# Patient Record
Sex: Female | Born: 1935 | Race: White | Hispanic: No | Smoking: Never smoker
Health system: Southern US, Community
[De-identification: ages and names within clinical notes are randomized; demographics above are authoritative.]

## PROBLEM LIST (undated history)

## (undated) DIAGNOSIS — K5792 Diverticulitis of intestine, part unspecified, without perforation or abscess without bleeding: Secondary | ICD-10-CM

## (undated) DIAGNOSIS — I4891 Unspecified atrial fibrillation: Secondary | ICD-10-CM

## (undated) DIAGNOSIS — F419 Anxiety disorder, unspecified: Secondary | ICD-10-CM

## (undated) DIAGNOSIS — I10 Essential (primary) hypertension: Secondary | ICD-10-CM

## (undated) DIAGNOSIS — Z124 Encounter for screening for malignant neoplasm of cervix: Secondary | ICD-10-CM

## (undated) DIAGNOSIS — Z0000 Encounter for general adult medical examination without abnormal findings: Secondary | ICD-10-CM

## (undated) DIAGNOSIS — F418 Other specified anxiety disorders: Secondary | ICD-10-CM

## (undated) DIAGNOSIS — Z9889 Other specified postprocedural states: Secondary | ICD-10-CM

## (undated) DIAGNOSIS — M1990 Unspecified osteoarthritis, unspecified site: Secondary | ICD-10-CM

## (undated) DIAGNOSIS — K5900 Constipation, unspecified: Secondary | ICD-10-CM

## (undated) DIAGNOSIS — M1712 Unilateral primary osteoarthritis, left knee: Secondary | ICD-10-CM

## (undated) DIAGNOSIS — D649 Anemia, unspecified: Secondary | ICD-10-CM

## (undated) DIAGNOSIS — E785 Hyperlipidemia, unspecified: Secondary | ICD-10-CM

## (undated) DIAGNOSIS — R112 Nausea with vomiting, unspecified: Secondary | ICD-10-CM

## (undated) DIAGNOSIS — R011 Cardiac murmur, unspecified: Secondary | ICD-10-CM

## (undated) DIAGNOSIS — E039 Hypothyroidism, unspecified: Secondary | ICD-10-CM

## (undated) HISTORY — DX: Encounter for general adult medical examination without abnormal findings: Z00.00

## (undated) HISTORY — PX: JOINT REPLACEMENT: SHX530

## (undated) HISTORY — DX: Cardiac murmur, unspecified: R01.1

## (undated) HISTORY — DX: Unspecified atrial fibrillation: I48.91

## (undated) HISTORY — PX: TONSILLECTOMY: SUR1361

## (undated) HISTORY — DX: Unilateral primary osteoarthritis, left knee: M17.12

## (undated) HISTORY — PX: GANGLION CYST EXCISION: SHX1691

## (undated) HISTORY — DX: Other specified anxiety disorders: F41.8

## (undated) HISTORY — PX: ABDOMINAL HYSTERECTOMY: SHX81

## (undated) HISTORY — DX: Constipation, unspecified: K59.00

## (undated) HISTORY — DX: Encounter for screening for malignant neoplasm of cervix: Z12.4

## (undated) HISTORY — DX: Hyperlipidemia, unspecified: E78.5

## (undated) HISTORY — DX: Essential (primary) hypertension: I10

---

## 1999-10-22 ENCOUNTER — Encounter (INDEPENDENT_AMBULATORY_CARE_PROVIDER_SITE_OTHER): Payer: Self-pay

## 1999-10-22 ENCOUNTER — Ambulatory Visit (HOSPITAL_COMMUNITY): Admission: RE | Admit: 1999-10-22 | Discharge: 1999-10-22 | Payer: Self-pay | Admitting: Gastroenterology

## 2002-10-18 ENCOUNTER — Encounter: Payer: Self-pay | Admitting: Internal Medicine

## 2002-10-18 ENCOUNTER — Encounter: Admission: RE | Admit: 2002-10-18 | Discharge: 2002-10-18 | Payer: Self-pay | Admitting: Internal Medicine

## 2003-03-18 ENCOUNTER — Encounter: Admission: RE | Admit: 2003-03-18 | Discharge: 2003-03-18 | Payer: Self-pay | Admitting: Internal Medicine

## 2003-08-22 ENCOUNTER — Encounter: Admission: RE | Admit: 2003-08-22 | Discharge: 2003-08-22 | Payer: Self-pay | Admitting: Internal Medicine

## 2004-01-22 ENCOUNTER — Encounter: Admission: RE | Admit: 2004-01-22 | Discharge: 2004-04-21 | Payer: Self-pay | Admitting: Internal Medicine

## 2004-01-22 ENCOUNTER — Ambulatory Visit: Payer: Self-pay | Admitting: Psychology

## 2004-03-12 ENCOUNTER — Encounter: Admission: RE | Admit: 2004-03-12 | Discharge: 2004-03-12 | Payer: Self-pay | Admitting: Internal Medicine

## 2004-12-16 ENCOUNTER — Ambulatory Visit (HOSPITAL_COMMUNITY): Admission: RE | Admit: 2004-12-16 | Discharge: 2004-12-16 | Payer: Self-pay | Admitting: Gastroenterology

## 2006-01-01 ENCOUNTER — Encounter: Admission: RE | Admit: 2006-01-01 | Discharge: 2006-01-01 | Payer: Self-pay | Admitting: Otolaryngology

## 2006-06-22 ENCOUNTER — Ambulatory Visit: Payer: Self-pay | Admitting: Vascular Surgery

## 2009-06-10 ENCOUNTER — Ambulatory Visit: Payer: Self-pay | Admitting: Diagnostic Radiology

## 2009-06-10 ENCOUNTER — Emergency Department (HOSPITAL_BASED_OUTPATIENT_CLINIC_OR_DEPARTMENT_OTHER): Admission: EM | Admit: 2009-06-10 | Discharge: 2009-06-10 | Payer: Self-pay | Admitting: Emergency Medicine

## 2010-03-15 ENCOUNTER — Encounter: Payer: Self-pay | Admitting: Sports Medicine

## 2010-04-23 HISTORY — PX: OTHER SURGICAL HISTORY: SHX169

## 2010-06-16 ENCOUNTER — Other Ambulatory Visit (HOSPITAL_COMMUNITY): Payer: Self-pay

## 2010-06-19 ENCOUNTER — Other Ambulatory Visit (HOSPITAL_COMMUNITY): Payer: Self-pay

## 2010-07-10 NOTE — Procedures (Signed)
[facility]  Patient:    GRACELYNN, BIRCHER                          MRN: [id number] Proc. Date: 10/22/99 Attending:  Verlin Grills, M.D. CC:         Barbette Hair. Vaughan Basta., M.D.   Procedure Report  PROCEDURE:  Colonoscopy and colon polyp biopsy.  REFERRING PHYSICIAN:  Dr. Ike Bene.  INDICATION FOR PROCEDURE:  Ms. Shariah Assad is a 76 year old female who is referred for screening colonoscopy to rule out colorectal neoplasia.  I discussed with the patient the complications associated with colonoscopy and polypectomy including a 15/1000 risk of bleeding and 05/998 risk of intestinal perforation requiring surgical repair. Ms. Bejarano has signed the operative permit.  ENDOSCOPIST:  Verlin Grills, M.D.  PREMEDICATION:  Versed 10 mg, Demerol 50 mg.  ENDOSCOPE:  Olympus pediatric colonoscope.  DESCRIPTION OF PROCEDURE:  After obtaining informed consent, the patient was placed in the left lateral decubitus position. I administered intravenous Demerol and intravenous Versed to achieve conscious sedation for the procedure. The patients cardiac rhythm, oxygen saturation and blood pressure were monitored throughout the procedure and documented in the medical record.  Anal inspection was normal. Digital rectal exam was normal. The Olympus pediatric video colonoscope was introduced into the rectum and under direct vision advanced to the cecum as identified by a normal appearing ileocecal valve. Colonic preparation for the exam today was excellent.  RECTUM:  Normal.  SIGMOID COLON/DESCENDING COLON:  Normal.  SPLENIC FLEXURE:  A 1 mm sessile polyp was removed with the cold biopsy forceps from the splenic flexure and submitted for pathological interpretation.  TRANSVERSE COLON:  Normal.  HEPATIC FLEXURE:  Normal.  ASCENDING COLON:  Normal.  CECUM/ILEOCECAL VALVE:  Normal.  ASSESSMENT:  A 1 mm sessile polyp was removed with the cold  biopsy forceps from the splenic flexure and submitted for pathological interpretation. Otherwise normal proctocolonoscopy to the cecum.  RECOMMENDATIONS:  If splenic flexure polyp returns neoplastic, Ms. Kuba should undergo a repeat colonoscopy in 5 years. If the splenic flexure polyp returns non-neoplastic, Ms. Sittner should undergo a repeat colonoscopy in 10 years. DD:  10/22/99 TD:  10/22/99 Job: [id number]

## 2010-07-10 NOTE — Op Note (Signed)
[name], Kalisz [name]                 ACCOUNT NO.:  [id number]   MEDICAL RECORD NO.:  [id number]          PATIENT TYPE:  AMB   LOCATION:  ENDO                         FACILITY:  [facility]   PHYSICIAN:  Danise Edge, M.D.   DATE OF BIRTH:  May 14, 1931   DATE OF PROCEDURE:  12/16/2004  DATE OF DISCHARGE:                                 OPERATIVE REPORT   PROCEDURE:  Surveillance colonoscopy.   PROCEDURE INDICATIONS:  [name] is a 77 year old female born [birth year]. Five years ago [name] underwent a screening colonoscopy and a 1  mm adenomatous polyp was removed.   ENDOSCOPIST:  Danise Edge, M.D.   PREMEDICATION:  Versed 7 mg, Demerol 50 mg.   PROCEDURE:  After obtaining informed consent, [name] was placed in the  left lateral decubitus position. I administered intravenous Demerol and  intravenous Versed to achieve conscious sedation for the procedure. The  patient's blood pressure, oxygen saturation and cardiac rhythm were  monitored throughout the procedure and documented in the medical record.   Anal inspection and digital rectal exam were normal. The Olympus adjustable  pediatric colonoscope was introduced into the rectum and advanced to the  cecum. A normal-appearing ileocecal valve and appendiceal orifice were  identified. Colonic preparation for the exam today was excellent.   RECTUM:  Normal. Retroflex view of the distal rectum normal.  SIGMOID COLON AND DESCENDING COLON:  Normal.  SPLENIC FLEXURE:  Normal.  TRANSVERSE COLON:  Normal.  HEPATIC FLEXURE:  Normal.  ASCENDING COLON:  Normal.  CECUM AND ILEOCECAL VALVE:  Normal.   ASSESSMENT:  Normal proctocolonoscopy to the cecum. No endoscopic evidence  for the presence of recurrent colorectal neoplasia.   RECOMMENDATIONS:  Repeat colonoscopy in 5 years.           ______________________________  Danise Edge, M.D.     MJ/MEDQ  D:  12/16/2004  T:  12/16/2004  Job:  [id number]

## 2010-08-04 ENCOUNTER — Other Ambulatory Visit (HOSPITAL_COMMUNITY): Payer: Self-pay

## 2010-08-12 ENCOUNTER — Inpatient Hospital Stay (HOSPITAL_COMMUNITY): Admission: RE | Admit: 2010-08-12 | Payer: Self-pay | Source: Ambulatory Visit | Admitting: Orthopedic Surgery

## 2010-10-23 ENCOUNTER — Other Ambulatory Visit (HOSPITAL_COMMUNITY): Payer: Self-pay

## 2010-10-24 HISTORY — PX: REPLACEMENT TOTAL KNEE: SUR1224

## 2010-10-27 ENCOUNTER — Encounter (HOSPITAL_COMMUNITY)
Admission: RE | Admit: 2010-10-27 | Discharge: 2010-10-27 | Disposition: A | Discharge status: Home / Self Care | Payer: Medicare Other | Source: Ambulatory Visit | Attending: Orthopedic Surgery | Admitting: Orthopedic Surgery

## 2010-10-27 ENCOUNTER — Other Ambulatory Visit (HOSPITAL_COMMUNITY): Payer: Self-pay | Admitting: Orthopedic Surgery

## 2010-10-27 ENCOUNTER — Ambulatory Visit (HOSPITAL_COMMUNITY)
Admission: RE | Admit: 2010-10-27 | Discharge: 2010-10-27 | Disposition: A | Discharge status: Home / Self Care | Payer: Medicare Other | Source: Ambulatory Visit | Attending: Orthopedic Surgery | Admitting: Orthopedic Surgery

## 2010-10-27 DIAGNOSIS — Z01811 Encounter for preprocedural respiratory examination: Secondary | ICD-10-CM

## 2010-10-27 LAB — TYPE AND SCREEN: Antibody Screen: NEGATIVE

## 2010-10-27 LAB — COMPREHENSIVE METABOLIC PANEL
ALT: 18 U/L (ref 0–35)
Albumin: 3.8 g/dL (ref 3.5–5.2)
Alkaline Phosphatase: 72 U/L (ref 39–117)
BUN: 10 mg/dL (ref 6–23)
Chloride: 100 mEq/L (ref 96–112)
Potassium: 4.2 mEq/L (ref 3.5–5.1)
Total Bilirubin: 0.9 mg/dL (ref 0.3–1.2)

## 2010-10-27 LAB — URINALYSIS, ROUTINE W REFLEX MICROSCOPIC
Hgb urine dipstick: NEGATIVE
Protein, ur: NEGATIVE mg/dL
Urobilinogen, UA: 0.2 mg/dL (ref 0.0–1.0)

## 2010-10-27 LAB — CBC
HCT: 39.6 % (ref 36.0–46.0)
Hemoglobin: 14 g/dL (ref 12.0–15.0)
MCH: 31.6 pg (ref 26.0–34.0)
MCHC: 35.4 g/dL (ref 30.0–36.0)
Platelets: 236 10*3/uL (ref 150–400)
RBC: 4.43 MIL/uL (ref 3.87–5.11)
WBC: 6.8 10*3/uL (ref 4.0–10.5)

## 2010-10-27 LAB — PROTIME-INR
INR: 0.94 (ref 0.00–1.49)
Prothrombin Time: 12.8 seconds (ref 11.6–15.2)

## 2010-10-27 LAB — URINE MICROSCOPIC-ADD ON

## 2010-10-27 LAB — SURGICAL PCR SCREEN: MRSA, PCR: NEGATIVE

## 2010-10-28 ENCOUNTER — Inpatient Hospital Stay (HOSPITAL_COMMUNITY)
Admission: RE | Admit: 2010-10-28 | Discharge: 2010-10-31 | DRG: 470 | Disposition: A | Discharge status: Skilled Nursing Facility | Payer: Medicare Other | Source: Ambulatory Visit | Attending: Orthopedic Surgery | Admitting: Orthopedic Surgery

## 2010-10-28 ENCOUNTER — Inpatient Hospital Stay (HOSPITAL_COMMUNITY): Payer: Medicare Other

## 2010-10-28 DIAGNOSIS — N390 Urinary tract infection, site not specified: Secondary | ICD-10-CM | POA: Diagnosis present

## 2010-10-28 DIAGNOSIS — I10 Essential (primary) hypertension: Secondary | ICD-10-CM | POA: Diagnosis present

## 2010-10-28 DIAGNOSIS — M1710 Unilateral primary osteoarthritis, unspecified knee: Principal | ICD-10-CM | POA: Diagnosis present

## 2010-10-28 DIAGNOSIS — E039 Hypothyroidism, unspecified: Secondary | ICD-10-CM | POA: Diagnosis present

## 2010-10-28 DIAGNOSIS — F341 Dysthymic disorder: Secondary | ICD-10-CM | POA: Diagnosis present

## 2010-10-28 DIAGNOSIS — Z01812 Encounter for preprocedural laboratory examination: Secondary | ICD-10-CM

## 2010-10-29 LAB — BASIC METABOLIC PANEL
BUN: 8 mg/dL (ref 6–23)
CO2: 30 mEq/L (ref 19–32)
Calcium: 8.3 mg/dL — ABNORMAL LOW (ref 8.4–10.5)
Chloride: 95 mEq/L — ABNORMAL LOW (ref 96–112)
GFR calc Af Amer: >60 mL/min (ref >60)
GFR calc non Af Amer: >60 mL/min (ref >60)
Glucose, Bld: 134 mg/dL — ABNORMAL HIGH (ref 70–99)
Glucose, Bld: 152 mg/dL — ABNORMAL HIGH (ref 70–99)
Potassium: 4 mEq/L (ref 3.5–5.1)
Potassium: 3.9 mEq/L (ref 3.5–5.1)
Sodium: 129 mEq/L — ABNORMAL LOW (ref 135–145)

## 2010-10-29 LAB — CBC
HCT: 31.4 % — ABNORMAL LOW (ref 36.0–46.0)
Hemoglobin: 10.8 g/dL — ABNORMAL LOW (ref 12.0–15.0)
MCH: 30.8 pg (ref 26.0–34.0)
MCV: 89.5 fL (ref 78.0–100.0)
Platelets: 195 10*3/uL (ref 150–400)
RBC: 3.51 MIL/uL — ABNORMAL LOW (ref 3.87–5.11)

## 2010-10-30 LAB — CBC
Hemoglobin: 9.6 g/dL — ABNORMAL LOW (ref 12.0–15.0)
MCH: 31 pg (ref 26.0–34.0)
Platelets: 163 10*3/uL (ref 150–400)
RBC: 3.1 MIL/uL — ABNORMAL LOW (ref 3.87–5.11)
WBC: 8.6 10*3/uL (ref 4.0–10.5)

## 2010-10-30 LAB — BASIC METABOLIC PANEL
BUN: 8 mg/dL (ref 6–23)
Chloride: 94 mEq/L — ABNORMAL LOW (ref 96–112)
GFR calc Af Amer: >60 mL/min (ref >60)
GFR calc non Af Amer: >60 mL/min (ref >60)
Potassium: 3.7 mEq/L (ref 3.5–5.1)
Sodium: 131 mEq/L — ABNORMAL LOW (ref 135–145)

## 2010-10-30 LAB — URINE CULTURE
Colony Count: 30000
Culture  Setup Time: 201209051114

## 2010-10-30 LAB — PROTIME-INR
INR: 2.29 — ABNORMAL HIGH (ref 0.00–1.49)
Prothrombin Time: 25.6 seconds — ABNORMAL HIGH (ref 11.6–15.2)

## 2010-10-31 LAB — CBC
HCT: 27.8 % — ABNORMAL LOW (ref 36.0–46.0)
Hemoglobin: 9.8 g/dL — ABNORMAL LOW (ref 12.0–15.0)
MCHC: 35.3 g/dL (ref 30.0–36.0)
WBC: 8.1 10*3/uL (ref 4.0–10.5)

## 2010-10-31 LAB — BASIC METABOLIC PANEL
Calcium: 8.5 mg/dL (ref 8.4–10.5)
Chloride: 97 mEq/L (ref 96–112)
Creatinine, Ser: 0.58 mg/dL (ref 0.50–1.10)
GFR calc Af Amer: >60 mL/min (ref >60)
Sodium: 135 mEq/L (ref 135–145)

## 2010-10-31 LAB — PROTIME-INR
INR: 1.78 — ABNORMAL HIGH (ref 0.00–1.49)
Prothrombin Time: 21 seconds — ABNORMAL HIGH (ref 11.6–15.2)

## 2010-11-03 NOTE — Op Note (Signed)
NAMELAKAYA, TOLEN                 ACCOUNT NO.:  [id number]  MEDICAL RECORD NO.:  [id number]  LOCATION:  5031                         FACILITY:  [facility]  PHYSICIAN:  Loreta Ave, M.D. DATE OF BIRTH:  [birth year]  DATE OF PROCEDURE:  10/28/2010 DATE OF DISCHARGE:                              OPERATIVE REPORT   PREOPERATIVE DIAGNOSES: 1. 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 2. Left knee end-stage degenerative arthritis with valgus deformity.  POSTOPERATIVE DIAGNOSES: 1. 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 2. Left knee end-stage degenerative arthritis with valgus deformity. 3. Moderate-to-relatively marked osteopenia, proximal tibia on the     right.  PROCEDURE: 1. Right knee modified minimally invasive total knee replacement with     Stryker triathlon prosthesis.  Soft tissue balancing.  Cemented     pegged posterior stabilized #4 femoral component.  Cemented #4     tibial component with a 100-mm x 12-mm stem.  9-mm polyethylene     insert.  Cemented 35-mm patellar component. 2. Left knee intraarticular injection of Depo-Medrol and Marcaine.  SURGEON:  Loreta Ave, MD  ASSISTANT:  Zonia Kief, PA, present throughout the entire case and necessary for timely completion of procedure.  ANESTHESIA:  General  BLOOD LOSS:  Minimal.  SPECIMENS:  None.  CULTURES:  None.  COMPLICATIONS:  None.  PROCEDURE:  Soft compressive.  TOURNIQUET TIME:  1 hour 20 minutes on the right.  Hemovac x1 on the right.  DESCRIPTION OF PROCEDURE:  The patient was brought to the operating room, placed on the operating table in supine position.  After adequate anesthesia by obtained, both knees examined.  Both knees have increased valgus more than 10 degrees without lock correction.  Mild flexion contracture, flexion 100 degrees on both sides.  Under sterile technique, the left knee was injected intra-articularly with  Depo-Medrol and Marcaine.  Attention turned to the right.  Tourniquet applied. Prepped and draped in usual sterile fashion.  Exsanguinated with elevation and  Esmarch, tourniquet inflated to 350 mmHg.  Straight incision above the patella down to the tibial tubercle.  Medial arthrotomy vastus splitting preserving quad tendon.  Knee exposed. Remnants of menisci periarticular spurs, cruciate ligaments excised. Bone loss lateral femoral condyle, lateral tibial plateau producing valgus deformity.  I freed up scar tissue adhesions laterally.  Distal femur exposed.  Intramedullary guide placed.  Distal cut 10 mm set at 5 degrees of valgus.  Using epicondylar axis, the femur was sized, cut, and fitted for posterior stabilized peg #4 component.  Proximal tibial resection extramedullary guide with a 0-degree cut.  The bone in the proximal tibia found to be extremely osteoporotic more so than the femur.  After being sure my cuts were balanced in flexion and extension. I then prepared the tibia for the #4 component.  I put trials above and below the 9-mm insert.  After clearing out all debris and clearing up adhesions laterally and appropriate cuts, this gave me a nicely balanced knee.  Good mechanical axis, good flexion and extension.  Tibia was marked for rotation.  Tibia was then prepared for a keel and a non- cemented 100-mm stem that I utilized  to try to offset forces because the degree of osteopenia.  Once I was prepared, all trials were removed. Patella exposed, posterior 10 mm removed.  Size drilled and fitted for a 35-mm component.  All trials removed.  Copious irrigation with pulse irrigating device.  Cement prepared and placed on all components which were firmly seated.  Polyethylene attached to tibia, knee reduced. Patella held with clamp.  Once cement hardened, we examined the knee. Good alignment, good stability, good patellofemoral tracking, and good balancing, flexion, and extension  confirmed.  Wound irrigated once again.  Hemovac placed and brought out through a separate stab wound. Arthrotomy closed with #1 Vicryl.  Skin and subcutaneous tissue with Vicryl and staples.  Sterile compressive dressing applied.  Tourniquet deinflated and removed.  Knee immobilizer applied.  Anesthesia reversed. Brought to recovery room.  Tolerated the surgery well.  No complications.     Loreta Ave, M.D.     DFM/MEDQ  D:  10/28/2010  T:  10/28/2010  Job:  [id number]  Electronically Signed by Mckinley Jewel M.D. on 11/03/2010 02:47:40 PM

## 2010-12-09 NOTE — Discharge Summary (Signed)
Alyssa Williamson, Alyssa Williamson                 ACCOUNT NO.:  [id number]  MEDICAL RECORD NO.:  [id number]  LOCATION:  5031                         FACILITY:  [facility]  PHYSICIAN:  Loreta Ave, M.D. DATE OF BIRTH:  06/07/1931  DATE OF ADMISSION:  10/28/2010 DATE OF DISCHARGE:  10/31/2010                              DISCHARGE SUMMARY   FINAL DIAGNOSES: 1. Status post right total knee replacement for end-stage degenerative     joint disease. 2. End-stage degenerative joint disease left knee with Marcaine/Depo-     Medrol injection. 3. Hypertension. 4. Depression/anxiety. 5. Hypothyroid. 6. Urinary tract infection.  HISTORY OF PRESENT ILLNESS:  A 77 year old white female with history of bilateral knee pain secondary to end-stage degenerative joint disease right greater left presented to our office for preop evaluation for right total knee replacement.  She had progressive worsening pain with no response with conservative treatment.  Significant decrease in her daily activities due to the ongoing complaint.  HOSPITAL COURSE:  On October 28, 2010, the patient was taken to the [facility] OR and a right total knee replacement and left knee intra- articular Marcaine/Depo-Medrol injection performed.  Surgeon is Loreta Ave, M.D. and assistant is Genene Churn. Denton Meek.  Anesthesia general.  Minimal blood loss.  No specimens or cultures.  One Hemovac drain placed.  No surgical or anesthesia complications and the patient was transferred to recovery in stable condition.  Tourniquet time was 1 hour and 20 minutes.  After the patient was stable in recovery, she was then transferred to the Orthopedic Unit.  On October 29, 2010, the patient was doing well with good pain control. Temp 98.7, pulse 68, respirations 18, and blood pressure 144/56.  Right knee dressing is clean, dry, and intact.  Calf nontender. Neurovascularly intact.  Warm and dry.  Discontinued morphine PCA. PT/OT consults.   Waiting transfer to [facility] skilled nursing facility for rehab.  Pharmacy protocol Coumadin and Lovenox started for DVT prophylaxis.  On October 30, 2010, the patient was doing well, again with good pain control.  She did have some itching with Norco and this was discontinued.  Started Ultram.  Temp 98.9, pulse 71, respirations 18, and blood pressure 125/49.  Urine culture was positive for UTI and Cipro 500 mg 1 tab p.o. b.i.d. started.  The patient was doing well and awaiting transfer to [facility] for rehab.  Right knee wound looks good. Staples intact.  No drainage or sign of infection.  Calf nontender. Neurovascularly intact.  Skin warm and dry.  Sodium 131, potassium 3.7, chloride 94, CO2 of 30, glucose 117, BUN 8, creatinine 0.48, and INR 2.29.  WBC 8.6, hemoglobin 9.6, hematocrit 27.3, and platelets 163.  DISCHARGE MEDICATIONS: 1. Ultram 50 mg 1 tab p.o. q.6 h. for pain. 2. Robaxin 500 mg 1 tab p.o. q.6 h. for spasms. 3. Coumadin pharmacy protocol.  Maintain INR 2-3 times 4 weeks postop     DVT prophylaxis. 4. Amlodipine/atorvastatin 10/40 mg 1 tab p.o. daily. 5. HCTZ 25 mg 1 tab p.o. daily. 6. Claritin 10 mg 1 tab p.o. daily. 7. Citalopram 20 mg 1 tab p.o. daily. 8. Alprazolam 0.5 mg 1 tab p.o. at  bedtime as needed. 9. Colace 100 mg p.o. b.i.d.  CONDITION:  Good and stable.  DISPOSITION:  Transfer to [facility] for rehab.  INSTRUCTIONS:  While at the skilled nursing facility, the patient will continue PT and OT to improve ambulation and knee range of motion and strengthening.  Weightbear as tolerated.  Can wean out of knee immobilizer per protocol.  Wean from walker to a cane as tolerated.  The patient is okay to shower, but no tub soaking.  Apply 4x4 gauze and apply TED hose over this daily.  Do not apply any creams or ointments to her incision.  Continue Coumadin x4 weeks postop for DVT prophylaxis. Should continue Cipro 500 mg 1 tablet p.o. b.i.d. x6 days.  Follow  up with Dr. Eulah Pont when she is 2 weeks postop for recheck and we will remove knee staples at that time.     Genene Churn. Denton Meek.   ______________________________ Loreta Ave, M.D.    JMO/MEDQ  D:  10/30/2010  T:  10/30/2010  Job:  [id number]  Electronically Signed by Zonia Kief P.A. on 12/02/2010 03:45:46 PM Electronically Signed by Mckinley Jewel M.D. on 12/09/2010 02:03:21 PM

## 2011-03-18 DIAGNOSIS — R059 Cough, unspecified: Secondary | ICD-10-CM | POA: Diagnosis not present

## 2011-03-18 DIAGNOSIS — R093 Abnormal sputum: Secondary | ICD-10-CM | POA: Diagnosis not present

## 2011-03-18 DIAGNOSIS — R609 Edema, unspecified: Secondary | ICD-10-CM | POA: Diagnosis not present

## 2011-03-18 DIAGNOSIS — R05 Cough: Secondary | ICD-10-CM | POA: Diagnosis not present

## 2011-03-29 DIAGNOSIS — R3911 Hesitancy of micturition: Secondary | ICD-10-CM | POA: Diagnosis not present

## 2011-03-29 DIAGNOSIS — R609 Edema, unspecified: Secondary | ICD-10-CM | POA: Diagnosis not present

## 2011-04-21 DIAGNOSIS — M24873 Other specific joint derangements of unspecified ankle, not elsewhere classified: Secondary | ICD-10-CM | POA: Diagnosis not present

## 2011-04-22 DIAGNOSIS — E7800 Pure hypercholesterolemia, unspecified: Secondary | ICD-10-CM | POA: Diagnosis not present

## 2011-04-22 DIAGNOSIS — R059 Cough, unspecified: Secondary | ICD-10-CM | POA: Diagnosis not present

## 2011-04-22 DIAGNOSIS — H6120 Impacted cerumen, unspecified ear: Secondary | ICD-10-CM | POA: Diagnosis not present

## 2011-04-22 DIAGNOSIS — R609 Edema, unspecified: Secondary | ICD-10-CM | POA: Diagnosis not present

## 2011-04-22 DIAGNOSIS — I10 Essential (primary) hypertension: Secondary | ICD-10-CM | POA: Diagnosis not present

## 2011-04-22 DIAGNOSIS — R05 Cough: Secondary | ICD-10-CM | POA: Diagnosis not present

## 2011-04-22 DIAGNOSIS — E039 Hypothyroidism, unspecified: Secondary | ICD-10-CM | POA: Diagnosis not present

## 2011-05-07 ENCOUNTER — Other Ambulatory Visit (HOSPITAL_COMMUNITY): Payer: Medicare Other

## 2011-05-07 DIAGNOSIS — R350 Frequency of micturition: Secondary | ICD-10-CM | POA: Diagnosis not present

## 2011-05-07 DIAGNOSIS — N390 Urinary tract infection, site not specified: Secondary | ICD-10-CM | POA: Diagnosis not present

## 2011-05-12 ENCOUNTER — Inpatient Hospital Stay: Admit: 2011-05-12 | Payer: Self-pay | Admitting: Orthopedic Surgery

## 2011-05-12 SURGERY — ARTHROPLASTY, KNEE, TOTAL
Anesthesia: General | Laterality: Left

## 2011-05-19 DIAGNOSIS — N390 Urinary tract infection, site not specified: Secondary | ICD-10-CM | POA: Diagnosis not present

## 2011-05-25 DIAGNOSIS — I10 Essential (primary) hypertension: Secondary | ICD-10-CM | POA: Diagnosis not present

## 2011-05-25 DIAGNOSIS — R609 Edema, unspecified: Secondary | ICD-10-CM | POA: Diagnosis not present

## 2011-05-31 ENCOUNTER — Emergency Department (HOSPITAL_BASED_OUTPATIENT_CLINIC_OR_DEPARTMENT_OTHER)
Admission: EM | Admit: 2011-05-31 | Discharge: 2011-06-01 | Disposition: A | Discharge status: Home / Self Care | Payer: Medicare Other | Attending: Emergency Medicine | Admitting: Emergency Medicine

## 2011-05-31 ENCOUNTER — Encounter (HOSPITAL_BASED_OUTPATIENT_CLINIC_OR_DEPARTMENT_OTHER): Payer: Self-pay | Admitting: *Deleted

## 2011-05-31 ENCOUNTER — Emergency Department (INDEPENDENT_AMBULATORY_CARE_PROVIDER_SITE_OTHER): Payer: Medicare Other

## 2011-05-31 DIAGNOSIS — B349 Viral infection, unspecified: Secondary | ICD-10-CM

## 2011-05-31 DIAGNOSIS — Z8739 Personal history of other diseases of the musculoskeletal system and connective tissue: Secondary | ICD-10-CM | POA: Diagnosis not present

## 2011-05-31 DIAGNOSIS — Z79899 Other long term (current) drug therapy: Secondary | ICD-10-CM | POA: Insufficient documentation

## 2011-05-31 DIAGNOSIS — E039 Hypothyroidism, unspecified: Secondary | ICD-10-CM | POA: Insufficient documentation

## 2011-05-31 DIAGNOSIS — R112 Nausea with vomiting, unspecified: Secondary | ICD-10-CM | POA: Diagnosis not present

## 2011-05-31 DIAGNOSIS — K529 Noninfective gastroenteritis and colitis, unspecified: Secondary | ICD-10-CM

## 2011-05-31 DIAGNOSIS — R1032 Left lower quadrant pain: Secondary | ICD-10-CM | POA: Diagnosis not present

## 2011-05-31 DIAGNOSIS — R1031 Right lower quadrant pain: Secondary | ICD-10-CM | POA: Diagnosis not present

## 2011-05-31 DIAGNOSIS — K5289 Other specified noninfective gastroenteritis and colitis: Secondary | ICD-10-CM | POA: Diagnosis not present

## 2011-05-31 DIAGNOSIS — R030 Elevated blood-pressure reading, without diagnosis of hypertension: Secondary | ICD-10-CM

## 2011-05-31 DIAGNOSIS — B9789 Other viral agents as the cause of diseases classified elsewhere: Secondary | ICD-10-CM | POA: Diagnosis not present

## 2011-05-31 HISTORY — DX: Diverticulitis of intestine, part unspecified, without perforation or abscess without bleeding: K57.92

## 2011-05-31 HISTORY — DX: Unspecified osteoarthritis, unspecified site: M19.90

## 2011-05-31 HISTORY — DX: Hypothyroidism, unspecified: E03.9

## 2011-05-31 LAB — URINALYSIS, ROUTINE W REFLEX MICROSCOPIC
Bilirubin Urine: NEGATIVE
Glucose, UA: NEGATIVE mg/dL
Hgb urine dipstick: NEGATIVE
Ketones, ur: NEGATIVE mg/dL
Protein, ur: NEGATIVE mg/dL

## 2011-05-31 LAB — DIFFERENTIAL
Basophils Absolute: 0 10*3/uL (ref 0.0–0.1)
Lymphocytes Relative: 19 % (ref 12–46)
Lymphs Abs: 1.4 10*3/uL (ref 0.7–4.0)
Neutro Abs: 4.7 10*3/uL (ref 1.7–7.7)

## 2011-05-31 LAB — COMPREHENSIVE METABOLIC PANEL
ALT: 15 U/L (ref 0–35)
AST: 21 U/L (ref 0–37)
Albumin: 3.7 g/dL (ref 3.5–5.2)
Alkaline Phosphatase: 84 U/L (ref 39–117)
CO2: 28 mEq/L (ref 19–32)
Chloride: 100 mEq/L (ref 96–112)
Potassium: 3.5 mEq/L (ref 3.5–5.1)
Sodium: 138 mEq/L (ref 135–145)
Total Bilirubin: 0.4 mg/dL (ref 0.3–1.2)

## 2011-05-31 LAB — CBC
Platelets: 256 10*3/uL (ref 150–400)
RBC: 4.34 MIL/uL (ref 3.87–5.11)
RDW: 12.9 % (ref 11.5–15.5)
WBC: 7.3 10*3/uL (ref 4.0–10.5)

## 2011-05-31 LAB — LIPASE, BLOOD: Lipase: 25 U/L (ref 11–59)

## 2011-05-31 MED ORDER — IOHEXOL 300 MG/ML  SOLN: 20.0000 mL | INTRAMUSCULAR | Status: AC

## 2011-05-31 MED ORDER — IOHEXOL 300 MG/ML  SOLN
100.0000 mL | Freq: Once | INTRAMUSCULAR | Status: AC | PRN
  Administered 2011-05-31: 100 mL via INTRAVENOUS

## 2011-05-31 MED ORDER — SODIUM CHLORIDE 0.9 % IV SOLN: INTRAVENOUS | Status: DC

## 2011-05-31 MED ORDER — ONDANSETRON HCL 4 MG/2ML IJ SOLN
4.0000 mg | Freq: Once | INTRAMUSCULAR | Status: AC
  Administered 2011-05-31: 4 mg via INTRAVENOUS
  Filled 2011-05-31: qty 2

## 2011-05-31 MED ORDER — SODIUM CHLORIDE 0.9 % IV BOLUS (SEPSIS)
500.0000 mL | Freq: Once | INTRAVENOUS | Status: AC
  Administered 2011-05-31: 500 mL via INTRAVENOUS

## 2011-05-31 MED ORDER — FAMOTIDINE IN NACL 20-0.9 MG/50ML-% IV SOLN
20.0000 mg | Freq: Once | INTRAVENOUS | Status: AC
  Administered 2011-05-31: 20 mg via INTRAVENOUS
  Filled 2011-05-31: qty 50

## 2011-05-31 NOTE — ED Notes (Signed)
Pt reports right side abd pain x 1 week- vomited after dinner tonight- hx of diverticulitis

## 2011-05-31 NOTE — ED Provider Notes (Signed)
History   This chart was scribed for Alyssa Bonier, MD by Sofie Rower. The patient was seen in room MH11/MH11 and the patient's care was started at 8:48 PM     CSN: [id number]  Arrival date & time 05/31/11  1927   First MD Initiated Contact with Patient 05/31/11 2030      Chief Complaint  Patient presents with  . Abdominal Pain  . Emesis    (Consider location/radiation/quality/duration/timing/severity/associated sxs/prior treatment) HPI  Alyssa Williamson is a 78 y.o. female who presents to the Emergency Department complaining of moderate, constant abdominal pain located on the right lower quadrant onset one week ago with associated symptoms of vomiting, constipation, radiating back pain. The pt states "she ate dinner at 4:30PM and threw everything back up at 5:30PM." The patient informs the EDP that "these symptoms are similar to that of when she had diverticulitis." The patient rates the pain 4/10 at worst. Modifying factors include ciprofloxacin. Pt has a hx of diverticulitis, arthritis, hypothyroidism, bladder tack (1 year ago in March), hysterectomy.   Pt denies diarrhea, blood in stool, dysuria, hematuria, removal of gall bladder or appendix.   PCP is Dr. Wardell Heath.   Past Medical History  Diagnosis Date  . Diverticulitis   . Arthritis   . Hypothyroid     Past Surgical History  Procedure Date  . Replacement total knee   . Bladder tack   . Abdominal hysterectomy     No family history on file.  History  Substance Use Topics  . Smoking status: Never Smoker   . Smokeless tobacco: Never Used  . Alcohol Use: Yes     rare    OB History    Grav Para Term Preterm Abortions TAB SAB Ect Mult Living                  Review of Systems  All other systems reviewed and are negative.    10 Systems reviewed and all are negative for acute change except as noted in the HPI.    Allergies  Codeine  Home Medications   Current Outpatient Rx  Name Route Sig Dispense  Refill  . ALPRAZOLAM 0.5 MG PO TABS Oral Take 0.5 mg by mouth at bedtime as needed.    Marland Kitchen AMLODIPINE BESYLATE 10 MG PO TABS Oral Take 10 mg by mouth daily.    . ATORVASTATIN CALCIUM 40 MG PO TABS Oral Take 40 mg by mouth daily.    Marland Kitchen CIPROFLOXACIN HCL 100 MG PO TABS Oral Take 100 mg by mouth 2 (two) times daily.    Marland Kitchen CITALOPRAM HYDROBROMIDE 20 MG PO TABS Oral Take 20 mg by mouth daily.    Marland Kitchen BENADRYL ALLERGY PO Oral Take 1 tablet by mouth daily as needed. Patient is taking this medication for allergies.    . FUROSEMIDE 40 MG PO TABS Oral Take 40 mg by mouth daily.    Marland Kitchen LEVOTHYROXINE SODIUM 75 MCG PO TABS Oral Take 75 mcg by mouth daily.    . CENTRUM SILVER PO Oral Take 1 tablet by mouth daily.    Marland Kitchen VERAPAMIL HCL ER 180 MG PO TBCR Oral Take 180 mg by mouth at bedtime.      BP 158/59  Pulse 65  Temp(Src) 97.9 F (36.6 C) (Oral)  Resp 18  Ht 5\' 2"  (1.575 m)  Wt 180 lb (81.647 kg)  BMI 32.92 kg/m2  SpO2 97%  Physical Exam  Nursing note and vitals reviewed. Constitutional: She appears well-developed  and well-nourished.  HENT:  Right Ear: External ear normal.  Left Ear: External ear normal.  Nose: Nose normal.  Mouth/Throat: Oropharynx is clear and moist.  Eyes: No scleral icterus.  Cardiovascular: Normal rate, regular rhythm and normal heart sounds.  Exam reveals no gallop and no friction rub.   No murmur heard. Pulmonary/Chest: Effort normal and breath sounds normal. No respiratory distress. She has no wheezes. She has no rales.  Abdominal: Soft. Bowel sounds are normal. There is tenderness (RLQ). There is no rebound and no guarding.       No flank tenderness on either side.   Musculoskeletal: Normal range of motion.  Neurological: She is alert.  Skin: Skin is warm and dry.  Psychiatric: She has a normal mood and affect. Her behavior is normal.    ED Course  Procedures (including critical care time)  DIAGNOSTIC STUDIES: Oxygen Saturation is 97% on room air, normal by my  interpretation.    COORDINATION OF CARE:    Results for orders placed during the hospital encounter of 05/31/11  CBC      Component Value Range   WBC 7.3  4.0 - 10.5 (K/uL)   RBC 4.34  3.87 - 5.11 (MIL/uL)   Hemoglobin 13.6  12.0 - 15.0 (g/dL)   HCT 16.1  09.6 - 04.5 (%)   MCV 87.8  78.0 - 100.0 (fL)   MCH 31.3  26.0 - 34.0 (pg)   MCHC 35.7  30.0 - 36.0 (g/dL)   RDW 40.9  81.1 - 91.4 (%)   Platelets 256  150 - 400 (K/uL)  DIFFERENTIAL      Component Value Range   Neutrophils Relative 64  43 - 77 (%)   Neutro Abs 4.7  1.7 - 7.7 (K/uL)   Lymphocytes Relative 19  12 - 46 (%)   Lymphs Abs 1.4  0.7 - 4.0 (K/uL)   Monocytes Relative 13 (*) 3 - 12 (%)   Monocytes Absolute 1.0  0.1 - 1.0 (K/uL)   Eosinophils Relative 3  0 - 5 (%)   Eosinophils Absolute 0.2  0.0 - 0.7 (K/uL)   Basophils Relative 0  0 - 1 (%)   Basophils Absolute 0.0  0.0 - 0.1 (K/uL)  LIPASE, BLOOD      Component Value Range   Lipase 25  11 - 59 (U/L)  COMPREHENSIVE METABOLIC PANEL      Component Value Range   Sodium 138  135 - 145 (mEq/L)   Potassium 3.5  3.5 - 5.1 (mEq/L)   Chloride 100  96 - 112 (mEq/L)   CO2 28  19 - 32 (mEq/L)   Glucose, Bld 92  70 - 99 (mg/dL)   BUN 15  6 - 23 (mg/dL)   Creatinine, Ser 7.82  0.50 - 1.10 (mg/dL)   Calcium 9.4  8.4 - 95.6 (mg/dL)   Total Protein 7.3  6.0 - 8.3 (g/dL)   Albumin 3.7  3.5 - 5.2 (g/dL)   AST 21  0 - 37 (U/L)   ALT 15  0 - 35 (U/L)   Alkaline Phosphatase 84  39 - 117 (U/L)   Total Bilirubin 0.4  0.3 - 1.2 (mg/dL)   GFR calc non Af Amer 47 (*) >90 (mL/min)   GFR calc Af Amer 54 (*) >90 (mL/min)  URINALYSIS, ROUTINE W REFLEX MICROSCOPIC      Component Value Range   Color, Urine YELLOW  YELLOW    APPearance CLOUDY (*) CLEAR    Specific Gravity, Urine  1.012  1.005 - 1.030    pH 6.5  5.0 - 8.0    Glucose, UA NEGATIVE  NEGATIVE (mg/dL)   Hgb urine dipstick NEGATIVE  NEGATIVE    Bilirubin Urine NEGATIVE  NEGATIVE    Ketones, ur NEGATIVE  NEGATIVE (mg/dL)    Protein, ur NEGATIVE  NEGATIVE (mg/dL)   Urobilinogen, UA 0.2  0.0 - 1.0 (mg/dL)   Nitrite NEGATIVE  NEGATIVE    Leukocytes, UA NEGATIVE  NEGATIVE    Ct Abdomen Pelvis W Contrast  05/31/2011  *RADIOLOGY REPORT*  Clinical Data: Pain.  Nausea and vomiting.  High blood pressure. History of bladder tacking hysterectomy.  CT ABDOMEN AND PELVIS WITH CONTRAST  Technique:  Multidetector CT imaging of the abdomen and pelvis was performed following the standard protocol during bolus administration of intravenous contrast.  Contrast: OMNIPAQUE IOHEXOL 300 MG/ML  SOLN  Comparison: None.  Findings: The appendix is not clearly visualized.  No inflammatory process is seen within the right lower quadrant of the abdomen. Artifact causes appearance of subtle fluid along the inferior aspect of the liver.  Sigmoid colon with minimal haziness of surrounding fat planes but without definitive findings of colitis.  No free intraperitoneal air.  No focal hepatic, splenic, pancreatic, renal or adrenal lesion.  No calcified gallstones.  If primary gallbladder abnormality is of concern ultrasound may be considered.  Noncontrast filled views of the urinary bladder unremarkable.  Post hysterectomy.  Degenerative changes lower lumbar spine.  Small sclerotic foci right hip and right ischium may be bone islands.  Mild atherosclerotic type changes of the aorta and branch vessels without abdominal aortic aneurysm.  IMPRESSION: The appendix is not clearly visualized.  No inflammatory process is seen within the right lower quadrant of the abdomen.  Sigmoid colon with minimal haziness of surrounding fat planes but without definitive findings of colitis.  Original Report Authenticated By: Fuller Canada, M.D.      No diagnosis found.  8:52PM- EDP at bedside discusses treatment plan.   MDM  The patient has apparent gastroenteritis, likely viral as this is epidemic in our region at this time. Her symptoms at this time have resolved.  There is no acute finding to suggest acute pathology of liver, pancreas, enteric system, kidneys, urine, appendix, specifically no diverticulitis. I will discharge the patient home with antiemetic prescription to use as needed if symptoms return. The patient states her understanding of and agreement with the plan of care.     I personally performed the services described in this documentation, which was scribed in my presence. The recorded information has been reviewed and considered.\    Alyssa Bonier, MD 06/01/11 0001

## 2011-06-01 MED ORDER — ONDANSETRON HCL 8 MG PO TABS: 8.0000 mg | ORAL_TABLET | Freq: Three times a day (TID) | ORAL | Status: AC | PRN

## 2011-06-01 NOTE — Discharge Instructions (Signed)
Abdominal Pain (Nonspecific) Your exam might not show the exact reason you have abdominal pain. Since there are many different causes of abdominal pain, another checkup and more tests may be needed. It is very important to follow up for lasting (persistent) or worsening symptoms. A possible cause of abdominal pain in any person who still has his or her appendix is acute appendicitis. Appendicitis is often hard to diagnose. Normal blood tests, urine tests, ultrasound, and CT scans do not completely rule out early appendicitis or other causes of abdominal pain. Sometimes, only the changes that happen over time will allow appendicitis and other causes of abdominal pain to be determined. Other potential problems that may require surgery may also take time to become more apparent. Because of this, it is important that you follow all of the instructions below. HOME CARE INSTRUCTIONS   Rest as much as possible.   Do not eat solid food until your pain is gone.   While adults or children have pain: A diet of water, weak decaffeinated tea, broth or bouillon, gelatin, oral rehydration solutions (ORS), frozen ice pops, or ice chips may be helpful.   When pain is gone in adults or children: Start a light diet (dry toast, crackers, applesauce, or white rice). Increase the diet slowly as long as it does not bother you. Eat no dairy products (including cheese and eggs) and no spicy, fatty, fried, or high-fiber foods.   Use no alcohol, caffeine, or cigarettes.   Take your regular medicines unless your caregiver told you not to.   Take any prescribed medicine as directed.   Only take over-the-counter or prescription medicines for pain, discomfort, or fever as directed by your caregiver. Do not give aspirin to children.  If your caregiver has given you a follow-up appointment, it is very important to keep that appointment. Not keeping the appointment could result in a permanent injury and/or lasting (chronic) pain  and/or disability. If there is any problem keeping the appointment, you must call to reschedule.  SEEK IMMEDIATE MEDICAL CARE IF:   Your pain is not gone in 24 hours.   Your pain becomes worse, changes location, or feels different.   You or your child has an oral temperature above 102 F (38.9 C), not controlled by medicine.   Your baby is older than 3 months with a rectal temperature of 102 F (38.9 C) or higher.   Your baby is 64 months old or younger with a rectal temperature of 100.4 F (38 C) or higher.   You have shaking chills.   You keep throwing up (vomiting) or cannot drink liquids.   There is blood in your vomit or you see blood in your bowel movements.   Your bowel movements become dark or black.   You have frequent bowel movements.   Your bowel movements stop (become blocked) or you cannot pass gas.   You have bloody, frequent, or painful urination.   You have yellow discoloration in the skin or whites of the eyes.   Your stomach becomes bloated or bigger.   You have dizziness or fainting.   You have chest or back pain.  MAKE SURE YOU:   Understand these instructions.   Will watch your condition.   Will get help right away if you are not doing well or get worse.  Document Released: 02/08/2005 Document Revised: 01/28/2011 Document Reviewed: 01/06/2009 [facility] Patient Information 2012 [place], [place].Clear Liquid Diet The clear liquid dietconsists of foods that are liquid or will  become liquid at room temperature.You should be able to see through the liquid and beverages. Examples of foods allowed on a clear liquid diet include fruit juice, broth or bouillon, gelatin, or frozen ice pops. The purpose of this diet is to provide necessary fluid, electrolytes such as sodium and potassium, and energy to keep the body functioning during times when you are not able to consume a regular diet.A clear liquid diet should not be continued for long periods of time as  it is not nutritionally adequate.  REASONS FOR USING A CLEAR LIQUID DIET  In sudden onset (acute) conditions for a patient before or after surgery.   As the first step in oral feeding.   For fluid and electrolyte replacement in diarrheal diseases.   As a diet before certain medical tests are performed.  ADEQUACY The clear liquid diet is adequate only in ascorbic acid, according to the Recommended Dietary Allowances of the [employer]. CHOOSING FOODS Breads and Starches  Allowed:  None are allowed.   Avoid: All are avoided.  Vegetables  Allowed:  Strained tomato or vegetable juice.   Avoid: Any others.  Fruit  Allowed:  Strained fruit juices and fruit drinks. Include 1 serving of citrus or vitamin C-enriched fruit juice daily.   Avoid: Any others.  Meat and Meat Substitutes  Allowed:  None are allowed.   Avoid: All are avoided.  Milk  Allowed:  None are allowed.   Avoid: All are avoided.  Soups and Combination Foods  Allowed:  Clear bouillon, broth, or strained broth-based soups.   Avoid: Any others.  Desserts and Sweets  Allowed:  Sugar, honey. High protein gelatin. Flavored gelatin, ices, or frozen ice pops that do not contain milk.   Avoid: Any others.  Fats and Oils  Allowed:  None are allowed.   Avoid: All are avoided.  Beverages  Allowed: Cereal beverages, coffee (regular or decaffeinated), tea, or soda at the discretion of your caregiver.   Avoid: Any others.  Condiments  Allowed:  Iodized salt.   Avoid: Any others, including pepper.  Supplements  Allowed:  Liquid nutrition beverages.   Avoid: Any others that contain lactose or fiber.  SAMPLE MEAL PLAN Breakfast  4 oz (120 mL) strained orange juice.    to 1 cup (125 to 250 mL) gelatin (plain or fortified).   1 cup (250 mL) beverage (coffee or tea).   Sugar, if desired.  Midmorning Snack   cup (125 mL) gelatin (plain or fortified).  Lunch  1 cup (250 mL) broth or  consomm.   4 oz (120 mL) strained grapefruit juice.    cup (125 mL) gelatin (plain or fortified).   1 cup (250 mL) beverage (coffee or tea).   Sugar, if desired.  Midafternoon Snack   cup (125 mL) fruit ice.    cup (125 mL) strained fruit juice.  Dinner  1 cup (250 mL) broth or consomm.    cup (125 mL) cranberry juice.    cup (125 mL) flavored gelatin (plain or fortified).   1 cup (250 mL) beverage (coffee or tea).   Sugar, if desired.  Evening Snack  4 oz (120 mL) strained apple juice (vitamin C-fortified).    cup (125 mL) flavored gelatin (plain or fortified).  Document Released: 02/08/2005 Document Revised: 01/28/2011 Document Reviewed: 05/08/2010 [facility] Patient Information 2012 [place], [place].Viral Gastroenteritis Viral gastroenteritis is also known as stomach flu. This condition affects the stomach and intestinal tract. It can cause sudden diarrhea and vomiting.  The illness typically lasts 3 to 8 days. Most people develop an immune response that eventually gets rid of the virus. While this natural response develops, the virus can make you quite ill. CAUSES  Many different viruses can cause gastroenteritis, such as rotavirus or noroviruses. You can catch one of these viruses by consuming contaminated food or water. You may also catch a virus by sharing utensils or other personal items with an infected person or by touching a contaminated surface. SYMPTOMS  The most common symptoms are diarrhea and vomiting. These problems can cause a severe loss of body fluids (dehydration) and a body salt (electrolyte) imbalance. Other symptoms may include:  Fever.   Headache.   Fatigue.   Abdominal pain.  DIAGNOSIS  Your caregiver can usually diagnose viral gastroenteritis based on your symptoms and a physical exam. A stool sample may also be taken to test for the presence of viruses or other infections. TREATMENT  This illness typically goes away on its own.  Treatments are aimed at rehydration. The most serious cases of viral gastroenteritis involve vomiting so severely that you are not able to keep fluids down. In these cases, fluids must be given through an intravenous line (IV). HOME CARE INSTRUCTIONS   Drink enough fluids to keep your urine clear or pale yellow. Drink small amounts of fluids frequently and increase the amounts as tolerated.   Ask your caregiver for specific rehydration instructions.   Avoid:   Foods high in sugar.   Alcohol.   Carbonated drinks.   Tobacco.   Juice.   Caffeine drinks.   Extremely hot or cold fluids.   Fatty, greasy foods.   Too much intake of anything at one time.   Dairy products until 24 to 48 hours after diarrhea stops.   You may consume probiotics. Probiotics are active cultures of beneficial bacteria. They may lessen the amount and number of diarrheal stools in adults. Probiotics can be found in yogurt with active cultures and in supplements.   Wash your hands well to avoid spreading the virus.   Only take over-the-counter or prescription medicines for pain, discomfort, or fever as directed by your caregiver. Do not give aspirin to children. Antidiarrheal medicines are not recommended.   Ask your caregiver if you should continue to take your regular prescribed and over-the-counter medicines.   Keep all follow-up appointments as directed by your caregiver.  SEEK IMMEDIATE MEDICAL CARE IF:   You are unable to keep fluids down.   You do not urinate at least once every 6 to 8 hours.   You develop shortness of breath.   You notice blood in your stool or vomit. This may look like coffee grounds.   You have abdominal pain that increases or is concentrated in one small area (localized).   You have persistent vomiting or diarrhea.   You have a fever.   The patient is a child younger than 3 months, and he or she has a fever.   The patient is a child older than 3 months, and he or she  has a fever and persistent symptoms.   The patient is a child older than 3 months, and he or she has a fever and symptoms suddenly get worse.   The patient is a baby, and he or she has no tears when crying.  MAKE SURE YOU:   Understand these instructions.   Will watch your condition.   Will get help right away if you are not doing well or  get worse.  Document Released: 02/08/2005 Document Revised: 01/28/2011 Document Reviewed: 11/25/2010 [facility] Patient Information 2012 [place], [place].

## 2011-06-10 DIAGNOSIS — K219 Gastro-esophageal reflux disease without esophagitis: Secondary | ICD-10-CM | POA: Diagnosis not present

## 2011-06-10 DIAGNOSIS — K5900 Constipation, unspecified: Secondary | ICD-10-CM | POA: Diagnosis not present

## 2011-06-22 ENCOUNTER — Encounter: Payer: Self-pay | Admitting: Internal Medicine

## 2011-06-22 ENCOUNTER — Telehealth: Payer: Self-pay | Admitting: Internal Medicine

## 2011-06-22 ENCOUNTER — Ambulatory Visit (INDEPENDENT_AMBULATORY_CARE_PROVIDER_SITE_OTHER): Payer: Medicare Other | Admitting: Internal Medicine

## 2011-06-22 VITALS — BP 134/60 | HR 79 | Temp 97.8°F | Ht 62.0 in | Wt 184.0 lb

## 2011-06-22 DIAGNOSIS — E785 Hyperlipidemia, unspecified: Secondary | ICD-10-CM | POA: Diagnosis not present

## 2011-06-22 DIAGNOSIS — Z79899 Other long term (current) drug therapy: Secondary | ICD-10-CM

## 2011-06-22 DIAGNOSIS — I10 Essential (primary) hypertension: Secondary | ICD-10-CM | POA: Diagnosis not present

## 2011-06-22 DIAGNOSIS — N390 Urinary tract infection, site not specified: Secondary | ICD-10-CM | POA: Diagnosis not present

## 2011-06-22 DIAGNOSIS — R011 Cardiac murmur, unspecified: Secondary | ICD-10-CM

## 2011-06-22 DIAGNOSIS — E039 Hypothyroidism, unspecified: Secondary | ICD-10-CM

## 2011-06-22 LAB — TSH: TSH: 1.25 u[IU]/mL (ref 0.350–4.500)

## 2011-06-22 NOTE — Patient Instructions (Signed)
Please schedule fasting labs prior to your next visit Lipid/lft-272.4 and chem7-v58.69 

## 2011-06-22 NOTE — Telephone Encounter (Signed)
Lab orders entered for July 2013. 

## 2011-06-27 DIAGNOSIS — N390 Urinary tract infection, site not specified: Secondary | ICD-10-CM | POA: Insufficient documentation

## 2011-06-27 DIAGNOSIS — I10 Essential (primary) hypertension: Secondary | ICD-10-CM | POA: Insufficient documentation

## 2011-06-27 DIAGNOSIS — E785 Hyperlipidemia, unspecified: Secondary | ICD-10-CM | POA: Insufficient documentation

## 2011-06-27 DIAGNOSIS — R011 Cardiac murmur, unspecified: Secondary | ICD-10-CM | POA: Insufficient documentation

## 2011-06-27 DIAGNOSIS — E039 Hypothyroidism, unspecified: Secondary | ICD-10-CM | POA: Insufficient documentation

## 2011-06-27 NOTE — Assessment & Plan Note (Signed)
Asx. Consider prophylactic abx if develops further frequent utis

## 2011-06-27 NOTE — Assessment & Plan Note (Signed)
Stable. Obtain lipid/lft prior to next visit. 

## 2011-06-27 NOTE — Assessment & Plan Note (Signed)
Asx. Obtain tsh and free t4

## 2011-06-27 NOTE — Progress Notes (Signed)
  Subjective:    Patient ID: Alyssa Williamson, female    DOB: [birth year], 78 y.o.   MRN: [id number]  HPI  Pt presents to clinic for evaluation of multiple medical problems. Seen in ED with abdominal pain. Saw pmd after ED and dx'ed with h/ pylori gastritis. abd pain resolving. Tolerates statin tx without myalgias or abn lft. BP reviewed under average control. H/o hypothyroidism and previously obtained thyroid US but was released from f/u. Notes h/o murmur chronically for years without sx's. Has had 3 uti's since bladder tack one year ago. No currently on abx prophylaxis but has been discussed.  Past Medical History  Diagnosis Date  . Diverticulitis   . Arthritis     bilateral knee  . Hypothyroid   . Allergic rhinitis   . Heart murmur   . Hyperlipidemia    Past Surgical History  Procedure Date  . Replacement total knee 10/2010    right knee-[facility]  . Bladder tack 04/2010    Dr Marciano Sequin  . Abdominal hysterectomy     reports that she has never smoked. She has never used smokeless tobacco. She reports that she drinks alcohol. She reports that she does not use illicit drugs. family history includes Alcohol abuse in her father; Brain cancer in her daughter; Breast cancer in her daughter and sister; Diabetes in her brother; Heart failure in her mother; and Prostate cancer in her brother.  There is no history of Colon cancer and Hypertension. Allergies  Allergen Reactions  . Codeine Nausea And Vomiting     Review of Systems  Gastrointestinal: Positive for abdominal pain. Negative for blood in stool.  All other systems reviewed and are negative.       Objective:   Physical Exam  Nursing note and vitals reviewed. Constitutional: She appears well-developed and well-nourished. No distress.  HENT:  Head: Normocephalic and atraumatic.  Right Ear: External ear normal.  Left Ear: External ear normal.  Mouth/Throat: Oropharynx is clear and moist. No oropharyngeal exudate.    Eyes: Conjunctivae are normal. No scleral icterus.  Neck: Neck supple. Carotid bruit is not present. No thyromegaly present.  Cardiovascular: Normal rate and regular rhythm.   Murmur heard.  Systolic murmur is present with a grade of 3/6  Pulmonary/Chest: Effort normal and breath sounds normal. No respiratory distress. She has no wheezes. She has no rales.  Lymphadenopathy:    She has no cervical adenopathy.  Neurological: She is alert.  Skin: Skin is warm and dry. She is not diaphoretic.  Psychiatric: She has a normal mood and affect.          Assessment & Plan:

## 2011-06-27 NOTE — Assessment & Plan Note (Signed)
Normotensive and stable. Continue current regimen. Monitor bp as outpt and followup in clinic as scheduled.  

## 2011-06-29 ENCOUNTER — Telehealth: Payer: Self-pay | Admitting: Internal Medicine

## 2011-06-29 NOTE — Telephone Encounter (Signed)
Received medical records from Dr. Nolon Nations

## 2011-07-23 DIAGNOSIS — R300 Dysuria: Secondary | ICD-10-CM | POA: Diagnosis not present

## 2011-07-23 DIAGNOSIS — N390 Urinary tract infection, site not specified: Secondary | ICD-10-CM | POA: Diagnosis not present

## 2011-07-23 DIAGNOSIS — R3911 Hesitancy of micturition: Secondary | ICD-10-CM | POA: Diagnosis not present

## 2011-08-04 ENCOUNTER — Ambulatory Visit (HOSPITAL_COMMUNITY): Admission: RE | Admit: 2011-08-04 | Payer: Medicare Other | Source: Ambulatory Visit | Admitting: Orthopedic Surgery

## 2011-08-04 ENCOUNTER — Encounter (HOSPITAL_COMMUNITY): Admission: RE | Payer: Self-pay | Source: Ambulatory Visit

## 2011-08-04 SURGERY — ARTHROPLASTY, KNEE, TOTAL
Anesthesia: General | Laterality: Left

## 2011-08-10 ENCOUNTER — Telehealth: Payer: Self-pay | Admitting: *Deleted

## 2011-08-10 MED ORDER — ALPRAZOLAM 0.5 MG PO TABS: 0.5000 mg | ORAL_TABLET | Freq: Every evening | ORAL | Status: DC | PRN

## 2011-08-10 NOTE — Telephone Encounter (Signed)
Call placed to [facility] at [phone], verbal refill provided to pharmacist.  Call placed to patient at [phone], she was informed of Rx approval to pharmacy.

## 2011-08-10 NOTE — Telephone Encounter (Signed)
#  [id number] 

## 2011-08-10 NOTE — Telephone Encounter (Signed)
Patient called and left voice message requesting a refill on Xanax 0.5 mg.

## 2011-08-24 ENCOUNTER — Encounter: Payer: Self-pay | Admitting: Internal Medicine

## 2011-08-24 ENCOUNTER — Ambulatory Visit (INDEPENDENT_AMBULATORY_CARE_PROVIDER_SITE_OTHER): Payer: Medicare Other | Admitting: Internal Medicine

## 2011-08-24 VITALS — BP 136/78 | HR 58 | Temp 98.4°F | Resp 16 | Wt 180.8 lb

## 2011-08-24 DIAGNOSIS — N390 Urinary tract infection, site not specified: Secondary | ICD-10-CM | POA: Diagnosis not present

## 2011-08-24 MED ORDER — CIPROFLOXACIN HCL 500 MG PO TABS: 500.0000 mg | ORAL_TABLET | Freq: Two times a day (BID) | ORAL | Status: AC

## 2011-08-24 NOTE — Assessment & Plan Note (Signed)
Proceed with urology consult

## 2011-08-24 NOTE — Assessment & Plan Note (Signed)
Obtain ua and urine cx. Begin abx tx. Followup if no improvement or worsening.

## 2011-08-24 NOTE — Progress Notes (Signed)
  Subjective:    Patient ID: Alyssa Williamson, female    DOB: April 11, 1931, 78 y.o.   MRN: [id number]  HPI Pt presents to clinic for evaluation of recurrent UTI. Notes urinary frequency, suprapubic discomfort without f/c, hematuria, back pain, n/v. H/o recurrent uti which began after bladder surgery ~ 1 year ago. Also with h/o urethral stricture. No alleviating or exacerbating factors.  Past Medical History  Diagnosis Date  . Diverticulitis   . Arthritis     bilateral knee  . Hypothyroid   . Allergic rhinitis   . Heart murmur   . Hyperlipidemia    Past Surgical History  Procedure Date  . Replacement total knee 10/2010    right knee-[facility]  . Bladder tack 04/2010    Dr Marciano Sequin  . Abdominal hysterectomy     reports that she has never smoked. She has never used smokeless tobacco. She reports that she drinks alcohol. She reports that she does not use illicit drugs. family history includes Alcohol abuse in her father; Brain cancer in her daughter; Breast cancer in her daughter and sister; Diabetes in her brother; Heart failure in her mother; and Prostate cancer in her brother.  There is no history of Colon cancer and Hypertension. Allergies  Allergen Reactions  . Codeine Nausea And Vomiting     Review of Systems see hpi     Objective:   Physical Exam  Nursing note and vitals reviewed. Constitutional: She appears well-developed and well-nourished. No distress.  HENT:  Head: Normocephalic and atraumatic.  Right Ear: External ear normal.  Left Ear: External ear normal.  Abdominal: Soft. She exhibits no distension. There is no tenderness.  Musculoskeletal:       No cva tenderness   Neurological: She is alert.  Skin: Skin is warm and dry. She is not diaphoretic.  Psychiatric: She has a normal mood and affect.          Assessment & Plan:

## 2011-08-25 LAB — URINALYSIS, ROUTINE W REFLEX MICROSCOPIC
Bilirubin Urine: NEGATIVE
Nitrite: POSITIVE — AB
Protein, ur: NEGATIVE mg/dL
Urobilinogen, UA: 0.2 mg/dL (ref 0.0–1.0)

## 2011-08-25 LAB — URINALYSIS, MICROSCOPIC ONLY: Crystals: NONE SEEN

## 2011-08-27 LAB — URINE CULTURE

## 2011-09-16 DIAGNOSIS — N390 Urinary tract infection, site not specified: Secondary | ICD-10-CM | POA: Diagnosis not present

## 2011-09-22 ENCOUNTER — Ambulatory Visit: Payer: Medicare Other | Admitting: Internal Medicine

## 2011-09-29 ENCOUNTER — Ambulatory Visit: Payer: Medicare Other | Admitting: Internal Medicine

## 2011-09-30 ENCOUNTER — Other Ambulatory Visit: Payer: Self-pay | Admitting: *Deleted

## 2011-10-04 DIAGNOSIS — N390 Urinary tract infection, site not specified: Secondary | ICD-10-CM | POA: Diagnosis not present

## 2011-10-21 ENCOUNTER — Encounter: Payer: Self-pay | Admitting: Internal Medicine

## 2011-10-21 ENCOUNTER — Ambulatory Visit (INDEPENDENT_AMBULATORY_CARE_PROVIDER_SITE_OTHER): Payer: Medicare Other | Admitting: Internal Medicine

## 2011-10-21 VITALS — BP 118/66 | HR 56 | Temp 97.8°F | Resp 14 | Wt 183.2 lb

## 2011-10-21 DIAGNOSIS — E785 Hyperlipidemia, unspecified: Secondary | ICD-10-CM

## 2011-10-21 DIAGNOSIS — R3915 Urgency of urination: Secondary | ICD-10-CM | POA: Diagnosis not present

## 2011-10-21 DIAGNOSIS — N390 Urinary tract infection, site not specified: Secondary | ICD-10-CM

## 2011-10-21 DIAGNOSIS — R82998 Other abnormal findings in urine: Secondary | ICD-10-CM

## 2011-10-21 DIAGNOSIS — R8290 Unspecified abnormal findings in urine: Secondary | ICD-10-CM

## 2011-10-21 DIAGNOSIS — R109 Unspecified abdominal pain: Secondary | ICD-10-CM | POA: Diagnosis not present

## 2011-10-21 DIAGNOSIS — E039 Hypothyroidism, unspecified: Secondary | ICD-10-CM

## 2011-10-21 LAB — CBC WITH DIFFERENTIAL/PLATELET
Eosinophils Relative: 2 % (ref 0–5)
HCT: 38.6 % (ref 36.0–46.0)
Lymphocytes Relative: 23 % (ref 12–46)
Lymphs Abs: 1.5 10*3/uL (ref 0.7–4.0)
MCV: 89.1 fL (ref 78.0–100.0)
Monocytes Absolute: 0.7 10*3/uL (ref 0.1–1.0)
Monocytes Relative: 10 % (ref 3–12)
RBC: 4.33 MIL/uL (ref 3.87–5.11)
WBC: 6.8 10*3/uL (ref 4.0–10.5)

## 2011-10-21 LAB — POCT URINALYSIS DIPSTICK
Blood, UA: NEGATIVE
Glucose, UA: NEGATIVE
Nitrite, UA: NEGATIVE
Urobilinogen, UA: 0.2

## 2011-10-21 LAB — T4, FREE: Free T4: 1.29 ng/dL (ref 0.80–1.80)

## 2011-10-21 MED ORDER — CIPROFLOXACIN HCL 500 MG PO TABS: 500.0000 mg | ORAL_TABLET | Freq: Two times a day (BID) | ORAL | Status: AC

## 2011-10-22 ENCOUNTER — Ambulatory Visit (HOSPITAL_BASED_OUTPATIENT_CLINIC_OR_DEPARTMENT_OTHER)
Admission: RE | Admit: 2011-10-22 | Discharge: 2011-10-22 | Disposition: A | Discharge status: Home / Self Care | Payer: Medicare Other | Source: Ambulatory Visit | Attending: Internal Medicine | Admitting: Internal Medicine

## 2011-10-22 DIAGNOSIS — N269 Renal sclerosis, unspecified: Secondary | ICD-10-CM | POA: Diagnosis not present

## 2011-10-22 DIAGNOSIS — R109 Unspecified abdominal pain: Secondary | ICD-10-CM | POA: Insufficient documentation

## 2011-10-22 LAB — LIPID PANEL: Cholesterol: 137 mg/dL (ref 0–200)

## 2011-10-24 LAB — CULTURE, URINE COMPREHENSIVE

## 2011-10-31 NOTE — Assessment & Plan Note (Signed)
Obtain tsh/free t4 

## 2011-10-31 NOTE — Progress Notes (Signed)
  Subjective:    Patient ID: Alyssa Williamson, female    DOB: 04-10-1931, 78 y.o.   MRN: [id number]  HPI Pt presents to clinic for followup of multiple medical problems. Now seeing urology for recurrent uti and is s/p cysto. C/o recent urinary urgency without hematuria, fever or back pain. Notes intermittent ruq pain without radiation, nausea or vomitting. S/p abd ct 4/13.  Past Medical History  Diagnosis Date  . Diverticulitis   . Arthritis     bilateral knee  . Hypothyroid   . Allergic rhinitis   . Heart murmur   . Hyperlipidemia    Past Surgical History  Procedure Date  . Replacement total knee 10/2010    right knee-[facility]  . Bladder tack 04/2010    Dr Marciano Sequin  . Abdominal hysterectomy     reports that she has never smoked. She has never used smokeless tobacco. She reports that she drinks alcohol. She reports that she does not use illicit drugs. family history includes Alcohol abuse in her father; Brain cancer in her daughter; Breast cancer in her daughter and sister; Diabetes in her brother; Heart failure in her mother; and Prostate cancer in her brother.  There is no history of Colon cancer and Hypertension. Allergies  Allergen Reactions  . Codeine Nausea And Vomiting      Review of Systems see hpi     Objective:   Physical Exam  Nursing note and vitals reviewed. Constitutional: She appears well-developed and well-nourished. No distress.  HENT:  Head: Normocephalic and atraumatic.  Right Ear: External ear normal.  Left Ear: External ear normal.  Eyes: Conjunctivae are normal. No scleral icterus.  Neck: Neck supple.  Cardiovascular: Normal rate, regular rhythm and normal heart sounds.  Exam reveals no gallop and no friction rub.   No murmur heard. Pulmonary/Chest: Effort normal and breath sounds normal. No respiratory distress. She has no wheezes. She has no rales.  Abdominal: Soft. Bowel sounds are normal. She exhibits no distension and no mass. There  is no rebound and no guarding.       Mild RUQ tenderness  Neurological: She is alert.  Skin: She is not diaphoretic.  Psychiatric: She has a normal mood and affect.          Assessment & Plan:

## 2011-10-31 NOTE — Assessment & Plan Note (Signed)
Attempt cipro. Obtain ua and cx.

## 2011-10-31 NOTE — Assessment & Plan Note (Signed)
Obtain lipid/lft. 

## 2011-11-09 DIAGNOSIS — N390 Urinary tract infection, site not specified: Secondary | ICD-10-CM | POA: Diagnosis not present

## 2011-11-12 DIAGNOSIS — Z23 Encounter for immunization: Secondary | ICD-10-CM | POA: Diagnosis not present

## 2011-11-26 ENCOUNTER — Telehealth: Payer: Self-pay | Admitting: Internal Medicine

## 2011-11-26 MED ORDER — VERAPAMIL HCL ER 180 MG PO TBCR: 180.0000 mg | EXTENDED_RELEASE_TABLET | Freq: Every day | ORAL | Status: DC

## 2011-11-26 MED ORDER — ALPRAZOLAM 0.5 MG PO TABS: 0.5000 mg | ORAL_TABLET | Freq: Every evening | ORAL | Status: DC | PRN

## 2011-11-26 NOTE — Telephone Encounter (Signed)
Refill- alprazolam 0.5mg  tab. Last fill 9.8.13  Refill- verapamil hcl er 180mg  oral tablet extended release. Take one tablet by mouth one time daily. Qty 30 last fill 9.3.13

## 2011-11-26 NOTE — Telephone Encounter (Signed)
Done/SLS 

## 2011-12-01 ENCOUNTER — Telehealth: Payer: Self-pay | Admitting: Internal Medicine

## 2011-12-01 NOTE — Telephone Encounter (Signed)
When she picked up her thyroid medication she was told by the pharmacy that she needs to have lab work done.  Do not see a future order for labs.  She had her labs done on the day of the last visit.  Please advise patient when and if she needs to come

## 2011-12-01 NOTE — Telephone Encounter (Signed)
Pt advised via personal VM that per las lab result note, labs are not needed at this time.

## 2011-12-03 ENCOUNTER — Telehealth: Payer: Self-pay | Admitting: Internal Medicine

## 2011-12-03 MED ORDER — LEVOTHYROXINE SODIUM 75 MCG PO TABS: 75.0000 ug | ORAL_TABLET | Freq: Every day | ORAL | Status: DC

## 2011-12-03 NOTE — Telephone Encounter (Signed)
Refill on med to [facility]

## 2011-12-03 NOTE — Telephone Encounter (Signed)
Refill- levothyroxine sodium oral tablet.

## 2011-12-27 ENCOUNTER — Telehealth: Payer: Self-pay | Admitting: Internal Medicine

## 2011-12-27 MED ORDER — CITALOPRAM HYDROBROMIDE 20 MG PO TABS: 20.0000 mg | ORAL_TABLET | Freq: Every day | ORAL | Status: DC

## 2011-12-27 NOTE — Telephone Encounter (Signed)
Refill- celexa 20mg  tablet. Take one tablet by mouth one time daily. Qty 30 last fill 10.4.13

## 2011-12-27 NOTE — Telephone Encounter (Signed)
Rx to pharmacy/SLS 

## 2011-12-27 NOTE — Telephone Encounter (Signed)
[id number]

## 2011-12-31 ENCOUNTER — Telehealth: Payer: Self-pay | Admitting: Internal Medicine

## 2011-12-31 MED ORDER — ATORVASTATIN CALCIUM 40 MG PO TABS: 40.0000 mg | ORAL_TABLET | Freq: Every day | ORAL | Status: DC

## 2011-12-31 NOTE — Telephone Encounter (Signed)
Rx to pharmacy/SLS 

## 2011-12-31 NOTE — Addendum Note (Signed)
Addended by: Regis Bill on: 12/31/2011 05:27 PM   Modules accepted: Orders

## 2011-12-31 NOTE — Telephone Encounter (Signed)
Refill atorvastatin 40 mg tab take 1 tablet by mouth 1 time daily qty 30 last fill 12-31-2011

## 2012-01-26 ENCOUNTER — Ambulatory Visit: Payer: Medicare Other | Admitting: Internal Medicine

## 2012-02-07 ENCOUNTER — Ambulatory Visit (INDEPENDENT_AMBULATORY_CARE_PROVIDER_SITE_OTHER): Payer: Medicare Other | Admitting: Family

## 2012-02-07 ENCOUNTER — Encounter: Payer: Self-pay | Admitting: Family

## 2012-02-07 VITALS — BP 140/78 | HR 64 | Temp 97.7°F | Resp 16 | Wt 188.0 lb

## 2012-02-07 DIAGNOSIS — J329 Chronic sinusitis, unspecified: Secondary | ICD-10-CM | POA: Diagnosis not present

## 2012-02-07 MED ORDER — AMOXICILLIN-POT CLAVULANATE 875-125 MG PO TABS: 1.0000 | ORAL_TABLET | Freq: Two times a day (BID) | ORAL | Status: DC

## 2012-02-07 NOTE — Assessment & Plan Note (Signed)
Will rx with Augmentin.  Pt is instructed to call if symptoms worsen or if no improvement in 2-3 days. She verbalizes understanding.

## 2012-02-07 NOTE — Progress Notes (Signed)
Subjective:    Patient ID: Alyssa Williamson, female    DOB: 09/05/1931, 78 y.o.   MRN: [id number]  HPI  Ms. Bellissimo is a 78 yr old female who presents today with chief complaint of wheezing.  She reports "sinus infection."  She reports that sinus congestion started 5 days ago.  Symptoms started with sore throat.  She used nasal saline, gargled with listerine. Symptoms worsened.  She reports that she has nasal drainage- clear in color.  + facial/sinus pressure. She denies associatedfever.  She reports that her daughter has similar symptoms. She goes to [place] on Thursdays and walked into a room of a sick person.    Review of Systems See HPI  Past Medical History  Diagnosis Date  . Diverticulitis   . Arthritis     bilateral knee  . Hypothyroid   . Allergic rhinitis   . Heart murmur   . Hyperlipidemia     History   Social History  . Marital Status: Widowed    Spouse Name: N/A    Number of Children: N/A  . Years of Education: N/A   Occupational History  . Not on file.   Social History Main Topics  . Smoking status: Never Smoker   . Smokeless tobacco: Never Used  . Alcohol Use: Yes     Comment: rare  . Drug Use: No  . Sexually Active:    Other Topics Concern  . Not on file   Social History Narrative  . No narrative on file    Past Surgical History  Procedure Date  . Replacement total knee 10/2010    right knee-[facility] Mood  . Bladder tack 04/2010    Dr Marciano Sequin  . Abdominal hysterectomy     Family History  Problem Relation Age of Onset  . Alcohol abuse Father   . Breast cancer Sister   . Breast cancer Daughter     1 73f 3  . Brain cancer Daughter     1 of 3  . Heart failure Mother   . Prostate cancer Brother     2 of 5  . Colon cancer Neg Hx   . Hypertension Neg Hx   . Diabetes Brother     1 of 5    Allergies  Allergen Reactions  . Codeine Nausea And Vomiting    Current Outpatient Prescriptions on File Prior to Visit  Medication Sig  Dispense Refill  . ALPRAZolam (XANAX) 0.5 MG tablet Take 1 tablet (0.5 mg total) by mouth at bedtime as needed.  30 tablet  3  . amLODipine (NORVASC) 10 MG tablet Take 10 mg by mouth daily.      Marland Kitchen atorvastatin (LIPITOR) 40 MG tablet Take 1 tablet (40 mg total) by mouth daily.  30 tablet  3  . chlorpheniramine (CHLOR-TRIMETON) 4 MG tablet Take 4 mg by mouth 2 (two) times daily as needed.      . citalopram (CELEXA) 20 MG tablet Take 1 tablet (20 mg total) by mouth daily.  30 tablet  6  . furosemide (LASIX) 40 MG tablet Take 40 mg by mouth daily.      Marland Kitchen levothyroxine (SYNTHROID, LEVOTHROID) 75 MCG tablet Take 1 tablet (75 mcg total) by mouth daily.  30 tablet  10  . loratadine (CLARITIN) 10 MG tablet Take 10 mg by mouth daily.      . Multiple Vitamins-Minerals (CENTRUM SILVER PO) Take 1 tablet by mouth daily.      . verapamil (  CALAN-SR) 180 MG CR tablet Take 1 tablet (180 mg total) by mouth at bedtime.  30 tablet  3    BP 140/78  Pulse 64  Temp 97.7 F (36.5 C) (Oral)  Resp 16  Wt 188 lb 0.6 oz (85.294 kg)  SpO2 98%       Objective:   Physical Exam  Constitutional: She appears well-developed and well-nourished. No distress.  HENT:  Head: Normocephalic and atraumatic.  Mouth/Throat: Posterior oropharyngeal erythema present. No oropharyngeal exudate or posterior oropharyngeal edema.       + cerumen impaction right ear.  Cerumen plug removed with curette revealing a normal R TM. L TM is normal   Cardiovascular: Normal rate and regular rhythm.   No murmur heard. Pulmonary/Chest: Effort normal and breath sounds normal. No respiratory distress. She has no wheezes. She has no rales. She exhibits no tenderness.  Lymphadenopathy:    She has no cervical adenopathy.  Neurological: She is alert.  Skin: Skin is warm and dry.  Psychiatric: She has a normal mood and affect. Her behavior is normal. Judgment and thought content normal.          Assessment & Plan:

## 2012-02-07 NOTE — Patient Instructions (Addendum)

## 2012-02-29 ENCOUNTER — Ambulatory Visit: Payer: Medicare Other | Admitting: Internal Medicine

## 2012-03-08 ENCOUNTER — Ambulatory Visit (INDEPENDENT_AMBULATORY_CARE_PROVIDER_SITE_OTHER): Payer: Medicare Other | Admitting: Internal Medicine

## 2012-03-08 ENCOUNTER — Encounter: Payer: Self-pay | Admitting: Internal Medicine

## 2012-03-08 VITALS — BP 138/78 | HR 57 | Temp 97.6°F | Resp 16 | Wt 182.0 lb

## 2012-03-08 DIAGNOSIS — Z79899 Other long term (current) drug therapy: Secondary | ICD-10-CM

## 2012-03-08 DIAGNOSIS — E785 Hyperlipidemia, unspecified: Secondary | ICD-10-CM | POA: Diagnosis not present

## 2012-03-08 DIAGNOSIS — I10 Essential (primary) hypertension: Secondary | ICD-10-CM

## 2012-03-08 DIAGNOSIS — N390 Urinary tract infection, site not specified: Secondary | ICD-10-CM

## 2012-03-08 DIAGNOSIS — E039 Hypothyroidism, unspecified: Secondary | ICD-10-CM | POA: Diagnosis not present

## 2012-03-08 LAB — BASIC METABOLIC PANEL
BUN: 14 mg/dL (ref 6–23)
CO2: 30 mEq/L (ref 19–32)
Calcium: 9.5 mg/dL (ref 8.4–10.5)
Glucose, Bld: 89 mg/dL (ref 70–99)

## 2012-03-08 LAB — HEPATIC FUNCTION PANEL
ALT: 19 U/L (ref 0–35)
AST: 22 U/L (ref 0–37)
Bilirubin, Direct: 0.2 mg/dL (ref 0.0–0.3)
Indirect Bilirubin: 0.7 mg/dL (ref 0.0–0.9)
Total Bilirubin: 0.9 mg/dL (ref 0.3–1.2)

## 2012-03-08 LAB — LIPID PANEL
Cholesterol: 174 mg/dL (ref 0–200)
VLDL: 20 mg/dL (ref 0–40)

## 2012-03-08 NOTE — Assessment & Plan Note (Signed)
Normotensive and stable. Continue current regimen. Monitor bp as outpt and followup in clinic as scheduled.  

## 2012-03-08 NOTE — Assessment & Plan Note (Signed)
Obtain tsh  

## 2012-03-08 NOTE — Progress Notes (Signed)
  Subjective:    Patient ID: Alyssa Williamson, female    DOB: 1931-07-10, 79 y.o.   MRN: [id number]  HPI Pt presents to clinic for followup of multiple medical problems. No recent UTI. S/p urology evaluation and taking daily abx prophylaxis. Under increased stress with the health of her daughter. Compliant with medication without adverse effect. Received flu vaccine for the season.  Past Medical History  Diagnosis Date  . Diverticulitis   . Arthritis     bilateral knee  . Hypothyroid   . Allergic rhinitis   . Heart murmur   . Hyperlipidemia    Past Surgical History  Procedure Date  . Replacement total knee 10/2010    right knee-[facility]  . Bladder tack 04/2010    Dr Marciano Sequin  . Abdominal hysterectomy     reports that she has never smoked. She has never used smokeless tobacco. She reports that she drinks alcohol. She reports that she does not use illicit drugs. family history includes Alcohol abuse in her father; Brain cancer in her daughter; Breast cancer in her daughter and sister; Diabetes in her brother; Heart failure in her mother; and Prostate cancer in her brother.  There is no history of Colon cancer and Hypertension. Allergies  Allergen Reactions  . Codeine Nausea And Vomiting      Review of Systems see hpi    Objective:   Physical Exam  Physical Exam  Nursing note and vitals reviewed. Constitutional: Appears well-developed and well-nourished. No distress.  HENT:  Head: Normocephalic and atraumatic.  Right Ear: External ear normal.  Left Ear: External ear normal.  Eyes: Conjunctivae are normal. No scleral icterus.  Neck: Neck supple. Carotid bruit is not present.  Cardiovascular: Normal rate, regular rhythm and normal heart sounds.  Exam reveals no gallop and no friction rub.   No murmur heard. Pulmonary/Chest: Effort normal and breath sounds normal. No respiratory distress. He has no wheezes. no rales.  Lymphadenopathy:    He has no cervical  adenopathy.  Neurological:Alert.  Skin: Skin is warm and dry. Not diaphoretic.  Psychiatric: Has a normal mood and affect.        Assessment & Plan:

## 2012-03-08 NOTE — Assessment & Plan Note (Signed)
Obtain lipid/lft. 

## 2012-03-08 NOTE — Assessment & Plan Note (Signed)
Asx. Continue abx prophylaxis

## 2012-03-21 DIAGNOSIS — M1710 Unilateral primary osteoarthritis, unspecified knee: Secondary | ICD-10-CM | POA: Diagnosis not present

## 2012-03-23 ENCOUNTER — Telehealth: Payer: Self-pay

## 2012-03-23 NOTE — Telephone Encounter (Signed)
I informed pt that we received paperwork from Alyssa Williamson for surgery clearance on her knee. I informed pt that MD states she would need to come in for an appt to be cleared. Pt states she will call back to schedule appt.

## 2012-03-27 ENCOUNTER — Other Ambulatory Visit: Payer: Self-pay | Admitting: Internal Medicine

## 2012-03-27 NOTE — Telephone Encounter (Signed)
Please advise refill? Last RX was wrote on 11-26-11 quantity 30 with 3 refills  If ok fax to [phone]

## 2012-04-23 ENCOUNTER — Other Ambulatory Visit: Payer: Self-pay | Admitting: Family Medicine

## 2012-04-24 NOTE — Telephone Encounter (Signed)
Please advise refill? Last RX was wrote on 03-27-12 quantity 30 with 0 refills/   If ok fax to [phone]

## 2012-05-01 ENCOUNTER — Telehealth: Payer: Self-pay | Admitting: Internal Medicine

## 2012-05-01 MED ORDER — ATORVASTATIN CALCIUM 40 MG PO TABS: 40.0000 mg | ORAL_TABLET | Freq: Every day | ORAL | Status: DC

## 2012-05-01 NOTE — Telephone Encounter (Signed)
Atorvastatin 40 mg tablet take 1 tablet by mouth daily qty 30 last 2-12

## 2012-05-01 NOTE — Telephone Encounter (Signed)
RX sent

## 2012-05-04 ENCOUNTER — Telehealth: Payer: Self-pay

## 2012-05-04 NOTE — Telephone Encounter (Signed)
Left message on patients answering machine to call the office to schedule an appt so MD can sign off on patients surgery clearance paperwork.

## 2012-05-05 NOTE — Telephone Encounter (Signed)
05/23/2012 Sch Time: 2:00 PM Length: 30 Visit Type: OFFICE VISIT 30 [368] Copay: $0.00 Dray Dente: Bradd Canary, MD Department: [facility] Referring Adna Nofziger: Edwyna Perfect CSN: [id number]  Notes: Clearance for knee surgery/hea Made On: 05/05/2012 10:25 AM

## 2012-05-10 DIAGNOSIS — N390 Urinary tract infection, site not specified: Secondary | ICD-10-CM | POA: Diagnosis not present

## 2012-05-23 ENCOUNTER — Ambulatory Visit (INDEPENDENT_AMBULATORY_CARE_PROVIDER_SITE_OTHER): Payer: Medicare Other | Admitting: Family Medicine

## 2012-05-23 ENCOUNTER — Encounter: Payer: Self-pay | Admitting: Family Medicine

## 2012-05-23 VITALS — BP 112/64 | HR 60 | Temp 97.3°F | Ht 62.0 in | Wt 185.0 lb

## 2012-05-23 DIAGNOSIS — Z01810 Encounter for preprocedural cardiovascular examination: Secondary | ICD-10-CM

## 2012-05-23 DIAGNOSIS — R350 Frequency of micturition: Secondary | ICD-10-CM

## 2012-05-23 DIAGNOSIS — E039 Hypothyroidism, unspecified: Secondary | ICD-10-CM

## 2012-05-23 DIAGNOSIS — E785 Hyperlipidemia, unspecified: Secondary | ICD-10-CM

## 2012-05-23 DIAGNOSIS — I10 Essential (primary) hypertension: Secondary | ICD-10-CM | POA: Diagnosis not present

## 2012-05-23 DIAGNOSIS — N390 Urinary tract infection, site not specified: Secondary | ICD-10-CM

## 2012-05-23 DIAGNOSIS — M1710 Unilateral primary osteoarthritis, unspecified knee: Secondary | ICD-10-CM

## 2012-05-24 ENCOUNTER — Encounter: Payer: Self-pay | Admitting: Family Medicine

## 2012-05-24 DIAGNOSIS — M1710 Unilateral primary osteoarthritis, unspecified knee: Secondary | ICD-10-CM | POA: Insufficient documentation

## 2012-05-24 LAB — URINALYSIS
Bilirubin Urine: NEGATIVE
Protein, ur: NEGATIVE mg/dL
Urobilinogen, UA: 0.2 mg/dL (ref 0.0–1.0)

## 2012-05-24 NOTE — Assessment & Plan Note (Signed)
Tolerating Atorvastatin, no changes.  

## 2012-05-24 NOTE — Assessment & Plan Note (Addendum)
Here today for pre op clearance, L TKR procedure scheduled with Dr Eulah Pont on 06/28/12. She feels well and is cleared tpday pending cardiology evaluation secondary to her age and some minor EKG changes

## 2012-05-24 NOTE — Assessment & Plan Note (Signed)
tsh wnl, continue current dose of levothyroxine

## 2012-05-24 NOTE — Assessment & Plan Note (Signed)
Well controlled at today's visit. 

## 2012-05-24 NOTE — Progress Notes (Signed)
Patient ID: Alyssa Williamson, female   DOB: 19-Dec-1931, 79 y.o.   MRN: [id number] Alyssa Williamson [id number] 15-Apr-1931 05/24/2012      Progress Note-Follow Up  Subjective  Chief Complaint  Chief Complaint  Patient presents with  . clearance for knee surgery    left knee on 06-28-12    HPI  Patient is a 79 year old Caucasian female who is in today for clearance. She is scheduled for left knee total replacement in May of this year. Is significant debility and pain is a result of degeneration joint. Otherwise she reports she feels well except for recurrent UTIs. She denies any urinary symptoms at this time. No urinary frequency, dysuria or hematuria. She's recently been treated with ciprofloxacin for UTI and is maintained on Augmentin regularly. No chest pain, palpitations, shortness of breath, fatigue, headaches, congestion or GI complaints are noted.  Past Medical History  Diagnosis Date  . Diverticulitis   . Arthritis     bilateral knee  . Hypothyroid   . Allergic rhinitis   . Heart murmur   . Hyperlipidemia   . Arthritis of knee 05/24/2012    left    Past Surgical History  Procedure Laterality Date  . Replacement total knee  10/2010    right knee-Alyssa Williamson  . Bladder tack  04/2010    Dr Alyssa Williamson  . Abdominal hysterectomy      Family History  Problem Relation Age of Onset  . Alcohol abuse Father   . Breast cancer Sister   . Breast cancer Daughter     1 21f 3  . Brain cancer Daughter     1 of 3  . Heart failure Mother   . Prostate cancer Brother     2 of 5  . Colon cancer Neg Hx   . Hypertension Neg Hx   . Diabetes Brother     1 of 5    History   Social History  . Marital Status: Widowed    Spouse Name: N/A    Number of Children: N/A  . Years of Education: N/A   Occupational History  . Not on file.   Social History Main Topics  . Smoking status: Never Smoker   . Smokeless tobacco: Never Used  . Alcohol Use: Yes     Comment: rare  . Drug Use: No   . Sexually Active:    Other Topics Concern  . Not on file   Social History Narrative  . No narrative on file    Current Outpatient Prescriptions on File Prior to Visit  Medication Sig Dispense Refill  . ALPRAZolam (XANAX) 0.5 MG tablet TAKE 1 TABLET BY MOUTH AT BEDTIME AS NEEDED  30 tablet  1  . atorvastatin (LIPITOR) 40 MG tablet Take 1 tablet (40 mg total) by mouth daily.  30 tablet  3  . chlorpheniramine (CHLOR-TRIMETON) 4 MG tablet Take 4 mg by mouth 2 (two) times daily as needed.      . citalopram (CELEXA) 20 MG tablet Take 1 tablet (20 mg total) by mouth daily.  30 tablet  6  . furosemide (LASIX) 40 MG tablet Take 40 mg by mouth daily.      Marland Kitchen levothyroxine (SYNTHROID, LEVOTHROID) 75 MCG tablet Take 1 tablet (75 mcg total) by mouth daily.  30 tablet  10  . loratadine (CLARITIN) 10 MG tablet Take 10 mg by mouth daily.      . Multiple Vitamins-Minerals (CENTRUM SILVER PO) Take 1 tablet by  mouth daily.      . nitrofurantoin (MACRODANTIN) 50 MG capsule Take 50 mg by mouth at bedtime.      . verapamil (CALAN-SR) 180 MG CR tablet Take 1 tablet (180 mg total) by mouth at bedtime.  30 tablet  3   No current facility-administered medications on file prior to visit.    Allergies  Allergen Reactions  . Codeine Nausea And Vomiting    Review of Systems  Review of Systems  Constitutional: Negative for fever and malaise/fatigue.  HENT: Negative for congestion.   Eyes: Negative for discharge.  Respiratory: Negative for shortness of breath.   Cardiovascular: Negative for chest pain, palpitations and leg swelling.  Gastrointestinal: Negative for nausea, abdominal pain and diarrhea.  Genitourinary: Negative for dysuria.  Musculoskeletal: Positive for joint pain. Negative for falls.  Skin: Negative for rash.  Neurological: Negative for loss of consciousness and headaches.  Endo/Heme/Allergies: Negative for polydipsia.  Psychiatric/Behavioral: Negative for depression and suicidal ideas.  The patient is not nervous/anxious and does not have insomnia.     Objective  BP 112/64  Pulse 60  Temp(Src) 97.3 F (36.3 C) (Oral)  Ht 5\' 2"  (1.575 m)  Wt 185 lb 0.6 oz (83.934 kg)  BMI 33.84 kg/m2  SpO2 94%  Physical Exam  Physical Exam  Constitutional: She is oriented to person, place, and time and well-developed, well-nourished, and in no distress. No distress.  HENT:  Head: Normocephalic and atraumatic.  Eyes: Conjunctivae are normal.  Neck: Neck supple. No thyromegaly present.  Cardiovascular: Normal rate, regular rhythm and normal heart sounds.   Pulmonary/Chest: Effort normal and breath sounds normal. She has no wheezes.  Abdominal: She exhibits no distension and no mass.  Musculoskeletal: She exhibits no edema.  Lymphadenopathy:    She has no cervical adenopathy.  Neurological: She is alert and oriented to person, place, and time.  Skin: Skin is warm and dry. No rash noted. She is not diaphoretic.  Psychiatric: Memory, affect and judgment normal.    Lab Results  Component Value Date   TSH 2.293 03/08/2012   Lab Results  Component Value Date   WBC 6.8 10/21/2011   HGB 13.5 10/21/2011   HCT 38.6 10/21/2011   MCV 89.1 10/21/2011   PLT 243 10/21/2011   Lab Results  Component Value Date   CREATININE 0.86 03/08/2012   BUN 14 03/08/2012   NA 140 03/08/2012   K 4.9 03/08/2012   CL 104 03/08/2012   CO2 30 03/08/2012   Lab Results  Component Value Date   ALT 19 03/08/2012   AST 22 03/08/2012   ALKPHOS 83 03/08/2012   BILITOT 0.9 03/08/2012   Lab Results  Component Value Date   CHOL 174 03/08/2012   Lab Results  Component Value Date   HDL 59 03/08/2012   Lab Results  Component Value Date   LDLCALC 95 03/08/2012   Lab Results  Component Value Date   TRIG 98 03/08/2012   Lab Results  Component Value Date   CHOLHDL 2.9 03/08/2012     Assessment & Plan  HTN (hypertension) Well controlled at today's visit.   Hypothyroidism tsh wnl, continue current dose  of levothyroxine  Hyperlipidemia Tolerating Atorvastatin, no changes  Recurrent UTI Maintained on Macrobid, UA normal today  Arthritis of knee Here today for pre op clearance, L TKR procedure scheduled with Dr Alyssa Williamson on 06/28/12. She feels well and is cleared tpday pending cardiology evaluation secondary to her age and some minor EKG changes

## 2012-05-24 NOTE — Assessment & Plan Note (Signed)
Maintained on Macrobid, UA normal today

## 2012-05-25 ENCOUNTER — Telehealth: Payer: Self-pay

## 2012-05-25 LAB — URINE CULTURE: Colony Count: NO GROWTH

## 2012-05-25 NOTE — Telephone Encounter (Signed)
Message copied by Court Joy on Thu May 25, 2012  9:33 AM ------      Message from: Danise Edge A      Created: Wed May 24, 2012  9:51 PM       Please let her know I reviewed her EKG when writing her note. She had a slightly slow and some subtle flattening of her T waves. I think a cardiology referral is a good idea, I will place it if she agrees. ------

## 2012-05-25 NOTE — Telephone Encounter (Signed)
Left a message for pt to return my call. 

## 2012-05-26 NOTE — Telephone Encounter (Signed)
Left a detailed message on patients home answering machine

## 2012-05-30 ENCOUNTER — Telehealth: Payer: Self-pay

## 2012-05-30 DIAGNOSIS — N390 Urinary tract infection, site not specified: Secondary | ICD-10-CM | POA: Diagnosis not present

## 2012-05-30 NOTE — Telephone Encounter (Signed)
Pt left a message on my machine stating she was returning [facility] call? Pt would like a return call tomorrow afternoon at [phone]. Can you please call pt tomorrow? I'm assuming that its a referral?

## 2012-06-02 ENCOUNTER — Telehealth: Payer: Self-pay

## 2012-06-02 NOTE — Telephone Encounter (Signed)
Pt left a message stating that she would like to know why she is being referred to cardiology. Left a detailed message again for pt.

## 2012-06-07 ENCOUNTER — Ambulatory Visit (INDEPENDENT_AMBULATORY_CARE_PROVIDER_SITE_OTHER): Payer: Medicare Other | Admitting: Family

## 2012-06-07 ENCOUNTER — Encounter: Payer: Self-pay | Admitting: Family

## 2012-06-07 VITALS — BP 130/70 | HR 61 | Temp 97.4°F | Resp 16 | Wt 182.1 lb

## 2012-06-07 DIAGNOSIS — L538 Other specified erythematous conditions: Secondary | ICD-10-CM | POA: Diagnosis not present

## 2012-06-07 DIAGNOSIS — L304 Erythema intertrigo: Secondary | ICD-10-CM

## 2012-06-07 MED ORDER — NYSTATIN 100000 UNIT/GM EX OINT: TOPICAL_OINTMENT | Freq: Two times a day (BID) | CUTANEOUS | Status: DC

## 2012-06-07 NOTE — Progress Notes (Signed)
  Subjective:    Patient ID: Alyssa Williamson, female    DOB: Apr 16, 1931, 79 y.o.   MRN: [id number]  HPI  [name] is a 79 yr old female who presents today with chief complaint of rash.  Reports that rash started >6 weeks ago. She has tried multiple otc remedies including lotion and baby powder without improvement.   Review of Systems See HPI    Objective:   Physical Exam  Constitutional: She is oriented to person, place, and time. She appears well-developed and well-nourished. No distress.  HENT:  Head: Normocephalic and atraumatic.  Cardiovascular: Normal rate and regular rhythm.   No murmur heard. Pulmonary/Chest: Effort normal and breath sounds normal. No respiratory distress. She has no wheezes. She has no rales. She exhibits no tenderness.  Musculoskeletal: She exhibits no edema.  Neurological: She is alert and oriented to person, place, and time.  Skin: Skin is warm and dry.  Erythematous rash noted beneath both breasts L>R  Psychiatric: She has a normal mood and affect. Her behavior is normal. Thought content normal.          Assessment & Plan:

## 2012-06-07 NOTE — Patient Instructions (Addendum)
Intertrigo Intertrigo is a skin irritation (inflammation) that happens in warm, moist areas of the body. It happens mostly between folds of skin or where skin rubs together. HOME CARE  Keep the affected area cool and dry.  Leave the skin folds open to air.  Put cotton or linen between the folds of skin.  Avoid tight clothing.  Wear open-toed shoes or sandals.  Use powder on the affected area as told by your doctor.  Only use medicated creams or pastes as told by your doctor. GET HELP RIGHT AWAY IF:  The rash does not get better after 1 week of treatment.  The rash gets worse.  You have a fever or chills. MAKE SURE YOU:  Understand these instructions.  Will watch your condition.  Will get help right away if you are not doing well or get worse. Document Released: 03/13/2010 Document Revised: 05/03/2011 Document Reviewed: 03/13/2010 [facility] Patient Information 2013 [place], [place].

## 2012-06-07 NOTE — Assessment & Plan Note (Signed)
Will rx with nystatin cream.

## 2012-06-13 DIAGNOSIS — M1710 Unilateral primary osteoarthritis, unspecified knee: Secondary | ICD-10-CM | POA: Diagnosis not present

## 2012-06-13 DIAGNOSIS — B372 Candidiasis of skin and nail: Secondary | ICD-10-CM | POA: Diagnosis not present

## 2012-06-16 ENCOUNTER — Telehealth: Payer: Self-pay

## 2012-06-16 NOTE — Telephone Encounter (Signed)
Please Advise....  Alprazolam (Xanax) 0.5mg  tablets Take 1 tab by mouth every night at bedtime as needed #30

## 2012-06-17 NOTE — Telephone Encounter (Signed)
Ok to refill Alprazolam with same strength, same sig, #30 1 rf

## 2012-06-19 ENCOUNTER — Other Ambulatory Visit: Payer: Self-pay

## 2012-06-19 MED ORDER — ALPRAZOLAM 0.5 MG PO TABS: ORAL_TABLET | ORAL | Status: DC

## 2012-06-19 NOTE — Telephone Encounter (Signed)
Faxed in Alprazolam (Xanax) 0.5mg  to

## 2012-06-21 ENCOUNTER — Encounter (HOSPITAL_COMMUNITY): Admission: RE | Admit: 2012-06-21 | Payer: Medicare Other | Source: Ambulatory Visit

## 2012-06-23 ENCOUNTER — Ambulatory Visit (INDEPENDENT_AMBULATORY_CARE_PROVIDER_SITE_OTHER): Payer: Medicare Other | Admitting: Cardiology

## 2012-06-23 ENCOUNTER — Encounter: Payer: Self-pay | Admitting: Cardiology

## 2012-06-23 VITALS — BP 140/70 | HR 68 | Wt 185.0 lb

## 2012-06-23 DIAGNOSIS — I10 Essential (primary) hypertension: Secondary | ICD-10-CM | POA: Diagnosis not present

## 2012-06-23 DIAGNOSIS — Z01810 Encounter for preprocedural cardiovascular examination: Secondary | ICD-10-CM

## 2012-06-23 DIAGNOSIS — E785 Hyperlipidemia, unspecified: Secondary | ICD-10-CM | POA: Diagnosis not present

## 2012-06-23 DIAGNOSIS — R011 Cardiac murmur, unspecified: Secondary | ICD-10-CM

## 2012-06-23 NOTE — Assessment & Plan Note (Signed)
Probable systolic ejection murmur. No further evaluation.

## 2012-06-23 NOTE — Progress Notes (Signed)
HPI: 79 year old female for preoperative evaluation prior to knee replacement. Patient is scheduled for left knee replacement and we were asked to evaluate prior to surgery. She has a history of a murmur no other cardiac history. She denies dyspnea on exertion, orthopnea, PND, pedal edema, syncope or exertional chest pain.  Current Outpatient Prescriptions  Medication Sig Dispense Refill  . ALPRAZolam (XANAX) 0.5 MG tablet TAKE 1 TABLET BY MOUTH AT BEDTIME AS NEEDED  30 tablet  1  . atorvastatin (LIPITOR) 40 MG tablet Take 1 tablet (40 mg total) by mouth daily.  30 tablet  3  . citalopram (CELEXA) 20 MG tablet Take 1 tablet (20 mg total) by mouth daily.  30 tablet  6  . furosemide (LASIX) 40 MG tablet Take 40 mg by mouth daily.      Marland Kitchen levothyroxine (SYNTHROID, LEVOTHROID) 75 MCG tablet Take 1 tablet (75 mcg total) by mouth daily.  30 tablet  10  . loratadine (CLARITIN) 10 MG tablet Take 10 mg by mouth daily.      . Multiple Vitamins-Minerals (CENTRUM SILVER PO) Take 1 tablet by mouth daily.      Marland Kitchen trimethoprim (TRIMPEX) 100 MG tablet Take 100 mg by mouth daily.      . verapamil (CALAN-SR) 180 MG CR tablet Take 1 tablet (180 mg total) by mouth at bedtime.  30 tablet  3   No current facility-administered medications for this visit.    Allergies  Allergen Reactions  . Codeine Nausea And Vomiting    Past Medical History  Diagnosis Date  . Diverticulitis   . Arthritis     bilateral knee  . Hypothyroid   . Allergic rhinitis   . Heart murmur   . Hyperlipidemia   . Hypertension     Past Surgical History  Procedure Laterality Date  . Replacement total knee  10/2010    right knee-[facility]  . Bladder tack  04/2010    Dr Marciano Sequin  . Abdominal hysterectomy    . Tonsillectomy      History   Social History  . Marital Status: Widowed    Spouse Name: N/A    Number of Children: 3  . Years of Education: N/A   Occupational History  . Not on file.   Social History Main  Topics  . Smoking status: Never Smoker   . Smokeless tobacco: Never Used  . Alcohol Use: Yes     Comment: rare  . Drug Use: No  . Sexually Active:    Other Topics Concern  . Not on file   Social History Narrative  . No narrative on file    Family History  Problem Relation Age of Onset  . Alcohol abuse Father   . Breast cancer Sister   . Breast cancer Daughter     1 40f 3  . Brain cancer Daughter     1 of 3  . Heart failure Mother   . Prostate cancer Brother     2 of 5  . Colon cancer Neg Hx   . Hypertension Neg Hx   . Diabetes Brother     1 of 5    ROS: left knee arthralgias but no fevers or chills, productive cough, hemoptysis, dysphasia, odynophagia, melena, hematochezia, dysuria, hematuria, rash, seizure activity, orthopnea, PND, pedal edema, claudication. Remaining systems are negative.  Physical Exam:   Blood pressure 140/70, pulse 68, weight 185 lb (83.915 kg).  General:  Well developed/well nourished in NAD Skin warm/dry  Patient not depressed No peripheral clubbing Back-normal HEENT-normal/normal eyelids Neck supple/normal carotid upstroke bilaterally; no bruits; no JVD; no thyromegaly chest - CTA/ normal expansion CV - RRR/normal S1 and S2; no rubs or gallops;  PMI nondisplaced, 1/6 systolic ejection murmur left sternal border. Abdomen -NT/ND, no HSM, no mass, + bowel sounds, no bruit 2+ femoral pulses, no bruits Ext-no edema, chords, 2+ DP Neuro-grossly nonfocal  ECG 05/23/2012-sinus bradycardia, RV conduction delay and nonspecific ST changes.

## 2012-06-23 NOTE — Assessment & Plan Note (Signed)
Continue statin. 

## 2012-06-23 NOTE — Patient Instructions (Addendum)
Your physician recommends that you schedule a follow-up appointment in:  AS NEEDED PENDING TEST RESULTS  Your physician has requested that you have a lexiscan myoview. For further information please visit [email or web address]. Please follow instruction sheet, as given.   

## 2012-06-23 NOTE — Assessment & Plan Note (Signed)
Continue present blood pressure medications. 

## 2012-06-23 NOTE — Assessment & Plan Note (Addendum)
Plan Lexiscan Myoview for risk stratification. 

## 2012-07-03 ENCOUNTER — Telehealth: Payer: Self-pay | Admitting: Family Medicine

## 2012-07-03 ENCOUNTER — Encounter (HOSPITAL_COMMUNITY): Payer: Medicare Other | Attending: Cardiology | Admitting: Radiology

## 2012-07-03 VITALS — BP 135/71 | Ht 62.0 in | Wt 183.0 lb

## 2012-07-03 DIAGNOSIS — I10 Essential (primary) hypertension: Secondary | ICD-10-CM | POA: Diagnosis not present

## 2012-07-03 DIAGNOSIS — R0989 Other specified symptoms and signs involving the circulatory and respiratory systems: Secondary | ICD-10-CM

## 2012-07-03 DIAGNOSIS — Z01810 Encounter for preprocedural cardiovascular examination: Secondary | ICD-10-CM

## 2012-07-03 DIAGNOSIS — R0609 Other forms of dyspnea: Secondary | ICD-10-CM | POA: Diagnosis not present

## 2012-07-03 MED ORDER — VERAPAMIL HCL ER 180 MG PO TBCR: 180.0000 mg | EXTENDED_RELEASE_TABLET | Freq: Every day | ORAL | Status: DC

## 2012-07-03 MED ORDER — TECHNETIUM TC 99M SESTAMIBI GENERIC - CARDIOLITE
11.0000 | Freq: Once | INTRAVENOUS | Status: AC | PRN
  Administered 2012-07-03: 11 via INTRAVENOUS

## 2012-07-03 MED ORDER — REGADENOSON 0.4 MG/5ML IV SOLN
0.4000 mg | Freq: Once | INTRAVENOUS | Status: AC
  Administered 2012-07-03: 0.4 mg via INTRAVENOUS

## 2012-07-03 MED ORDER — TECHNETIUM TC 99M SESTAMIBI GENERIC - CARDIOLITE
33.0000 | Freq: Once | INTRAVENOUS | Status: AC | PRN
  Administered 2012-07-03: 33 via INTRAVENOUS

## 2012-07-03 NOTE — Progress Notes (Signed)
[facility] SITE 3 NUCLEAR MED [address] [place], [place] [address] [phone]    Cardiology Nuclear Med Study  Alyssa Williamson is a 79 y.o. female     MRN : [id number]     DOB: December 25, 1931  Procedure Date: 07/03/2012  Nuclear Med Background Indication for Stress Test:  Evaluation for Ischemia, and Surgical Clearance for  (L) TKR on 07-19-12 by Dr. Eulah Pont History:  No prior Cardiac Hx Cardiac Risk Factors: Hypertension and Lipids  Symptoms:  DOE and Fatigue   Nuclear Pre-Procedure Caffeine/Decaff Intake:  None > 12 hrs NPO After: 9:00pm   Lungs:  clear O2 Sat: 95-98% on room air. IV 0.9% NS with Angio Cath:  22g  IV Site: L Antecubital x 1, tolerated well IV Started by:  Irean Hong, RN  Chest Size (in):  40 Cup Size: DD  Height: 5\' 2"  (1.575 m)  Weight:  183 lb (83.008 kg)  BMI:  Body mass index is 33.46 kg/(m^2). Tech Comments:  n/a    Nuclear Med Study 1 or 2 day study: 1 day  Stress Test Type:  Treadmill/Lexiscan  Reading MD: [name], MD  Order Authorizing Provider:  [name], MD  Resting Radionuclide: Technetium 22m Sestamibi  Resting Radionuclide Dose: 11.0 mCi   Stress Radionuclide:  Technetium 56m Sestamibi  Stress Radionuclide Dose: 33.0 mCi           Stress Protocol Rest HR: 57 Stress HR: 96  Rest BP: 135/71 Stress BP: 154/47  Exercise Time (min): n/a METS: n/a   Predicted Max HR: 141 bpm % Max HR: 68.09 bpm Rate Pressure Product: 40102   Dose of Adenosine (mg):  n/a Dose of Lexiscan: 0.4 mg  Dose of Atropine (mg): n/a Dose of Dobutamine: n/a mcg/kg/min (at max HR)  Stress Test Technologist: Milana Na, EMT-P  Nuclear Technologist:  Domenic Polite, CNMT     Rest Procedure:  Myocardial perfusion imaging was performed at rest 45 minutes following the intravenous administration of Technetium 61m Sestamibi. Rest ECG: Sinus bradycardia with minor nonspecific ST changes.  Stress Procedure:  The patient received IV Lexiscan  0.4 mg over 15-seconds with concurrent low level exercise and then Technetium 44m Sestamibi was injected at 30-seconds while the patient continued walking one more minute. This patient was sob and went into a RBBB while doing an exercise Lexiscan. Quantitative spect images were obtained after a 45-minute delay. Stress ECG: No significant ST segment change suggestive of ischemia.  QPS Raw Data Images:  There is a breast shadow that accounts for the anterior attenuation. Stress Images:  There is decreased uptake in the distal anterior wall and apex. Rest Images:  There is decreased uptake in the distal anterior wall and apex. Subtraction (SDS):  No evidence of ischemia. Transient Ischemic Dilatation (Normal <1.22):  1.03 Lung/Heart Ratio (Normal <0.45):  0.42  Quantitative Gated Spect Images QGS EDV:  69 ml QGS ESV:  14 ml  Impression Exercise Capacity:  Lexiscan with no exercise. BP Response:  Normal blood pressure response. Clinical Symptoms:  There is dyspnea. ECG Impression:  No significant ST segment change suggestive of ischemia. Comparison with Prior Nuclear Study: No images to compare  Overall Impression:  Low risk stress nuclear study with a small, severe intensity, fixed distal anterior and apical defect suggestive of breast attenuation; no ischemia..  LV Ejection Fraction: 79%.  LV Wall Motion:  NL LV Function; NL Wall Motion  [name]

## 2012-07-03 NOTE — Telephone Encounter (Signed)
refill- verapamil er 180mg  tablets. Take one tablet by mouth once daily. Qty 30 last fill 4.10.14

## 2012-07-03 NOTE — Telephone Encounter (Signed)
RX sent

## 2012-07-05 ENCOUNTER — Telehealth: Payer: Self-pay | Admitting: Cardiology

## 2012-07-05 NOTE — Telephone Encounter (Signed)
Spoke with pt, aware of nuclear results 

## 2012-07-05 NOTE — Telephone Encounter (Signed)
New problem   Pt want to know her results for her nuc med test she had done 07/03/12. Please call pt

## 2012-07-12 ENCOUNTER — Other Ambulatory Visit (HOSPITAL_COMMUNITY): Payer: Medicare Other

## 2012-07-18 DIAGNOSIS — M1710 Unilateral primary osteoarthritis, unspecified knee: Secondary | ICD-10-CM | POA: Diagnosis not present

## 2012-07-19 ENCOUNTER — Encounter (HOSPITAL_COMMUNITY): Admission: RE | Payer: Self-pay | Source: Ambulatory Visit

## 2012-07-19 ENCOUNTER — Inpatient Hospital Stay (HOSPITAL_COMMUNITY): Admission: RE | Admit: 2012-07-19 | Payer: Medicare Other | Source: Ambulatory Visit | Admitting: Orthopedic Surgery

## 2012-07-19 SURGERY — ARTHROPLASTY, KNEE, TOTAL
Anesthesia: General | Site: Knee | Laterality: Left

## 2012-07-24 ENCOUNTER — Encounter (HOSPITAL_COMMUNITY): Payer: Self-pay

## 2012-07-24 ENCOUNTER — Ambulatory Visit (HOSPITAL_COMMUNITY)
Admission: RE | Admit: 2012-07-24 | Discharge: 2012-07-24 | Disposition: A | Discharge status: Home / Self Care | Payer: Medicare Other | Source: Ambulatory Visit | Attending: Surgery | Admitting: Surgery

## 2012-07-24 ENCOUNTER — Encounter (HOSPITAL_COMMUNITY)
Admission: RE | Admit: 2012-07-24 | Discharge: 2012-07-24 | Disposition: A | Discharge status: Home / Self Care | Payer: Medicare Other | Source: Ambulatory Visit | Attending: Orthopedic Surgery | Admitting: Orthopedic Surgery

## 2012-07-24 DIAGNOSIS — M47814 Spondylosis without myelopathy or radiculopathy, thoracic region: Secondary | ICD-10-CM | POA: Insufficient documentation

## 2012-07-24 DIAGNOSIS — Z01812 Encounter for preprocedural laboratory examination: Secondary | ICD-10-CM | POA: Insufficient documentation

## 2012-07-24 DIAGNOSIS — Z01818 Encounter for other preprocedural examination: Secondary | ICD-10-CM | POA: Diagnosis not present

## 2012-07-24 DIAGNOSIS — R9431 Abnormal electrocardiogram [ECG] [EKG]: Secondary | ICD-10-CM | POA: Insufficient documentation

## 2012-07-24 DIAGNOSIS — M1710 Unilateral primary osteoarthritis, unspecified knee: Secondary | ICD-10-CM | POA: Insufficient documentation

## 2012-07-24 HISTORY — DX: Anxiety disorder, unspecified: F41.9

## 2012-07-24 HISTORY — DX: Anemia, unspecified: D64.9

## 2012-07-24 HISTORY — DX: Nausea with vomiting, unspecified: R11.2

## 2012-07-24 HISTORY — DX: Other specified postprocedural states: Z98.890

## 2012-07-24 LAB — COMPREHENSIVE METABOLIC PANEL
ALT: 29 U/L (ref 0–35)
AST: 22 U/L (ref 0–37)
Albumin: 3.5 g/dL (ref 3.5–5.2)
Alkaline Phosphatase: 81 U/L (ref 39–117)
Chloride: 96 mEq/L (ref 96–112)
Creatinine, Ser: 0.95 mg/dL (ref 0.50–1.10)
Potassium: 4 mEq/L (ref 3.5–5.1)
Sodium: 134 mEq/L — ABNORMAL LOW (ref 135–145)
Total Bilirubin: 0.5 mg/dL (ref 0.3–1.2)

## 2012-07-24 LAB — URINALYSIS, ROUTINE W REFLEX MICROSCOPIC
Ketones, ur: NEGATIVE mg/dL
Nitrite: NEGATIVE
pH: 6.5 (ref 5.0–8.0)

## 2012-07-24 LAB — URINE MICROSCOPIC-ADD ON

## 2012-07-24 LAB — APTT: aPTT: 27 seconds (ref 24–37)

## 2012-07-24 LAB — CBC
Platelets: 230 10*3/uL (ref 150–400)
RBC: 4.58 MIL/uL (ref 3.87–5.11)
RDW: 12.6 % (ref 11.5–15.5)
WBC: 9.6 10*3/uL (ref 4.0–10.5)

## 2012-07-24 LAB — SURGICAL PCR SCREEN
MRSA, PCR: NEGATIVE
Staphylococcus aureus: NEGATIVE

## 2012-07-24 LAB — TYPE AND SCREEN

## 2012-07-24 LAB — PROTIME-INR: INR: 0.99 (ref 0.00–1.49)

## 2012-07-24 NOTE — Pre-Procedure Instructions (Signed)
BRIE EPPARD  07/24/2012   Your procedure is scheduled on:  Wednesday, June 4th  Report to [facility] at 0830 AM. Come to main entrance "A" and go to east elevators up to 3rd floor. Check in at short stay desk.  Call this number if you have problems the morning of surgery: [phone]   Remember:   Do not eat food or drink liquids after midnight.   Take these medicines the morning of surgery with A SIP OF WATER: Celexa, Synthroid   Do not wear jewelry, make-up or nail polish.  Do not wear lotions, powders, or perfume, deodorant.  Do not shave 48 hours prior to surgery. Men may shave face and neck.  Do not bring valuables to the hospital.  [facility] is not responsible  for any belongings or valuables.  Contacts, dentures or bridgework may not be worn into surgery.  Leave suitcase in the car. After surgery it may be brought to your room.  For patients admitted to the hospital, checkout time is 11:00 AM the day of discharge.   Special Instructions: Shower using CHG 2 nights before surgery and the night before surgery.  If you shower the day of surgery use CHG.  Use special wash - you have one bottle of CHG for all showers.  You should use approximately 1/3 of the bottle for each shower.   Please read over the following fact sheets that you were given: Pain Booklet, Coughing and Deep Breathing, Blood Transfusion Information, MRSA Information and Surgical Site Infection Prevention

## 2012-07-24 NOTE — Progress Notes (Signed)
Primary Physician - Dr. Alfonso Ramus Cardiologist - [place] ekg in epic, stress test in epic

## 2012-07-25 MED ORDER — CEFAZOLIN SODIUM-DEXTROSE 2-3 GM-% IV SOLR
2.0000 g | INTRAVENOUS | Status: AC
  Administered 2012-07-26: 2 g via INTRAVENOUS
  Filled 2012-07-25: qty 50

## 2012-07-26 ENCOUNTER — Encounter (HOSPITAL_COMMUNITY): Payer: Self-pay | Admitting: Anesthesiology

## 2012-07-26 ENCOUNTER — Inpatient Hospital Stay (HOSPITAL_COMMUNITY)
Admission: RE | Admit: 2012-07-26 | Discharge: 2012-07-29 | DRG: 470 | Disposition: A | Discharge status: Skilled Nursing Facility | Payer: Medicare Other | Source: Ambulatory Visit | Attending: Orthopedic Surgery | Admitting: Orthopedic Surgery

## 2012-07-26 ENCOUNTER — Inpatient Hospital Stay (HOSPITAL_COMMUNITY): Payer: Medicare Other

## 2012-07-26 ENCOUNTER — Encounter: Payer: Self-pay | Admitting: Physician Assistant

## 2012-07-26 ENCOUNTER — Inpatient Hospital Stay (HOSPITAL_COMMUNITY): Payer: Medicare Other | Admitting: Anesthesiology

## 2012-07-26 ENCOUNTER — Encounter (HOSPITAL_COMMUNITY)
Admission: RE | Disposition: A | Discharge status: Skilled Nursing Facility | Payer: Self-pay | Source: Ambulatory Visit | Attending: Orthopedic Surgery

## 2012-07-26 ENCOUNTER — Encounter (HOSPITAL_COMMUNITY): Payer: Self-pay | Admitting: *Deleted

## 2012-07-26 ENCOUNTER — Other Ambulatory Visit: Payer: Self-pay | Admitting: Physician Assistant

## 2012-07-26 ENCOUNTER — Other Ambulatory Visit: Payer: Self-pay

## 2012-07-26 DIAGNOSIS — R112 Nausea with vomiting, unspecified: Secondary | ICD-10-CM | POA: Diagnosis present

## 2012-07-26 DIAGNOSIS — F419 Anxiety disorder, unspecified: Secondary | ICD-10-CM | POA: Insufficient documentation

## 2012-07-26 DIAGNOSIS — E039 Hypothyroidism, unspecified: Secondary | ICD-10-CM | POA: Diagnosis present

## 2012-07-26 DIAGNOSIS — Z96659 Presence of unspecified artificial knee joint: Secondary | ICD-10-CM | POA: Diagnosis not present

## 2012-07-26 DIAGNOSIS — K5732 Diverticulitis of large intestine without perforation or abscess without bleeding: Secondary | ICD-10-CM | POA: Diagnosis present

## 2012-07-26 DIAGNOSIS — D649 Anemia, unspecified: Secondary | ICD-10-CM | POA: Diagnosis not present

## 2012-07-26 DIAGNOSIS — F329 Major depressive disorder, single episode, unspecified: Secondary | ICD-10-CM | POA: Diagnosis not present

## 2012-07-26 DIAGNOSIS — Z79899 Other long term (current) drug therapy: Secondary | ICD-10-CM | POA: Diagnosis not present

## 2012-07-26 DIAGNOSIS — M899 Disorder of bone, unspecified: Secondary | ICD-10-CM | POA: Diagnosis present

## 2012-07-26 DIAGNOSIS — M1990 Unspecified osteoarthritis, unspecified site: Secondary | ICD-10-CM | POA: Diagnosis present

## 2012-07-26 DIAGNOSIS — R011 Cardiac murmur, unspecified: Secondary | ICD-10-CM | POA: Diagnosis present

## 2012-07-26 DIAGNOSIS — Y831 Surgical operation with implant of artificial internal device as the cause of abnormal reaction of the patient, or of later complication, without mention of misadventure at the time of the procedure: Secondary | ICD-10-CM | POA: Diagnosis present

## 2012-07-26 DIAGNOSIS — Z5189 Encounter for other specified aftercare: Secondary | ICD-10-CM | POA: Diagnosis not present

## 2012-07-26 DIAGNOSIS — L538 Other specified erythematous conditions: Secondary | ICD-10-CM | POA: Diagnosis present

## 2012-07-26 DIAGNOSIS — E785 Hyperlipidemia, unspecified: Secondary | ICD-10-CM | POA: Diagnosis present

## 2012-07-26 DIAGNOSIS — I10 Essential (primary) hypertension: Secondary | ICD-10-CM | POA: Diagnosis present

## 2012-07-26 DIAGNOSIS — Z7901 Long term (current) use of anticoagulants: Secondary | ICD-10-CM | POA: Diagnosis not present

## 2012-07-26 DIAGNOSIS — Z87440 Personal history of urinary (tract) infections: Secondary | ICD-10-CM | POA: Diagnosis not present

## 2012-07-26 DIAGNOSIS — K5792 Diverticulitis of intestine, part unspecified, without perforation or abscess without bleeding: Secondary | ICD-10-CM | POA: Diagnosis present

## 2012-07-26 DIAGNOSIS — M949 Disorder of cartilage, unspecified: Secondary | ICD-10-CM | POA: Diagnosis present

## 2012-07-26 DIAGNOSIS — Z9889 Other specified postprocedural states: Secondary | ICD-10-CM

## 2012-07-26 DIAGNOSIS — E871 Hypo-osmolality and hyponatremia: Secondary | ICD-10-CM

## 2012-07-26 DIAGNOSIS — M1710 Unilateral primary osteoarthritis, unspecified knee: Secondary | ICD-10-CM | POA: Diagnosis not present

## 2012-07-26 DIAGNOSIS — Y9210 Unspecified residential institution as the place of occurrence of the external cause: Secondary | ICD-10-CM | POA: Diagnosis present

## 2012-07-26 DIAGNOSIS — M1712 Unilateral primary osteoarthritis, left knee: Secondary | ICD-10-CM

## 2012-07-26 DIAGNOSIS — N390 Urinary tract infection, site not specified: Secondary | ICD-10-CM | POA: Diagnosis not present

## 2012-07-26 DIAGNOSIS — F411 Generalized anxiety disorder: Secondary | ICD-10-CM | POA: Diagnosis present

## 2012-07-26 DIAGNOSIS — IMO0002 Reserved for concepts with insufficient information to code with codable children: Secondary | ICD-10-CM | POA: Diagnosis not present

## 2012-07-26 DIAGNOSIS — Z471 Aftercare following joint replacement surgery: Secondary | ICD-10-CM | POA: Diagnosis not present

## 2012-07-26 DIAGNOSIS — L304 Erythema intertrigo: Secondary | ICD-10-CM | POA: Diagnosis present

## 2012-07-26 DIAGNOSIS — R3911 Hesitancy of micturition: Secondary | ICD-10-CM | POA: Diagnosis not present

## 2012-07-26 HISTORY — PX: TOTAL KNEE ARTHROPLASTY: SHX125

## 2012-07-26 LAB — CREATININE, SERUM
Creatinine, Ser: 0.8 mg/dL (ref 0.50–1.10)
GFR calc Af Amer: 79 mL/min — ABNORMAL LOW (ref >90)

## 2012-07-26 LAB — CBC
MCH: 32.1 pg (ref 26.0–34.0)
MCHC: 35.5 g/dL (ref 30.0–36.0)
Platelets: 212 10*3/uL (ref 150–400)
RDW: 12.5 % (ref 11.5–15.5)

## 2012-07-26 SURGERY — ARTHROPLASTY, KNEE, TOTAL
Anesthesia: General | Site: Knee | Laterality: Left | Wound class: Clean

## 2012-07-26 MED ORDER — ROCURONIUM BROMIDE 100 MG/10ML IV SOLN
INTRAVENOUS | Status: DC | PRN
  Administered 2012-07-26: 50 mg via INTRAVENOUS

## 2012-07-26 MED ORDER — SODIUM CHLORIDE 0.9 % IJ SOLN
INTRAMUSCULAR | Status: DC | PRN
  Administered 2012-07-26: 40 mL via INTRAVENOUS

## 2012-07-26 MED ORDER — HYDROMORPHONE HCL PF 1 MG/ML IJ SOLN: 0.5000 mg | INTRAMUSCULAR | Status: DC | PRN

## 2012-07-26 MED ORDER — ONDANSETRON HCL 4 MG/2ML IJ SOLN: 4.0000 mg | Freq: Four times a day (QID) | INTRAMUSCULAR | Status: DC | PRN

## 2012-07-26 MED ORDER — LEVOTHYROXINE SODIUM 75 MCG PO TABS
75.0000 ug | ORAL_TABLET | Freq: Every day | ORAL | Status: DC
  Administered 2012-07-27 – 2012-07-29 (×3): 75 ug via ORAL
  Filled 2012-07-26 (×4): qty 1

## 2012-07-26 MED ORDER — METOCLOPRAMIDE HCL 10 MG PO TABS
5.0000 mg | ORAL_TABLET | Freq: Three times a day (TID) | ORAL | Status: DC | PRN
Start: 2012-07-26 — End: 2012-07-29

## 2012-07-26 MED ORDER — OXYCODONE HCL 5 MG PO TABS
5.0000 mg | ORAL_TABLET | ORAL | Status: DC | PRN
  Administered 2012-07-26: 10 mg via ORAL
  Administered 2012-07-27: 5 mg via ORAL
  Administered 2012-07-27: 10 mg via ORAL
  Administered 2012-07-28: 5 mg via ORAL
  Filled 2012-07-26 (×2): qty 2
  Filled 2012-07-26: qty 1
  Filled 2012-07-26: qty 2

## 2012-07-26 MED ORDER — HYDROMORPHONE HCL PF 1 MG/ML IJ SOLN: 0.2500 mg | INTRAMUSCULAR | Status: DC | PRN

## 2012-07-26 MED ORDER — BISACODYL 5 MG PO TBEC
10.0000 mg | DELAYED_RELEASE_TABLET | Freq: Every day | ORAL | Status: DC
  Administered 2012-07-26 – 2012-07-28 (×3): 10 mg via ORAL
  Filled 2012-07-26 (×3): qty 2

## 2012-07-26 MED ORDER — FENTANYL CITRATE 0.05 MG/ML IJ SOLN
INTRAMUSCULAR | Status: DC | PRN
  Administered 2012-07-26: 150 ug via INTRAVENOUS
  Administered 2012-07-26: 100 ug via INTRAVENOUS

## 2012-07-26 MED ORDER — CITALOPRAM HYDROBROMIDE 20 MG PO TABS
20.0000 mg | ORAL_TABLET | Freq: Every day | ORAL | Status: DC
  Administered 2012-07-26 – 2012-07-29 (×4): 20 mg via ORAL
  Filled 2012-07-26 (×4): qty 1

## 2012-07-26 MED ORDER — ACETAMINOPHEN 325 MG PO TABS
650.0000 mg | ORAL_TABLET | Freq: Four times a day (QID) | ORAL | Status: DC | PRN
  Administered 2012-07-28: 650 mg via ORAL
  Filled 2012-07-26: qty 2

## 2012-07-26 MED ORDER — BUPIVACAINE HCL (PF) 0.25 % IJ SOLN
INTRAMUSCULAR | Status: AC
  Filled 2012-07-26: qty 30

## 2012-07-26 MED ORDER — GLYCOPYRROLATE 0.2 MG/ML IJ SOLN
INTRAMUSCULAR | Status: DC | PRN
  Administered 2012-07-26: 0.4 mg via INTRAVENOUS

## 2012-07-26 MED ORDER — ALPRAZOLAM 0.5 MG PO TABS
0.5000 mg | ORAL_TABLET | Freq: Every day | ORAL | Status: DC
  Administered 2012-07-26 – 2012-07-28 (×3): 0.5 mg via ORAL
  Filled 2012-07-26 (×3): qty 1

## 2012-07-26 MED ORDER — FUROSEMIDE 20 MG PO TABS
20.0000 mg | ORAL_TABLET | Freq: Every day | ORAL | Status: DC
  Administered 2012-07-28 – 2012-07-29 (×2): 20 mg via ORAL
  Filled 2012-07-26 (×2): qty 1

## 2012-07-26 MED ORDER — POTASSIUM CHLORIDE IN NACL 20-0.9 MEQ/L-% IV SOLN
INTRAVENOUS | Status: DC
  Administered 2012-07-26 – 2012-07-28 (×3): via INTRAVENOUS
  Filled 2012-07-26 (×10): qty 1000

## 2012-07-26 MED ORDER — LABETALOL HCL 5 MG/ML IV SOLN
INTRAVENOUS | Status: DC | PRN
  Administered 2012-07-26: 5 mg via INTRAVENOUS

## 2012-07-26 MED ORDER — ATORVASTATIN CALCIUM 40 MG PO TABS
40.0000 mg | ORAL_TABLET | Freq: Every day | ORAL | Status: DC
  Administered 2012-07-26 – 2012-07-29 (×4): 40 mg via ORAL
  Filled 2012-07-26 (×4): qty 1

## 2012-07-26 MED ORDER — NEOSTIGMINE METHYLSULFATE 1 MG/ML IJ SOLN
INTRAMUSCULAR | Status: DC | PRN
  Administered 2012-07-26: 3 mg via INTRAVENOUS

## 2012-07-26 MED ORDER — ENOXAPARIN SODIUM 30 MG/0.3ML ~~LOC~~ SOLN
30.0000 mg | Freq: Two times a day (BID) | SUBCUTANEOUS | Status: DC
  Administered 2012-07-27 – 2012-07-29 (×5): 30 mg via SUBCUTANEOUS
  Filled 2012-07-26 (×7): qty 0.3

## 2012-07-26 MED ORDER — DIPHENHYDRAMINE HCL 12.5 MG/5ML PO ELIX: 12.5000 mg | ORAL_SOLUTION | ORAL | Status: DC | PRN

## 2012-07-26 MED ORDER — DEXAMETHASONE SODIUM PHOSPHATE 4 MG/ML IJ SOLN
INTRAMUSCULAR | Status: DC | PRN
  Administered 2012-07-26: 8 mg via INTRAVENOUS

## 2012-07-26 MED ORDER — BUPIVACAINE HCL (PF) 0.25 % IJ SOLN
INTRAMUSCULAR | Status: AC
  Filled 2012-07-26: qty 10

## 2012-07-26 MED ORDER — OXYCODONE HCL 5 MG/5ML PO SOLN: 5.0000 mg | Freq: Once | ORAL | Status: DC | PRN

## 2012-07-26 MED ORDER — SODIUM CHLORIDE 0.9 % IR SOLN
Status: DC | PRN
  Administered 2012-07-26: 3000 mL

## 2012-07-26 MED ORDER — LACTATED RINGERS IV SOLN
INTRAVENOUS | Status: DC
  Administered 2012-07-26: 11:00:00 via INTRAVENOUS

## 2012-07-26 MED ORDER — MENTHOL 3 MG MT LOZG: 1.0000 | LOZENGE | OROMUCOSAL | Status: DC | PRN

## 2012-07-26 MED ORDER — OXYCODONE HCL 5 MG PO TABS: 5.0000 mg | ORAL_TABLET | Freq: Once | ORAL | Status: DC | PRN

## 2012-07-26 MED ORDER — ONDANSETRON HCL 4 MG PO TABS: 4.0000 mg | ORAL_TABLET | Freq: Four times a day (QID) | ORAL | Status: DC | PRN

## 2012-07-26 MED ORDER — ACETAMINOPHEN 650 MG RE SUPP: 650.0000 mg | Freq: Four times a day (QID) | RECTAL | Status: DC | PRN

## 2012-07-26 MED ORDER — ARTIFICIAL TEARS OP OINT
TOPICAL_OINTMENT | OPHTHALMIC | Status: DC | PRN
  Administered 2012-07-26: 1 via OPHTHALMIC

## 2012-07-26 MED ORDER — PHENOL 1.4 % MT LIQD: 1.0000 | OROMUCOSAL | Status: DC | PRN

## 2012-07-26 MED ORDER — VERAPAMIL HCL ER 180 MG PO TBCR
180.0000 mg | EXTENDED_RELEASE_TABLET | Freq: Every day | ORAL | Status: DC
  Administered 2012-07-26 – 2012-07-28 (×3): 180 mg via ORAL
  Filled 2012-07-26 (×4): qty 1

## 2012-07-26 MED ORDER — LACTATED RINGERS IV SOLN
INTRAVENOUS | Status: DC | PRN
  Administered 2012-07-26 (×2): via INTRAVENOUS

## 2012-07-26 MED ORDER — DOCUSATE SODIUM 100 MG PO CAPS
100.0000 mg | ORAL_CAPSULE | Freq: Two times a day (BID) | ORAL | Status: DC
  Administered 2012-07-26 – 2012-07-29 (×6): 100 mg via ORAL
  Filled 2012-07-26 (×7): qty 1

## 2012-07-26 MED ORDER — ONDANSETRON HCL 4 MG/2ML IJ SOLN
INTRAMUSCULAR | Status: DC | PRN
  Administered 2012-07-26: 4 mg via INTRAVENOUS

## 2012-07-26 MED ORDER — DEXAMETHASONE SODIUM PHOSPHATE 10 MG/ML IJ SOLN
10.0000 mg | Freq: Three times a day (TID) | INTRAMUSCULAR | Status: AC
  Administered 2012-07-26 – 2012-07-27 (×3): 10 mg via INTRAVENOUS
  Filled 2012-07-26 (×3): qty 1

## 2012-07-26 MED ORDER — ADULT MULTIVITAMIN W/MINERALS CH
1.0000 | ORAL_TABLET | Freq: Every day | ORAL | Status: DC
  Administered 2012-07-27 – 2012-07-29 (×3): 1 via ORAL
  Filled 2012-07-26 (×3): qty 1

## 2012-07-26 MED ORDER — BUPIVACAINE HCL (PF) 0.25 % IJ SOLN
INTRAMUSCULAR | Status: DC | PRN
  Administered 2012-07-26: 10 mL

## 2012-07-26 MED ORDER — BUPIVACAINE LIPOSOME 1.3 % IJ SUSP
20.0000 mL | Freq: Once | INTRAMUSCULAR | Status: AC
  Administered 2012-07-26: 20 mL
  Filled 2012-07-26: qty 20

## 2012-07-26 MED ORDER — CEFAZOLIN SODIUM-DEXTROSE 2-3 GM-% IV SOLR
2.0000 g | Freq: Four times a day (QID) | INTRAVENOUS | Status: AC
  Administered 2012-07-26 (×2): 2 g via INTRAVENOUS
  Filled 2012-07-26 (×2): qty 50

## 2012-07-26 MED ORDER — CELECOXIB 200 MG PO CAPS
200.0000 mg | ORAL_CAPSULE | Freq: Two times a day (BID) | ORAL | Status: DC
  Administered 2012-07-26 – 2012-07-29 (×6): 200 mg via ORAL
  Filled 2012-07-26 (×7): qty 1

## 2012-07-26 MED ORDER — ALUM & MAG HYDROXIDE-SIMETH 200-200-20 MG/5ML PO SUSP
30.0000 mL | ORAL | Status: DC | PRN
  Administered 2012-07-27 – 2012-07-29 (×2): 30 mL via ORAL
  Filled 2012-07-26 (×2): qty 30

## 2012-07-26 MED ORDER — METOCLOPRAMIDE HCL 5 MG/ML IJ SOLN: 10.0000 mg | Freq: Once | INTRAMUSCULAR | Status: DC | PRN

## 2012-07-26 MED ORDER — METOCLOPRAMIDE HCL 5 MG/ML IJ SOLN: 5.0000 mg | Freq: Three times a day (TID) | INTRAMUSCULAR | Status: DC | PRN

## 2012-07-26 MED ORDER — LORATADINE 10 MG PO TABS
10.0000 mg | ORAL_TABLET | Freq: Every day | ORAL | Status: DC
  Administered 2012-07-26 – 2012-07-29 (×4): 10 mg via ORAL
  Filled 2012-07-26 (×4): qty 1

## 2012-07-26 MED ORDER — PROPOFOL 10 MG/ML IV BOLUS
INTRAVENOUS | Status: DC | PRN
  Administered 2012-07-26: 120 mg via INTRAVENOUS

## 2012-07-26 MED ORDER — LIDOCAINE HCL (CARDIAC) 20 MG/ML IV SOLN
INTRAVENOUS | Status: DC | PRN
  Administered 2012-07-26: 100 mg via INTRAVENOUS

## 2012-07-26 MED ORDER — DEXAMETHASONE 4 MG PO TABS
10.0000 mg | ORAL_TABLET | Freq: Three times a day (TID) | ORAL | Status: AC
  Filled 2012-07-26 (×3): qty 1

## 2012-07-26 MED ORDER — SODIUM CHLORIDE 0.9 % IJ SOLN
INTRAMUSCULAR | Status: AC
  Filled 2012-07-26: qty 12

## 2012-07-26 MED ORDER — ZOLPIDEM TARTRATE 5 MG PO TABS: 5.0000 mg | ORAL_TABLET | Freq: Every evening | ORAL | Status: DC | PRN

## 2012-07-26 SURGICAL SUPPLY — 58 items
BANDAGE ESMARK 6X9 LF (GAUZE/BANDAGES/DRESSINGS) ×1 IMPLANT
BLADE SAG 18X100X1.27 (BLADE) ×4 IMPLANT
BNDG CMPR 9X6 STRL LF SNTH (GAUZE/BANDAGES/DRESSINGS) ×1
BNDG ESMARK 6X9 LF (GAUZE/BANDAGES/DRESSINGS) ×2
BOWL SMART MIX CTS (DISPOSABLE) ×2 IMPLANT
CEMENT BONE SIMPLEX SPEEDSET (Cement) ×4 IMPLANT
CLOTH BEACON ORANGE TIMEOUT ST (SAFETY) ×2 IMPLANT
COVER SURGICAL LIGHT HANDLE (MISCELLANEOUS) ×2 IMPLANT
CUFF TOURNIQUET SINGLE 34IN LL (TOURNIQUET CUFF) ×2 IMPLANT
DRAPE EXTREMITY T 121X128X90 (DRAPE) ×2 IMPLANT
DRAPE PROXIMA HALF (DRAPES) ×2 IMPLANT
DRAPE U-SHAPE 47X51 STRL (DRAPES) ×2 IMPLANT
DRSG PAD ABDOMINAL 8X10 ST (GAUZE/BANDAGES/DRESSINGS) ×2 IMPLANT
DURAPREP 26ML APPLICATOR (WOUND CARE) ×2 IMPLANT
ELECT CAUTERY BLADE 6.4 (BLADE) ×2 IMPLANT
ELECT REM PT RETURN 9FT ADLT (ELECTROSURGICAL) ×2
ELECTRODE REM PT RTRN 9FT ADLT (ELECTROSURGICAL) ×1 IMPLANT
EVACUATOR 1/8 PVC DRAIN (DRAIN) ×2 IMPLANT
FACESHIELD LNG OPTICON STERILE (SAFETY) ×2 IMPLANT
GAUZE XEROFORM 5X9 LF (GAUZE/BANDAGES/DRESSINGS) ×2 IMPLANT
GLOVE BIOGEL PI IND STRL 8 (GLOVE) ×1 IMPLANT
GLOVE BIOGEL PI INDICATOR 8 (GLOVE)
GLOVE ORTHO TXT STRL SZ7.5 (GLOVE) ×2 IMPLANT
GOWN PREVENTION PLUS XLARGE (GOWN DISPOSABLE) ×4 IMPLANT
GOWN STRL NON-REIN LRG LVL3 (GOWN DISPOSABLE) ×4 IMPLANT
GOWN STRL REIN 2XL XLG LVL4 (GOWN DISPOSABLE) ×2 IMPLANT
HANDPIECE INTERPULSE COAX TIP (DISPOSABLE) ×2
IMMOBILIZER KNEE 22 UNIV (SOFTGOODS) ×2 IMPLANT
IMMOBILIZER KNEE 24 THIGH 36 (MISCELLANEOUS) IMPLANT
IMMOBILIZER KNEE 24 UNIV (MISCELLANEOUS)
KIT BASIN OR (CUSTOM PROCEDURE TRAY) ×2 IMPLANT
KIT ROOM TURNOVER OR (KITS) ×2 IMPLANT
KNEE/VIT E POLY LINER LEVEL 1B ×1 IMPLANT
MANIFOLD NEPTUNE II (INSTRUMENTS) ×2 IMPLANT
NDL 18GX1X1/2 (RX/OR ONLY) (NEEDLE) ×1 IMPLANT
NDL HYPO 25GX1X1/2 BEV (NEEDLE) ×1 IMPLANT
NEEDLE 18GX1X1/2 (RX/OR ONLY) (NEEDLE) ×2 IMPLANT
NEEDLE HYPO 25GX1X1/2 BEV (NEEDLE) ×2 IMPLANT
NS IRRIG 1000ML POUR BTL (IV SOLUTION) ×2 IMPLANT
PACK TOTAL JOINT (CUSTOM PROCEDURE TRAY) ×2 IMPLANT
PAD ARMBOARD 7.5X6 YLW CONV (MISCELLANEOUS) ×4 IMPLANT
PAD CAST 4YDX4 CTTN HI CHSV (CAST SUPPLIES) ×1 IMPLANT
PADDING CAST COTTON 4X4 STRL (CAST SUPPLIES) ×2
PADDING CAST COTTON 6X4 STRL (CAST SUPPLIES) ×2 IMPLANT
RUBBERBAND STERILE (MISCELLANEOUS) ×1 IMPLANT
SET HNDPC FAN SPRY TIP SCT (DISPOSABLE) ×1 IMPLANT
SPONGE GAUZE 4X4 12PLY (GAUZE/BANDAGES/DRESSINGS) ×2 IMPLANT
STAPLER VISISTAT 35W (STAPLE) ×1 IMPLANT
SUCTION FRAZIER TIP 10 FR DISP (SUCTIONS) ×2 IMPLANT
SUT VIC AB 1 CTX 36 (SUTURE) ×4
SUT VIC AB 1 CTX36XBRD ANBCTR (SUTURE) ×2 IMPLANT
SUT VIC AB 2-0 CT1 27 (SUTURE) ×2
SUT VIC AB 2-0 CT1 TAPERPNT 27 (SUTURE) ×2 IMPLANT
SYR CONTROL 10ML LL (SYRINGE) ×2 IMPLANT
TOWEL OR 17X24 6PK STRL BLUE (TOWEL DISPOSABLE) ×2 IMPLANT
TOWEL OR 17X26 10 PK STRL BLUE (TOWEL DISPOSABLE) ×2 IMPLANT
TRAY FOLEY CATH 14FR (SET/KITS/TRAYS/PACK) ×2 IMPLANT
WATER STERILE IRR 1000ML POUR (IV SOLUTION) ×4 IMPLANT

## 2012-07-26 NOTE — Preoperative (Signed)
Beta Blockers   Reason not to administer Beta Blockers:Not Applicable 

## 2012-07-26 NOTE — Anesthesia Postprocedure Evaluation (Signed)
Anesthesia Post Note  Patient: Alyssa Williamson  Procedure(s) Performed: Procedure(s) (LRB): TOTAL KNEE ARTHROPLASTY (Left)  Anesthesia type: general  Patient location: PACU  Post pain: Pain level controlled  Post assessment: Patient's Cardiovascular Status Stable  Last Vitals:  Filed Vitals:   07/26/12 1345  BP: 166/50  Pulse: 58  Temp:   Resp: 17    Post vital signs: Reviewed and stable  Level of consciousness: sedated  Complications: No apparent anesthesia complications

## 2012-07-26 NOTE — Transfer of Care (Signed)
Immediate Anesthesia Transfer of Care Note  Patient: Alyssa Williamson  Procedure(s) Performed: Procedure(s): TOTAL KNEE ARTHROPLASTY (Left)  Patient Location: PACU  Anesthesia Type:General  Level of Consciousness: awake, alert  and oriented  Airway & Oxygen Therapy: Patient Spontanous Breathing and Patient connected to face mask oxygen  Post-op Assessment: Report given to PACU RN  Post vital signs: Reviewed and stable  Complications: No apparent anesthesia complications

## 2012-07-26 NOTE — H&P (Signed)
  End stage DJD left knee. Plan Left Total knee replacement. Procedure, risks, benefits, complications reviewed. All questions answered.

## 2012-07-26 NOTE — Progress Notes (Signed)
Orthopedic Tech Progress Note Patient Details:  Alyssa Williamson 1931/10/05 [id number]  CPM Left Knee CPM Left Knee: On Left Knee Flexion (Degrees): 60 Left Knee Extension (Degrees): 0 Additional Comments: Trapeze bar   Cammer, Mickie Bail 07/26/2012, 2:26 PM

## 2012-07-26 NOTE — Progress Notes (Addendum)
Cannot put up ekg in epic done 05/23/12 from dr. Mariel Aloe office. Called office and they cannot get in to the ekg either. Repeat ekg done this am and placed in chart.   Also pt. Stated she is prone to UTI's and starting having some burning when urinating yesterday. Notified dr. Eulah Pont, urine culture still pending.

## 2012-07-26 NOTE — Anesthesia Preprocedure Evaluation (Signed)
Anesthesia Evaluation  Patient identified by MRN, date of birth, ID band Patient awake    Reviewed: Allergy & Precautions, H&P , NPO status , Patient's Chart, lab work & pertinent test results, reviewed documented beta blocker date and time   History of Anesthesia Complications (+) PONV  Airway Mallampati: II TM Distance: >3 FB Neck ROM: full    Dental   Pulmonary neg pulmonary ROS,  breath sounds clear to auscultation        Cardiovascular hypertension, On Medications negative cardio ROS  + Valvular Problems/Murmurs Rhythm:regular     Neuro/Psych negative neurological ROS  negative psych ROS   GI/Hepatic negative GI ROS, Neg liver ROS,   Endo/Other  Hypothyroidism   Renal/GU negative Renal ROS  negative genitourinary   Musculoskeletal   Abdominal   Peds  Hematology  (+) anemia ,   Anesthesia Other Findings See surgeon's H&P   Reproductive/Obstetrics negative OB ROS                           Anesthesia Physical Anesthesia Plan  ASA: II  Anesthesia Plan: General   Post-op Pain Management:    Induction: Intravenous  Airway Management Planned: Oral ETT  Additional Equipment:   Intra-op Plan:   Post-operative Plan: Extubation in OR  Informed Consent: I have reviewed the patients History and Physical, chart, labs and discussed the procedure including the risks, benefits and alternatives for the proposed anesthesia with the patient or authorized representative who has indicated his/her understanding and acceptance.   Dental Advisory Given  Plan Discussed with: CRNA and Surgeon  Anesthesia Plan Comments:         Anesthesia Quick Evaluation

## 2012-07-26 NOTE — H&P (Signed)
TOTAL KNEE ADMISSION H&P  Patient is being admitted for left total knee arthroplasty.  Subjective:  Chief Complaint:left knee pain.  HPI: Alyssa Williamson, 79 y.o. female, has a history of pain and functional disability in the left knee due to arthritis and has failed non-surgical conservative treatments for greater than 12 weeks to includeNSAID's and/or analgesics, corticosteriod injections, viscosupplementation injections, flexibility and strengthening excercises, supervised PT with diminished ADL's post treatment, use of assistive devices, weight reduction as appropriate and activity modification.  Onset of symptoms was gradual, starting 10 years ago with gradually worsening course since that time. The patient noted no past surgery on the left knee(s).  Patient currently rates pain in the left knee(s) at 8 out of 10 with activity. Patient has night pain, worsening of pain with activity and weight bearing, pain that interferes with activities of daily living, crepitus and joint swelling.  Patient has evidence of subchondral sclerosis, periarticular osteophytes and joint space narrowing by imaging studies.  There is no active infection.  Patient Active Problem List   Diagnosis Date Noted  . Diverticulitis   . Arthritis   . Hypothyroid   . Heart murmur   . Hypertension   . PONV (postoperative nausea and vomiting)   . Anxiety   . Anemia   . Left knee DJD   . Preop cardiovascular exam 06/23/2012  . Intertrigo 06/07/2012  . Arthritis of knee 05/24/2012  . Hypothyroidism 06/27/2011  . HTN (hypertension) 06/27/2011  . Hyperlipidemia 06/27/2011  . Recurrent UTI 06/27/2011  . Murmur 06/27/2011   Past Medical History  Diagnosis Date  . Diverticulitis   . Arthritis     bilateral knee  . Hypothyroid   . Allergic rhinitis   . Heart murmur   . Hyperlipidemia   . Hypertension   . PONV (postoperative nausea and vomiting)   . Anxiety   . Anemia   . Left knee DJD     Past Surgical History   Procedure Laterality Date  . Replacement total knee  10/2010    right knee-[facility]   . Bladder tack  04/2010    Dr Marciano Sequin  . Abdominal hysterectomy    . Tonsillectomy    . Joint replacement Right   . Ganglion cyst excision       (Not in a hospital admission) Allergies  Allergen Reactions  . Codeine Nausea And Vomiting    Current Facility-Administered Medications on File Prior to Visit  Medication Dose Route Frequency Provider Last Rate Last Dose  . ceFAZolin (ANCEF) IVPB 2 g/50 mL premix  2 g Intravenous On Call to OR Naida Sleight, PA-C      . lactated ringers infusion   Intravenous Continuous Hart Robinsons, MD 50 mL/hr at 07/26/12 1046    . lactated ringers infusion    Continuous PRN Kristopher Key, CRNA       Current Outpatient Prescriptions on File Prior to Visit  Medication Sig Dispense Refill  . ALPRAZolam (XANAX) 0.5 MG tablet Take 0.5 mg by mouth at bedtime.      Marland Kitchen atorvastatin (LIPITOR) 40 MG tablet Take 40 mg by mouth daily.      . citalopram (CELEXA) 20 MG tablet Take 20 mg by mouth daily.      . furosemide (LASIX) 40 MG tablet Take 20 mg by mouth daily.       Marland Kitchen levothyroxine (SYNTHROID, LEVOTHROID) 75 MCG tablet Take 75 mcg by mouth daily.      Marland Kitchen loratadine (CLARITIN) 10  MG tablet Take 10 mg by mouth daily.      . Multiple Vitamins-Minerals (CENTRUM SILVER PO) Take 1 tablet by mouth daily.      Marland Kitchen trimethoprim (TRIMPEX) 100 MG tablet Take 100 mg by mouth daily after supper.      . verapamil (CALAN-SR) 180 MG CR tablet Take 180 mg by mouth at bedtime.         History  Substance Use Topics  . Smoking status: Never Smoker   . Smokeless tobacco: Never Used  . Alcohol Use: Yes     Comment: rare    Family History  Problem Relation Age of Onset  . Alcohol abuse Father   . Breast cancer Sister   . Breast cancer Daughter     1 40f 3  . Brain cancer Daughter     1 of 3  . Heart failure Mother   . Prostate cancer Brother     2 of 5  . Colon cancer Neg  Hx   . Hypertension Neg Hx   . Diabetes Brother     1 of 5     Review of Systems  Constitutional: Negative.   HENT: Negative.   Eyes: Negative.   Respiratory: Negative.   Cardiovascular: Negative.   Genitourinary: Positive for dysuria, urgency and frequency.  Musculoskeletal: Positive for myalgias and joint pain.  Skin: Negative.   Neurological: Negative.   Endo/Heme/Allergies: Negative.   Psychiatric/Behavioral: Negative.     Objective:  Physical Exam  Constitutional: She is oriented to person, place, and time. She appears well-developed and well-nourished.  HENT:  Head: Normocephalic and atraumatic.  Eyes: Conjunctivae and EOM are normal. Pupils are equal, round, and reactive to light.  Neck: Normal range of motion. Neck supple.  Cardiovascular: Normal rate and regular rhythm.   Murmur heard. Respiratory: Effort normal and breath sounds normal. No respiratory distress. She has no wheezes. She has no rales.  GI: Soft. Bowel sounds are normal. She exhibits no distension. There is no tenderness.  Genitourinary:  Not pertinent to current symptomatology therefore not examined.  Musculoskeletal:  Patient is independently ambulatory with a significantly antalgic gait with a valgus deformity of her left knee.  She has active ROM 6-90 degrees.  2+crepitus.  2+ synovitis.  Lateral joint line tenderness.  Normal patella tracking.  She is ligamentously stable. Right knee has full range of motion.  Well healed total knee incision.   2+ DP pulses bilaterally equally motor and sensory exam  Neurological: She is alert and oriented to person, place, and time. She has normal reflexes.  Skin: Skin is warm and dry.  Psychiatric: She has a normal mood and affect.    Vital signs in last 24 hours: BP: 163/85 Pulse: 68 Temp: 97.8 F (36.6 C) Height: 5\' 2"  (1.575 m) SpO2: 94 Weight: 83.553 kg (184 lb 3.2 oz)   Labs:   Estimated body mass index is 33.68 kg/(m^2) as calculated from the  following:   Height as of 07/24/12: 5\' 2"  (1.575 m).   Weight as of 07/24/12: 83.553 kg (184 lb 3.2 oz).   Imaging Review Plain radiographs demonstrate severe degenerative joint disease of the left knee(s). The overall alignment issignificant valgus. The bone quality appears to be fair for age and reported activity level.  Assessment/Plan:  End stage arthritis, left knee   The patient history, physical examination, clinical judgment of the provider and imaging studies are consistent with end stage degenerative joint disease of the left knee(s) and total  knee arthroplasty is deemed medically necessary. The treatment options including medical management, injection therapy arthroscopy and arthroplasty were discussed at length. The risks and benefits of total knee arthroplasty were presented and reviewed. The risks due to aseptic loosening, infection, stiffness, patella tracking problems, thromboembolic complications and other imponderables were discussed. The patient acknowledged the explanation, agreed to proceed with the plan and consent was signed. Patient is being admitted for inpatient treatment for surgery, pain control, PT, OT, prophylactic antibiotics, VTE prophylaxis, progressive ambulation and ADL's and discharge planning. The patient is planning to be discharged to skilled nursing facility [facility] in [facility]  Timothea Bodenheimer A. Gwinda Passe Physician Assistant [facility] [phone]  07/26/2012, 11:11 AM

## 2012-07-27 LAB — CBC
HCT: 30.3 % — ABNORMAL LOW (ref 36.0–46.0)
Hemoglobin: 10.8 g/dL — ABNORMAL LOW (ref 12.0–15.0)
MCH: 32.5 pg (ref 26.0–34.0)
MCHC: 35.6 g/dL (ref 30.0–36.0)
MCV: 91.3 fL (ref 78.0–100.0)
Platelets: 204 10*3/uL (ref 150–400)
RBC: 3.32 MIL/uL — ABNORMAL LOW (ref 3.87–5.11)
RDW: 13.1 % (ref 11.5–15.5)
WBC: 12.2 10*3/uL — ABNORMAL HIGH (ref 4.0–10.5)

## 2012-07-27 LAB — BASIC METABOLIC PANEL
CO2: 22 mEq/L (ref 19–32)
Chloride: 98 mEq/L (ref 96–112)
GFR calc Af Amer: >90 mL/min (ref >90)
Potassium: 4.7 mEq/L (ref 3.5–5.1)
Sodium: 129 mEq/L — ABNORMAL LOW (ref 135–145)

## 2012-07-27 NOTE — Progress Notes (Signed)
Utilization review completed.  

## 2012-07-27 NOTE — Clinical Social Work Placement (Addendum)
Clinical Social Work Department  CLINICAL SOCIAL WORK PLACEMENT NOTE  07/27/2012  Patient: [name] Account Number: [id number]  Admit date: 07/26/12  Clinical Social Worker: Sabino Niemann MSW Date/time: 07/27/2012 11:30 AM  Clinical Social Work is seeking post-discharge placement for this patient at the following level of care: SKILLED NURSING (*CSW will update this form in Epic as items are completed)  07/27/2012 Patient/family provided with [facility] Department of Clinical Social Work's list of facilities offering this level of care within the geographic area requested by the patient (or if unable, by the patient's family).  07/27/2012 Patient/family informed of their freedom to choose among providers that offer the needed level of care, that participate in Medicare, Medicaid or managed care program needed by the patient, have an available bed and are willing to accept the patient.  07/27/2012 Patient/family informed of [facility]' ownership interest in [facility], as well as of the fact that they are under no obligation to receive care at this facility.  PASARR submitted to EDS on 07/28/2012 PASARR number received from EDS on 07/28/2012 FL2 transmitted to all facilities in geographic area requested by pt/family on 07/27/2012  FL2 transmitted to all facilities within larger geographic area on  Patient informed that his/her managed care company has contracts with or will negotiate with certain facilities, including the following:  Patient/family informed of bed offers received: 07/28/2012 Patient chooses bed at [facility] Physician recommends and patient chooses bed at  Patient to be transferred to on 07/29/12 Patient to be transferred to facility by dtr The following physician request were entered in Epic:  Additional Comments:

## 2012-07-27 NOTE — Clinical Social Work Psychosocial (Signed)
Clinical Social Work Department  BRIEF PSYCHOSOCIAL ASSESSMENT  Patient:Alyssa Williamson  Account Number: [id number] Admit date: 07/26/12 Clinical Social Worker Sabino Niemann, MSW Date/Time:  07/27/2012 3:30 PM Referred by: Physician Date Referred:  Referred for   SNF Placement   Other Referral:  Interview type: Patient  Other interview type: PSYCHOSOCIAL DATA  Living Status: Alone Admitted from facility:  Level of care:  Primary support name: Schmidinger,Judith Primary support relationship to patient: Daughter Degree of support available:  Strong and vested  CURRENT CONCERNS  Current Concerns   Post-Acute Placement   Other Concerns:  SOCIAL WORK ASSESSMENT / PLAN  CSW met with pt re: PT recommendation for SNF.   Pt lives [place] in [place]  CSW explained placement process and answered questions.   Pt reports [facility]  as her preference    CSW completed FL2 and initiated SNF search.     Assessment/plan status: Information/Referral to [facility]  Other assessment/ plan:  Information/referral to community resources:  SNF   Patient is going to have a friend transport her to the facility  PATIENT'S/FAMILY'S RESPONSE TO PLAN OF CARE:  Pt  reports she is agreeable to ST SNF in order to increase strength and independence with mobility prior to returning home  Pt verbalized understanding of placement process and appreciation for CSW assist.   Sabino Niemann, MSW [phone]

## 2012-07-27 NOTE — Evaluation (Signed)
Physical Therapy Evaluation Patient Details Name: Alyssa Williamson MRN: [id number] DOB: May 14, 1931 Today's Date: 07/27/2012 Time: 7829-5621 PT Time Calculation (min): 27 min  PT Assessment / Plan / Recommendation Clinical Impression  79 y.o. female who is POD #1 for L TKA. Pt ambulated 140' with RW with supervision, performed ther ex for L knee. Excellent mobility.  Pt plans to DC to [facility]. She would benefit from acute PT to maximize safety and independence with mobility.     PT Assessment  Patient needs continued PT services    Follow Up Recommendations  SNF    Does the patient have the potential to tolerate intense rehabilitation      Barriers to Discharge None      Equipment Recommendations  None recommended by PT    Recommendations for Other Services     Frequency 7X/week    Precautions / Restrictions Precautions Required Braces or Orthoses: Knee Immobilizer - Left Restrictions Weight Bearing Restrictions: No LLE Weight Bearing: Weight bearing as tolerated   Pertinent Vitals/Pain **2/10 L knee Premedicated, ice applied*      Mobility  Bed Mobility Bed Mobility: Supine to Sit Supine to Sit: 6: Modified independent (Device/Increase time);With rails Transfers Transfers: Sit to Stand;Stand to Sit Sit to Stand: 5: Supervision;From bed;With upper extremity assist Stand to Sit: 5: Supervision;To chair/3-in-1;With armrests;With upper extremity assist Details for Transfer Assistance: VCs hand placement Ambulation/Gait Ambulation/Gait Assistance: 5: Supervision Ambulation Distance (Feet): 140 Feet Assistive device: Rolling walker Ambulation/Gait Assistance Details: VCs for flexed neck Gait Pattern: Step-to pattern Gait velocity: [facility] [place] [facility] [place]    Exercises Total Joint Exercises Ankle Circles/Pumps: AROM;10 reps;Both Quad Sets: AROM;Both;10 reps Short Arc Quad: AROM;Left;10 reps Heel Slides: AAROM;Left;10 reps Hip ABduction/ADduction: AROM;Left;10 reps Long Arc Quad:  AROM;Left;10 reps Goniometric ROM: knee flexion 80* AAROM, ext -3* AAROM   PT Diagnosis: Acute pain;Difficulty walking  PT Problem List: Decreased range of motion;Decreased activity tolerance;Decreased mobility;Pain PT Treatment Interventions: DME instruction;Gait training;Functional mobility training;Therapeutic activities;Therapeutic exercise;Patient/family education   PT Goals Acute Rehab PT Goals PT Goal Formulation: With patient Time For Goal Achievement: 08/03/12 Potential to Achieve Goals: Good Pt will go Sit to Stand: with modified independence PT Goal: Sit to Stand - Progress: Goal set today Pt will Ambulate: >150 feet;with modified independence;with rolling walker PT Goal: Ambulate - Progress: Goal set today Pt will Go Up / Down Stairs: 3-5 stairs;with supervision;with rail(s) PT Goal: Up/Down Stairs - Progress: Goal set today Pt will Perform Home Exercise Program: Independently PT Goal: Perform Home Exercise Program - Progress: Goal set today  Visit Information  Last PT Received On: 07/27/12 Assistance Needed: +1    Subjective Data  Subjective: I did really well with my other knee replacement.  Patient Stated Goal: DC to [facility]   Prior Functioning  Home Living Lives With: Alone Home Access: Stairs to enter Entrance Stairs-Number of Steps: 4 Entrance Stairs-Rails: Can reach both;Left;Right Home Layout: One level Home Adaptive Equipment: Walker - rolling;Straight cane;Built-in shower seat;Raised toilet seat with rails Prior Function Level of Independence: Independent Able to Take Stairs?: Yes Driving: Yes Communication Communication: No difficulties    Cognition  Cognition Arousal/Alertness: Awake/alert Behavior During Therapy: WFL for tasks assessed/performed Overall Cognitive Status: Within Functional Limits for tasks assessed    Extremity/Trunk Assessment Right Upper Extremity Assessment RUE ROM/Strength/Tone: [facility] for tasks assessed Left Upper  Extremity Assessment LUE ROM/Strength/Tone: WFL for tasks assessed Right Lower Extremity Assessment RLE ROM/Strength/Tone: Within functional levels RLE Sensation: WFL - Light Touch RLE Coordination: WFL -  gross/fine motor Left Lower Extremity Assessment LLE ROM/Strength/Tone: Deficits LLE ROM/Strength/Tone Deficits: L knee flexion 80* AAROM, ext -3*, pt able to do SLR, strong quad set, ankle DF WNL LLE Sensation: WFL - Light Touch LLE Coordination: WFL - gross/fine motor   Balance    End of Session PT - End of Session Activity Tolerance: Patient tolerated treatment well Patient left: in chair;with call bell/phone within reach Nurse Communication: Mobility status CPM Left Knee CPM Left Knee: Off  GP     Ralene Bathe Kistler 07/27/2012, 12:46 PM [phone]

## 2012-07-27 NOTE — Progress Notes (Signed)
Physical Therapy Treatment Patient Details Name: Alyssa Williamson MRN: [id number] DOB: 1931/05/18 Today's Date: 07/27/2012 Time: 4098-1191 PT Time Calculation (min): 26 min  PT Assessment / Plan / Recommendation Comments on Treatment Session  pt progressing well with therapy. Pt able to increase amb and inddpendence with mobility. Will cont to f/u with pt to maximize functional mobility and ensure safe transition to ST-SNF prior to returning home     Follow Up Recommendations  SNF     Does the patient have the potential to tolerate intense rehabilitation     Barriers to Discharge None      Equipment Recommendations  None recommended by PT    Recommendations for Other Services    Frequency 7X/week   Plan Discharge plan remains appropriate;Frequency remains appropriate    Precautions / Restrictions Precautions Precautions: Knee;Fall Precaution Comments: given handout on knee exercise protocol Required Braces or Orthoses: Knee Immobilizer - Left Knee Immobilizer - Left: On when out of bed or walking Restrictions Weight Bearing Restrictions: No LLE Weight Bearing: Weight bearing as tolerated   Pertinent Vitals/Pain 2/10; pt premedicated; repositioned in footsie roll    Mobility  Bed Mobility Bed Mobility: Supine to Sit;Sit to Supine Supine to Sit: 6: Modified independent (Device/Increase time);With rails Sit to Supine: 6: Modified independent (Device/Increase time);With rail Details for Bed Mobility Assistance: requires hand rails and increased time due to pain  Transfers Transfers: Sit to Stand;Stand to Sit Sit to Stand: 5: Supervision;From bed;With upper extremity assist Stand to Sit: 5: Supervision;With upper extremity assist;To bed Details for Transfer Assistance: verbal cues for safety and management of RW Ambulation/Gait Ambulation/Gait Assistance: 5: Supervision Ambulation Distance (Feet): 170 Feet Assistive device: Rolling walker Ambulation/Gait Assistance Details:  verbal cues for gt sequencing to increase step length on R LE and increase heel strike on L LE  Gait Pattern: Step-to pattern Gait velocity: decreased due to pain  Stairs: No Wheelchair Mobility Wheelchair Mobility: No    Exercises Total Joint Exercises Ankle Circles/Pumps: AROM;10 reps;Both Quad Sets: AROM;10 reps;Left Short Arc Quad: AROM;Left;10 reps Heel Slides: AAROM;Left;10 reps;Supine Hip ABduction/ADduction: AAROM;Left;10 reps;Supine Straight Leg Raises: AROM;Left;10 reps;Supine Long Arc Quad: AROM;Left;10 reps Goniometric ROM: knee flexion 80* AAROM, ext -3* AAROM   PT Diagnosis: Acute pain;Difficulty walking  PT Problem List: Decreased range of motion;Decreased activity tolerance;Decreased mobility;Pain PT Treatment Interventions: DME instruction;Gait training;Functional mobility training;Therapeutic activities;Therapeutic exercise;Patient/family education   PT Goals Acute Rehab PT Goals PT Goal Formulation: With patient Time For Goal Achievement: 08/03/12 Potential to Achieve Goals: Good Pt will go Sit to Stand: with modified independence PT Goal: Sit to Stand - Progress: Progressing toward goal Pt will Ambulate: >150 feet;with modified independence;with rolling walker PT Goal: Ambulate - Progress: Progressing toward goal Pt will Go Up / Down Stairs: 3-5 stairs;with supervision;with rail(s) PT Goal: Up/Down Stairs - Progress: Goal set today Pt will Perform Home Exercise Program: Independently PT Goal: Perform Home Exercise Program - Progress: Progressing toward goal  Visit Information  Last PT Received On: 07/27/12 Assistance Needed: +1    Subjective Data  Subjective: pt lying supine in CPM agreeable to therapy Patient Stated Goal: [facility]    Cognition  Cognition Arousal/Alertness: Awake/alert Behavior During Therapy: WFL for tasks assessed/performed Overall Cognitive Status: Within Functional Limits for tasks assessed    Balance  Balance Balance  Assessed: No  End of Session PT - End of Session Equipment Utilized During Treatment: Gait belt;Left knee immobilizer Activity Tolerance: Patient tolerated treatment well Patient left: in bed;with call bell/phone  within reach Nurse Communication: Mobility status CPM Left Knee CPM Left Knee: [address]   GP     Donnamarie Poag [place], [place] [phone] 07/27/2012, 3:36 PM

## 2012-07-27 NOTE — Progress Notes (Signed)
Subjective: 1 Day Post-Op Procedure(s) (LRB): TOTAL KNEE ARTHROPLASTY (Left) Patient reports pain as 4 on 0-10 scale.    Objective: Vital signs in last 24 hours: Temp:  [96.8 F (36 C)-98.6 F (37 C)] 98.3 F (36.8 C) (06/05 0200) Pulse Rate:  [57-78] 75 (06/05 0200) Resp:  [11-18] 17 (06/05 0355) BP: (114-190)/(41-93) 114/57 mmHg (06/05 0200) SpO2:  [93 %-100 %] 97 % (06/05 0355) Weight:  [83.553 kg (184 lb 3.2 oz)] 83.553 kg (184 lb 3.2 oz) (06/04 1100)  Intake/Output from previous day: 06/04 0701 - 06/05 0700 In: 1650 [I.V.:1300] Out: 1375 [Urine:1100; Drains:250; Blood:25] Intake/Output this shift:     Recent Labs  07/24/12 1420 07/26/12 1703 07/27/12 0555  HGB 14.4 12.9 10.8*    Recent Labs  07/26/12 1703 07/27/12 0555  WBC 18.2* 12.2*  RBC 4.02 3.32*  HCT 36.3 30.3*  PLT 212 204    Recent Labs  07/24/12 1420 07/26/12 1703 07/27/12 0555  NA 134*  --  129*  K 4.0  --  4.7  CL 96  --  98  CO2 28  --  22  BUN 15  --  15  CREATININE 0.95 0.80 0.73  GLUCOSE 94  --  179*  CALCIUM 9.3  --  8.1*    Recent Labs  07/24/12 1420  INR 0.99    Sensation intact distally Intact pulses distally Compartment soft hemovac removed without difficulty  Assessment/Plan: 1 Day Post-Op Procedure(s) (LRB): TOTAL KNEE ARTHROPLASTY (Left) Advance diet Up with therapy Discharge to SNF to [facility] when bed is available Will continue IV fluids to maintain sodium input  Miho Monda B 07/27/2012, 8:56 AM

## 2012-07-27 NOTE — Op Note (Signed)
Alyssa Williamson, Alyssa Williamson                 ACCOUNT NO.:  [id number]  MEDICAL RECORD NO.:  [id number]  LOCATION:  5N27C                        FACILITY:  [facility]  PHYSICIAN:  Loreta Ave, M.D. DATE OF BIRTH:  [birth year]  DATE OF PROCEDURE:  07/26/2012 DATE OF DISCHARGE:                              OPERATIVE REPORT   PREOPERATIVE DIAGNOSIS:  Left knee end-stage degenerative arthritis. Bone wear lateral compartment with a valgus alignment flexion contracture.  Underlying osteopenia.  POSTOPERATIVE DIAGNOSIS:  Left knee end-stage degenerative arthritis. Bone wear lateral compartment with a valgus alignment flexion contracture.  Underlying osteopenia.  PROCEDURE:  Modified minimally invasive left total knee replacement. Stryker triathlon prosthesis.  Soft tissue balancing including limited lateral retinacular release.  Cemented pegged posterior stabilized #4 femoral component.  Cemented #5 tibial component, 9 mm polyethylene insert.  Cemented resurfacing 35 mm patellar component.  SURGEON:  Loreta Ave, M.D.  ASSISTANT:  Julien Girt, PA-C; present throughout the entire case and necessary for timely completion of procedure.  ANESTHESIA:  General.  ESTIMATED BLOOD LOSS:  Minimal.  SPECIMENS:  None.  CULTURES:  None.  COMPLICATION:  None.  DRESSINGS:  Soft compressive knee immobilizer.  DRAINS:  Hemovac x1.  TOURNIQUET TIME:  45 minutes.  PROCEDURE IN DETAIL:  The patient was brought to the operating room, placed on the operating table in supine position.  After adequate anesthesia had been obtained, tourniquet was applied.  Prepped and draped in usual sterile fashion.  Exsanguinated with elevation of Esmarch.  Tourniquet inflated to 350 mmHg.  Knee was examined.  A 5 degree flexion contracture.  More than 10 degrees of valgus partially correctable.  Flexion 80 degrees.  Anterior approach.  Skin and subcutaneous tissue were divided.  Medial arthrotomy, vastus  splitting, preserving quad tendon.  Marked grade 4 changes throughout.  Remnants of menisci, cruciate ligaments, loose bodies removed.  Distal femur exposed.  Moderate osteopenia throughout.  Intramedullary guide placed. A 10-mm resection 5 degrees of valgus.  Marked trochlear wear and patella wear.  Using epicondylar axis, the femur was sized, cut, and fitted for a posterior stabilized pegged #4 component.  Proximal tibial resection with 0 degree cut, extramedullary guide and size #5 component. Debris cleared throughout including posterior recess.  Patella exposed. This was nearly concave for wear.  Taken down to appropriate depth, drill sized fitted for a 35 mm component.  Trials put and placed throughout.  Nicely balanced in flexion and extension.  Patella tracking laterally when I released a very thickened band in the lateral retinaculum and this was very acceptable.  Nicely balanced.  Tibia was marked for rotation and hand reamed.  All trials were removed.  Copious irrigation with a pulse irrigating device.  Cement prepared, placed on all components.  All components firmly seated.  Polyethylene attached to tibia and knee reduced.  Patella was held with a clamp.  Once cement hardened, the knee was irrigated once again.  Soft tissues injected with Exparel.  Hemovac was placed and brought out through a separate stab wound.  Arthrotomy closed with #1 Vicryl.  Skin and subcutaneous tissue with Vicryl.  Margins were injected with Marcaine.  Sterile compressive dressing was  applied.  Tourniquet deflated and removed.  Knee immobilizer applied.  Anesthesia reversed.  Brought to the recovery room.  Tolerated the surgery well.  No complications.     Loreta Ave, M.D.     DFM/MEDQ  D:  07/26/2012  T:  07/27/2012  Job:  [id number]

## 2012-07-28 ENCOUNTER — Encounter (HOSPITAL_COMMUNITY): Payer: Self-pay | Admitting: Orthopedic Surgery

## 2012-07-28 DIAGNOSIS — E871 Hypo-osmolality and hyponatremia: Secondary | ICD-10-CM

## 2012-07-28 LAB — BASIC METABOLIC PANEL
BUN: 16 mg/dL (ref 6–23)
CO2: 24 mEq/L (ref 19–32)
Chloride: 102 mEq/L (ref 96–112)
GFR calc Af Amer: >90 mL/min (ref >90)
Glucose, Bld: 159 mg/dL — ABNORMAL HIGH (ref 70–99)
Potassium: 5.3 mEq/L — ABNORMAL HIGH (ref 3.5–5.1)

## 2012-07-28 LAB — CBC
HCT: 27.1 % — ABNORMAL LOW (ref 36.0–46.0)
Hemoglobin: 9.4 g/dL — ABNORMAL LOW (ref 12.0–15.0)
RBC: 2.9 MIL/uL — ABNORMAL LOW (ref 3.87–5.11)
RDW: 13.3 % (ref 11.5–15.5)
WBC: 16.1 10*3/uL — ABNORMAL HIGH (ref 4.0–10.5)

## 2012-07-28 MED ORDER — CELECOXIB 200 MG PO CAPS: 200.0000 mg | ORAL_CAPSULE | Freq: Two times a day (BID) | ORAL | Status: DC

## 2012-07-28 MED ORDER — ENOXAPARIN SODIUM 30 MG/0.3ML ~~LOC~~ SOLN: 30.0000 mg | Freq: Two times a day (BID) | SUBCUTANEOUS | Status: DC

## 2012-07-28 MED ORDER — DSS 100 MG PO CAPS: 100.0000 mg | ORAL_CAPSULE | Freq: Two times a day (BID) | ORAL | Status: DC

## 2012-07-28 MED ORDER — OXYCODONE-ACETAMINOPHEN 5-325 MG PO TABS: 1.0000 | ORAL_TABLET | ORAL | Status: DC | PRN

## 2012-07-28 NOTE — Discharge Summary (Signed)
Physician Discharge Summary  Patient ID: Alyssa Williamson MRN: [id number] DOB/AGE: May 31, 1931 78 y.o.  Admit date: 07/26/2012 Discharge date: 07/28/2012  Admission Diagnoses:  Discharge Diagnoses:  Principal Problem:   Left knee DJD Active Problems:   Hypothyroidism   HTN (hypertension)   Hyperlipidemia   Recurrent UTI   Murmur   Arthritis of knee   Intertrigo   Diverticulitis   Arthritis   Hypothyroid   Heart murmur   Hypertension   PONV (postoperative nausea and vomiting)   Anxiety   Anemia   Hyponatremia   Discharged Condition: good  Hospital Course: Left total knee on date of admission. Uneventful hospital course   Consults: None  Significant Diagnostic Studies: microbiology: usual post op monitering labsand radiology: post op knee xrays Usual post op monitering labs; post op xrays  Treatments: antibiotics: Ancef, analgesia:oxyIR, anticoagulation: LMW heparin, therapies: PT and OT and surgery: left total knee arthroplasty  Discharge Exam: Blood pressure 112/56, pulse 60, temperature 97.2 F (36.2 C), temperature source Oral, resp. rate 18, SpO2 94.00%. Incision/Wound:clean and dry   Disposition: 01-Home or Self Care Patient to go to [facility] on Saturday June 7.    Future Appointments Provider Department Dept Phone   11/23/2012 1:15 PM Bradd Canary, MD [facility] at  [facility] [phone]       Medication List    TAKE these medications       ALPRAZolam 0.5 MG tablet  Commonly known as:  XANAX  Take 0.5 mg by mouth at bedtime.     atorvastatin 40 MG tablet  Commonly known as:  LIPITOR  Take 40 mg by mouth daily.     celecoxib 200 MG capsule  Commonly known as:  CELEBREX  Take 1 capsule (200 mg total) by mouth every 12 (twelve) hours.     CENTRUM SILVER PO  Take 1 tablet by mouth daily.     citalopram 20 MG tablet  Commonly known as:  CELEXA  Take 20 mg by mouth daily.     DSS 100 MG Caps  Take 100 mg by mouth 2 (two) times  daily.     enoxaparin 30 MG/0.3ML injection  Commonly known as:  LOVENOX  Inject 0.3 mLs (30 mg total) into the skin every 12 (twelve) hours.     furosemide 40 MG tablet  Commonly known as:  LASIX  Take 20 mg by mouth daily.     levothyroxine 75 MCG tablet  Commonly known as:  SYNTHROID, LEVOTHROID  Take 75 mcg by mouth daily.     loratadine 10 MG tablet  Commonly known as:  CLARITIN  Take 10 mg by mouth daily.     oxyCODONE-acetaminophen 5-325 MG per tablet  Commonly known as:  ROXICET  Take 1 tablet by mouth every 4 (four) hours as needed for pain.     trimethoprim 100 MG tablet  Commonly known as:  TRIMPEX  Take 100 mg by mouth daily after supper.     verapamil 180 MG CR tablet  Commonly known as:  CALAN-SR  Take 180 mg by mouth at bedtime.           Follow-up Information   Follow up with [facility], MD. Schedule an appointment as soon as possible for a visit in 10 days.   Contact information:   [address] [place] [address] [phone]       Signed: Clarene Critchley 07/28/2012, 9:11 AM

## 2012-07-28 NOTE — Care Management Note (Signed)
CARE MANAGEMENT NOTE 07/28/2012  Patient:  Alyssa Williamson, Alyssa Williamson   Account Number:  [id number]  Date Initiated:  07/28/2012  Documentation initiated by:  Vance Peper  Subjective/Objective Assessment:   79 yr old female s/p left total knee arthroplasty.     Action/Plan:   Patient is for shortterm rehab at  [facility].Social Worker is aware.   Anticipated DC Date:  07/29/2012   Anticipated DC Plan:  HOME W HOME HEALTH SERVICES  In-house referral  Clinical Social Worker      DC Planning Services  CM consult      Choice offered to / List presented to:             Status of service:  Completed, signed off Medicare Important Message given?   (If response is "NO", the following Medicare IM given date fields will be blank) Date Medicare IM given:   Date Additional Medicare IM given:    Discharge Disposition:  HOME W HOME HEALTH SERVICES  Per UR Regulation:    If discussed at Long Length of Stay Meetings, dates discussed:    Comments:

## 2012-07-28 NOTE — Evaluation (Signed)
Occupational Therapy Evaluation Patient Details Name: Alyssa Williamson MRN: [id number] DOB: 1931/06/19 Today's Date: 07/28/2012 Time: 5784-6962 OT Time Calculation (min): 23 min  OT Assessment / Plan / Recommendation Clinical Impression  Pt is a pleasant 79 yr old female admitted for elective LTKA.  Overall presents at a min assist level for selfcare tasks and a supervision level for functional transfers.  Feel pt will benefit from short term SNF to reach modified independent level for home secondary to not having any assistance.  Feel all further OT needs and tretment can be address at [facility].      OT Assessment  All further OT needs can be met in the next venue of care    Follow Up Recommendations  SNF    Barriers to Discharge None    Equipment Recommendations  None recommended by OT          Precautions / Restrictions Precautions Precautions: Knee;Fall Restrictions Weight Bearing Restrictions: No LLE Weight Bearing: Weight bearing as tolerated   Pertinent Vitals/Pain Pt reported no pain     ADL  Eating/Feeding: Simulated;Independent Where Assessed - Eating/Feeding: Edge of bed Grooming: Simulated;Supervision/safety Where Assessed - Grooming: Unsupported standing Upper Body Bathing: Simulated;Set up Where Assessed - Upper Body Bathing: Unsupported sitting Lower Body Bathing: Simulated;Minimal assistance Where Assessed - Lower Body Bathing: Supported sit to stand Upper Body Dressing: Simulated;Set up Where Assessed - Upper Body Dressing: Unsupported sitting Lower Body Dressing: Simulated;Minimal assistance Where Assessed - Lower Body Dressing: Supported sit to stand Toilet Transfer: Research scientist (life sciences) Method: Other (comment) (ambulate with RW) Acupuncturist: Comfort height toilet Toileting - Clothing Manipulation and Hygiene: Simulated;Supervision/safety Where Assessed - Toileting Clothing Manipulation and Hygiene: Sit to stand  from 3-in-1 or toilet Tub/Shower Transfer Method: Not assessed Equipment Used: Rolling walker Transfers/Ambulation Related to ADLs: Pt overall supervision level for mobility with use of RW. ADL Comments: Pt with slight difficulty reaching her feet for donning her socks.  She was able to donn the right one with increased time but needs assistance with the left.  This should improve as knee flexibility increases.  Feel she will benefit from continued OT at SNF to reach modified independent level.      OT Problem List: Decreased strength;Impaired balance (sitting and/or standing);Decreased knowledge of use of DME or AE      Visit Information  Last OT Received On: 07/28/12 Assistance Needed: +1    Subjective Data  Subjective: I did real well at [facility] last time. Patient Stated Goal: To get back to being as independent as possible.   Prior Functioning     Home Living Lives With: Alone Available Help at Discharge: Skilled Nursing Facility Home Access: Stairs to enter Entrance Stairs-Number of Steps: 4 Entrance Stairs-Rails: Can reach both;Left;Right Home Layout: One level Home Adaptive Equipment: Walker - rolling;Straight cane;Built-in shower seat;Raised toilet seat with rails Prior Function Level of Independence: Independent Able to Take Stairs?: Yes Driving: Yes Communication Communication: No difficulties         Vision/Perception Vision - History Baseline Vision: No visual deficits Patient Visual Report: No change from baseline Vision - Assessment Eye Alignment: Within Functional Limits Vision Assessment: Vision not tested Perception Perception: Within Functional Limits Praxis Praxis: Intact   Cognition  Cognition Arousal/Alertness: Awake/alert Behavior During Therapy: WFL for tasks assessed/performed Overall Cognitive Status: Within Functional Limits for tasks assessed    Extremity/Trunk Assessment Right Upper Extremity Assessment RUE ROM/Strength/Tone:  WFL for tasks assessed RUE Sensation: WFL - Light Touch RUE  Coordination: WFL - gross/fine motor Left Upper Extremity Assessment LUE ROM/Strength/Tone: WFL for tasks assessed LUE Sensation: WFL - Light Touch LUE Coordination: WFL - gross/fine motor Trunk Assessment Trunk Assessment: Normal     Mobility Bed Mobility Bed Mobility: Supine to Sit Supine to Sit: 6: Modified independent (Device/Increase time);HOB flat Sit to Supine: 6: Modified independent (Device/Increase time) Transfers Transfers: Sit to Stand Sit to Stand: 6: Modified independent (Device/Increase time);With upper extremity assist;From bed Stand to Sit: 6: Modified independent (Device/Increase time);With upper extremity assist;To bed        Balance Balance Balance Assessed: Yes Static Standing Balance Static Standing - Balance Support: No upper extremity supported Static Standing - Level of Assistance: 5: Stand by assistance   End of Session OT - End of Session Equipment Utilized During Treatment: Gait belt Activity Tolerance: Patient tolerated treatment well Patient left: in bed;with call bell/phone within reach;with family/visitor present Nurse Communication: Mobility status     Derrall Hicks OTR/L Pager number [phone] 07/28/2012, 4:11 PM

## 2012-07-28 NOTE — Progress Notes (Signed)
Physical Therapy Treatment Patient Details Name: JANEKA LIBMAN MRN: [id number] DOB: [birth year] Today's Date: 07/28/2012 Time: 0931-1005 PT Time Calculation (min): 34 min  PT Assessment / Plan / Recommendation Comments on Treatment Session  Pt was able to perform independent SLR wit ext lag < 10 degrees and so ambulated without KI.  Pt continues to demonstrate step to pattern and woudl benefit from SNF rehab to maximize safety with mobility before returning home.    Follow Up Recommendations  SNF     Does the patient have the potential to tolerate intense rehabilitation     Barriers to Discharge        Equipment Recommendations  None recommended by PT    Recommendations for Other Services    Frequency 7X/week   Plan Discharge plan remains appropriate;Frequency remains appropriate    Precautions / Restrictions Precautions Precautions: Knee;Fall   Pertinent Vitals/Pain 2/10    Mobility  Bed Mobility Supine to Sit: HOB elevated;With rails;6: Modified independent (Device/Increase time) Sit to Supine: 6: Modified independent (Device/Increase time);With rail Transfers Transfers: Sit to Stand;Stand to Sit Sit to Stand: 6: Modified independent (Device/Increase time) Stand to Sit: 5: Supervision Details for Transfer Assistance: cues for proper hand placement and safety Ambulation/Gait Ambulation/Gait Assistance: 5: Supervision Ambulation Distance (Feet): 200 Feet Assistive device: Rolling walker Ambulation/Gait Assistance Details: cueing to keep RW on ground and for proper step sequence Gait Pattern: Step-to pattern    Exercises Total Joint Exercises Ankle Circles/Pumps: AROM;10 reps;Supine;Both Quad Sets: AROM;Left;10 reps;Supine Short Arc Quad: AROM;Strengthening;Left;Supine;10 reps Heel Slides: AROM;Left;10 reps;Supine Hip ABduction/ADduction: AROM;Left;10 reps;Supine Straight Leg Raises: AROM;Left;10 reps;Supine Goniometric ROM: AROM -5 degrees to 80 degrees knee flexion    PT Diagnosis:    PT Problem List:   PT Treatment Interventions:     PT Goals Acute Rehab PT Goals PT Goal Formulation: With patient PT Goal: Sit to Stand - Progress: Met PT Goal: Ambulate - Progress: Progressing toward goal PT Goal: Perform Home Exercise Program - Progress: Progressing toward goal  Visit Information  Last PT Received On: 07/28/12 Assistance Needed: +1    Subjective Data  Subjective: " I think I am going tomorrow morning." Patient Stated Goal: [facility]    Cognition  Cognition Arousal/Alertness: Awake/alert Behavior During Therapy: WFL for tasks assessed/performed Overall Cognitive Status: Within Functional Limits for tasks assessed    Balance     End of Session PT - End of Session Equipment Utilized During Treatment: Gait belt Activity Tolerance: Patient tolerated treatment well Patient left: in bed;with call bell/phone within reach   GP     [facility] LUBECK 07/28/2012, 10:50 AM

## 2012-07-28 NOTE — Progress Notes (Signed)
Subjective: 2 Days Post-Op Procedure(s) (LRB): TOTAL KNEE ARTHROPLASTY (Left) Patient reports pain as 3 on 0-10 scale.  Voiding without difficulty No nausea   Objective: Vital signs in last 24 hours: Temp:  [97.2 F (36.2 C)-98.6 F (37 C)] 97.2 F (36.2 C) (06/06 0551) Pulse Rate:  [60-72] 60 (06/06 0551) Resp:  [18-20] 18 (06/06 0800) BP: (112-146)/(51-70) 112/56 mmHg (06/06 0551) SpO2:  [93 %-98 %] 94 % (06/06 0800)  Intake/Output from previous day: 06/05 0701 - 06/06 0700 In: 3263.3 [I.V.:3263.3] Out: -  Intake/Output this shift:     Recent Labs  07/26/12 1703 07/27/12 0555 07/28/12 0625  HGB 12.9 10.8* 9.4*    Recent Labs  07/27/12 0555 07/28/12 0625  WBC 12.2* 16.1*  RBC 3.32* 2.90*  HCT 30.3* 27.1*  PLT 204 181    Recent Labs  07/27/12 0555 07/28/12 0625  NA 129* 131*  K 4.7 5.3*  CL 98 102  CO2 22 24  BUN 15 16  CREATININE 0.73 0.65  GLUCOSE 179* 159*  CALCIUM 8.1* 8.0*   No results found for this basename: LABPT, INR,  in the last 72 hours  Neurovascular intact Incision: dressing C/D/I and no drainage Compartment soft Hyponatremia resoving  Assessment/Plan: 2 Days Post-Op Procedure(s) (LRB): TOTAL KNEE ARTHROPLASTY (Left) Up with therapy D/C IV fluids Plan for discharge tomorrow to [facility](fl-2 signed)  Alyssa Williamson B 07/28/2012, 9:02 AM

## 2012-07-29 DIAGNOSIS — F329 Major depressive disorder, single episode, unspecified: Secondary | ICD-10-CM | POA: Diagnosis not present

## 2012-07-29 DIAGNOSIS — Z471 Aftercare following joint replacement surgery: Secondary | ICD-10-CM | POA: Diagnosis not present

## 2012-07-29 DIAGNOSIS — E039 Hypothyroidism, unspecified: Secondary | ICD-10-CM | POA: Diagnosis not present

## 2012-07-29 DIAGNOSIS — Z96659 Presence of unspecified artificial knee joint: Secondary | ICD-10-CM | POA: Diagnosis not present

## 2012-07-29 DIAGNOSIS — S7290XA Unspecified fracture of unspecified femur, initial encounter for closed fracture: Secondary | ICD-10-CM | POA: Diagnosis not present

## 2012-07-29 DIAGNOSIS — Z87440 Personal history of urinary (tract) infections: Secondary | ICD-10-CM | POA: Diagnosis not present

## 2012-07-29 DIAGNOSIS — Z5189 Encounter for other specified aftercare: Secondary | ICD-10-CM | POA: Diagnosis not present

## 2012-07-29 DIAGNOSIS — R3911 Hesitancy of micturition: Secondary | ICD-10-CM | POA: Diagnosis not present

## 2012-07-29 DIAGNOSIS — E785 Hyperlipidemia, unspecified: Secondary | ICD-10-CM | POA: Diagnosis not present

## 2012-07-29 DIAGNOSIS — D649 Anemia, unspecified: Secondary | ICD-10-CM | POA: Diagnosis not present

## 2012-07-29 DIAGNOSIS — M1990 Unspecified osteoarthritis, unspecified site: Secondary | ICD-10-CM | POA: Diagnosis not present

## 2012-07-29 DIAGNOSIS — I10 Essential (primary) hypertension: Secondary | ICD-10-CM | POA: Diagnosis not present

## 2012-07-29 DIAGNOSIS — F411 Generalized anxiety disorder: Secondary | ICD-10-CM | POA: Diagnosis not present

## 2012-07-29 DIAGNOSIS — E876 Hypokalemia: Secondary | ICD-10-CM | POA: Diagnosis not present

## 2012-07-29 LAB — CBC
HCT: 24.3 % — ABNORMAL LOW (ref 36.0–46.0)
Hemoglobin: 8.6 g/dL — ABNORMAL LOW (ref 12.0–15.0)
MCHC: 35.4 g/dL (ref 30.0–36.0)
MCV: 91.4 fL (ref 78.0–100.0)

## 2012-07-29 LAB — URINE CULTURE: Colony Count: 80000

## 2012-07-29 LAB — BASIC METABOLIC PANEL
BUN: 18 mg/dL (ref 6–23)
Chloride: 99 mEq/L (ref 96–112)
GFR calc non Af Amer: 78 mL/min — ABNORMAL LOW (ref >90)
Glucose, Bld: 110 mg/dL — ABNORMAL HIGH (ref 70–99)
Potassium: 5.1 mEq/L (ref 3.5–5.1)

## 2012-07-29 MED ORDER — ALPRAZOLAM 0.5 MG PO TABS: 0.5000 mg | ORAL_TABLET | Freq: Every day | ORAL | Status: DC

## 2012-07-29 NOTE — Progress Notes (Signed)
Physical Therapy Treatment Patient Details Name: Alyssa Williamson MRN: [id number] DOB: 02-Jun-1931 Today's Date: 07/29/2012 Time: 4098-1191 PT Time Calculation (min): 13 min  PT Assessment / Plan / Recommendation Comments on Treatment Session  Pt progress well towards goals.    Follow Up Recommendations  SNF           Equipment Recommendations  None recommended by PT       Frequency 7X/week   Plan Discharge plan remains appropriate;Frequency remains appropriate    Precautions / Restrictions Precautions Precautions: Knee;Fall Restrictions LLE Weight Bearing: Weight bearing as tolerated       Mobility  Transfers Sit to Stand: 6: Modified independent (Device/Increase time);With upper extremity assist;From chair/3-in-1;From toilet;With armrests Stand to Sit: 6: Modified independent (Device/Increase time);To chair/3-in-1;To toilet;With upper extremity assist;With armrests Details for Transfer Assistance: no cues or assistance needed Ambulation/Gait Ambulation/Gait Assistance: 5: Supervision Ambulation Distance (Feet): 220 Feet Assistive device: Rolling walker Ambulation/Gait Assistance Details: min cues for walker position with gait and to "relax" shoulders with gait Gait Pattern: Step-through pattern;Decreased stride length;Antalgic    Exercises Total Joint Exercises Quad Sets: AROM;Strengthening;Left;10 reps;Seated Heel Slides: AROM;Strengthening;Left;10 reps;Seated Straight Leg Raises: AROM;Strengthening;10 reps;Seated Long Arc Quad: AROM;Strengthening;10 reps;Left;Seated     PT Goals Acute Rehab PT Goals Pt will go Sit to Stand: with modified independence PT Goal: Sit to Stand - Progress: Met Pt will Ambulate: >150 feet;with modified independence;with rolling walker PT Goal: Ambulate - Progress: Progressing toward goal Pt will Go Up / Down Stairs: 3-5 stairs;with supervision;with rail(s) PT Goal: Up/Down Stairs - Progress: Progressing toward goal Pt will Perform  Home Exercise Program: Independently PT Goal: Perform Home Exercise Program - Progress: Progressing toward goal  Visit Information  Last PT Received On: 07/29/12 Assistance Needed: +1    Subjective Data  Subjective: No new complaints, reports she is waiting on MD/PA so she can leave today.   Cognition  Cognition Arousal/Alertness: Awake/alert Behavior During Therapy: WFL for tasks assessed/performed Overall Cognitive Status: Within Functional Limits for tasks assessed       End of Session PT - End of Session Equipment Utilized During Treatment: Gait belt Activity Tolerance: Patient tolerated treatment well Patient left: in bed;with call bell/phone within reach Nurse Communication: Mobility status   GP     Sallyanne Kuster 07/29/2012, 1:58 PM  Sallyanne Kuster, PTA Office- [phone]

## 2012-07-29 NOTE — Progress Notes (Signed)
Clinical Social Work  CSW faxed DC summary to [facility] who is agreeable to accept patient today. CSW prepared DC packet with FL2 and hard scripts included. CSW informed patient, dtr and RN who were all agreeable to DC and dtr to transport to SNF. CSW is signing off but available if needed.  Unk Lightning, LCSW (Weekend Coverage)

## 2012-07-31 DIAGNOSIS — S7290XA Unspecified fracture of unspecified femur, initial encounter for closed fracture: Secondary | ICD-10-CM | POA: Diagnosis not present

## 2012-07-31 DIAGNOSIS — D649 Anemia, unspecified: Secondary | ICD-10-CM | POA: Diagnosis not present

## 2012-07-31 DIAGNOSIS — E876 Hypokalemia: Secondary | ICD-10-CM | POA: Diagnosis not present

## 2012-07-31 DIAGNOSIS — E039 Hypothyroidism, unspecified: Secondary | ICD-10-CM | POA: Diagnosis not present

## 2012-08-15 DIAGNOSIS — R269 Unspecified abnormalities of gait and mobility: Secondary | ICD-10-CM | POA: Diagnosis not present

## 2012-08-15 DIAGNOSIS — M25669 Stiffness of unspecified knee, not elsewhere classified: Secondary | ICD-10-CM | POA: Diagnosis not present

## 2012-08-15 DIAGNOSIS — M25569 Pain in unspecified knee: Secondary | ICD-10-CM | POA: Diagnosis not present

## 2012-08-15 DIAGNOSIS — M1710 Unilateral primary osteoarthritis, unspecified knee: Secondary | ICD-10-CM | POA: Diagnosis not present

## 2012-08-18 DIAGNOSIS — M1710 Unilateral primary osteoarthritis, unspecified knee: Secondary | ICD-10-CM | POA: Diagnosis not present

## 2012-08-18 DIAGNOSIS — M25569 Pain in unspecified knee: Secondary | ICD-10-CM | POA: Diagnosis not present

## 2012-08-18 DIAGNOSIS — R269 Unspecified abnormalities of gait and mobility: Secondary | ICD-10-CM | POA: Diagnosis not present

## 2012-08-18 DIAGNOSIS — M25669 Stiffness of unspecified knee, not elsewhere classified: Secondary | ICD-10-CM | POA: Diagnosis not present

## 2012-08-21 DIAGNOSIS — R269 Unspecified abnormalities of gait and mobility: Secondary | ICD-10-CM | POA: Diagnosis not present

## 2012-08-21 DIAGNOSIS — M25569 Pain in unspecified knee: Secondary | ICD-10-CM | POA: Diagnosis not present

## 2012-08-21 DIAGNOSIS — M25669 Stiffness of unspecified knee, not elsewhere classified: Secondary | ICD-10-CM | POA: Diagnosis not present

## 2012-08-21 DIAGNOSIS — M1710 Unilateral primary osteoarthritis, unspecified knee: Secondary | ICD-10-CM | POA: Diagnosis not present

## 2012-08-23 DIAGNOSIS — R269 Unspecified abnormalities of gait and mobility: Secondary | ICD-10-CM | POA: Diagnosis not present

## 2012-08-23 DIAGNOSIS — M25669 Stiffness of unspecified knee, not elsewhere classified: Secondary | ICD-10-CM | POA: Diagnosis not present

## 2012-08-23 DIAGNOSIS — M1710 Unilateral primary osteoarthritis, unspecified knee: Secondary | ICD-10-CM | POA: Diagnosis not present

## 2012-08-23 DIAGNOSIS — M25569 Pain in unspecified knee: Secondary | ICD-10-CM | POA: Diagnosis not present

## 2012-08-28 DIAGNOSIS — M1710 Unilateral primary osteoarthritis, unspecified knee: Secondary | ICD-10-CM | POA: Diagnosis not present

## 2012-08-28 DIAGNOSIS — M25669 Stiffness of unspecified knee, not elsewhere classified: Secondary | ICD-10-CM | POA: Diagnosis not present

## 2012-08-28 DIAGNOSIS — R269 Unspecified abnormalities of gait and mobility: Secondary | ICD-10-CM | POA: Diagnosis not present

## 2012-08-28 DIAGNOSIS — M25569 Pain in unspecified knee: Secondary | ICD-10-CM | POA: Diagnosis not present

## 2012-08-30 DIAGNOSIS — R269 Unspecified abnormalities of gait and mobility: Secondary | ICD-10-CM | POA: Diagnosis not present

## 2012-08-30 DIAGNOSIS — M25669 Stiffness of unspecified knee, not elsewhere classified: Secondary | ICD-10-CM | POA: Diagnosis not present

## 2012-08-30 DIAGNOSIS — M25569 Pain in unspecified knee: Secondary | ICD-10-CM | POA: Diagnosis not present

## 2012-08-30 DIAGNOSIS — M1710 Unilateral primary osteoarthritis, unspecified knee: Secondary | ICD-10-CM | POA: Diagnosis not present

## 2012-08-31 DIAGNOSIS — N390 Urinary tract infection, site not specified: Secondary | ICD-10-CM | POA: Diagnosis not present

## 2012-08-31 DIAGNOSIS — N952 Postmenopausal atrophic vaginitis: Secondary | ICD-10-CM | POA: Diagnosis not present

## 2012-09-01 ENCOUNTER — Telehealth: Payer: Self-pay | Admitting: Family Medicine

## 2012-09-01 DIAGNOSIS — M1710 Unilateral primary osteoarthritis, unspecified knee: Secondary | ICD-10-CM | POA: Diagnosis not present

## 2012-09-01 DIAGNOSIS — M25569 Pain in unspecified knee: Secondary | ICD-10-CM | POA: Diagnosis not present

## 2012-09-01 DIAGNOSIS — R269 Unspecified abnormalities of gait and mobility: Secondary | ICD-10-CM | POA: Diagnosis not present

## 2012-09-01 DIAGNOSIS — M25669 Stiffness of unspecified knee, not elsewhere classified: Secondary | ICD-10-CM | POA: Diagnosis not present

## 2012-09-01 MED ORDER — ALPRAZOLAM 0.5 MG PO TABS: 0.5000 mg | ORAL_TABLET | Freq: Every day | ORAL | Status: DC

## 2012-09-01 NOTE — Telephone Encounter (Signed)
Rx request phoned to pharmacy/SLS  

## 2012-09-01 NOTE — Telephone Encounter (Signed)
Last refill given 07/29/12 #30 x no refills. Please advise re: refill.

## 2012-09-01 NOTE — Telephone Encounter (Signed)
Yes.  OK to refill. 

## 2012-09-01 NOTE — Telephone Encounter (Signed)
Refill- alprazolam 0.5mg  tablets. Take one tablet by mouth at bedtime as needed. Qty 30 last fill 5.31.14

## 2012-09-05 ENCOUNTER — Ambulatory Visit: Payer: Medicare Other | Admitting: Family Medicine

## 2012-09-08 DIAGNOSIS — M1710 Unilateral primary osteoarthritis, unspecified knee: Secondary | ICD-10-CM | POA: Diagnosis not present

## 2012-09-08 DIAGNOSIS — M25669 Stiffness of unspecified knee, not elsewhere classified: Secondary | ICD-10-CM | POA: Diagnosis not present

## 2012-09-08 DIAGNOSIS — R269 Unspecified abnormalities of gait and mobility: Secondary | ICD-10-CM | POA: Diagnosis not present

## 2012-09-08 DIAGNOSIS — M25569 Pain in unspecified knee: Secondary | ICD-10-CM | POA: Diagnosis not present

## 2012-09-12 DIAGNOSIS — Z471 Aftercare following joint replacement surgery: Secondary | ICD-10-CM | POA: Diagnosis not present

## 2012-09-12 DIAGNOSIS — Z96659 Presence of unspecified artificial knee joint: Secondary | ICD-10-CM | POA: Diagnosis not present

## 2012-09-15 ENCOUNTER — Other Ambulatory Visit: Payer: Self-pay | Admitting: *Deleted

## 2012-09-15 MED ORDER — FUROSEMIDE 40 MG PO TABS: 20.0000 mg | ORAL_TABLET | Freq: Every day | ORAL | Status: DC

## 2012-09-15 MED ORDER — CITALOPRAM HYDROBROMIDE 20 MG PO TABS: 20.0000 mg | ORAL_TABLET | Freq: Every day | ORAL | Status: DC

## 2012-09-15 NOTE — Telephone Encounter (Signed)
Faxed refill request received from pharmacy for Citalopram Last filled by MD on 11.04.13, #30x6 Last AEX - 04.01.14 Next AEX - 6-Months Refill sent per [facility] refill protocol/SLS

## 2012-09-22 DIAGNOSIS — N390 Urinary tract infection, site not specified: Secondary | ICD-10-CM | POA: Diagnosis not present

## 2012-09-25 ENCOUNTER — Other Ambulatory Visit: Payer: Self-pay

## 2012-09-25 MED ORDER — ALPRAZOLAM 0.5 MG PO TABS: 0.5000 mg | ORAL_TABLET | Freq: Every day | ORAL | Status: DC

## 2012-09-25 NOTE — Telephone Encounter (Signed)
Patient left a message stating that she had a very bad week last week and had to take 2 tabs of Alprazolam daily instead of 1 tablet.  Pt states she needs a refill sent to pharmacy? Please advise? Last RX was done on 09-01-12 quantity 30 with 0 refills  If ok fax to [phone]

## 2012-09-25 NOTE — Telephone Encounter (Signed)
OK to refill a little early

## 2012-09-26 ENCOUNTER — Other Ambulatory Visit: Payer: Self-pay | Admitting: *Deleted

## 2012-09-26 MED ORDER — ATORVASTATIN CALCIUM 40 MG PO TABS: 40.0000 mg | ORAL_TABLET | Freq: Every day | ORAL | Status: DC

## 2012-09-26 MED ORDER — ALPRAZOLAM 0.5 MG PO TABS: 0.5000 mg | ORAL_TABLET | Freq: Every day | ORAL | Status: DC

## 2012-09-26 NOTE — Addendum Note (Signed)
Addended by: Court Joy on: 09/26/2012 09:22 AM   Modules accepted: Orders

## 2012-09-26 NOTE — Telephone Encounter (Signed)
Rx request to pharmacy/SLS  

## 2012-10-05 DIAGNOSIS — N390 Urinary tract infection, site not specified: Secondary | ICD-10-CM | POA: Diagnosis not present

## 2012-10-25 ENCOUNTER — Other Ambulatory Visit: Payer: Self-pay | Admitting: Family Medicine

## 2012-10-25 NOTE — Telephone Encounter (Signed)
Please advise? Last RX was done on 09-26-12 quantity 30 with 0 refills   If ok fax to [phone]

## 2012-10-26 NOTE — Telephone Encounter (Signed)
RX faxed

## 2012-11-18 DIAGNOSIS — Z23 Encounter for immunization: Secondary | ICD-10-CM | POA: Diagnosis not present

## 2012-11-20 ENCOUNTER — Other Ambulatory Visit: Payer: Self-pay | Admitting: Family Medicine

## 2012-11-20 NOTE — Telephone Encounter (Signed)
eScribe request for refill on Alprazolam Last filled - 09.03.14, #30x0 Last AEX - 04.01.14 Next AEX - 25-months, Appt scheduled for 10.02.14 Please Advise/SLS

## 2012-11-23 ENCOUNTER — Ambulatory Visit (INDEPENDENT_AMBULATORY_CARE_PROVIDER_SITE_OTHER): Payer: Medicare Other | Admitting: Family Medicine

## 2012-11-23 ENCOUNTER — Encounter: Payer: Self-pay | Admitting: Family Medicine

## 2012-11-23 VITALS — BP 118/70 | HR 63 | Temp 97.8°F | Ht 62.0 in | Wt 187.1 lb

## 2012-11-23 DIAGNOSIS — I10 Essential (primary) hypertension: Secondary | ICD-10-CM | POA: Diagnosis not present

## 2012-11-23 DIAGNOSIS — G4700 Insomnia, unspecified: Secondary | ICD-10-CM

## 2012-11-23 DIAGNOSIS — D649 Anemia, unspecified: Secondary | ICD-10-CM

## 2012-11-23 DIAGNOSIS — E039 Hypothyroidism, unspecified: Secondary | ICD-10-CM

## 2012-11-23 DIAGNOSIS — R739 Hyperglycemia, unspecified: Secondary | ICD-10-CM

## 2012-11-23 DIAGNOSIS — E785 Hyperlipidemia, unspecified: Secondary | ICD-10-CM

## 2012-11-23 DIAGNOSIS — R7309 Other abnormal glucose: Secondary | ICD-10-CM | POA: Diagnosis not present

## 2012-11-23 DIAGNOSIS — Z23 Encounter for immunization: Secondary | ICD-10-CM | POA: Diagnosis not present

## 2012-11-23 DIAGNOSIS — M1710 Unilateral primary osteoarthritis, unspecified knee: Secondary | ICD-10-CM

## 2012-11-23 DIAGNOSIS — N390 Urinary tract infection, site not specified: Secondary | ICD-10-CM

## 2012-11-23 DIAGNOSIS — E871 Hypo-osmolality and hyponatremia: Secondary | ICD-10-CM

## 2012-11-23 LAB — HEPATIC FUNCTION PANEL
AST: 17 U/L (ref 0–37)
Albumin: 3.7 g/dL (ref 3.5–5.2)
Alkaline Phosphatase: 72 U/L (ref 39–117)
Total Bilirubin: 0.6 mg/dL (ref 0.3–1.2)
Total Protein: 6.3 g/dL (ref 6.0–8.3)

## 2012-11-23 LAB — LIPID PANEL
HDL: 59 mg/dL (ref >39)
LDL Cholesterol: 68 mg/dL (ref 0–99)
Total CHOL/HDL Ratio: 2.5 Ratio
Triglycerides: 97 mg/dL (ref <150)
VLDL: 19 mg/dL (ref 0–40)

## 2012-11-23 LAB — CBC
HCT: 35.5 % — ABNORMAL LOW (ref 36.0–46.0)
Hemoglobin: 12.2 g/dL (ref 12.0–15.0)
MCH: 30.8 pg (ref 26.0–34.0)
MCV: 89.6 fL (ref 78.0–100.0)
RBC: 3.96 MIL/uL (ref 3.87–5.11)
RDW: 14.6 % (ref 11.5–15.5)
WBC: 6.5 10*3/uL (ref 4.0–10.5)

## 2012-11-23 LAB — RENAL FUNCTION PANEL
Albumin: 3.7 g/dL (ref 3.5–5.2)
BUN: 14 mg/dL (ref 6–23)
CO2: 30 mEq/L (ref 19–32)
Glucose, Bld: 113 mg/dL — ABNORMAL HIGH (ref 70–99)
Phosphorus: 4 mg/dL (ref 2.3–4.6)
Sodium: 137 mEq/L (ref 135–145)

## 2012-11-23 MED ORDER — ALPRAZOLAM 0.25 MG PO TABS: 0.2500 mg | ORAL_TABLET | Freq: Two times a day (BID) | ORAL | Status: DC | PRN

## 2012-11-23 NOTE — Patient Instructions (Addendum)
Probiotics such as Digestive Advantage daily Try Melatonin 5-10 mg at bed Try dropping the Alprazolam to the 0.25 mg tabs 1 1/2 at bed for 3-5 days then drop to 1 at bed for a week then 1/2 at bed, then every other day then stop    Insomnia Insomnia is frequent trouble falling and/or staying asleep. Insomnia can be a long term problem or a short term problem. Both are common. Insomnia can be a short term problem when the wakefulness is related to a certain stress or worry. Long term insomnia is often related to ongoing stress during waking hours and/or poor sleeping habits. Overtime, sleep deprivation itself can make the problem worse. Every little thing feels more severe because you are overtired and your ability to cope is decreased. CAUSES   Stress, anxiety, and depression.  Poor sleeping habits.  Distractions such as TV in the bedroom.  Naps close to bedtime.  Engaging in emotionally charged conversations before bed.  Technical reading before sleep.  Alcohol and other sedatives. They may make the problem worse. They can hurt normal sleep patterns and normal dream activity.  Stimulants such as caffeine for several hours prior to bedtime.  Pain syndromes and shortness of breath can cause insomnia.  Exercise late at night.  Changing time zones may cause sleeping problems (jet lag). It is sometimes helpful to have someone observe your sleeping patterns. They should look for periods of not breathing during the night (sleep apnea). They should also look to see how long those periods last. If you live alone or observers are uncertain, you can also be observed at a sleep clinic where your sleep patterns will be professionally monitored. Sleep apnea requires a checkup and treatment. Give your caregivers your medical history. Give your caregivers observations your family has made about your sleep.  SYMPTOMS   Not feeling rested in the morning.  Anxiety and restlessness at  bedtime.  Difficulty falling and staying asleep. TREATMENT   Your caregiver may prescribe treatment for an underlying medical disorders. Your caregiver can give advice or help if you are using alcohol or other drugs for self-medication. Treatment of underlying problems will usually eliminate insomnia problems.  Medications can be prescribed for short time use. They are generally not recommended for lengthy use.  Over-the-counter sleep medicines are not recommended for lengthy use. They can be habit forming.  You can promote easier sleeping by making lifestyle changes such as:  Using relaxation techniques that help with breathing and reduce muscle tension.  Exercising earlier in the day.  Changing your diet and the time of your last meal. No night time snacks.  Establish a regular time to go to bed.  Counseling can help with stressful problems and worry.  Soothing music and white noise may be helpful if there are background noises you cannot remove.  Stop tedious detailed work at least one hour before bedtime. HOME CARE INSTRUCTIONS   Keep a diary. Inform your caregiver about your progress. This includes any medication side effects. See your caregiver regularly. Take note of:  Times when you are asleep.  Times when you are awake during the night.  The quality of your sleep.  How you feel the next day. This information will help your caregiver care for you.  Get out of bed if you are still awake after 15 minutes. Read or do some quiet activity. Keep the lights down. Wait until you feel sleepy and go back to bed.  Keep regular sleeping and waking  hours. Avoid naps.  Exercise regularly.  Avoid distractions at bedtime. Distractions include watching television or engaging in any intense or detailed activity like attempting to balance the household checkbook.  Develop a bedtime ritual. Keep a familiar routine of bathing, brushing your teeth, climbing into bed at the same time  each night, listening to soothing music. Routines increase the success of falling to sleep faster.  Use relaxation techniques. This can be using breathing and muscle tension release routines. It can also include visualizing peaceful scenes. You can also help control troubling or intruding thoughts by keeping your mind occupied with boring or repetitive thoughts like the old concept of counting sheep. You can make it more creative like imagining planting one beautiful flower after another in your backyard garden.  During your day, work to eliminate stress. When this is not possible use some of the previous suggestions to help reduce the anxiety that accompanies stressful situations. MAKE SURE YOU:   Understand these instructions.  Will watch your condition.  Will get help right away if you are not doing well or get worse. Document Released: 02/06/2000 Document Revised: 05/03/2011 Document Reviewed: 03/08/2007 [facility] Patient Information 2014 [place], [place].

## 2012-11-24 ENCOUNTER — Encounter: Payer: Self-pay | Admitting: Family Medicine

## 2012-11-24 LAB — TSH: TSH: 2.047 u[IU]/mL (ref 0.350–4.500)

## 2012-11-24 LAB — HEMOGLOBIN A1C: Mean Plasma Glucose: 126 mg/dL — ABNORMAL HIGH (ref <117)

## 2012-11-24 NOTE — Assessment & Plan Note (Signed)
Well treated on current dose of Levothyroxine 

## 2012-11-24 NOTE — Assessment & Plan Note (Signed)
Nearly resolved today, significant anemia noted after surgery. No changes today

## 2012-11-24 NOTE — Assessment & Plan Note (Signed)
Resolved today. 

## 2012-11-24 NOTE — Progress Notes (Signed)
Patient ID: Alyssa Williamson, female   DOB: 1931-08-20, 79 y.o.   MRN: [id number] [name] [id number] 1931-10-21 11/24/2012      Progress Note-Follow Up  Subjective  Chief Complaint  Chief Complaint  Patient presents with  . Follow-up    6 month    HPI  Patient is a 79 year old Caucasian female who is in today for followup. She's recently undergone a left total knee replacement and is doing well. Reports a great improvement in her pain and mobility. Tolerated physical therapy. Denies any recent illness. Her urinary symptoms have been well-controlled on low-dose trimethoprim, daily probiotics and cranberry tabs. She continues to follow with urology. No recent fevers. Denies chest pain, palpitations, abdominal pain, shortness of breath, GI or GU concerns at this time. Is taking medications as prescribed  Past Medical History  Diagnosis Date  . Diverticulitis   . Arthritis     bilateral knee  . Hypothyroid   . Allergic rhinitis   . Heart murmur   . Hyperlipidemia   . Hypertension   . PONV (postoperative nausea and vomiting)   . Anxiety   . Anemia   . Left knee DJD     Past Surgical History  Procedure Laterality Date  . Replacement total knee  10/2010    right knee-[facility]   . Bladder tack  04/2010    Dr Marciano Sequin  . Abdominal hysterectomy    . Tonsillectomy    . Joint replacement Right   . Ganglion cyst excision    . Total knee arthroplasty Left 07/26/2012    Procedure: TOTAL KNEE ARTHROPLASTY;  Surgeon: Loreta Ave, MD;  [facility];  Service: Orthopedics;  Laterality: Left;    Family History  Problem Relation Age of Onset  . Alcohol abuse Father   . Breast cancer Sister   . Breast cancer Daughter     1 17f 3  . Brain cancer Daughter     1 of 3  . Heart failure Mother   . Prostate cancer Brother     2 of 5  . Colon cancer Neg Hx   . Hypertension Neg Hx   . Diabetes Brother     1 of 5    History   Social History  . Marital Status: Widowed   Spouse Name: N/A    Number of Children: 3  . Years of Education: N/A   Occupational History  . Not on file.   Social History Main Topics  . Smoking status: Never Smoker   . Smokeless tobacco: Never Used  . Alcohol Use: Yes     Comment: rare  . Drug Use: No  . Sexual Activity: Not on file   Other Topics Concern  . Not on file   Social History Narrative  . No narrative on file    Current Outpatient Prescriptions on File Prior to Visit  Medication Sig Dispense Refill  . atorvastatin (LIPITOR) 40 MG tablet Take 1 tablet (40 mg total) by mouth daily.  30 tablet  3  . celecoxib (CELEBREX) 200 MG capsule Take 1 capsule (200 mg total) by mouth every 12 (twelve) hours.  30 capsule  1  . citalopram (CELEXA) 20 MG tablet Take 1 tablet (20 mg total) by mouth daily.  30 tablet  3  . docusate sodium 100 MG CAPS Take 100 mg by mouth 2 (two) times daily.  10 capsule  0  . enoxaparin (LOVENOX) 30 MG/0.3ML injection Inject 0.3 mLs (30 mg  total) into the skin every 12 (twelve) hours.  16 Syringe  0  . furosemide (LASIX) 40 MG tablet Take 0.5 tablets (20 mg total) by mouth daily.  30 tablet  3  . levothyroxine (SYNTHROID, LEVOTHROID) 75 MCG tablet Take 75 mcg by mouth daily.      Marland Kitchen loratadine (CLARITIN) 10 MG tablet Take 10 mg by mouth daily.      . Multiple Vitamins-Minerals (CENTRUM SILVER PO) Take 1 tablet by mouth daily.      Marland Kitchen trimethoprim (TRIMPEX) 100 MG tablet Take 100 mg by mouth daily after supper.      . verapamil (CALAN-SR) 180 MG CR tablet Take 180 mg by mouth at bedtime.       No current facility-administered medications on file prior to visit.    Allergies  Allergen Reactions  . Codeine Nausea And Vomiting    Review of Systems  Review of Systems  Constitutional: Negative for fever and malaise/fatigue.  HENT: Negative for congestion.   Eyes: Negative for discharge.  Respiratory: Negative for shortness of breath.   Cardiovascular: Negative for chest pain, palpitations and  leg swelling.  Gastrointestinal: Negative for nausea, abdominal pain and diarrhea.  Genitourinary: Negative for dysuria.  Musculoskeletal: Positive for joint pain. Negative for falls.  Skin: Negative for rash.  Neurological: Negative for loss of consciousness and headaches.  Endo/Heme/Allergies: Negative for polydipsia.  Psychiatric/Behavioral: Negative for depression and suicidal ideas. The patient is not nervous/anxious and does not have insomnia.     Objective  BP 118/70  Pulse 63  Temp(Src) 97.8 F (36.6 C) (Oral)  Ht 5\' 2"  (1.575 m)  Wt 187 lb 1.9 oz (84.877 kg)  BMI 34.22 kg/m2  SpO2 92%  Physical Exam  Physical Exam  Constitutional: She is oriented to person, place, and time and well-developed, well-nourished, and in no distress. No distress.  HENT:  Head: Normocephalic and atraumatic.  Eyes: Conjunctivae are normal.  Neck: Neck supple. No thyromegaly present.  Cardiovascular: Normal rate and regular rhythm.   Murmur heard. Pulmonary/Chest: Effort normal and breath sounds normal. She has no wheezes.  Abdominal: She exhibits no distension and no mass.  Musculoskeletal: She exhibits no edema.  Lymphadenopathy:    She has no cervical adenopathy.  Neurological: She is alert and oriented to person, place, and time.  Skin: Skin is warm and dry. No rash noted. She is not diaphoretic.  Psychiatric: Memory, affect and judgment normal.    Lab Results  Component Value Date   TSH 2.047 11/23/2012   Lab Results  Component Value Date   WBC 6.5 11/23/2012   HGB 12.2 11/23/2012   HCT 35.5* 11/23/2012   MCV 89.6 11/23/2012   PLT 221 11/23/2012   Lab Results  Component Value Date   CREATININE 0.94 11/23/2012   BUN 14 11/23/2012   NA 137 11/23/2012   K 4.8 11/23/2012   CL 103 11/23/2012   CO2 30 11/23/2012   Lab Results  Component Value Date   ALT 12 11/23/2012   AST 17 11/23/2012   ALKPHOS 72 11/23/2012   BILITOT 0.6 11/23/2012   Lab Results  Component Value Date   CHOL  146 11/23/2012   Lab Results  Component Value Date   HDL 59 11/23/2012   Lab Results  Component Value Date   LDLCALC 68 11/23/2012   Lab Results  Component Value Date   TRIG 97 11/23/2012   Lab Results  Component Value Date   CHOLHDL 2.5 11/23/2012  Assessment & Plan  HTN (hypertension) Well controlled, no changes today  Arthritis of knee Tolerated TKR recently with dr Eulah Pont, after a short course of Rehab/PT has done well  Recurrent UTI No recent infection. With daily Trimpex, probiotic and cranberry tab is doing well  Anemia Nearly resolved today, significant anemia noted after surgery. No changes today  Hyponatremia Resolved today  Hyperlipidemia Tolerating Atorvastatin, avoid trans fats, good response  Hypothyroidism Well treated on current dose of Levothyroxine

## 2012-11-24 NOTE — Assessment & Plan Note (Signed)
Well controlled, no changes today 

## 2012-11-24 NOTE — Assessment & Plan Note (Signed)
No recent infection. With daily Trimpex, probiotic and cranberry tab is doing well

## 2012-11-24 NOTE — Assessment & Plan Note (Signed)
Tolerated TKR recently with dr Eulah Pont, after a short course of Rehab/PT has done well

## 2012-11-24 NOTE — Assessment & Plan Note (Signed)
Tolerating Atorvastatin, avoid trans fats, good response

## 2012-11-30 DIAGNOSIS — N390 Urinary tract infection, site not specified: Secondary | ICD-10-CM | POA: Diagnosis not present

## 2012-12-08 ENCOUNTER — Other Ambulatory Visit: Payer: Self-pay | Admitting: Family Medicine

## 2012-12-08 NOTE — Telephone Encounter (Signed)
Rx request to pharmacy/SLS  

## 2012-12-22 ENCOUNTER — Other Ambulatory Visit: Payer: Self-pay | Admitting: Internal Medicine

## 2012-12-22 NOTE — Telephone Encounter (Signed)
Rx request to pharmacy/SLS  

## 2012-12-30 ENCOUNTER — Other Ambulatory Visit: Payer: Self-pay | Admitting: Family Medicine

## 2013-01-01 NOTE — Telephone Encounter (Signed)
DUPLICATE REQUEST

## 2013-01-01 NOTE — Telephone Encounter (Signed)
Rx request to pharmacy/SLS  

## 2013-01-04 ENCOUNTER — Other Ambulatory Visit: Payer: Self-pay | Admitting: Family Medicine

## 2013-01-04 NOTE — Telephone Encounter (Signed)
Rx request to pharmacy/SLS  

## 2013-01-24 ENCOUNTER — Other Ambulatory Visit: Payer: Self-pay | Admitting: Family Medicine

## 2013-01-28 ENCOUNTER — Other Ambulatory Visit: Payer: Self-pay | Admitting: Family Medicine

## 2013-01-28 DIAGNOSIS — F419 Anxiety disorder, unspecified: Secondary | ICD-10-CM

## 2013-01-28 NOTE — Telephone Encounter (Signed)
Please advise refill? Last RX was done on 11-23-12 quantity 35 with 1 refill  If ok fax to [phone]

## 2013-01-29 NOTE — Telephone Encounter (Signed)
RX faxed

## 2013-01-29 NOTE — Telephone Encounter (Signed)
Refill given.  Quant 35 with no refills.  Additional refills to come from PCP.  Order pending above.

## 2013-02-01 DIAGNOSIS — J019 Acute sinusitis, unspecified: Secondary | ICD-10-CM | POA: Diagnosis not present

## 2013-02-05 DIAGNOSIS — J209 Acute bronchitis, unspecified: Secondary | ICD-10-CM | POA: Diagnosis not present

## 2013-02-07 ENCOUNTER — Other Ambulatory Visit: Payer: Self-pay | Admitting: Family Medicine

## 2013-02-20 ENCOUNTER — Other Ambulatory Visit: Payer: Self-pay | Admitting: Family Medicine

## 2013-02-23 ENCOUNTER — Other Ambulatory Visit: Payer: Self-pay | Admitting: Physician Assistant

## 2013-02-26 ENCOUNTER — Other Ambulatory Visit: Payer: Self-pay | Admitting: Family Medicine

## 2013-02-27 ENCOUNTER — Telehealth: Payer: Self-pay | Admitting: Family Medicine

## 2013-02-27 MED ORDER — ALPRAZOLAM 0.25 MG PO TABS: 0.2500 mg | ORAL_TABLET | Freq: Two times a day (BID) | ORAL | Status: DC | PRN

## 2013-02-27 NOTE — Telephone Encounter (Signed)
[facility]olam faxed to them was not signed.  Please have it signed and refax or send electronic

## 2013-02-27 NOTE — Telephone Encounter (Signed)
RX reprinted and refaxed

## 2013-03-07 ENCOUNTER — Other Ambulatory Visit: Payer: Self-pay | Admitting: Family Medicine

## 2013-03-23 ENCOUNTER — Other Ambulatory Visit: Payer: Self-pay | Admitting: Family Medicine

## 2013-03-27 DIAGNOSIS — H2510 Age-related nuclear cataract, unspecified eye: Secondary | ICD-10-CM | POA: Diagnosis not present

## 2013-03-27 DIAGNOSIS — H25019 Cortical age-related cataract, unspecified eye: Secondary | ICD-10-CM | POA: Diagnosis not present

## 2013-03-28 ENCOUNTER — Other Ambulatory Visit: Payer: Self-pay | Admitting: Family Medicine

## 2013-03-28 NOTE — Telephone Encounter (Signed)
Please advise refill? Last RX was done on 02-27-13 quantity 35 with 0 refills  If ok fax to [phone]

## 2013-03-28 NOTE — Telephone Encounter (Signed)
OK to send 35 tabs zero refills.

## 2013-03-28 NOTE — Telephone Encounter (Signed)
RX faxed

## 2013-04-11 ENCOUNTER — Other Ambulatory Visit: Payer: Self-pay | Admitting: Family Medicine

## 2013-04-23 ENCOUNTER — Other Ambulatory Visit: Payer: Self-pay | Admitting: Family

## 2013-04-24 NOTE — Telephone Encounter (Signed)
Rx last printed 03/28/13. Pt last seen in October and has no future appts on file.  Rx printed and forwarded to PRovider for signature.  When should pt follow up in the office?   Medication name:  Name from pharmacy:  ALPRAZolam (XANAX) 0.25 MG tablet  ALPRAZOLAM 0.25MG  TABLETS Sig: TAKE ONE TABLET BY MOUTH TWICE DAILY AS NEEDED FOR SLEEP OR ANXIETY Dispense: 35 tablet Refills: 0 Start: 04/23/2013 Class: Normal Requested on: 04/23/2013 Originally ordered on: 11/23/2012 Last refill: 03/28/2013

## 2013-04-24 NOTE — Telephone Encounter (Signed)
Phone keeps ringing a "fast busy signal".  °

## 2013-04-24 NOTE — Telephone Encounter (Signed)
Rx called to pharmacy voicemail for 1 month supply. Please call pt to arrange appointment before further refills are due.

## 2013-04-24 NOTE — Telephone Encounter (Signed)
Needs an appt by next month to stay on this med but can have the one refill for now

## 2013-04-25 ENCOUNTER — Other Ambulatory Visit: Payer: Self-pay | Admitting: Family Medicine

## 2013-04-26 NOTE — Telephone Encounter (Signed)
Refill request for citalopram Last filled by MD on - 03/23/2013 #30 x0 Last Appt: 11/23/2012 Next Appt: none  Pt's next appt due in April. Please advise refill?

## 2013-04-26 NOTE — Telephone Encounter (Signed)
Phone keeps ringing a "fast busy signal".  °

## 2013-04-30 NOTE — Telephone Encounter (Signed)
Phone keeps ringing "busy signal" °

## 2013-05-01 NOTE — Telephone Encounter (Signed)
Letter mailed to pt to call and arrange appt before next refill is due.

## 2013-05-14 ENCOUNTER — Other Ambulatory Visit: Payer: Self-pay | Admitting: Family Medicine

## 2013-05-22 ENCOUNTER — Ambulatory Visit (INDEPENDENT_AMBULATORY_CARE_PROVIDER_SITE_OTHER): Payer: Medicare Other | Admitting: Family Medicine

## 2013-05-22 ENCOUNTER — Encounter: Payer: Self-pay | Admitting: Family Medicine

## 2013-05-22 VITALS — BP 134/64 | HR 58 | Temp 97.6°F | Ht 62.0 in | Wt 185.0 lb

## 2013-05-22 DIAGNOSIS — E039 Hypothyroidism, unspecified: Secondary | ICD-10-CM

## 2013-05-22 DIAGNOSIS — R739 Hyperglycemia, unspecified: Secondary | ICD-10-CM

## 2013-05-22 DIAGNOSIS — N390 Urinary tract infection, site not specified: Secondary | ICD-10-CM

## 2013-05-22 DIAGNOSIS — G4700 Insomnia, unspecified: Secondary | ICD-10-CM

## 2013-05-22 DIAGNOSIS — E785 Hyperlipidemia, unspecified: Secondary | ICD-10-CM

## 2013-05-22 DIAGNOSIS — R7309 Other abnormal glucose: Secondary | ICD-10-CM | POA: Diagnosis not present

## 2013-05-22 DIAGNOSIS — I10 Essential (primary) hypertension: Secondary | ICD-10-CM

## 2013-05-22 LAB — RENAL FUNCTION PANEL
Albumin: 4 g/dL (ref 3.5–5.2)
BUN: 17 mg/dL (ref 6–23)
CO2: 28 meq/L (ref 19–32)
Calcium: 9 mg/dL (ref 8.4–10.5)
Chloride: 102 mEq/L (ref 96–112)
Creat: 1.05 mg/dL (ref 0.50–1.10)
GLUCOSE: 80 mg/dL (ref 70–99)
POTASSIUM: 4.6 meq/L (ref 3.5–5.3)
Phosphorus: 4 mg/dL (ref 2.3–4.6)
Sodium: 137 mEq/L (ref 135–145)

## 2013-05-22 LAB — LIPID PANEL
CHOL/HDL RATIO: 2.3 ratio
CHOLESTEROL: 144 mg/dL (ref 0–200)
HDL: 62 mg/dL (ref >39)
LDL CALC: 65 mg/dL (ref 0–99)
Triglycerides: 84 mg/dL (ref <150)
VLDL: 17 mg/dL (ref 0–40)

## 2013-05-22 LAB — HEPATIC FUNCTION PANEL
ALK PHOS: 65 U/L (ref 39–117)
ALT: 14 U/L (ref 0–35)
AST: 21 U/L (ref 0–37)
Albumin: 4 g/dL (ref 3.5–5.2)
BILIRUBIN DIRECT: 0.1 mg/dL (ref 0.0–0.3)
BILIRUBIN INDIRECT: 0.5 mg/dL (ref 0.2–1.2)
BILIRUBIN TOTAL: 0.6 mg/dL (ref 0.2–1.2)
Total Protein: 6.4 g/dL (ref 6.0–8.3)

## 2013-05-22 LAB — CBC
HCT: 36.6 % (ref 36.0–46.0)
HEMOGLOBIN: 13.2 g/dL (ref 12.0–15.0)
MCH: 32.6 pg (ref 26.0–34.0)
MCHC: 36.1 g/dL — ABNORMAL HIGH (ref 30.0–36.0)
MCV: 90.4 fL (ref 78.0–100.0)
PLATELETS: 224 10*3/uL (ref 150–400)
RBC: 4.05 MIL/uL (ref 3.87–5.11)
RDW: 13.2 % (ref 11.5–15.5)
WBC: 6.9 10*3/uL (ref 4.0–10.5)

## 2013-05-22 LAB — HEMOGLOBIN A1C
Hgb A1c MFr Bld: 5.7 % — ABNORMAL HIGH (ref <5.7)
Mean Plasma Glucose: 117 mg/dL — ABNORMAL HIGH (ref <117)

## 2013-05-22 LAB — TSH: TSH: 1.127 u[IU]/mL (ref 0.350–4.500)

## 2013-05-22 MED ORDER — ALPRAZOLAM 0.25 MG PO TABS: 0.2500 mg | ORAL_TABLET | Freq: Two times a day (BID) | ORAL | Status: DC | PRN

## 2013-05-22 NOTE — Patient Instructions (Signed)
Probiotics daily such as Digestive Advantage, Align, generic   Arthritis, Nonspecific Arthritis is inflammation of a joint. This usually means pain, redness, warmth or swelling are present. One or more joints may be involved. There are a number of types of arthritis. Your caregiver may not be able to tell what type of arthritis you have right away. CAUSES  The most common cause of arthritis is the wear and tear on the joint (osteoarthritis). This causes damage to the cartilage, which can break down over time. The knees, hips, back and neck are most often affected by this type of arthritis. Other types of arthritis and common causes of joint pain include:  Sprains and other injuries near the joint. Sometimes minor sprains and injuries cause pain and swelling that develop hours later.  Rheumatoid arthritis. This affects hands, feet and knees. It usually affects both sides of your body at the same time. It is often associated with chronic ailments, fever, weight loss and general weakness.  Crystal arthritis. Gout and pseudo gout can cause occasional acute severe pain, redness and swelling in the foot, ankle, or knee.  Infectious arthritis. Bacteria can get into a joint through a break in overlying skin. This can cause infection of the joint. Bacteria and viruses can also spread through the blood and affect your joints.  Drug, infectious and allergy reactions. Sometimes joints can become mildly painful and slightly swollen with these types of illnesses. SYMPTOMS   Pain is the main symptom.  Your joint or joints can also be red, swollen and warm or hot to the touch.  You may have a fever with certain types of arthritis, or even feel overall ill.  The joint with arthritis will hurt with movement. Stiffness is present with some types of arthritis. DIAGNOSIS  Your caregiver will suspect arthritis based on your description of your symptoms and on your exam. Testing may be needed to find the type  of arthritis:  Blood and sometimes urine tests.  X-ray tests and sometimes CT or MRI scans.  Removal of fluid from the joint (arthrocentesis) is done to check for bacteria, crystals or other causes. Your caregiver (or a specialist) will numb the area over the joint with a local anesthetic, and use a needle to remove joint fluid for examination. This procedure is only minimally uncomfortable.  Even with these tests, your caregiver may not be able to tell what kind of arthritis you have. Consultation with a specialist (rheumatologist) may be helpful. TREATMENT  Your caregiver will discuss with you treatment specific to your type of arthritis. If the specific type cannot be determined, then the following general recommendations may apply. Treatment of severe joint pain includes:  Rest.  Elevation.  Anti-inflammatory medication (for example, ibuprofen) may be prescribed. Avoiding activities that cause increased pain.  Only take over-the-counter or prescription medicines for pain and discomfort as recommended by your caregiver.  Cold packs over an inflamed joint may be used for 10 to 15 minutes every hour. Hot packs sometimes feel better, but do not use overnight. Do not use hot packs if you are diabetic without your caregiver's permission.  A cortisone shot into arthritic joints may help reduce pain and swelling.  Any acute arthritis that gets worse over the next 1 to 2 days needs to be looked at to be sure there is no joint infection. Long-term arthritis treatment involves modifying activities and lifestyle to reduce joint stress jarring. This can include weight loss. Also, exercise is needed to nourish the  joint cartilage and remove waste. This helps keep the muscles around the joint strong. HOME CARE INSTRUCTIONS   Do not take aspirin to relieve pain if gout is suspected. This elevates uric acid levels.  Only take over-the-counter or prescription medicines for pain, discomfort or fever  as directed by your caregiver.  Rest the joint as much as possible.  If your joint is swollen, keep it elevated.  Use crutches if the painful joint is in your leg.  Drinking plenty of fluids may help for certain types of arthritis.  Follow your caregiver's dietary instructions.  Try low-impact exercise such as:  Swimming.  Water aerobics.  Biking.  Walking.  Morning stiffness is often relieved by a warm shower.  Put your joints through regular range-of-motion. SEEK MEDICAL CARE IF:   You do not feel better in 24 hours or are getting worse.  You have side effects to medications, or are not getting better with treatment. SEEK IMMEDIATE MEDICAL CARE IF:   You have a fever.  You develop severe joint pain, swelling or redness.  Many joints are involved and become painful and swollen.  There is severe back pain and/or leg weakness.  You have loss of bowel or bladder control. Document Released: 03/18/2004 Document Revised: 05/03/2011 Document Reviewed: 04/03/2008 [facility] Patient Information 2014 [place], [place].

## 2013-05-22 NOTE — Progress Notes (Signed)
Pre visit review using our clinic review tool, if applicable. No additional management support is needed unless otherwise documented below in the visit note. 

## 2013-05-23 ENCOUNTER — Encounter: Payer: Self-pay | Admitting: Family Medicine

## 2013-05-23 DIAGNOSIS — R739 Hyperglycemia, unspecified: Secondary | ICD-10-CM | POA: Insufficient documentation

## 2013-05-23 LAB — URINALYSIS
BILIRUBIN URINE: NEGATIVE
Glucose, UA: NEGATIVE mg/dL
Hgb urine dipstick: NEGATIVE
KETONES UR: NEGATIVE mg/dL
Nitrite: NEGATIVE
Protein, ur: NEGATIVE mg/dL
Urobilinogen, UA: 0.2 mg/dL (ref 0.0–1.0)
pH: 5.5 (ref 5.0–8.0)

## 2013-05-23 NOTE — Assessment & Plan Note (Signed)
Well controlled, no changes to meds. Encouraged heart healthy diet such as the DASH diet and exercise as tolerated.  °

## 2013-05-23 NOTE — Assessment & Plan Note (Signed)
Tolerating statin, encouraged heart healthy diet, avoid trans fats, minimize simple carbs and saturated fats. Increase exercise as tolerated 

## 2013-05-23 NOTE — Assessment & Plan Note (Signed)
Urinalysis negative. ?

## 2013-05-23 NOTE — Progress Notes (Signed)
Patient ID: Alyssa Williamson, female   DOB: 06/18/1931, 80 y.o.   MRN: [id number] Alyssa Williamson [id number] 06/18/1931 05/23/2013      Progress Note-Follow Up  Subjective  Chief Complaint  Chief Complaint  Patient presents with  . Follow-up    HPI  Patient is a 80 year old female in today for routine medical care. Time. Has had a couple days of some suprapubic pressure but otherwise feels well. Denies any dysuria or urinary frequency. Denies any recent illness or myalgias. Denies CP/palp/SOB/HA/congestion/fevers/GI or GU c/o. Taking meds as prescribed  Past Medical History  Diagnosis Date  . Diverticulitis   . Arthritis     bilateral knee  . Hypothyroid   . Allergic rhinitis   . Heart murmur   . Hyperlipidemia   . Hypertension   . PONV (postoperative nausea and vomiting)   . Anxiety   . Anemia   . Left knee DJD     Past Surgical History  Procedure Laterality Date  . Replacement total knee  10/2010    right knee-Murphy   . Bladder tack  04/2010    Dr Marciano Sequinimothy  [place]  . Abdominal hysterectomy    . Tonsillectomy    . Joint replacement Right   . Ganglion cyst excision    . Total knee arthroplasty Left 07/26/2012    Procedure: TOTAL KNEE ARTHROPLASTY;  Surgeon: Loreta Aveaniel F Murphy, MD;  [facility];  Service: Orthopedics;  Laterality: Left;    Family History  Problem Relation Age of Onset  . Alcohol abuse Father   . Breast cancer Sister   . Breast cancer Daughter     1 [id number] 3  . Brain cancer Daughter     1 of 3  . Heart failure Mother   . Prostate cancer Brother     2 of 5  . Colon cancer Neg Hx   . Hypertension Neg Hx   . Diabetes Brother     1 of 5    History   Social History  . Marital Status: Widowed    Spouse Name: N/A    Number of Children: 3  . Years of Education: N/A   Occupational History  . Not on file.   Social History Main Topics  . Smoking status: Never Smoker   . Smokeless tobacco: Never Used  . Alcohol Use: Yes     Comment: rare  . Drug Use:  No  . Sexual Activity: Not on file   Other Topics Concern  . Not on file   Social History Narrative  . No narrative on file    Current Outpatient Prescriptions on File Prior to Visit  Medication Sig Dispense Refill  . atorvastatin (LIPITOR) 40 MG tablet TAKE 1 TABLET BY MOUTH EVERY DAY  30 tablet  0  . celecoxib (CELEBREX) 200 MG capsule Take 1 capsule (200 mg total) by mouth every 12 (twelve) hours.  30 capsule  1  . citalopram (CELEXA) 20 MG tablet TAKE 1 TABLET BY MOUTH EVERY DAY  30 tablet  0  . docusate sodium 100 MG CAPS Take 100 mg by mouth 2 (two) times daily.  10 capsule  0  . furosemide (LASIX) 40 MG tablet Take 0.5 tablets (20 mg total) by mouth daily.  30 tablet  3  . loratadine (CLARITIN) 10 MG tablet Take 10 mg by mouth daily.      . Multiple Vitamins-Minerals (CENTRUM SILVER PO) Take 1 tablet by mouth daily.      .Marland Kitchen  trimethoprim (TRIMPEX) 100 MG tablet Take 100 mg by mouth daily after supper.      . verapamil (CALAN-SR) 180 MG CR tablet Take 1 tablet (180 mg total) by mouth at bedtime.  30 tablet  2   No current facility-administered medications on file prior to visit.    Allergies  Allergen Reactions  . Codeine Nausea And Vomiting    Review of Systems  Review of Systems  Constitutional: Negative for fever and malaise/fatigue.  HENT: Negative for congestion.   Eyes: Negative for pain and discharge.  Respiratory: Negative for shortness of breath.   Cardiovascular: Negative for chest pain, palpitations and leg swelling.  Gastrointestinal: Positive for abdominal pain. Negative for nausea and diarrhea.  Genitourinary: Negative for dysuria.  Musculoskeletal: Negative for falls.  Skin: Negative for rash.  Neurological: Negative for loss of consciousness and headaches.  Endo/Heme/Allergies: Negative for polydipsia.  Psychiatric/Behavioral: Negative for depression and suicidal ideas. The patient is not nervous/anxious and does not have insomnia.      Objective  BP 134/64  Pulse 58  Temp(Src) 97.6 F (36.4 C) (Oral)  Ht 5\' 2"  (1.575 m)  Wt 185 lb (83.915 kg)  BMI 33.83 kg/m2  SpO2 94%  Physical Exam  Physical Exam  Constitutional: She is oriented to person, place, and time and well-developed, well-nourished, and in no distress. No distress.  HENT:  Head: Normocephalic and atraumatic.  Eyes: Conjunctivae are normal.  Neck: Neck supple. No thyromegaly present.  Cardiovascular: Normal rate, regular rhythm and normal heart sounds.   Pulmonary/Chest: Effort normal and breath sounds normal. She has no wheezes.  Abdominal: She exhibits no distension and no mass.  Genitourinary: No vaginal discharge found.  Musculoskeletal: She exhibits no edema.  Lymphadenopathy:    She has no cervical adenopathy.  Neurological: She is alert and oriented to person, place, and time.  Skin: Skin is warm and dry. No rash noted. She is not diaphoretic.  Psychiatric: Memory, affect and judgment normal.    Lab Results  Component Value Date   TSH 1.127 05/22/2013   Lab Results  Component Value Date   WBC 6.9 05/22/2013   HGB 13.2 05/22/2013   HCT 36.6 05/22/2013   MCV 90.4 05/22/2013   PLT 224 05/22/2013   Lab Results  Component Value Date   CREATININE 1.05 05/22/2013   BUN 17 05/22/2013   NA 137 05/22/2013   K 4.6 05/22/2013   CL 102 05/22/2013   CO2 28 05/22/2013   Lab Results  Component Value Date   ALT 14 05/22/2013   AST 21 05/22/2013   ALKPHOS 65 05/22/2013   BILITOT 0.6 05/22/2013   Lab Results  Component Value Date   CHOL 144 05/22/2013   Lab Results  Component Value Date   HDL 62 05/22/2013   Lab Results  Component Value Date   LDLCALC 65 05/22/2013   Lab Results  Component Value Date   TRIG 84 05/22/2013   Lab Results  Component Value Date   CHOLHDL 2.3 05/22/2013     Assessment & Plan  HTN (hypertension) Well controlled, no changes to meds. Encouraged heart healthy diet such as the DASH diet and exercise as  tolerated.   Hyperlipidemia Tolerating statin, encouraged heart healthy diet, avoid trans fats, minimize simple carbs and saturated fats. Increase exercise as tolerated  Recurrent UTI Urinalysis negative  Hypothyroidism On Levothyroxine, continue to monitor  Hyperglycemia hgba1c acceptable, minimize simple carbs. Increase exercise as tolerated.

## 2013-05-23 NOTE — Assessment & Plan Note (Signed)
On Levothyroxine, continue to monitor 

## 2013-05-23 NOTE — Assessment & Plan Note (Signed)
hgba1c acceptable, minimize simple carbs. Increase exercise as tolerated.  

## 2013-05-24 ENCOUNTER — Telehealth: Payer: Self-pay | Admitting: Family Medicine

## 2013-05-24 ENCOUNTER — Other Ambulatory Visit: Payer: Self-pay | Admitting: Family Medicine

## 2013-05-24 ENCOUNTER — Telehealth: Payer: Self-pay

## 2013-05-24 DIAGNOSIS — N390 Urinary tract infection, site not specified: Secondary | ICD-10-CM

## 2013-05-24 MED ORDER — CEFDINIR 300 MG PO CAPS: 300.0000 mg | ORAL_CAPSULE | Freq: Two times a day (BID) | ORAL | Status: AC

## 2013-05-24 NOTE — Telephone Encounter (Signed)
Patient is requesting last lab results 

## 2013-05-24 NOTE — Telephone Encounter (Signed)
If you look at previous notes it shows that pt was informed yesterday.  When we get the urine culture results we will contact her like we said yesterday

## 2013-05-24 NOTE — Telephone Encounter (Signed)
Pt informed and voiced understanding

## 2013-05-24 NOTE — Telephone Encounter (Signed)
Informed patient of this.  °

## 2013-05-25 LAB — URINE CULTURE

## 2013-05-28 ENCOUNTER — Telehealth: Payer: Self-pay | Admitting: Family Medicine

## 2013-05-28 NOTE — Telephone Encounter (Signed)
Returning call to nurse °

## 2013-05-29 DIAGNOSIS — N390 Urinary tract infection, site not specified: Secondary | ICD-10-CM | POA: Diagnosis not present

## 2013-05-29 NOTE — Telephone Encounter (Signed)
Received message from Dr Madilyn HookNesi's office requesting result be faxed to [phone]. Faxed result to this #.

## 2013-05-29 NOTE — Telephone Encounter (Signed)
Received call from pt and notified of her urine culture result. She has already picked up antibiotic. She is currently at urology office and u/a today is clear. She requests that I fax urine culture result. Result faxed to Dr Brunilda PayorNesi.

## 2013-05-31 ENCOUNTER — Other Ambulatory Visit: Payer: Self-pay | Admitting: Family Medicine

## 2013-06-14 DIAGNOSIS — J019 Acute sinusitis, unspecified: Secondary | ICD-10-CM | POA: Diagnosis not present

## 2013-06-14 DIAGNOSIS — R062 Wheezing: Secondary | ICD-10-CM | POA: Diagnosis not present

## 2013-06-14 DIAGNOSIS — J309 Allergic rhinitis, unspecified: Secondary | ICD-10-CM | POA: Diagnosis not present

## 2013-06-21 ENCOUNTER — Other Ambulatory Visit: Payer: Self-pay | Admitting: Family Medicine

## 2013-07-20 ENCOUNTER — Other Ambulatory Visit: Payer: Self-pay | Admitting: Family Medicine

## 2013-07-25 ENCOUNTER — Other Ambulatory Visit: Payer: Self-pay | Admitting: Family Medicine

## 2013-07-27 ENCOUNTER — Other Ambulatory Visit: Payer: Self-pay | Admitting: Family Medicine

## 2013-08-06 ENCOUNTER — Encounter: Payer: Self-pay | Admitting: Physician Assistant

## 2013-08-06 ENCOUNTER — Ambulatory Visit (INDEPENDENT_AMBULATORY_CARE_PROVIDER_SITE_OTHER): Payer: Medicare Other | Admitting: Physician Assistant

## 2013-08-06 VITALS — BP 132/76 | HR 56 | Temp 98.3°F | Resp 16 | Ht 62.0 in | Wt 186.5 lb

## 2013-08-06 DIAGNOSIS — N3001 Acute cystitis with hematuria: Secondary | ICD-10-CM

## 2013-08-06 DIAGNOSIS — R3989 Other symptoms and signs involving the genitourinary system: Secondary | ICD-10-CM

## 2013-08-06 DIAGNOSIS — N3000 Acute cystitis without hematuria: Secondary | ICD-10-CM

## 2013-08-06 DIAGNOSIS — N390 Urinary tract infection, site not specified: Secondary | ICD-10-CM | POA: Diagnosis not present

## 2013-08-06 DIAGNOSIS — R399 Unspecified symptoms and signs involving the genitourinary system: Secondary | ICD-10-CM

## 2013-08-06 LAB — POCT URINALYSIS DIPSTICK
Bilirubin, UA: NEGATIVE
Glucose, UA: NEGATIVE
Ketones, UA: NEGATIVE
Nitrite, UA: POSITIVE
PROTEIN UA: NEGATIVE
SPEC GRAV UA: 1.01
Urobilinogen, UA: 0.2
pH, UA: 6

## 2013-08-06 MED ORDER — CIPROFLOXACIN HCL 500 MG PO TABS: 500.0000 mg | ORAL_TABLET | Freq: Two times a day (BID) | ORAL | Status: DC

## 2013-08-06 NOTE — Patient Instructions (Signed)
Increase fluids.  Continue cranberry supplement.  Restart your probiotics.  Take your Ciprofloxacin as directed.  I will call you with your urine culture results.  We will change your medication if indicated.   IF any of your symptoms worsen, please call or return to clinic.  Urinary Tract Infection Urinary tract infections (UTIs) can develop anywhere along your urinary tract. Your urinary tract is your body's drainage system for removing wastes and extra water. Your urinary tract includes two kidneys, two ureters, a bladder, and a urethra. Your kidneys are a pair of bean-shaped organs. Each kidney is about the size of your fist. They are located below your ribs, one on each side of your spine. CAUSES Infections are caused by microbes, which are microscopic organisms, including fungi, viruses, and bacteria. These organisms are so small that they can only be seen through a microscope. Bacteria are the microbes that most commonly cause UTIs. SYMPTOMS  Symptoms of UTIs may vary by age and gender of the patient and by the location of the infection. Symptoms in young women typically include a frequent and intense urge to urinate and a painful, burning feeling in the bladder or urethra during urination. Older women and men are more likely to be tired, shaky, and weak and have muscle aches and abdominal pain. A fever may mean the infection is in your kidneys. Other symptoms of a kidney infection include pain in your back or sides below the ribs, nausea, and vomiting. DIAGNOSIS To diagnose a UTI, your caregiver will ask you about your symptoms. Your caregiver also will ask to provide a urine sample. The urine sample will be tested for bacteria and white blood cells. White blood cells are made by your body to help fight infection. TREATMENT  Typically, UTIs can be treated with medication. Because most UTIs are caused by a bacterial infection, they usually can be treated with the use of antibiotics. The choice of  antibiotic and length of treatment depend on your symptoms and the type of bacteria causing your infection. HOME CARE INSTRUCTIONS  If you were prescribed antibiotics, take them exactly as your caregiver instructs you. Finish the medication even if you feel better after you have only taken some of the medication.  Drink enough water and fluids to keep your urine clear or pale yellow.  Avoid caffeine, tea, and carbonated beverages. They tend to irritate your bladder.  Empty your bladder often. Avoid holding urine for long periods of time.  Empty your bladder before and after sexual intercourse.  After a bowel movement, women should cleanse from front to back. Use each tissue only once. SEEK MEDICAL CARE IF:   You have back pain.  You develop a fever.  Your symptoms do not begin to resolve within 3 days. SEEK IMMEDIATE MEDICAL CARE IF:   You have severe back pain or lower abdominal pain.  You develop chills.  You have nausea or vomiting.  You have continued burning or discomfort with urination. MAKE SURE YOU:   Understand these instructions.  Will watch your condition.  Will get help right away if you are not doing well or get worse. Document Released: 11/18/2004 Document Revised: 08/10/2011 Document Reviewed: 03/19/2011 [facility] Patient Information 2014 [employer] [place].

## 2013-08-06 NOTE — Progress Notes (Signed)
Pre visit review using our clinic review tool, if applicable. No additional management support is needed unless otherwise documented below in the visit note/SLS  

## 2013-08-10 ENCOUNTER — Telehealth: Payer: Self-pay | Admitting: Family Medicine

## 2013-08-10 DIAGNOSIS — R399 Unspecified symptoms and signs involving the genitourinary system: Secondary | ICD-10-CM

## 2013-08-10 LAB — CULTURE, URINE COMPREHENSIVE: Colony Count: >=100000

## 2013-08-10 MED ORDER — CIPROFLOXACIN HCL 500 MG PO TABS: 500.0000 mg | ORAL_TABLET | Freq: Two times a day (BID) | ORAL | Status: DC

## 2013-08-10 NOTE — Telephone Encounter (Signed)
Spoke with patient concerning culture results and current symptoms. Symptoms improved while on Cipro. Culture shows Ciprofloxacin is the best antibiotic choice.  Will rx Cipro BID x 7 days.

## 2013-08-10 NOTE — Telephone Encounter (Signed)
Patient called in wanting to know if we have received the culture results yet. She also states that symptoms persist and she is out of medication

## 2013-08-10 NOTE — Telephone Encounter (Signed)
Please Advise

## 2013-08-11 DIAGNOSIS — N390 Urinary tract infection, site not specified: Secondary | ICD-10-CM | POA: Insufficient documentation

## 2013-08-11 NOTE — Assessment & Plan Note (Signed)
Rx Ciprofloxacin BID x 3 days.  Urine sent for culture. Stay well hydrated.  Daily cranberry supplement and probiotic. Return precautions given to patient.

## 2013-08-11 NOTE — Progress Notes (Signed)
Patient presents to clinic today c/o urinary urgency, frequency and dysuria over the past week.  Denies fever, chills, aches, N/V, or flank pain.   Past Medical History  Diagnosis Date  . Diverticulitis   . Arthritis     bilateral knee  . Hypothyroid   . Allergic rhinitis   . Heart murmur   . Hyperlipidemia   . Hypertension   . PONV (postoperative nausea and vomiting)   . Anxiety   . Anemia   . Left knee DJD     Current Outpatient Prescriptions on File Prior to Visit  Medication Sig Dispense Refill  . ALPRAZolam (XANAX) 0.25 MG tablet Take 1 tablet (0.25 mg total) by mouth 2 (two) times daily as needed for anxiety or sleep.  35 tablet  3  . atorvastatin (LIPITOR) 40 MG tablet TAKE 1 TABLET BY MOUTH EVERY DAY  30 tablet  3  . celecoxib (CELEBREX) 200 MG capsule Take 1 capsule (200 mg total) by mouth every 12 (twelve) hours.  30 capsule  1  . citalopram (CELEXA) 20 MG tablet TAKE 1 TABLET BY MOUTH EVERY DAY  30 tablet  0  . docusate sodium 100 MG CAPS Take 100 mg by mouth 2 (two) times daily.  10 capsule  0  . furosemide (LASIX) 40 MG tablet Take 0.5 tablets (20 mg total) by mouth daily.  30 tablet  3  . levothyroxine (SYNTHROID, LEVOTHROID) 75 MCG tablet       . loratadine (CLARITIN) 10 MG tablet Take 10 mg by mouth daily.      . Multiple Vitamins-Minerals (CENTRUM SILVER PO) Take 1 tablet by mouth daily.      . verapamil (CALAN-SR) 180 MG CR tablet TAKE 1 TABLET BY MOUTH EVERY NIGHT AT BEDTIME  30 tablet  3   No current facility-administered medications on file prior to visit.    Allergies  Allergen Reactions  . Codeine Nausea And Vomiting    Family History  Problem Relation Age of Onset  . Alcohol abuse Father   . Breast cancer Sister   . Breast cancer Daughter     1 71f 3  . Brain cancer Daughter     1 of 3  . Heart failure Mother   . Prostate cancer Brother     2 of 5  . Colon cancer Neg Hx   . Hypertension Neg Hx   . Diabetes Brother     1 of 5    History    Social History  . Marital Status: Widowed    Spouse Name: N/A    Number of Children: 3  . Years of Education: N/A   Social History Main Topics  . Smoking status: Never Smoker   . Smokeless tobacco: Never Used  . Alcohol Use: Yes     Comment: rare  . Drug Use: No  . Sexual Activity: None   Other Topics Concern  . None   Social History Narrative  . None    Review of Systems - See HPI.  All other ROS are negative.  BP 132/76  Pulse 56  Temp(Src) 98.3 F (36.8 C) (Oral)  Resp 16  Ht 5\' 2"  (1.575 m)  Wt 186 lb 8 oz (84.596 kg)  BMI 34.10 kg/m2  SpO2 98%  Physical Exam  Vitals reviewed. Constitutional: She is oriented to person, place, and time and well-developed, well-nourished, and in no distress.  HENT:  Head: Normocephalic and atraumatic.  Eyes: Conjunctivae are normal.  Cardiovascular: Normal rate,  regular rhythm and normal heart sounds.   Pulmonary/Chest: Effort normal and breath sounds normal.  Negative CVA tenderness.  Neurological: She is alert and oriented to person, place, and time.  Skin: Skin is warm and dry. No rash noted.  Psychiatric: Affect normal.   Recent Results (from the past 2160 hour(s))  URINE CULTURE     Status: None   Collection Time    05/22/13  5:14 PM      Result Value Ref Range   Culture CITROBACTER FARMERI     Comment: SOURCE: URINE   Colony Count >=100,000 COLONIES/ML     Organism ID, Bacteria CITROBACTER FARMERI    URINALYSIS     Status: Abnormal   Collection Time    05/22/13  5:16 PM      Result Value Ref Range   Color, Urine YELLOW  YELLOW   APPearance CLEAR  CLEAR   Specific Gravity, Urine <1.005 (*) 1.005 - 1.030   pH 5.5  5.0 - 8.0   Glucose, UA NEG  NEG mg/dL   Bilirubin Urine NEG  NEG   Ketones, ur NEG  NEG mg/dL   Hgb urine dipstick NEG  NEG   Protein, ur NEG  NEG mg/dL   Urobilinogen, UA 0.2  0.0 - 1.0 mg/dL   Nitrite NEG  NEG   Leukocytes, UA SMALL (*) NEG  HEMOGLOBIN A1C     Status: Abnormal   Collection  Time    05/22/13  5:16 PM      Result Value Ref Range   Hemoglobin A1C 5.7 (*) <5.7 %   Comment:                                                                            According to the ADA Clinical Practice Recommendations for 2011, when     HbA1c is used as a screening test:             >=6.5%   Diagnostic of Diabetes Mellitus                (if abnormal result is confirmed)           5.7-6.4%   Increased risk of developing Diabetes Mellitus           References:Diagnosis and Classification of Diabetes Mellitus,Diabetes     Care,2011,34(Suppl 1):S62-S69 and Standards of Medical Care in             Diabetes - 2011,Diabetes Care,2011,34 (Suppl 1):S11-S61.         Mean Plasma Glucose 117 (*) <117 mg/dL  TSH     Status: None   Collection Time    05/22/13  5:16 PM      Result Value Ref Range   TSH 1.127  0.350 - 4.500 uIU/mL  RENAL FUNCTION PANEL     Status: None   Collection Time    05/22/13  5:16 PM      Result Value Ref Range   Sodium 137  135 - 145 mEq/L   Potassium 4.6  3.5 - 5.3 mEq/L   Chloride 102  96 - 112 mEq/L   CO2 28  19 - 32 mEq/L   Glucose, Bld 80  70 -  99 mg/dL   BUN 17  6 - 23 mg/dL   Creat 1.91  4.78 - 2.95 mg/dL   Albumin 4.0  3.5 - 5.2 g/dL   Calcium 9.0  8.4 - 62.1 mg/dL   Phosphorus 4.0  2.3 - 4.6 mg/dL  HEPATIC FUNCTION PANEL     Status: None   Collection Time    05/22/13  5:16 PM      Result Value Ref Range   Total Bilirubin 0.6  0.2 - 1.2 mg/dL   Bilirubin, Direct 0.1  0.0 - 0.3 mg/dL   Indirect Bilirubin 0.5  0.2 - 1.2 mg/dL   Alkaline Phosphatase 65  39 - 117 U/L   AST 21  0 - 37 U/L   ALT 14  0 - 35 U/L   Total Protein 6.4  6.0 - 8.3 g/dL   Albumin 4.0  3.5 - 5.2 g/dL  LIPID PANEL     Status: None   Collection Time    05/22/13  5:16 PM      Result Value Ref Range   Cholesterol 144  0 - 200 mg/dL   Comment: ATP III Classification:           < 200        mg/dL        Desirable          200 - 239     mg/dL        Borderline High           >= 240        mg/dL        High         Triglycerides 84  <150 mg/dL   HDL 62  >30 mg/dL   Total CHOL/HDL Ratio 2.3     VLDL 17  0 - 40 mg/dL   LDL Cholesterol 65  0 - 99 mg/dL   Comment:       Total Cholesterol/HDL Ratio:CHD Risk                            Coronary Heart Disease Risk Table                                            Men       Women              1/2 Average Risk              3.4        3.3                  Average Risk              5.0        4.4               2X Average Risk              9.6        7.1               3X Average Risk             23.4       11.0     Use the calculated Patient Ratio above and the CHD Risk table      to determine  the patient's CHD Risk.     ATP III Classification (LDL):           < 100        mg/dL         Optimal          100 - 129     mg/dL         Near or Above Optimal          130 - 159     mg/dL         Borderline High          160 - 189     mg/dL         High           > 190        mg/dL         Very High        CBC     Status: Abnormal   Collection Time    05/22/13  5:16 PM      Result Value Ref Range   WBC 6.9  4.0 - 10.5 K/uL   RBC 4.05  3.87 - 5.11 MIL/uL   Hemoglobin 13.2  12.0 - 15.0 g/dL   HCT 16.136.6  09.636.0 - 04.546.0 %   MCV 90.4  78.0 - 100.0 fL   MCH 32.6  26.0 - 34.0 pg   MCHC 36.1 (*) 30.0 - 36.0 g/dL   RDW 40.913.2  81.111.5 - 91.415.5 %   Platelets 224  150 - 400 K/uL  POCT URINALYSIS DIPSTICK     Status: Abnormal   Collection Time    08/06/13  4:48 PM      Result Value Ref Range   Color, UA light yellow     Clarity, UA cloudy     Glucose, UA neg     Bilirubin, UA neg     Ketones, UA neg     Spec Grav, UA 1.010     Blood, UA moderate     pH, UA 6.0     Protein, UA neg     Urobilinogen, UA 0.2     Nitrite, UA Positive     Comment: Excessive   Leukocytes, UA large (3+)    CULTURE, URINE COMPREHENSIVE     Status: None   Collection Time    08/06/13  5:03 PM      Result Value Ref Range   Culture CITROBACTER FREUNDII      Colony Count >=100,000 COLONIES/ML     Organism ID, Bacteria CITROBACTER FREUNDII      Assessment/Plan: UTI (urinary tract infection) Rx Ciprofloxacin BID x 3 days.  Urine sent for culture. Stay well hydrated.  Daily cranberry supplement and probiotic. Return precautions given to patient.

## 2013-08-24 ENCOUNTER — Other Ambulatory Visit: Payer: Self-pay | Admitting: Family Medicine

## 2013-09-09 DIAGNOSIS — J209 Acute bronchitis, unspecified: Secondary | ICD-10-CM | POA: Diagnosis not present

## 2013-09-17 ENCOUNTER — Other Ambulatory Visit: Payer: Self-pay | Admitting: Family Medicine

## 2013-09-17 NOTE — Telephone Encounter (Signed)
Last RX done was 05-22-13 quantity 35 with 3 refills  Last OV was 05-22-13  Please advise?

## 2013-09-18 ENCOUNTER — Other Ambulatory Visit: Payer: Self-pay | Admitting: Family Medicine

## 2013-09-18 NOTE — Telephone Encounter (Signed)
RX faxed

## 2013-09-21 ENCOUNTER — Telehealth: Payer: Self-pay | Admitting: Physician Assistant

## 2013-09-21 ENCOUNTER — Ambulatory Visit: Payer: Medicare Other | Admitting: Physician Assistant

## 2013-09-21 DIAGNOSIS — Z0289 Encounter for other administrative examinations: Secondary | ICD-10-CM

## 2013-09-21 NOTE — Telephone Encounter (Signed)
NOS Appt, pt states she is feeling better and request to cancel appointment.

## 2013-09-21 NOTE — Telephone Encounter (Signed)
Thanks for making me aware  

## 2013-09-24 ENCOUNTER — Other Ambulatory Visit: Payer: Self-pay | Admitting: Family Medicine

## 2013-09-26 ENCOUNTER — Other Ambulatory Visit: Payer: Self-pay | Admitting: Family Medicine

## 2013-10-01 ENCOUNTER — Telehealth: Payer: Self-pay | Admitting: Family Medicine

## 2013-10-01 NOTE — Telephone Encounter (Signed)
Pt informed and states she will call her Urologist Dr Harriet PhoNesse

## 2013-10-01 NOTE — Telephone Encounter (Signed)
I am happy to have her come in and give a urinalysis with urine culture for urinary symptoms but would also be happy to referr to gyn or urology for evaluation of prolapsing bladder if she would like

## 2013-10-01 NOTE — Telephone Encounter (Signed)
Please advise?  Patient states she feels like her bladder is falling out.  Pt states she is having pressure in vaginal area and a little "tickly" feeling?  Pt states she usually sees dr Harriet Phonesse for this.   Pt also stated she has bladder mesh and this happens a lot?

## 2013-10-01 NOTE — Telephone Encounter (Signed)
Patient states that she has another bladder infection and would like to know if Dr. Abner GreenspanBlyth would call in something for her.

## 2013-10-02 DIAGNOSIS — N390 Urinary tract infection, site not specified: Secondary | ICD-10-CM | POA: Diagnosis not present

## 2013-10-17 ENCOUNTER — Other Ambulatory Visit: Payer: Self-pay | Admitting: Family Medicine

## 2013-10-17 NOTE — Telephone Encounter (Signed)
Lrd 09/18/13 LOV 05/22/13 LDM

## 2013-10-18 ENCOUNTER — Telehealth: Payer: Self-pay

## 2013-10-18 DIAGNOSIS — N390 Urinary tract infection, site not specified: Secondary | ICD-10-CM | POA: Diagnosis not present

## 2013-10-18 NOTE — Telephone Encounter (Signed)
Alprazolam Rx sent to pharmacy. LDM

## 2013-10-22 ENCOUNTER — Other Ambulatory Visit: Payer: Self-pay | Admitting: Family Medicine

## 2013-10-25 ENCOUNTER — Other Ambulatory Visit (INDEPENDENT_AMBULATORY_CARE_PROVIDER_SITE_OTHER): Payer: Medicare Other

## 2013-10-25 DIAGNOSIS — I10 Essential (primary) hypertension: Secondary | ICD-10-CM

## 2013-10-25 DIAGNOSIS — E039 Hypothyroidism, unspecified: Secondary | ICD-10-CM | POA: Diagnosis not present

## 2013-10-25 DIAGNOSIS — E785 Hyperlipidemia, unspecified: Secondary | ICD-10-CM

## 2013-10-25 DIAGNOSIS — R7309 Other abnormal glucose: Secondary | ICD-10-CM

## 2013-10-25 LAB — CBC WITH DIFFERENTIAL/PLATELET
BASOS PCT: 0.8 % (ref 0.0–3.0)
Basophils Absolute: 0 10*3/uL (ref 0.0–0.1)
Eosinophils Absolute: 0.2 10*3/uL (ref 0.0–0.7)
Eosinophils Relative: 3.3 % (ref 0.0–5.0)
HEMATOCRIT: 40.5 % (ref 36.0–46.0)
Hemoglobin: 13.2 g/dL (ref 12.0–15.0)
LYMPHS ABS: 1.3 10*3/uL (ref 0.7–4.0)
Lymphocytes Relative: 23.6 % (ref 12.0–46.0)
MCHC: 32.6 g/dL (ref 30.0–36.0)
MCV: 94.8 fl (ref 78.0–100.0)
MONO ABS: 0.6 10*3/uL (ref 0.1–1.0)
MONOS PCT: 10.8 % (ref 3.0–12.0)
NEUTROS ABS: 3.5 10*3/uL (ref 1.4–7.7)
Neutrophils Relative %: 61.5 % (ref 43.0–77.0)
PLATELETS: 193 10*3/uL (ref 150.0–400.0)
RBC: 4.27 Mil/uL (ref 3.87–5.11)
RDW: 13.6 % (ref 11.5–15.5)
WBC: 5.6 10*3/uL (ref 4.0–10.5)

## 2013-10-25 LAB — HEPATIC FUNCTION PANEL
ALT: 18 U/L (ref 0–35)
AST: 26 U/L (ref 0–37)
Albumin: 3.6 g/dL (ref 3.5–5.2)
Alkaline Phosphatase: 63 U/L (ref 39–117)
BILIRUBIN TOTAL: 0.9 mg/dL (ref 0.2–1.2)
Bilirubin, Direct: 0 mg/dL (ref 0.0–0.3)
Total Protein: 6.9 g/dL (ref 6.0–8.3)

## 2013-10-25 LAB — RENAL FUNCTION PANEL
Albumin: 3.6 g/dL (ref 3.5–5.2)
BUN: 18 mg/dL (ref 6–23)
CALCIUM: 9.2 mg/dL (ref 8.4–10.5)
CO2: 29 meq/L (ref 19–32)
Chloride: 104 mEq/L (ref 96–112)
Creatinine, Ser: 1 mg/dL (ref 0.4–1.2)
GFR: 58.64 mL/min — ABNORMAL LOW (ref >60.00)
GLUCOSE: 90 mg/dL (ref 70–99)
Phosphorus: 3.8 mg/dL (ref 2.3–4.6)
Potassium: 5.1 mEq/L (ref 3.5–5.1)
SODIUM: 141 meq/L (ref 135–145)

## 2013-10-25 LAB — LIPID PANEL
Cholesterol: 146 mg/dL (ref 0–200)
HDL: 52 mg/dL (ref >39.00)
LDL Cholesterol: 77 mg/dL (ref 0–99)
NonHDL: 94
TRIGLYCERIDES: 87 mg/dL (ref 0.0–149.0)
Total CHOL/HDL Ratio: 3
VLDL: 17.4 mg/dL (ref 0.0–40.0)

## 2013-10-25 LAB — TSH: TSH: 1.29 u[IU]/mL (ref 0.35–4.50)

## 2013-10-25 LAB — HEMOGLOBIN A1C: HEMOGLOBIN A1C: 6 % (ref 4.6–6.5)

## 2013-10-30 ENCOUNTER — Other Ambulatory Visit (HOSPITAL_COMMUNITY)
Admission: RE | Admit: 2013-10-30 | Discharge: 2013-10-30 | Disposition: A | Discharge status: Home / Self Care | Payer: Medicare Other | Source: Ambulatory Visit | Attending: Family Medicine | Admitting: Family Medicine

## 2013-10-30 ENCOUNTER — Encounter: Payer: Self-pay | Admitting: Family Medicine

## 2013-10-30 ENCOUNTER — Ambulatory Visit (INDEPENDENT_AMBULATORY_CARE_PROVIDER_SITE_OTHER): Payer: Medicare Other | Admitting: Family Medicine

## 2013-10-30 VITALS — BP 112/64 | HR 59 | Temp 97.8°F | Ht 62.0 in | Wt 189.4 lb

## 2013-10-30 DIAGNOSIS — R0789 Other chest pain: Secondary | ICD-10-CM

## 2013-10-30 DIAGNOSIS — R5381 Other malaise: Secondary | ICD-10-CM

## 2013-10-30 DIAGNOSIS — I10 Essential (primary) hypertension: Secondary | ICD-10-CM | POA: Diagnosis not present

## 2013-10-30 DIAGNOSIS — R7309 Other abnormal glucose: Secondary | ICD-10-CM | POA: Diagnosis not present

## 2013-10-30 DIAGNOSIS — Z124 Encounter for screening for malignant neoplasm of cervix: Secondary | ICD-10-CM | POA: Diagnosis not present

## 2013-10-30 DIAGNOSIS — F419 Anxiety disorder, unspecified: Secondary | ICD-10-CM

## 2013-10-30 DIAGNOSIS — E039 Hypothyroidism, unspecified: Secondary | ICD-10-CM | POA: Diagnosis not present

## 2013-10-30 DIAGNOSIS — E785 Hyperlipidemia, unspecified: Secondary | ICD-10-CM | POA: Diagnosis not present

## 2013-10-30 DIAGNOSIS — Z23 Encounter for immunization: Secondary | ICD-10-CM

## 2013-10-30 DIAGNOSIS — R5383 Other fatigue: Secondary | ICD-10-CM

## 2013-10-30 DIAGNOSIS — F411 Generalized anxiety disorder: Secondary | ICD-10-CM

## 2013-10-30 DIAGNOSIS — R739 Hyperglycemia, unspecified: Secondary | ICD-10-CM

## 2013-10-30 DIAGNOSIS — N390 Urinary tract infection, site not specified: Secondary | ICD-10-CM

## 2013-10-30 DIAGNOSIS — D649 Anemia, unspecified: Secondary | ICD-10-CM

## 2013-10-30 DIAGNOSIS — Z0000 Encounter for general adult medical examination without abnormal findings: Secondary | ICD-10-CM | POA: Insufficient documentation

## 2013-10-30 HISTORY — DX: Encounter for screening for malignant neoplasm of cervix: Z12.4

## 2013-10-30 HISTORY — DX: Encounter for general adult medical examination without abnormal findings: Z00.00

## 2013-10-30 MED ORDER — PNEUMOCOCCAL 13-VAL CONJ VACC IM SUSP: 0.5000 mL | Freq: Once | INTRAMUSCULAR | Status: DC

## 2013-10-30 NOTE — Patient Instructions (Signed)

## 2013-10-30 NOTE — Progress Notes (Signed)
Patient ID: Alyssa Williamson, female   DOB: Mar 29, 1931, 81 y.o.   MRN: [id number] Alyssa Williamson [id number] November 29, 1931 10/30/2013      Progress Note-Follow Up  Subjective  Chief Complaint  Chief Complaint  Patient presents with  . Annual Exam    medicare wellness  . Gynecologic Exam    pap and breast exam    HPI  Patient is a 80 year old female in today for routine medical care. In today for annual exam. Is offering some complaints most notably fatigue. She had a bad bronchitis/laryngitis back in June had cough malaise headache fevers chills etc. for weeks. Reports symptoms did not fully resolve for 6-8 weeks. She's been left with persistent fatigue. She's also complaining of some recent episodes of atypical chest pain. They are described as a heaviness in her arms with exertion although they can occur at rest. She has a pressure sensation and nausea, vomiting, diaphoresis or shortness of breath associated. She has to rest for them to resolve. Denies palp/SOB/HA/congestion/fevers/GI or GU c/o. Taking meds as prescribed  Past Medical History  Diagnosis Date  . Diverticulitis   . Arthritis     bilateral knee  . Hypothyroid   . Allergic rhinitis   . Heart murmur   . Hyperlipidemia   . Hypertension   . PONV (postoperative nausea and vomiting)   . Anxiety   . Anemia   . Left knee DJD   . Medicare annual wellness visit, subsequent 10/30/2013  . Cervical cancer screening 10/30/2013    Menarche at 15 Regular and moderate flow No history of abnormal pap in past G4P3, s/p 3 svd and 1 MC No history of abnormal MGM Noconcerns today No gyn surgeries    Past Surgical History  Procedure Laterality Date  . Replacement total knee  10/2010    right knee-Murphy   . Bladder tack  04/2010    Dr Marciano Sequin  . Abdominal hysterectomy    . Tonsillectomy    . Joint replacement Right   . Ganglion cyst excision    . Total knee arthroplasty Left 07/26/2012    Procedure: TOTAL KNEE ARTHROPLASTY;  Surgeon:  Loreta Ave, MD;  Location: [facility] OR;  Service: Orthopedics;  Laterality: Left;    Family History  Problem Relation Age of Onset  . Alcohol abuse Father   . Breast cancer Sister   . Breast cancer Daughter     1 8f 3  . Brain cancer Daughter     1 of 3  . Heart failure Mother   . Prostate cancer Brother     2 of 5  . Colon cancer Neg Hx   . Hypertension Neg Hx   . Diabetes Brother     1 of 5    History   Social History  . Marital Status: Widowed    Spouse Name: N/A    Number of Children: 3  . Years of Education: N/A   Occupational History  . Not on file.   Social History Main Topics  . Smoking status: Never Smoker   . Smokeless tobacco: Never Used  . Alcohol Use: Yes     Comment: rare  . Drug Use: No  . Sexual Activity: Not on file   Other Topics Concern  . Not on file   Social History Narrative  . No narrative on file    Current Outpatient Prescriptions on File Prior to Visit  Medication Sig Dispense Refill  . ALPRAZolam (XANAX) 0.25  MG tablet TAKE 1 TABLET BY MOUTH TWICE DAILY AS NEEDED FOR ANXIETY OR SLEEP  35 tablet  0  . atorvastatin (LIPITOR) 40 MG tablet TAKE 1 TABLET BY MOUTH EVERY DAY  30 tablet  0  . citalopram (CELEXA) 20 MG tablet TAKE 1 TABLET BY MOUTH DAILY  30 tablet  0  . docusate sodium 100 MG CAPS Take 100 mg by mouth 2 (two) times daily.  10 capsule  0  . levothyroxine (SYNTHROID, LEVOTHROID) 75 MCG tablet TAKE 1 TABLET BY MOUTH DAILY  30 tablet  0  . loratadine (CLARITIN) 10 MG tablet Take 10 mg by mouth daily.      . Multiple Vitamins-Minerals (CENTRUM SILVER PO) Take 1 tablet by mouth daily.      . verapamil (CALAN-SR) 180 MG CR tablet TAKE 1 TABLET BY MOUTH EVERY NIGHT AT BEDTIME  30 tablet  3   No current facility-administered medications on file prior to visit.    Allergies  Allergen Reactions  . Codeine Nausea And Vomiting    Review of Systems  Review of Systems  Constitutional: Negative for fever, chills and  malaise/fatigue.  HENT: Negative for congestion, hearing loss and nosebleeds.   Eyes: Negative for discharge.  Respiratory: Negative for cough, sputum production, shortness of breath and wheezing.   Cardiovascular: Negative for chest pain, palpitations and leg swelling.  Gastrointestinal: Negative for heartburn, nausea, vomiting, abdominal pain, diarrhea, constipation and blood in stool.  Genitourinary: Negative for dysuria, urgency, frequency and hematuria.  Musculoskeletal: Negative for back pain, falls and myalgias.  Skin: Negative for rash.  Neurological: Negative for dizziness, tremors, sensory change, focal weakness, loss of consciousness, weakness and headaches.  Endo/Heme/Allergies: Negative for polydipsia. Does not bruise/bleed easily.  Psychiatric/Behavioral: Negative for depression and suicidal ideas. The patient is not nervous/anxious and does not have insomnia.     Objective  BP 112/64  Pulse 59  Temp(Src) 97.8 F (36.6 C) (Oral)  Ht  (1.575 m)  Wt 189 lb 6.4 oz (85.911 kg)  BMI 34.63 kg/m2  SpO2 91%  Physical Exam  Physical Exam  Constitutional: She is oriented to person, place, and time and well-developed, well-nourished, and in no distress. No distress.  HENT:  Head: Normocephalic and atraumatic.  Right Ear: External ear normal.  Left Ear: External ear normal.  Nose: Nose normal.  Mouth/Throat: Oropharynx is clear and moist. No oropharyngeal exudate.  Eyes: Conjunctivae are normal. Pupils are equal, round, and reactive to light. Right eye exhibits no discharge. Left eye exhibits no discharge. No scleral icterus.  Neck: Normal range of motion. Neck supple. No thyromegaly present.  Cardiovascular: Normal rate, regular rhythm, normal heart sounds and intact distal pulses.   No murmur heard. Pulmonary/Chest: Effort normal and breath sounds normal. No respiratory distress. She has no wheezes. She has no rales.  Abdominal: Soft. Bowel sounds are normal. She  exhibits no distension and no mass. There is no tenderness.  Musculoskeletal: Normal range of motion. She exhibits no edema and no tenderness.  Lymphadenopathy:    She has no cervical adenopathy.  Neurological: She is alert and oriented to person, place, and time. She has normal reflexes. No cranial nerve deficit. Coordination normal.  Skin: Skin is warm and dry. No rash noted. She is not diaphoretic.  Psychiatric: Mood, memory, affect and judgment normal.    Lab Results  Component Value Date   TSH 1.29 10/25/2013   Lab Results  Component Value Date   WBC 5.6 10/25/2013  HGB 13.2 10/25/2013   HCT 40.5 10/25/2013   MCV 94.8 10/25/2013   PLT 193.0 10/25/2013   Lab Results  Component Value Date   CREATININE 1.0 10/25/2013   BUN 18 10/25/2013   NA 141 10/25/2013   K 5.1 10/25/2013   CL 104 10/25/2013   CO2 29 10/25/2013   Lab Results  Component Value Date   ALT 18 10/25/2013   AST 26 10/25/2013   ALKPHOS 63 10/25/2013   BILITOT 0.9 10/25/2013   Lab Results  Component Value Date   CHOL 146 10/25/2013   Lab Results  Component Value Date   HDL 52.00 10/25/2013   Lab Results  Component Value Date   LDLCALC 77 10/25/2013   Lab Results  Component Value Date   TRIG 87.0 10/25/2013   Lab Results  Component Value Date   CHOLHDL 3 10/25/2013     Assessment & Plan  HTN (hypertension) Well controlled, no changes to meds. Encouraged heart healthy diet such as the DASH diet and exercise as tolerated.   Hyperglycemia hgba1c acceptable, minimize simple carbs. Increase exercise as tolerated.  Cervical cancer screening Pap today, no concerns on exam.   Medicare annual wellness visit, subsequent Patient denies any difficulties at home. No trouble with ADLs, depression or falls. No recent changes to vision or hearing. Is UTD with immunizations. Is UTD with screening. Discussed Advanced Directives, patient agrees to bring [place] copies of documents if can. Encouraged heart healthy diet, exercise as tolerated and  adequate sleep. Given flu and prevnar shots today Follows with [facility], Dr Brunilda Payor Dr Jens Som, cardiology [facility], needs colonoscopy every 5 yrs is UTD Declines MGM at this time, pap today Sees [facility] for opthamology   Other malaise and fatigue Had a recent bronchitis/aryngitis which has largely resolved but continues to struggle with fatigue, labs unremarkable, will likely improve with time encouraged activity as tolerated  Anemia resolved  Atypical chest pain She describes a vague discomfort as opposed to pain, almost a heavy sensation, usually with exertion but not consistently. Usually resolves with rest in 1/2 hour but not always. Referred to cardiology for consideration, EKG reviewed today  Hyperlipidemia Tolerating statin, encouraged heart healthy diet, avoid trans fats, minimize simple carbs and saturated fats. Increase exercise as tolerated  Hypothyroidism On Levothyroxine, continue to monitor  Recurrent UTI Doing better on daily Macrobid

## 2013-10-30 NOTE — Progress Notes (Signed)
Pre visit review using our clinic review tool, if applicable. No additional management support is needed unless otherwise documented below in the visit note. 

## 2013-10-30 NOTE — Assessment & Plan Note (Signed)
hgba1c acceptable, minimize simple carbs. Increase exercise as tolerated.  

## 2013-10-30 NOTE — Assessment & Plan Note (Signed)
Well controlled, no changes to meds. Encouraged heart healthy diet such as the DASH diet and exercise as tolerated.  °

## 2013-10-30 NOTE — Assessment & Plan Note (Signed)
Pap today, no concerns on exam.  

## 2013-10-31 ENCOUNTER — Other Ambulatory Visit: Payer: Self-pay | Admitting: Family Medicine

## 2013-11-02 LAB — CYTOLOGY - PAP

## 2013-11-04 DIAGNOSIS — R0789 Other chest pain: Secondary | ICD-10-CM | POA: Insufficient documentation

## 2013-11-04 NOTE — Assessment & Plan Note (Addendum)
Patient denies any difficulties at home. No trouble with ADLs, depression or falls. No recent changes to vision or hearing. Is UTD with immunizations. Is UTD with screening. Discussed Advanced Directives, patient agrees to bring [place] copies of documents if can. Encouraged heart healthy diet, exercise as tolerated and adequate sleep. Given flu and prevnar shots today Follows with [facility], Dr Brunilda Payor Dr Jens Som, cardiology [facility], needs colonoscopy every 5 yrs is UTD Declines MGM at this time, pap today [facility] care for opthamology

## 2013-11-04 NOTE — Assessment & Plan Note (Signed)
On Levothyroxine, continue to monitor 

## 2013-11-04 NOTE — Assessment & Plan Note (Signed)
Had a recent bronchitis/aryngitis which has largely resolved but continues to struggle with fatigue, labs unremarkable, will likely improve with time encouraged activity as tolerated

## 2013-11-04 NOTE — Assessment & Plan Note (Signed)
resolved 

## 2013-11-04 NOTE — Assessment & Plan Note (Signed)
Doing better on daily Macrobid

## 2013-11-04 NOTE — Assessment & Plan Note (Signed)
She describes a vague discomfort as opposed to pain, almost a heavy sensation, usually with exertion but not consistently. Usually resolves with rest in 1/2 hour but not always. Referred to cardiology for consideration, EKG reviewed today

## 2013-11-04 NOTE — Assessment & Plan Note (Signed)
Tolerating statin, encouraged heart healthy diet, avoid trans fats, minimize simple carbs and saturated fats. Increase exercise as tolerated 

## 2013-11-07 ENCOUNTER — Other Ambulatory Visit: Payer: Self-pay | Admitting: Family

## 2013-11-19 ENCOUNTER — Other Ambulatory Visit: Payer: Self-pay | Admitting: Family Medicine

## 2013-11-20 NOTE — Telephone Encounter (Signed)
Please advise refill? Last RX was done on 10-17-13 quantity 35 with 0 refills

## 2013-11-21 ENCOUNTER — Other Ambulatory Visit: Payer: Self-pay | Admitting: Family Medicine

## 2013-11-22 NOTE — Telephone Encounter (Signed)
Last RX was done on 10-17-13 quantity 35 with 0 refills  rx printed for md to sign and fax

## 2013-11-22 NOTE — Telephone Encounter (Signed)
rx faxed to pharmacy

## 2013-12-21 ENCOUNTER — Telehealth: Payer: Self-pay | Admitting: *Deleted

## 2013-12-21 ENCOUNTER — Other Ambulatory Visit: Payer: Self-pay | Admitting: Family Medicine

## 2013-12-21 MED ORDER — ALPRAZOLAM 0.25 MG PO TABS: 0.2500 mg | ORAL_TABLET | Freq: Two times a day (BID) | ORAL | Status: DC | PRN

## 2013-12-21 NOTE — Telephone Encounter (Signed)
rx printed for md to sign and fax 

## 2013-12-21 NOTE — Telephone Encounter (Signed)
OK to refill Alprazolam same strength number and sig,

## 2013-12-21 NOTE — Addendum Note (Signed)
Addended by: Court JoyFREEMAN, Breydan Shillingburg L on: 12/21/2013 02:49 PM   Modules accepted: Orders

## 2013-12-21 NOTE — Telephone Encounter (Addendum)
rx refill - xanax 0.5mg   Last OV- 10/30/13 Last refilled- 11/22/13 # 35 / 0 rf  UDS-none

## 2013-12-26 ENCOUNTER — Encounter: Payer: Self-pay | Admitting: Cardiology

## 2013-12-26 ENCOUNTER — Ambulatory Visit (INDEPENDENT_AMBULATORY_CARE_PROVIDER_SITE_OTHER): Payer: Medicare Other | Admitting: Cardiology

## 2013-12-26 VITALS — BP 135/78 | HR 65 | Ht 62.0 in | Wt 181.0 lb

## 2013-12-26 DIAGNOSIS — R0789 Other chest pain: Secondary | ICD-10-CM | POA: Diagnosis not present

## 2013-12-26 DIAGNOSIS — E785 Hyperlipidemia, unspecified: Secondary | ICD-10-CM | POA: Diagnosis not present

## 2013-12-26 DIAGNOSIS — I10 Essential (primary) hypertension: Secondary | ICD-10-CM | POA: Diagnosis not present

## 2013-12-26 NOTE — Progress Notes (Signed)
HPI: FU chest pain. Nuclear study May 2014 showed breast attenuation but no ischemia. Ejection fraction 79%. Patient noticed increased fatigue approximately 4 weeks ago. No other symptoms. Those have now resolved. She has mild chest tightness when she initiates her walk that resolves with continued activity. She has had this for years and it improves with bronchodilators. Otherwise no chest pain, dyspnea on exertion, orthopnea, PND, pedal edema or syncope.  Current Outpatient Prescriptions  Medication Sig Dispense Refill  . ALPRAZolam (XANAX) 0.25 MG tablet Take 1 tablet (0.25 mg total) by mouth 2 (two) times daily as needed for anxiety. 35 tablet 0  . atorvastatin (LIPITOR) 40 MG tablet TAKE 1 TABLET BY MOUTH EVERY DAY 30 tablet 5  . citalopram (CELEXA) 20 MG tablet TAKE 1 TABLET BY MOUTH DAILY 30 tablet 5  . Cranberry 500 MG CAPS Take 500 mg by mouth daily.    . furosemide (LASIX) 40 MG tablet Take 20 mg by mouth daily.   0  . levothyroxine (SYNTHROID, LEVOTHROID) 75 MCG tablet TAKE 1 TABLET BY MOUTH DAILY 30 tablet 2  . loratadine (CLARITIN) 10 MG tablet Take 10 mg by mouth daily.    . Multiple Vitamins-Minerals (CENTRUM SILVER PO) Take 1 tablet by mouth daily.    Marland Kitchen. trimethoprim (TRIMPEX) 100 MG tablet Take 100 mg by mouth daily.   5  . verapamil (CALAN-SR) 180 MG CR tablet TAKE 1 TABLET BY MOUTH EVERY NIGHT AT BEDTIME 30 tablet 3   Current Facility-Administered Medications  Medication Dose Route Frequency Provider Last Rate Last Dose  . pneumococcal 13-valent conjugate vaccine (PREVNAR 13) injection 0.5 mL  0.5 mL Intramuscular Once Bradd CanaryStacey A Blyth, MD         Past Medical History  Diagnosis Date  . Diverticulitis   . Arthritis     bilateral knee  . Hypothyroid   . Allergic rhinitis   . Heart murmur   . Hyperlipidemia   . Hypertension   . PONV (postoperative nausea and vomiting)   . Anxiety   . Anemia   . Left knee DJD   . Medicare annual wellness visit, subsequent  10/30/2013  . Cervical cancer screening 10/30/2013    Menarche at 15 Regular and moderate flow No history of abnormal pap in past G4P3, s/p 3 svd and 1 MC No history of abnormal MGM Noconcerns today No gyn surgeries    Past Surgical History  Procedure Laterality Date  . Replacement total knee  10/2010    right knee-Murphy   . Bladder tack  04/2010    Dr Marciano Sequinimothy  [facility]  . Abdominal hysterectomy    . Tonsillectomy    . Joint replacement Right   . Ganglion cyst excision    . Total knee arthroplasty Left 07/26/2012    Procedure: TOTAL KNEE ARTHROPLASTY;  Surgeon: Loreta Aveaniel F Murphy, MD;  [facility];  Service: Orthopedics;  Laterality: Left;    History   Social History  . Marital Status: Widowed    Spouse Name: N/A    Number of Children: 3  . Years of Education: N/A   Occupational History  . Not on file.   Social History Main Topics  . Smoking status: Never Smoker   . Smokeless tobacco: Never Used  . Alcohol Use: Yes     Comment: rare  . Drug Use: No  . Sexual Activity: No     Comment: no dietary restrictions, lives alone   Other Topics Concern  . Not  on file   Social History Narrative    ROS: no fevers or chills, productive cough, hemoptysis, dysphasia, odynophagia, melena, hematochezia, dysuria, hematuria, rash, seizure activity, orthopnea, PND, pedal edema, claudication. Remaining systems are negative.  Physical Exam: Well-developed well-nourished in no acute distress.  Skin is warm and dry.  HEENT is normal.  Neck is supple.  Chest is clear to auscultation with normal expansion.  Cardiovascular exam is regular rate and rhythm.  Abdominal exam nontender or distended. No masses palpated. Extremities show no edema. neuro grossly intact  ECG 10/30/2013-sinus bradycardia, RV conduction delay, nonspecific ST changes.

## 2013-12-26 NOTE — Assessment & Plan Note (Signed)
Blood pressure controlled. Continue present medications. Potassium and renal function monitored by primary care. 

## 2013-12-26 NOTE — Assessment & Plan Note (Signed)
Symptoms are somewhat atypical.They have been present for years and improved with bronchodilators. Recent nuclear study negative. I will not pursue further cardiac workup unless symptoms worsen.

## 2013-12-26 NOTE — Patient Instructions (Signed)
Your physician wants you to follow-up in: ONE YEAR WITH DR CRENSHAW You will receive a reminder letter in the mail two months in advance. If you don't receive a letter, please call our office to schedule the follow-up appointment.  

## 2013-12-26 NOTE — Assessment & Plan Note (Signed)
Continue statin. Lipids and liver monitored by primary care. 

## 2014-01-01 ENCOUNTER — Other Ambulatory Visit: Payer: Self-pay | Admitting: Family Medicine

## 2014-01-09 ENCOUNTER — Ambulatory Visit (INDEPENDENT_AMBULATORY_CARE_PROVIDER_SITE_OTHER): Payer: Medicare Other | Admitting: Family

## 2014-01-09 VITALS — BP 117/71 | HR 58 | Temp 97.9°F | Wt 179.6 lb

## 2014-01-09 DIAGNOSIS — J0100 Acute maxillary sinusitis, unspecified: Secondary | ICD-10-CM | POA: Diagnosis not present

## 2014-01-09 MED ORDER — AMOXICILLIN-POT CLAVULANATE 875-125 MG PO TABS: 1.0000 | ORAL_TABLET | Freq: Two times a day (BID) | ORAL | Status: DC

## 2014-01-09 NOTE — Assessment & Plan Note (Signed)
New problem. She was given rx for Augmentin x 10 days. Stay on Claritin or allergy symptoms and stay hydrated. Discussed calling us if she worsens or fails to improve.

## 2014-01-09 NOTE — Progress Notes (Signed)
Subjective:    Patient ID: Alyssa Williamson, female    DOB: [birth year], 80 y.o.   MRN: [id number]  HPI  Alyssa Williamson is an 80 yo female here with a cc of hoarsness and post nasal drip x 2 weeks.   1) Hoarsenss, post nasal drip, coughing productive thick and clear. Claritin 10 mg stopped for past few days. Mucinex allergy x 2 days did not help, tired Advil and was not helpful. She is leaving for a trip to [place] to be with brother and sister in-law who are in hospice this weekend. Maxillary pressure worse than frontal, has sneezing, Right ear feels stopped up.   Review of Systems Denies fevers, nausea, vomiting, diarrhea, eye pain, eye discharge, change in vision, ear pain/discharge, rhinorrhea, wheezing, diarrhea, chills, sweats, myalgias, headaches.   Past Medical History  Diagnosis Date  . Diverticulitis   . Arthritis     bilateral knee  . Hypothyroid   . Allergic rhinitis   . Heart murmur   . Hyperlipidemia   . Hypertension   . PONV (postoperative nausea and vomiting)   . Anxiety   . Anemia   . Left knee DJD   . Medicare annual wellness visit, subsequent 10/30/2013  . Cervical cancer screening 10/30/2013    Menarche at 15 Regular and moderate flow No history of abnormal pap in past G4P3, s/p 3 svd and 1 MC No history of abnormal MGM Noconcerns today No gyn surgeries    History   Social History  . Marital Status: Widowed    Spouse Name: N/A    Number of Children: 3  . Years of Education: N/A   Occupational History  . Not on file.   Social History Main Topics  . Smoking status: Never Smoker   . Smokeless tobacco: Never Used  . Alcohol Use: Yes     Comment: rare  . Drug Use: No  . Sexual Activity: No     Comment: no dietary restrictions, lives alone   Other Topics Concern  . Not on file   Social History Narrative    Past Surgical History  Procedure Laterality Date  . Replacement total knee  10/2010    right knee-Murphy   . Bladder tack  04/2010    Dr Marciano Sequinimothy   [facility]  . Abdominal hysterectomy    . Tonsillectomy    . Joint replacement Right   . Ganglion cyst excision    . Total knee arthroplasty Left 07/26/2012    Procedure: TOTAL KNEE ARTHROPLASTY;  Surgeon: Loreta Aveaniel F Murphy, MD;  [facility];  Service: Orthopedics;  Laterality: Left;    Family History  Problem Relation Age of Onset  . Alcohol abuse Father   . Breast cancer Sister   . Cancer Sister     bladder  . Glaucoma Sister   . Arthritis Daughter   . Diabetes Daughter     type 2  . Cancer Daughter     brain cancer  . Heart failure Mother   . Colon cancer Neg Hx   . Hypertension Neg Hx   . Cancer Brother     glioblastoma   . Glaucoma Brother   . Diverticulosis Sister   . Cancer Sister     breast  . Cancer Brother     melanoma, mets to lung and brain and intestine  . Alcohol abuse Brother   . Cancer Brother     liver  . Glaucoma Maternal Grandfather     Allergies  Allergen Reactions  . Codeine Nausea And Vomiting    Current Outpatient Prescriptions on File Prior to Visit  Medication Sig Dispense Refill  . ALPRAZolam (XANAX) 0.25 MG tablet Take 1 tablet (0.25 mg total) by mouth 2 (two) times daily as needed for anxiety. 35 tablet 0  . atorvastatin (LIPITOR) 40 MG tablet TAKE 1 TABLET BY MOUTH EVERY DAY 30 tablet 5  . citalopram (CELEXA) 20 MG tablet TAKE 1 TABLET BY MOUTH DAILY 30 tablet 5  . Cranberry 500 MG CAPS Take 500 mg by mouth daily.    . furosemide (LASIX) 40 MG tablet Take 20 mg by mouth daily.   0  . levothyroxine (SYNTHROID, LEVOTHROID) 75 MCG tablet TAKE 1 TABLET BY MOUTH DAILY 30 tablet 2  . loratadine (CLARITIN) 10 MG tablet Take 10 mg by mouth daily.    . Multiple Vitamins-Minerals (CENTRUM SILVER PO) Take 1 tablet by mouth daily.    Marland Kitchen. trimethoprim (TRIMPEX) 100 MG tablet Take 100 mg by mouth daily.   5  . verapamil (CALAN-SR) 180 MG CR tablet TAKE 1 TABLET BY MOUTH EVERY EVENING AT BEDTIME 30 tablet 0   Current Facility-Administered Medications on  File Prior to Visit  Medication Dose Route Frequency Provider Last Rate Last Dose  . pneumococcal 13-valent conjugate vaccine (PREVNAR 13) injection 0.5 mL  0.5 mL Intramuscular Once Bradd CanaryStacey A Blyth, MD        BP 117/71 mmHg  Pulse 58  Temp(Src) 97.9 F (36.6 C) (Oral)  Wt 179 lb 9.6 oz (81.466 kg)  SpO2 96%        Objective:   Physical Exam  Constitutional: She is oriented to person, place, and time.  HENT:  Head: Normocephalic.  Mouth/Throat: No oropharyngeal exudate.  Tenderness in maxillary area with palpation, slightly less tenderness of frontal sinuses. TMs are blocked by Cerumen.  Eyes: Conjunctivae and EOM are normal. Pupils are equal, round, and reactive to light. Right eye exhibits no discharge. Left eye exhibits no discharge. No scleral icterus.  Neck: Normal range of motion. Neck supple. No thyromegaly present.  Cardiovascular: Normal rate, regular rhythm and normal heart sounds.  Exam reveals no gallop and no friction rub.   No murmur heard. Pulmonary/Chest: Effort normal and breath sounds normal. No respiratory distress. She has no wheezes. She has no rales. She exhibits no tenderness.  Lymphadenopathy:    She has no cervical adenopathy.  Neurological: She is alert and oriented to person, place, and time.  Skin: Skin is warm and dry. No rash noted.  Psychiatric: She has a normal mood and affect. Her behavior is normal. Judgment and thought content normal.       Assessment & Plan:  I have personally seen and examined patient along with Naomie Deanarrie Doss NP for training/orientation purposes. I agree with Alyssa Williamson' assessment and plan.

## 2014-01-09 NOTE — Patient Instructions (Signed)
Stay hydrated and take full 10 days of Augmentin as directed. Call us if it is worse or fails to improve Continue the Claritin 10 mg daily.  Safe Travels!

## 2014-01-23 ENCOUNTER — Other Ambulatory Visit: Payer: Self-pay | Admitting: Family Medicine

## 2014-01-23 ENCOUNTER — Telehealth: Payer: Self-pay | Admitting: *Deleted

## 2014-01-23 NOTE — Telephone Encounter (Signed)
rx refill - xanax 0.25mg   Last OV- 10/30/13 Last refilled- 12/21/13 #35 / 0 rf  UDS-none

## 2014-01-23 NOTE — Telephone Encounter (Signed)
OK to refill the xanax with same strength, same sig #35. No rf

## 2014-01-24 MED ORDER — ALPRAZOLAM 0.25 MG PO TABS: 0.2500 mg | ORAL_TABLET | Freq: Two times a day (BID) | ORAL | Status: DC | PRN

## 2014-01-24 NOTE — Telephone Encounter (Signed)
rx printed / waiting for signature by provider. rx rdy for pick up Friday 12/04.  

## 2014-01-24 NOTE — Addendum Note (Signed)
Addended by: Eustace QuailEABOLD, Saharah Sherrow J on: 01/24/2014 09:27 AM   Modules accepted: Orders

## 2014-01-30 ENCOUNTER — Other Ambulatory Visit: Payer: Self-pay | Admitting: Family Medicine

## 2014-02-26 ENCOUNTER — Telehealth: Payer: Self-pay | Admitting: Family Medicine

## 2014-02-26 NOTE — Telephone Encounter (Signed)
Requesting:ALPRAZolam (XANAX) 0.25 MG tablet Contract not on file UDS not on file Last OV 01/09/2014 with Melissa (acute) Last Refill 01/24/14 #35 0 refills  Please Advise

## 2014-02-26 NOTE — Telephone Encounter (Signed)
Requesting refill on ALPRAZolam (XANAX) 0.25 MG tablet

## 2014-02-27 MED ORDER — ALPRAZOLAM 0.25 MG PO TABS: 0.2500 mg | ORAL_TABLET | Freq: Two times a day (BID) | ORAL | Status: DC | PRN

## 2014-02-27 NOTE — Telephone Encounter (Signed)
OK to Refill Alprazolam with same sig, #35

## 2014-02-27 NOTE — Telephone Encounter (Signed)
Rx printed and placed for signature To be faxed to [facility] [address]

## 2014-03-26 ENCOUNTER — Other Ambulatory Visit: Payer: Self-pay | Admitting: Family Medicine

## 2014-03-28 DIAGNOSIS — H6121 Impacted cerumen, right ear: Secondary | ICD-10-CM | POA: Diagnosis not present

## 2014-03-28 DIAGNOSIS — J029 Acute pharyngitis, unspecified: Secondary | ICD-10-CM | POA: Diagnosis not present

## 2014-04-01 ENCOUNTER — Telehealth: Payer: Self-pay | Admitting: Family Medicine

## 2014-04-01 NOTE — Telephone Encounter (Signed)
Caller name: Giana Relation to pt: self Call back number: [phone] Pharmacy: [facility] [address]  Reason for call:   Requesting alprazolam refill

## 2014-04-01 NOTE — Telephone Encounter (Signed)
Ok to refill Aprazolam with same strength, same sig, same number but no refills it is going on 6 months since we saw her sheshould schedule a follow up if she wants to stay on this medicine

## 2014-04-02 MED ORDER — ALPRAZOLAM 0.25 MG PO TABS: 0.2500 mg | ORAL_TABLET | Freq: Two times a day (BID) | ORAL | Status: DC | PRN

## 2014-04-02 NOTE — Telephone Encounter (Signed)
Printed prescription as instructed by PCP.  Called the patient left a detailed message [facility] [address], also informed of PCP instructions regarding further refills and need to schedule appt. asap.

## 2014-04-09 ENCOUNTER — Ambulatory Visit: Payer: Medicare Other | Admitting: Family Medicine

## 2014-04-29 ENCOUNTER — Telehealth: Payer: Self-pay | Admitting: Family Medicine

## 2014-04-29 NOTE — Telephone Encounter (Signed)
OK to write her a 2 week (14 day supply) of her Alprazolam, same sig, same strength til seen

## 2014-04-29 NOTE — Telephone Encounter (Signed)
Caller name:Betsaida Relation to pt: self Call back number:  [phone] Pharmacy: [facility] [address]  Reason for call:   Patient is requesting a refill of alprazolam. Just enough to last her until she is seen on 05/07/14

## 2014-04-30 MED ORDER — ALPRAZOLAM 0.25 MG PO TABS: 0.2500 mg | ORAL_TABLET | Freq: Two times a day (BID) | ORAL | Status: DC | PRN

## 2014-04-30 NOTE — Telephone Encounter (Signed)
Printed prescription as instructed by PCP and put on counter for signature

## 2014-04-30 NOTE — Telephone Encounter (Signed)
Fax[facility] hard[facility] and patient informed refill done.

## 2014-05-03 ENCOUNTER — Other Ambulatory Visit: Payer: Self-pay | Admitting: Family Medicine

## 2014-05-07 ENCOUNTER — Ambulatory Visit: Payer: Medicare Other | Admitting: Family Medicine

## 2014-05-09 ENCOUNTER — Ambulatory Visit (INDEPENDENT_AMBULATORY_CARE_PROVIDER_SITE_OTHER): Payer: Medicare Other | Admitting: Family Medicine

## 2014-05-09 ENCOUNTER — Encounter: Payer: Self-pay | Admitting: Family Medicine

## 2014-05-09 VITALS — BP 145/43 | HR 57 | Temp 97.7°F | Ht 62.0 in | Wt 178.8 lb

## 2014-05-09 DIAGNOSIS — E039 Hypothyroidism, unspecified: Secondary | ICD-10-CM

## 2014-05-09 DIAGNOSIS — E785 Hyperlipidemia, unspecified: Secondary | ICD-10-CM | POA: Diagnosis not present

## 2014-05-09 DIAGNOSIS — N390 Urinary tract infection, site not specified: Secondary | ICD-10-CM

## 2014-05-09 DIAGNOSIS — D649 Anemia, unspecified: Secondary | ICD-10-CM | POA: Diagnosis not present

## 2014-05-09 DIAGNOSIS — I10 Essential (primary) hypertension: Secondary | ICD-10-CM

## 2014-05-09 LAB — COMPREHENSIVE METABOLIC PANEL
ALBUMIN: 4 g/dL (ref 3.5–5.2)
ALT: 20 U/L (ref 0–35)
AST: 24 U/L (ref 0–37)
Alkaline Phosphatase: 62 U/L (ref 39–117)
BUN: 15 mg/dL (ref 6–23)
CHLORIDE: 100 meq/L (ref 96–112)
CO2: 31 mEq/L (ref 19–32)
CREATININE: 0.82 mg/dL (ref 0.40–1.20)
Calcium: 9.3 mg/dL (ref 8.4–10.5)
GFR: 71.09 mL/min (ref >60.00)
GLUCOSE: 91 mg/dL (ref 70–99)
Potassium: 4.4 mEq/L (ref 3.5–5.1)
Sodium: 135 mEq/L (ref 135–145)
Total Bilirubin: 0.8 mg/dL (ref 0.2–1.2)
Total Protein: 6.9 g/dL (ref 6.0–8.3)

## 2014-05-09 LAB — LIPID PANEL
Cholesterol: 145 mg/dL (ref 0–200)
HDL: 59.4 mg/dL (ref >39.00)
LDL CALC: 73 mg/dL (ref 0–99)
NonHDL: 85.6
Total CHOL/HDL Ratio: 2
Triglycerides: 64 mg/dL (ref 0.0–149.0)
VLDL: 12.8 mg/dL (ref 0.0–40.0)

## 2014-05-09 LAB — TSH: TSH: 1.1 u[IU]/mL (ref 0.35–4.50)

## 2014-05-09 MED ORDER — ALPRAZOLAM 0.25 MG PO TABS: 0.2500 mg | ORAL_TABLET | Freq: Two times a day (BID) | ORAL | Status: DC | PRN

## 2014-05-09 NOTE — Patient Instructions (Signed)
Melatonin 5 mg at bedtime  Insomnia Insomnia is frequent trouble falling and/or staying asleep. Insomnia can be a long term problem or a short term problem. Both are common. Insomnia can be a short term problem when the wakefulness is related to a certain stress or worry. Long term insomnia is often related to ongoing stress during waking hours and/or poor sleeping habits. Overtime, sleep deprivation itself can make the problem worse. Every little thing feels more severe because you are overtired and your ability to cope is decreased. CAUSES   Stress, anxiety, and depression.  Poor sleeping habits.  Distractions such as TV in the bedroom.  Naps close to bedtime.  Engaging in emotionally charged conversations before bed.  Technical reading before sleep.  Alcohol and other sedatives. They may make the problem worse. They can hurt normal sleep patterns and normal dream activity.  Stimulants such as caffeine for several hours prior to bedtime.  Pain syndromes and shortness of breath can cause insomnia.  Exercise late at night.  Changing time zones may cause sleeping problems (jet lag). It is sometimes helpful to have someone observe your sleeping patterns. They should look for periods of not breathing during the night (sleep apnea). They should also look to see how long those periods last. If you live alone or observers are uncertain, you can also be observed at a sleep clinic where your sleep patterns will be professionally monitored. Sleep apnea requires a checkup and treatment. Give your caregivers your medical history. Give your caregivers observations your family has made about your sleep.  SYMPTOMS   Not feeling rested in the morning.  Anxiety and restlessness at bedtime.  Difficulty falling and staying asleep. TREATMENT   Your caregiver may prescribe treatment for an underlying medical disorders. Your caregiver can give advice or help if you are using alcohol or other drugs  for self-medication. Treatment of underlying problems will usually eliminate insomnia problems.  Medications can be prescribed for short time use. They are generally not recommended for lengthy use.  Over-the-counter sleep medicines are not recommended for lengthy use. They can be habit forming.  You can promote easier sleeping by making lifestyle changes such as:  Using relaxation techniques that help with breathing and reduce muscle tension.  Exercising earlier in the day.  Changing your diet and the time of your last meal. No night time snacks.  Establish a regular time to go to bed.  Counseling can help with stressful problems and worry.  Soothing music and white noise may be helpful if there are background noises you cannot remove.  Stop tedious detailed work at least one hour before bedtime. HOME CARE INSTRUCTIONS   Keep a diary. Inform your caregiver about your progress. This includes any medication side effects. See your caregiver regularly. Take note of:  Times when you are asleep.  Times when you are awake during the night.  The quality of your sleep.  How you feel the next day. This information will help your caregiver care for you.  Get out of bed if you are still awake after 15 minutes. Read or do some quiet activity. Keep the lights down. Wait until you feel sleepy and go back to bed.  Keep regular sleeping and waking hours. Avoid naps.  Exercise regularly.  Avoid distractions at bedtime. Distractions include watching television or engaging in any intense or detailed activity like attempting to balance the household checkbook.  Develop a bedtime ritual. Keep a familiar routine of bathing, brushing your teeth,  climbing into bed at the same time each night, listening to soothing music. Routines increase the success of falling to sleep faster.  Use relaxation techniques. This can be using breathing and muscle tension release routines. It can also include  visualizing peaceful scenes. You can also help control troubling or intruding thoughts by keeping your mind occupied with boring or repetitive thoughts like the old concept of counting sheep. You can make it more creative like imagining planting one beautiful flower after another in your backyard garden.  During your day, work to eliminate stress. When this is not possible use some of the previous suggestions to help reduce the anxiety that accompanies stressful situations. MAKE SURE YOU:   Understand these instructions.  Will watch your condition.  Will get help right away if you are not doing well or get worse. Document Released: 02/06/2000 Document Revised: 05/03/2011 Document Reviewed: 03/08/2007 [facility] Patient Information 2015 [place], [place]. This information is not intended to replace advice given to you by your health care provider. Make sure you discuss any questions you have with your health care provider.

## 2014-05-09 NOTE — Progress Notes (Signed)
Pre visit review using our clinic review tool, if applicable. No additional management support is needed unless otherwise documented below in the visit note. 

## 2014-05-20 NOTE — Assessment & Plan Note (Signed)
Resolved with last blood draw 

## 2014-05-20 NOTE — Assessment & Plan Note (Signed)
On Levothyroxine, continue to monitor 

## 2014-05-20 NOTE — Progress Notes (Signed)
Alyssa Williamson  [id number] [birth year] 05/20/2014      Progress Note-Follow Up  Subjective  Chief Complaint  Chief Complaint  Patient presents with  . Dicuss medication    HPI  Patient is a 81 y.o. female in today for routine medical care. Patient is in today doing well. No recent illness. Is noting a great improvement in her urinary symptoms since she started on daily antibiotics. No dysuria or hematuria. Denies CP/palp/SOB/HA/congestion/fevers/GI or GU c/o. Taking meds as prescribed  Past Medical History  Diagnosis Date  . Diverticulitis   . Arthritis     bilateral knee  . Hypothyroid   . Allergic rhinitis   . Heart murmur   . Hyperlipidemia   . Hypertension   . PONV (postoperative nausea and vomiting)   . Anxiety   . Anemia   . Left knee DJD   . Medicare annual wellness visit, subsequent 10/30/2013  . Cervical cancer screening 10/30/2013    Menarche at 15 Regular and moderate flow No history of abnormal pap in past G4P3, s/p 3 svd and 1 MC No history of abnormal MGM Noconcerns today No gyn surgeries    Past Surgical History  Procedure Laterality Date  . Replacement total knee  10/2010    right knee-Murphy   . Bladder tack  04/2010    Dr Marciano Sequin  . Abdominal hysterectomy    . Tonsillectomy    . Joint replacement Right   . Ganglion cyst excision    . Total knee arthroplasty Left 07/26/2012    Procedure: TOTAL KNEE ARTHROPLASTY;  Surgeon: Loreta Ave, MD;  Location: [facility] OR;  Service: Orthopedics;  Laterality: Left;    Family History  Problem Relation Age of Onset  . Alcohol abuse Father   . Breast cancer Sister   . Cancer Sister     bladder  . Glaucoma Sister   . Arthritis Daughter   . Diabetes Daughter     type 2  . Cancer Daughter     brain cancer, diagnosed 29 years.agp  . Mental illness Daughter     s/p craniotomies, gamma knife for tumors. numerous shunts.  . Heart failure Mother   . Colon cancer Neg Hx   . Hypertension Neg Hx   . Cancer  Brother     glioblastoma   . Glaucoma Brother   . Diverticulosis Sister   . Cancer Sister     breast  . Cancer Brother     melanoma, mets to lung and brain and intestine  . Alcohol abuse Brother   . Cancer Brother     liver  . Glaucoma Maternal Grandfather     History   Social History  . Marital Status: Widowed    Spouse Name: N/A  . Number of Children: 3  . Years of Education: N/A   Occupational History  . Not on file.   Social History Main Topics  . Smoking status: Never Smoker   . Smokeless tobacco: Never Used  . Alcohol Use: Yes     Comment: rare  . Drug Use: No  . Sexual Activity: No     Comment: no dietary restrictions, lives alone   Other Topics Concern  . Not on file   Social History Narrative    Current Outpatient Prescriptions on File Prior to Visit  Medication Sig Dispense Refill  . atorvastatin (LIPITOR) 40 MG tablet TAKE 1 TABLET BY MOUTH EVERY DAY 30 tablet 0  . citalopram (CELEXA) 20 MG  tablet TAKE 1 TABLET BY MOUTH DAILY 30 tablet 5  . Cranberry 500 MG CAPS Take 500 mg by mouth daily.    . furosemide (LASIX) 40 MG tablet Take 20 mg by mouth daily.   0  . levothyroxine (SYNTHROID, LEVOTHROID) 75 MCG tablet TAKE 1 TABLET BY MOUTH DAILY 30 tablet 3  . loratadine (CLARITIN) 10 MG tablet Take 10 mg by mouth daily.    . Multiple Vitamins-Minerals (CENTRUM SILVER PO) Take 1 tablet by mouth daily.    Marland Kitchen. trimethoprim (TRIMPEX) 100 MG tablet Take 100 mg by mouth daily.   5  . verapamil (CALAN-SR) 180 MG CR tablet TAKE 1 TABLET BY MOUTH EVERY EVENING AT BEDTIME 30 tablet 6   Current Facility-Administered Medications on File Prior to Visit  Medication Dose Route Frequency Provider Last Rate Last Dose  . pneumococcal 13-valent conjugate vaccine (PREVNAR 13) injection 0.5 mL  0.5 mL Intramuscular Once Bradd CanaryStacey A Deadrian Toya, MD        Allergies  Allergen Reactions  . Codeine Nausea And Vomiting    Review of Systems  Review of Systems  Constitutional: Negative  for fever and malaise/fatigue.  HENT: Negative for congestion.   Eyes: Negative for discharge.  Respiratory: Negative for shortness of breath.   Cardiovascular: Negative for chest pain, palpitations and leg swelling.  Gastrointestinal: Negative for nausea, abdominal pain and diarrhea.  Genitourinary: Negative for dysuria.  Musculoskeletal: Negative for falls.  Skin: Negative for rash.  Neurological: Negative for loss of consciousness and headaches.  Endo/Heme/Allergies: Negative for polydipsia.  Psychiatric/Behavioral: Negative for depression and suicidal ideas. The patient is not nervous/anxious and does not have insomnia.     Objective  BP 145/43 mmHg  Pulse 57  Temp(Src) 97.7 F (36.5 C) (Oral)  Ht 5\' 2"  (1.575 m)  Wt 178 lb 12.8 oz (81.103 kg)  BMI 32.69 kg/m2  SpO2 98%  Physical Exam  Physical Exam  Constitutional: She is oriented to person, place, and time and well-developed, well-nourished, and in no distress. No distress.  HENT:  Head: Normocephalic and atraumatic.  Eyes: Conjunctivae are normal.  Neck: Neck supple. No thyromegaly present.  Cardiovascular: Normal rate, regular rhythm and normal heart sounds.   No murmur heard. Pulmonary/Chest: Effort normal and breath sounds normal. She has no wheezes.  Abdominal: She exhibits no distension and no mass.  Musculoskeletal: She exhibits no edema.  Lymphadenopathy:    She has no cervical adenopathy.  Neurological: She is alert and oriented to person, place, and time.  Skin: Skin is warm and dry. No rash noted. She is not diaphoretic.  Psychiatric: Memory, affect and judgment normal.    Lab Results  Component Value Date   TSH 1.10 05/09/2014   Lab Results  Component Value Date   WBC 5.6 10/25/2013   HGB 13.2 10/25/2013   HCT 40.5 10/25/2013   MCV 94.8 10/25/2013   PLT 193.0 10/25/2013   Lab Results  Component Value Date   CREATININE 0.82 05/09/2014   BUN 15 05/09/2014   NA 135 05/09/2014   K 4.4  05/09/2014   CL 100 05/09/2014   CO2 31 05/09/2014   Lab Results  Component Value Date   ALT 20 05/09/2014   AST 24 05/09/2014   ALKPHOS 62 05/09/2014   BILITOT 0.8 05/09/2014   Lab Results  Component Value Date   CHOL 145 05/09/2014   Lab Results  Component Value Date   HDL 59.40 05/09/2014   Lab Results  Component Value Date  LDLCALC 73 05/09/2014   Lab Results  Component Value Date   TRIG 64.0 05/09/2014   Lab Results  Component Value Date   CHOLHDL 2 05/09/2014     Assessment & Plan  HTN (hypertension) Patient reports good control at home. No changes today.    Hypothyroidism On Levothyroxine, continue to monitor   Anemia Resolved with last blood draw   Hyperlipidemia Tolerating statin, encouraged heart healthy diet, avoid trans fats, minimize simple carbs and saturated fats. Increase exercise as tolerated   Recurrent UTI Good response to prophylactic antibiotics. Continue the same

## 2014-05-20 NOTE — Assessment & Plan Note (Signed)
Tolerating statin, encouraged heart healthy diet, avoid trans fats, minimize simple carbs and saturated fats. Increase exercise as tolerated 

## 2014-05-20 NOTE — Assessment & Plan Note (Signed)
Patient reports good control at home. No changes today.

## 2014-05-20 NOTE — Assessment & Plan Note (Signed)
Good response to prophylactic antibiotics. Continue the same

## 2014-06-03 ENCOUNTER — Other Ambulatory Visit: Payer: Self-pay | Admitting: Family Medicine

## 2014-06-13 ENCOUNTER — Other Ambulatory Visit: Payer: Self-pay | Admitting: Family Medicine

## 2014-07-05 ENCOUNTER — Ambulatory Visit (INDEPENDENT_AMBULATORY_CARE_PROVIDER_SITE_OTHER): Payer: Medicare Other | Admitting: Medical

## 2014-07-05 ENCOUNTER — Encounter: Payer: Self-pay | Admitting: Medical

## 2014-07-05 VITALS — BP 130/70 | HR 75 | Temp 97.8°F | Ht 62.0 in | Wt 177.0 lb

## 2014-07-05 DIAGNOSIS — R062 Wheezing: Secondary | ICD-10-CM | POA: Insufficient documentation

## 2014-07-05 DIAGNOSIS — J0100 Acute maxillary sinusitis, unspecified: Secondary | ICD-10-CM

## 2014-07-05 DIAGNOSIS — J301 Allergic rhinitis due to pollen: Secondary | ICD-10-CM | POA: Diagnosis not present

## 2014-07-05 DIAGNOSIS — J309 Allergic rhinitis, unspecified: Secondary | ICD-10-CM | POA: Insufficient documentation

## 2014-07-05 MED ORDER — FLUTICASONE PROPIONATE 50 MCG/ACT NA SUSP: 2.0000 | Freq: Every day | NASAL | Status: DC

## 2014-07-05 MED ORDER — CEPHALEXIN 500 MG PO CAPS: 500.0000 mg | ORAL_CAPSULE | Freq: Two times a day (BID) | ORAL | Status: DC

## 2014-07-05 MED ORDER — BENZONATATE 100 MG PO CAPS: 100.0000 mg | ORAL_CAPSULE | Freq: Two times a day (BID) | ORAL | Status: DC | PRN

## 2014-07-05 MED ORDER — ALBUTEROL SULFATE HFA 108 (90 BASE) MCG/ACT IN AERS: 2.0000 | INHALATION_SPRAY | Freq: Four times a day (QID) | RESPIRATORY_TRACT | Status: DC | PRN

## 2014-07-05 NOTE — Progress Notes (Signed)
Pre visit review using our clinic review tool, if applicable. No additional management support is needed unless otherwise documented below in the visit note. 

## 2014-07-05 NOTE — Assessment & Plan Note (Signed)
Albuterol inhaler

## 2014-07-05 NOTE — Patient Instructions (Addendum)
Allergic rhinitis rx flonase. Continue claritin.   Acute maxillary sinusitis Possible but early. Rx cephalexin. Start if symptoms worsen as discussed   Wheezing Albuterol inhaler.    Benzonatate for cough Follow up 7 days or as needed

## 2014-07-05 NOTE — Assessment & Plan Note (Addendum)
Possible but early. Rx cephalexin. Start if symptoms worsen as discussed

## 2014-07-05 NOTE — Progress Notes (Signed)
Subjective:    Patient ID: Alyssa Williamson, female    DOB: 1931/09/24, 81 y.o.   MRN: [id number]  HPI  Pt in with report some allergy flares twice this past spring. Pt states some nasal congestion, some pnd and dry cough recently. Some wheezing last couple of days. No hx of asthma. She is not a smoker. Some chills little yesterday and day before.  Pt is only on claritin.   Review of Systems  Constitutional: Positive for chills. Negative for fever and fatigue.  HENT: Positive for congestion, postnasal drip and sneezing. Negative for sinus pressure and sore throat.   Respiratory: Positive for cough and wheezing. Negative for choking, chest tightness and shortness of breath.   Cardiovascular: Negative for chest pain and palpitations.  Musculoskeletal: Negative for back pain.  Neurological: Negative for dizziness, weakness, light-headedness, numbness and headaches.  Hematological: Negative for adenopathy. Does not bruise/bleed easily.  Psychiatric/Behavioral: Negative for behavioral problems and confusion.    Past Medical History  Diagnosis Date  . Diverticulitis   . Arthritis     bilateral knee  . Hypothyroid   . Allergic rhinitis   . Heart murmur   . Hyperlipidemia   . Hypertension   . PONV (postoperative nausea and vomiting)   . Anxiety   . Anemia   . Left knee DJD   . Medicare annual wellness visit, subsequent 10/30/2013  . Cervical cancer screening 10/30/2013    Menarche at 15 Regular and moderate flow No history of abnormal pap in past G4P3, s/p 3 svd and 1 MC No history of abnormal MGM Noconcerns today No gyn surgeries    History   Social History  . Marital Status: Widowed    Spouse Name: N/A  . Number of Children: 3  . Years of Education: N/A   Occupational History  . Not on file.   Social History Main Topics  . Smoking status: Never Smoker   . Smokeless tobacco: Never Used  . Alcohol Use: Yes     Comment: rare  . Drug Use: No  . Sexual Activity: No   Comment: no dietary restrictions, lives alone   Other Topics Concern  . Not on file   Social History Narrative    Past Surgical History  Procedure Laterality Date  . Replacement total knee  10/2010    right knee-Murphy   . Bladder tack  04/2010    Dr Marciano Sequinimothy  [place]  . Abdominal hysterectomy    . Tonsillectomy    . Joint replacement Right   . Ganglion cyst excision    . Total knee arthroplasty Left 07/26/2012    Procedure: TOTAL KNEE ARTHROPLASTY;  Surgeon: Loreta Aveaniel F Murphy, MD;  Location: [facility] OR;  Service: Orthopedics;  Laterality: Left;    Family History  Problem Relation Age of Onset  . Alcohol abuse Father   . Breast cancer Sister   . Cancer Sister     bladder  . Glaucoma Sister   . Arthritis Daughter   . Diabetes Daughter     type 2  . Cancer Daughter     brain cancer, diagnosed 29 years.agp  . Mental illness Daughter     s/p craniotomies, gamma knife for tumors. numerous shunts.  . Heart failure Mother   . Colon cancer Neg Hx   . Hypertension Neg Hx   . Cancer Brother     glioblastoma   . Glaucoma Brother   . Diverticulosis Sister   . Cancer Sister  breast  . Cancer Brother     melanoma, mets to lung and brain and intestine  . Alcohol abuse Brother   . Cancer Brother     liver  . Glaucoma Maternal Grandfather     Allergies  Allergen Reactions  . Codeine Nausea And Vomiting    Current Outpatient Prescriptions on File Prior to Visit  Medication Sig Dispense Refill  . ALPRAZolam (XANAX) 0.25 MG tablet Take 1 tablet (0.25 mg total) by mouth 2 (two) times daily as needed for anxiety or sleep. 60 tablet 2  . atorvastatin (LIPITOR) 40 MG tablet TAKE 1 TABLET BY MOUTH EVERY DAY 30 tablet 2  . citalopram (CELEXA) 20 MG tablet TAKE 1 TABLET BY MOUTH DAILY 30 tablet 6  . Cranberry 500 MG CAPS Take 500 mg by mouth daily.    . furosemide (LASIX) 40 MG tablet Take 20 mg by mouth daily.   0  . levothyroxine (SYNTHROID, LEVOTHROID) 75 MCG tablet TAKE 1 TABLET BY  MOUTH DAILY 30 tablet 3  . loratadine (CLARITIN) 10 MG tablet Take 10 mg by mouth daily.    . Multiple Vitamins-Minerals (CENTRUM SILVER PO) Take 1 tablet by mouth daily.    Marland Kitchen. trimethoprim (TRIMPEX) 100 MG tablet Take 100 mg by mouth daily.   5  . verapamil (CALAN-SR) 180 MG CR tablet TAKE 1 TABLET BY MOUTH EVERY EVENING AT BEDTIME 30 tablet 6   Current Facility-Administered Medications on File Prior to Visit  Medication Dose Route Frequency Provider Last Rate Last Dose  . pneumococcal 13-valent conjugate vaccine (PREVNAR 13) injection 0.5 mL  0.5 mL Intramuscular Once Bradd CanaryStacey A Blyth, MD        BP 168/76 mmHg  Pulse 75  Temp(Src) 97.8 F (36.6 C) (Oral)  Ht 5\' 2"  (1.575 m)  Wt 177 lb (80.287 kg)  BMI 32.37 kg/m2  SpO2 92%       Objective:   Physical Exam   General  Mental Status - Alert. General Appearance - Well groomed. Not in acute distress.  Skin Rashes- No Rashes.  HEENT Head- Normal. Ear Auditory Canal - Left- Normal. Right - Normal.Tympanic Membrane- Left- Normal. Right- Normal. Eye Sclera/Conjunctiva- Left- Normal. Right- Normal. Nose & Sinuses Nasal Mucosa- Left-  Boggy and Congested. Right-  Boggy and  Congested.Bilateral maxillary sinus pressure but no  frontal sinus pressure. Mouth & Throat Lips: Upper Lip- Normal: no dryness, cracking, pallor, cyanosis, or vesicular eruption. Lower Lip-Normal: no dryness, cracking, pallor, cyanosis or vesicular eruption. Buccal Mucosa- Bilateral- No Aphthous ulcers. Oropharynx- No Discharge or Erythema. +pnd Tonsils: Characteristics- Bilateral- No Erythema or Congestion. Size/Enlargement- Bilateral- No enlargement. Discharge- bilateral-None.  Neck Neck- Supple. No Masses.   Chest and Lung Exam Auscultation: Breath Sounds:-Clear even and unlabored.  Cardiovascular Auscultation:Rythm- Regular, rate and rhythm. Murmurs & Other Heart Sounds:Ausculatation of the heart reveal- No Murmurs.  Lymphatic Head &  Neck General Head & Neck Lymphatics: Bilateral: Description- No Localized lymphadenopathy.      Assessment & Plan:

## 2014-07-05 NOTE — Assessment & Plan Note (Signed)
rx flonase. Continue claritin.

## 2014-07-23 ENCOUNTER — Telehealth: Payer: Self-pay | Admitting: Family Medicine

## 2014-07-23 DIAGNOSIS — R059 Cough, unspecified: Secondary | ICD-10-CM

## 2014-07-23 DIAGNOSIS — R918 Other nonspecific abnormal finding of lung field: Secondary | ICD-10-CM

## 2014-07-23 DIAGNOSIS — R05 Cough: Secondary | ICD-10-CM

## 2014-07-23 NOTE — Telephone Encounter (Signed)
I saw pt 2 wks ago or more. She needs office visit. I did go ahead and put cxr order in. Since that will need to be done.

## 2014-07-23 NOTE — Telephone Encounter (Signed)
Caller name: Prima Relation to pt: self Call back number: [phone] Pharmacy:  Reason for call:   Patient states that she saw Ramon Dredgedward last week and is not better at all. She is still coughing and is requesting an xray.

## 2014-07-24 ENCOUNTER — Ambulatory Visit (HOSPITAL_BASED_OUTPATIENT_CLINIC_OR_DEPARTMENT_OTHER)
Admission: RE | Admit: 2014-07-24 | Discharge: 2014-07-24 | Disposition: A | Discharge status: Home / Self Care | Payer: Medicare Other | Source: Ambulatory Visit | Attending: Medical | Admitting: Medical

## 2014-07-24 ENCOUNTER — Ambulatory Visit (INDEPENDENT_AMBULATORY_CARE_PROVIDER_SITE_OTHER): Payer: Medicare Other | Admitting: Medical

## 2014-07-24 ENCOUNTER — Encounter: Payer: Self-pay | Admitting: Medical

## 2014-07-24 VITALS — BP 140/75 | HR 62 | Temp 97.8°F | Ht 62.0 in | Wt 174.4 lb

## 2014-07-24 DIAGNOSIS — J209 Acute bronchitis, unspecified: Secondary | ICD-10-CM

## 2014-07-24 DIAGNOSIS — R05 Cough: Secondary | ICD-10-CM | POA: Diagnosis present

## 2014-07-24 DIAGNOSIS — Z8639 Personal history of other endocrine, nutritional and metabolic disease: Secondary | ICD-10-CM

## 2014-07-24 DIAGNOSIS — R062 Wheezing: Secondary | ICD-10-CM

## 2014-07-24 DIAGNOSIS — R918 Other nonspecific abnormal finding of lung field: Secondary | ICD-10-CM | POA: Insufficient documentation

## 2014-07-24 MED ORDER — METHYLPREDNISOLONE ACETATE 40 MG/ML IJ SUSP
40.0000 mg | Freq: Once | INTRAMUSCULAR | Status: AC
  Administered 2014-07-24: 40 mg via INTRAMUSCULAR

## 2014-07-24 MED ORDER — BENZONATATE 100 MG PO CAPS: 100.0000 mg | ORAL_CAPSULE | Freq: Three times a day (TID) | ORAL | Status: DC | PRN

## 2014-07-24 MED ORDER — BECLOMETHASONE DIPROPIONATE 40 MCG/ACT IN AERS: 2.0000 | INHALATION_SPRAY | Freq: Two times a day (BID) | RESPIRATORY_TRACT | Status: DC

## 2014-07-24 MED ORDER — DOXYCYCLINE HYCLATE 100 MG PO TABS: 100.0000 mg | ORAL_TABLET | Freq: Two times a day (BID) | ORAL | Status: DC

## 2014-07-24 MED ORDER — CEFTRIAXONE SODIUM 1 G IJ SOLR
1.0000 g | Freq: Once | INTRAMUSCULAR | Status: AC
  Administered 2014-07-24: 1 g via INTRAMUSCULAR

## 2014-07-24 NOTE — Progress Notes (Signed)
Pre visit review using our clinic review tool, if applicable. No additional management support is needed unless otherwise documented below in the visit note. 

## 2014-07-24 NOTE — Telephone Encounter (Signed)
Cxr post antibiotic.

## 2014-07-24 NOTE — Assessment & Plan Note (Addendum)
Since she states no better for 2 wks. Will treat more on aggressive side. Rx rocephin im today. Rx of doxycycline as well. Get cxr today. Rx benzonatate for cough.

## 2014-07-24 NOTE — Progress Notes (Signed)
Subjective:    Patient ID: Alyssa Williamson, female    DOB: [birth year], 81 y.o.   MRN: [id number]  HPI   Pt in states she has pnd and has some productive cough. Some wheezing all day but mostly with her cough. Not worse with walking. Pt not diabetic.  Pt states her throat on rt side feels sensitive. Describes severe tickle sensation to neck at times. She states really never got better even though did take cephalexin.    The above occurred despite pt taking the cephalexin.    Pt in past states she had 2 thyroid nodules. She states no repeat US for years. Pt states in past one point was getting US thyroid every 6 months. Normal tsh in past no neck symptoms.      Review of Systems  Constitutional: Negative for fever, chills and fatigue.  HENT: Positive for postnasal drip. Negative for congestion, ear discharge, facial swelling, mouth sores, nosebleeds, sinus pressure and sore throat.   Respiratory: Positive for cough and wheezing. Negative for apnea, chest tightness and shortness of breath.   Cardiovascular: Negative for chest pain and palpitations.  Gastrointestinal: Negative.   Musculoskeletal: Negative for back pain.       Hx of thryoid nodule.  Neurological: Negative for dizziness, weakness and headaches.  Hematological: Negative for adenopathy. Does not bruise/bleed easily.  Psychiatric/Behavioral: Negative for behavioral problems and confusion.     Past Medical History  Diagnosis Date  . Diverticulitis   . Arthritis     bilateral knee  . Hypothyroid   . Allergic rhinitis   . Heart murmur   . Hyperlipidemia   . Hypertension   . PONV (postoperative nausea and vomiting)   . Anxiety   . Anemia   . Left knee DJD   . Medicare annual wellness visit, subsequent 10/30/2013  . Cervical cancer screening 10/30/2013    Menarche at 15 Regular and moderate flow No history of abnormal pap in past G4P3, s/p 3 svd and 1 MC No history of abnormal MGM Noconcerns today No gyn surgeries     History   Social History  . Marital Status: Widowed    Spouse Name: N/A  . Number of Children: 3  . Years of Education: N/A   Occupational History  . Not on file.   Social History Main Topics  . Smoking status: Never Smoker   . Smokeless tobacco: Never Used  . Alcohol Use: Yes     Comment: rare  . Drug Use: No  . Sexual Activity: No     Comment: no dietary restrictions, lives alone   Other Topics Concern  . Not on file   Social History Narrative    Past Surgical History  Procedure Laterality Date  . Replacement total knee  10/2010    right knee-Murphy   . Bladder tack  04/2010    Dr Marciano Sequinimothy  [place]  . Abdominal hysterectomy    . Tonsillectomy    . Joint replacement Right   . Ganglion cyst excision    . Total knee arthroplasty Left 07/26/2012    Procedure: TOTAL KNEE ARTHROPLASTY;  Surgeon: Loreta Aveaniel F Murphy, MD;  [facility];  Service: Orthopedics;  Laterality: Left;    Family History  Problem Relation Age of Onset  . Alcohol abuse Father   . Breast cancer Sister   . Cancer Sister     bladder  . Glaucoma Sister   . Arthritis Daughter   . Diabetes Daughter  type 2  . Cancer Daughter     brain cancer, diagnosed 29 years.agp  . Mental illness Daughter     s/p craniotomies, gamma knife for tumors. numerous shunts.  . Heart failure Mother   . Colon cancer Neg Hx   . Hypertension Neg Hx   . Cancer Brother     glioblastoma   . Glaucoma Brother   . Diverticulosis Sister   . Cancer Sister     breast  . Cancer Brother     melanoma, mets to lung and brain and intestine  . Alcohol abuse Brother   . Cancer Brother     liver  . Glaucoma Maternal Grandfather     Allergies  Allergen Reactions  . Codeine Nausea And Vomiting    Current Outpatient Prescriptions on File Prior to Visit  Medication Sig Dispense Refill  . albuterol (PROVENTIL HFA;VENTOLIN HFA) 108 (90 BASE) MCG/ACT inhaler Inhale 2 puffs into the lungs every 6 (six) hours as needed for  wheezing or shortness of breath. 1 Inhaler 0  . ALPRAZolam (XANAX) 0.25 MG tablet Take 1 tablet (0.25 mg total) by mouth 2 (two) times daily as needed for anxiety or sleep. 60 tablet 2  . atorvastatin (LIPITOR) 40 MG tablet TAKE 1 TABLET BY MOUTH EVERY DAY 30 tablet 2  . citalopram (CELEXA) 20 MG tablet TAKE 1 TABLET BY MOUTH DAILY 30 tablet 6  . Cranberry 500 MG CAPS Take 500 mg by mouth daily.    . fluticasone (FLONASE) 50 MCG/ACT nasal spray Place 2 sprays into both nostrils daily. 16 g 1  . loratadine (CLARITIN) 10 MG tablet Take 10 mg by mouth daily.    . Multiple Vitamins-Minerals (CENTRUM SILVER PO) Take 1 tablet by mouth daily.    Marland Kitchen trimethoprim (TRIMPEX) 100 MG tablet Take 100 mg by mouth daily.   5  . verapamil (CALAN-SR) 180 MG CR tablet TAKE 1 TABLET BY MOUTH EVERY EVENING AT BEDTIME 30 tablet 6  . furosemide (LASIX) 40 MG tablet Take 20 mg by mouth daily.   0  . levothyroxine (SYNTHROID, LEVOTHROID) 75 MCG tablet TAKE 1 TABLET BY MOUTH DAILY 30 tablet 3   Current Facility-Administered Medications on File Prior to Visit  Medication Dose Route Frequency Provider Last Rate Last Dose  . pneumococcal 13-valent conjugate vaccine (PREVNAR 13) injection 0.5 mL  0.5 mL Intramuscular Once Bradd Canary, MD        BP 140/75 mmHg  Pulse 62  Temp(Src) 97.8 F (36.6 C) (Oral)  Ht  (1.575 m)  Wt 174 lb 6.4 oz (79.107 kg)  BMI 31.89 kg/m2  SpO2 95%        Objective:   Physical Exam  General  Mental Status - Alert. General Appearance - Well groomed. Not in acute distress.  Skin Rashes- No Rashes.  HEENT Head- Normal. Ear Auditory Canal - Left- Normal. Right - Normal.Tympanic Membrane- Left- Normal. Right- Normal. Eye Sclera/Conjunctiva- Left- Normal. Right- Normal. Nose & Sinuses Nasal Mucosa- Left-  Boggy and Congested. Right-  Boggy and  Congested.Bilateral no  maxillary and  No frontal sinus pressure. Mouth & Throat Lips: Upper Lip- Normal: no dryness, cracking,  pallor, cyanosis, or vesicular eruption. Lower Lip-Normal: no dryness, cracking, pallor, cyanosis or vesicular eruption. Buccal Mucosa- Bilateral- No Aphthous ulcers. Oropharynx- No Discharge or Erythema. +pnd. Tonsils: Characteristics- Bilateral- No Erythema or Congestion. Size/Enlargement- Bilateral- No enlargement. Discharge- bilateral-None.  Neck Neck- Supple. No Masses.   Chest and Lung Exam Auscultation: Breath Sounds:-Clear  even and unlabored. Only mild shallow respirations. Cardiovascular Auscultation:Rythm- Regular, rate and rhythm. Murmurs & Other Heart Sounds:Ausculatation of the heart reveal- No Murmurs.  Lymphatic Head & Neck General Head & Neck Lymphatics: Bilateral: Description- No Localized lymphadenopathy.       Assessment & Plan:

## 2014-07-24 NOTE — Patient Instructions (Signed)
Acute bronchitis Since she states no better for 2 wks. Will treat more on aggressive side. Rx rocephin im today. Rx of doxycycline as well. Get cxr today. Rx benzonatate for cough.   Wheezing Some wheezing recenlty that perists for 2 wks.  Depomedrol 40 mg im today. Continue albuterol as needed.   History of thyroid nodule Will order thryoid us.     Follow up 7-10 days or as needed

## 2014-07-24 NOTE — Assessment & Plan Note (Addendum)
Some wheezing recenlty that perists for 2 wks.  Depomedrol 40 mg im today. Continue albuterol as needed.  qvar rx if wheezing despite the above.

## 2014-07-24 NOTE — Telephone Encounter (Signed)
Patient coming in today at 2pm

## 2014-07-24 NOTE — Assessment & Plan Note (Signed)
Will order thryoid us.

## 2014-07-25 NOTE — Telephone Encounter (Signed)
Left message for patient regarding CXR order placed. Asked patient to return my call.

## 2014-07-30 NOTE — Telephone Encounter (Signed)
Patient came in for office visit.

## 2014-08-05 ENCOUNTER — Ambulatory Visit (HOSPITAL_BASED_OUTPATIENT_CLINIC_OR_DEPARTMENT_OTHER)
Admission: RE | Admit: 2014-08-05 | Discharge: 2014-08-05 | Disposition: A | Discharge status: Home / Self Care | Payer: Medicare Other | Source: Ambulatory Visit | Attending: Medical | Admitting: Medical

## 2014-08-05 DIAGNOSIS — R059 Cough, unspecified: Secondary | ICD-10-CM

## 2014-08-05 DIAGNOSIS — R05 Cough: Secondary | ICD-10-CM | POA: Insufficient documentation

## 2014-08-05 DIAGNOSIS — R918 Other nonspecific abnormal finding of lung field: Secondary | ICD-10-CM

## 2014-08-06 ENCOUNTER — Encounter: Payer: Self-pay | Admitting: Medical

## 2014-08-06 ENCOUNTER — Ambulatory Visit (INDEPENDENT_AMBULATORY_CARE_PROVIDER_SITE_OTHER): Payer: Medicare Other | Admitting: Medical

## 2014-08-06 VITALS — BP 148/71 | HR 71 | Temp 98.5°F | Ht 62.0 in | Wt 172.6 lb

## 2014-08-06 DIAGNOSIS — J209 Acute bronchitis, unspecified: Secondary | ICD-10-CM | POA: Diagnosis not present

## 2014-08-06 DIAGNOSIS — R062 Wheezing: Secondary | ICD-10-CM

## 2014-08-06 NOTE — Assessment & Plan Note (Signed)
Resolved

## 2014-08-06 NOTE — Patient Instructions (Signed)
Resolved bronchitis and wheezing. No further tx needed.  Repeat thyroid US in one yr.  Follow up with pcp as regularly scheduled.

## 2014-08-06 NOTE — Progress Notes (Signed)
Pre visit review using our clinic review tool, if applicable. No additional management support is needed unless otherwise documented below in the visit note. 

## 2014-08-06 NOTE — Progress Notes (Signed)
Subjective:    Patient ID: Alyssa Williamson, female    DOB: [birth year], 81 y.o.   MRN: [id number]  HPI  Pt in states she is a lot better. She states no longer wheezing. Pt much less of cough(now rare/residual. No fever, no chills and no body aches.  Good energy.  Pt got better with rocephin, solumedrol and doxycyline. She finished antibiotic.  Pt thyroid US was stable on review. Will follow up /repeat study in 1 yr. Pt has normal tsh. No indicators of hyperthyroidism.    Review of Systems  Constitutional: Negative for fever, chills, diaphoresis, activity change and fatigue.  Respiratory: Negative for cough, chest tightness and shortness of breath.        Faint residual cough.  Cardiovascular: Negative for chest pain, palpitations and leg swelling.  Gastrointestinal: Negative for nausea, vomiting and abdominal pain.  Musculoskeletal: Negative for neck pain and neck stiffness.  Neurological: Negative for dizziness, tremors, seizures, syncope, facial asymmetry, speech difficulty, weakness, light-headedness, numbness and headaches.  Psychiatric/Behavioral: Negative for behavioral problems, confusion and agitation. The patient is not nervous/anxious.      Past Medical History  Diagnosis Date  . Diverticulitis   . Arthritis     bilateral knee  . Hypothyroid   . Allergic rhinitis   . Heart murmur   . Hyperlipidemia   . Hypertension   . PONV (postoperative nausea and vomiting)   . Anxiety   . Anemia   . Left knee DJD   . Medicare annual wellness visit, subsequent 10/30/2013  . Cervical cancer screening 10/30/2013    Menarche at 15 Regular and moderate flow No history of abnormal pap in past G4P3, s/p 3 svd and 1 MC No history of abnormal MGM Noconcerns today No gyn surgeries    History   Social History  . Marital Status: Widowed    Spouse Name: N/A  . Number of Children: 3  . Years of Education: N/A   Occupational History  . Not on file.   Social History Main Topics  .  Smoking status: Never Smoker   . Smokeless tobacco: Never Used  . Alcohol Use: Yes     Comment: rare  . Drug Use: No  . Sexual Activity: No     Comment: no dietary restrictions, lives alone   Other Topics Concern  . Not on file   Social History Narrative    Past Surgical History  Procedure Laterality Date  . Replacement total knee  10/2010    right knee-Murphy   . Bladder tack  04/2010    Dr Marciano Sequin  . Abdominal hysterectomy    . Tonsillectomy    . Joint replacement Right   . Ganglion cyst excision    . Total knee arthroplasty Left 07/26/2012    Procedure: TOTAL KNEE ARTHROPLASTY;  Surgeon: Loreta Ave, MD;  Location: [facility] OR;  Service: Orthopedics;  Laterality: Left;    Family History  Problem Relation Age of Onset  . Alcohol abuse Father   . Breast cancer Sister   . Cancer Sister     bladder  . Glaucoma Sister   . Arthritis Daughter   . Diabetes Daughter     type 2  . Cancer Daughter     brain cancer, diagnosed 29 years.agp  . Mental illness Daughter     s/p craniotomies, gamma knife for tumors. numerous shunts.  . Heart failure Mother   . Colon cancer Neg Hx   . Hypertension Neg Hx   .  Cancer Brother     glioblastoma   . Glaucoma Brother   . Diverticulosis Sister   . Cancer Sister     breast  . Cancer Brother     melanoma, mets to lung and brain and intestine  . Alcohol abuse Brother   . Cancer Brother     liver  . Glaucoma Maternal Grandfather     Allergies  Allergen Reactions  . Codeine Nausea And Vomiting    Current Outpatient Prescriptions on File Prior to Visit  Medication Sig Dispense Refill  . albuterol (PROVENTIL HFA;VENTOLIN HFA) 108 (90 BASE) MCG/ACT inhaler Inhale 2 puffs into the lungs every 6 (six) hours as needed for wheezing or shortness of breath. 1 Inhaler 0  . ALPRAZolam (XANAX) 0.25 MG tablet Take 1 tablet (0.25 mg total) by mouth 2 (two) times daily as needed for anxiety or sleep. 60 tablet 2  . atorvastatin (LIPITOR) 40  MG tablet TAKE 1 TABLET BY MOUTH EVERY DAY 30 tablet 2  . beclomethasone (QVAR) 40 MCG/ACT inhaler Inhale 2 puffs into the lungs 2 (two) times daily. 1 Inhaler 1  . benzonatate (TESSALON) 100 MG capsule Take 1 capsule (100 mg total) by mouth 3 (three) times daily as needed. 21 capsule 0  . citalopram (CELEXA) 20 MG tablet TAKE 1 TABLET BY MOUTH DAILY 30 tablet 6  . Cranberry 500 MG CAPS Take 500 mg by mouth daily.    Marland Kitchen doxycycline (VIBRA-TABS) 100 MG tablet Take 1 tablet (100 mg total) by mouth 2 (two) times daily. 14 tablet 0  . fluticasone (FLONASE) 50 MCG/ACT nasal spray Place 2 sprays into both nostrils daily. 16 g 1  . furosemide (LASIX) 40 MG tablet Take 20 mg by mouth daily.   0  . levothyroxine (SYNTHROID, LEVOTHROID) 75 MCG tablet TAKE 1 TABLET BY MOUTH DAILY 30 tablet 3  . loratadine (CLARITIN) 10 MG tablet Take 10 mg by mouth daily.    . Multiple Vitamins-Minerals (CENTRUM SILVER PO) Take 1 tablet by mouth daily.    Marland Kitchen trimethoprim (TRIMPEX) 100 MG tablet Take 100 mg by mouth daily.   5  . verapamil (CALAN-SR) 180 MG CR tablet TAKE 1 TABLET BY MOUTH EVERY EVENING AT BEDTIME 30 tablet 6   Current Facility-Administered Medications on File Prior to Visit  Medication Dose Route Frequency Provider Last Rate Last Dose  . pneumococcal 13-valent conjugate vaccine (PREVNAR 13) injection 0.5 mL  0.5 mL Intramuscular Once Bradd Canary, MD        BP 148/71 mmHg  Pulse 71  Temp(Src) 98.5 F (36.9 C) (Oral)  Ht  (1.575 m)  Wt 172 lb 9.6 oz (78.291 kg)  BMI 31.56 kg/m2  SpO2 91%       Objective:   Physical Exam  General  Mental Status - Alert. General Appearance - Well groomed. Not in acute distress.  Skin Rashes- No Rashes.  HEENT Head- Normal. Ear Auditory Canal - Left- Normal. Right - Normal.Tympanic Membrane- Left- Normal. Right- Normal. Eye Sclera/Conjunctiva- Left- Normal. Right- Normal. Nose & Sinuses Nasal Mucosa- Left-  Not boggy or Congested. Right-  Not   boggy or Congested. Mouth & Throat Lips: Upper Lip- Normal: no dryness, cracking, pallor, cyanosis, or vesicular eruption. Lower Lip-Normal: no dryness, cracking, pallor, cyanosis or vesicular eruption. Buccal Mucosa- Bilateral- No Aphthous ulcers. Oropharynx- No Discharge or Erythema. Tonsils: Characteristics- Bilateral- No Erythema or Congestion. Size/Enlargement- Bilateral- No enlargement. Discharge- bilateral-None.  Neck Neck- Supple. No Masses. No thyromegaly.   Chest  and Lung Exam Auscultation: Breath Sounds:- even and unlabored,   Cardiovascular Auscultation:Rythm- Regular, rate and rhythm. Murmurs & Other Heart Sounds:Ausculatation of the heart reveal- No Murmurs.  Lymphatic Head & NeckGeneral Head & Neck Lymphatics: Bilateral: Description- No Localized lymphadenopathy.       Assessment & Plan:  Resolved bronchitis and wheezing. No further tx needed.  Repeat thyroid US in one yr.  Follow up with pcp as regularly scheduled.  Note when I checked her 02 sat was 97%.

## 2014-08-06 NOTE — Assessment & Plan Note (Signed)
resolved 

## 2014-08-10 ENCOUNTER — Other Ambulatory Visit: Payer: Self-pay | Admitting: Family Medicine

## 2014-09-05 ENCOUNTER — Telehealth: Payer: Self-pay | Admitting: Family Medicine

## 2014-09-05 MED ORDER — ATORVASTATIN CALCIUM 40 MG PO TABS: 40.0000 mg | ORAL_TABLET | Freq: Every day | ORAL | Status: DC

## 2014-09-05 MED ORDER — VERAPAMIL HCL ER 180 MG PO TBCR: EXTENDED_RELEASE_TABLET | ORAL | Status: DC

## 2014-09-05 NOTE — Telephone Encounter (Signed)
Caller name: [place] Relation to pt: Call back number:[phone]  Pharmacy: [facility] on [address]  Reason for call:   Patient is requesting atorvastatin and verapamil

## 2014-09-05 NOTE — Telephone Encounter (Signed)
Sent in prescriptions as requested. 

## 2014-09-16 ENCOUNTER — Other Ambulatory Visit: Payer: Self-pay | Admitting: Family Medicine

## 2014-09-21 ENCOUNTER — Other Ambulatory Visit: Payer: Self-pay | Admitting: Family Medicine

## 2014-09-22 NOTE — Telephone Encounter (Signed)
OK to refill the Alprazolam with same strength, same sig, disp #60 no rf

## 2014-09-23 ENCOUNTER — Other Ambulatory Visit: Payer: Self-pay | Admitting: Family Medicine

## 2014-09-23 DIAGNOSIS — E785 Hyperlipidemia, unspecified: Secondary | ICD-10-CM

## 2014-09-23 DIAGNOSIS — I10 Essential (primary) hypertension: Secondary | ICD-10-CM

## 2014-09-23 NOTE — Telephone Encounter (Signed)
Requesting: Alprazolam 0.25 mg Contract signed on 05/09/14 UDS done 05/09/14 Low Risk Last OV   05/09/14 Last Refill   #60 with 2 refills 05/09/14  Please Advise

## 2014-09-23 NOTE — Telephone Encounter (Signed)
OK to refill Alprazolam with same sig, same strength, same number or 2 rf

## 2014-09-24 MED ORDER — ALPRAZOLAM 0.25 MG PO TABS: 0.2500 mg | ORAL_TABLET | Freq: Two times a day (BID) | ORAL | Status: DC | PRN

## 2014-09-24 NOTE — Telephone Encounter (Signed)
Printed and on counter for signature from PCP 

## 2014-09-24 NOTE — Telephone Encounter (Signed)
Once signed will fax hardcopy to [facility] [address] [place]

## 2014-10-22 ENCOUNTER — Other Ambulatory Visit: Payer: Self-pay | Admitting: Family Medicine

## 2014-10-22 MED ORDER — LEVOTHYROXINE SODIUM 75 MCG PO TABS: 75.0000 ug | ORAL_TABLET | Freq: Every day | ORAL | Status: DC

## 2014-11-11 ENCOUNTER — Ambulatory Visit (INDEPENDENT_AMBULATORY_CARE_PROVIDER_SITE_OTHER): Payer: Medicare Other | Admitting: Family Medicine

## 2014-11-11 ENCOUNTER — Encounter: Payer: Self-pay | Admitting: Family Medicine

## 2014-11-11 VITALS — BP 138/66 | HR 65 | Temp 98.2°F | Ht 62.0 in | Wt 178.1 lb

## 2014-11-11 DIAGNOSIS — E039 Hypothyroidism, unspecified: Secondary | ICD-10-CM | POA: Diagnosis not present

## 2014-11-11 DIAGNOSIS — Z0000 Encounter for general adult medical examination without abnormal findings: Secondary | ICD-10-CM | POA: Diagnosis not present

## 2014-11-11 DIAGNOSIS — I10 Essential (primary) hypertension: Secondary | ICD-10-CM

## 2014-11-11 DIAGNOSIS — R739 Hyperglycemia, unspecified: Secondary | ICD-10-CM

## 2014-11-11 DIAGNOSIS — E785 Hyperlipidemia, unspecified: Secondary | ICD-10-CM | POA: Diagnosis not present

## 2014-11-11 DIAGNOSIS — R911 Solitary pulmonary nodule: Secondary | ICD-10-CM

## 2014-11-11 DIAGNOSIS — E871 Hypo-osmolality and hyponatremia: Secondary | ICD-10-CM

## 2014-11-11 DIAGNOSIS — D649 Anemia, unspecified: Secondary | ICD-10-CM

## 2014-11-11 DIAGNOSIS — J209 Acute bronchitis, unspecified: Secondary | ICD-10-CM

## 2014-11-11 DIAGNOSIS — J301 Allergic rhinitis due to pollen: Secondary | ICD-10-CM

## 2014-11-11 DIAGNOSIS — N390 Urinary tract infection, site not specified: Secondary | ICD-10-CM

## 2014-11-11 DIAGNOSIS — Z780 Asymptomatic menopausal state: Secondary | ICD-10-CM

## 2014-11-11 DIAGNOSIS — J0100 Acute maxillary sinusitis, unspecified: Secondary | ICD-10-CM

## 2014-11-11 LAB — LIPID PANEL
Cholesterol: 136 mg/dL (ref 0–200)
HDL: 63.6 mg/dL (ref >39.00)
LDL Cholesterol: 59 mg/dL (ref 0–99)
NonHDL: 72.46
Total CHOL/HDL Ratio: 2
Triglycerides: 66 mg/dL (ref 0.0–149.0)
VLDL: 13.2 mg/dL (ref 0.0–40.0)

## 2014-11-11 LAB — COMPREHENSIVE METABOLIC PANEL
ALT: 19 U/L (ref 0–35)
AST: 20 U/L (ref 0–37)
Albumin: 3.6 g/dL (ref 3.5–5.2)
Alkaline Phosphatase: 58 U/L (ref 39–117)
BILIRUBIN TOTAL: 0.9 mg/dL (ref 0.2–1.2)
BUN: 13 mg/dL (ref 6–23)
CO2: 31 meq/L (ref 19–32)
Calcium: 9.1 mg/dL (ref 8.4–10.5)
Chloride: 102 mEq/L (ref 96–112)
Creatinine, Ser: 0.77 mg/dL (ref 0.40–1.20)
GFR: 76.35 mL/min (ref >60.00)
Glucose, Bld: 88 mg/dL (ref 70–99)
Potassium: 4.9 mEq/L (ref 3.5–5.1)
Sodium: 138 mEq/L (ref 135–145)
Total Protein: 6.8 g/dL (ref 6.0–8.3)

## 2014-11-11 LAB — CBC
HCT: 39.1 % (ref 36.0–46.0)
HEMOGLOBIN: 12.9 g/dL (ref 12.0–15.0)
MCHC: 33.1 g/dL (ref 30.0–36.0)
MCV: 98 fl (ref 78.0–100.0)
PLATELETS: 190 10*3/uL (ref 150.0–400.0)
RBC: 3.99 Mil/uL (ref 3.87–5.11)
RDW: 13.4 % (ref 11.5–15.5)
WBC: 6.3 10*3/uL (ref 4.0–10.5)

## 2014-11-11 LAB — HEMOGLOBIN A1C: Hgb A1c MFr Bld: 5.6 % (ref 4.6–6.5)

## 2014-11-11 LAB — TSH: TSH: 0.84 u[IU]/mL (ref 0.35–4.50)

## 2014-11-11 MED ORDER — FEXOFENADINE HCL 30 MG PO TBDP: 30.0000 mg | ORAL_TABLET | Freq: Every day | ORAL | Status: DC

## 2014-11-11 MED ORDER — MONTELUKAST SODIUM 10 MG PO TABS: 10.0000 mg | ORAL_TABLET | Freq: Every evening | ORAL | Status: DC | PRN

## 2014-11-11 MED ORDER — AZELASTINE-FLUTICASONE 137-50 MCG/ACT NA SUSP: 1.0000 | Freq: Every day | NASAL | Status: DC

## 2014-11-11 NOTE — Assessment & Plan Note (Signed)
Finish course of antibiotics and probiotics increase rest and hydration and report if worsens.

## 2014-11-11 NOTE — Progress Notes (Signed)
Subjective:   Alyssa Williamson is a 81 y.o. female who presents for Medicare Annual (Subsequent) preventive examination. patient is here today for annual exam and review of numerous concerns. She is struggling with some mild congestion but no fevers or chills. No ear pain. Has been taking Allegra with some relief temporarily. No recent febrile illness or trips to the emergency room or hospital. Denies CP/palp/SOB/HA/congestion/fevers/GI or GU c/o. Taking meds as prescribed Review of Systems:  Review of Systems  Constitutional: Negative for fever, chills and malaise/fatigue.  HENT: Positive for congestion. Negative for hearing loss.   Eyes: Negative for discharge.  Respiratory: Negative for cough, sputum production and shortness of breath.   Cardiovascular: Negative for chest pain, palpitations and leg swelling.  Gastrointestinal: Negative for heartburn, nausea, vomiting, abdominal pain, diarrhea, constipation and blood in stool.  Genitourinary: Negative for dysuria, urgency, frequency and hematuria.  Musculoskeletal: Negative for myalgias, back pain and falls.  Skin: Negative for rash.  Neurological: Negative for dizziness, sensory change, loss of consciousness, weakness and headaches.  Endo/Heme/Allergies: Negative for environmental allergies. Does not bruise/bleed easily.  Psychiatric/Behavioral: Negative for depression and suicidal ideas. The patient is not nervous/anxious and does not have insomnia.          Objective:     Vitals: BP 138/66 mmHg  Pulse 65  Temp(Src) 98.2 F (36.8 C) (Oral)  Ht  (1.575 m)  Wt 178 lb 2 oz (80.797 kg)  BMI 32.57 kg/m2  SpO2 92%  Tobacco History  Smoking status  . Never Smoker   Smokeless tobacco  . Never Used     Counseling given: Yes   Past Medical History  Diagnosis Date  . Diverticulitis   . Arthritis     bilateral knee  . Hypothyroid   . Allergic rhinitis   . Heart murmur   . Hyperlipidemia   . Hypertension   . PONV  (postoperative nausea and vomiting)   . Anxiety   . Anemia   . Left knee DJD   . Medicare annual wellness visit, subsequent 10/30/2013  . Cervical cancer screening 10/30/2013    Menarche at 15 Regular and moderate flow No history of abnormal pap in past G4P3, s/p 3 svd and 1 MC No history of abnormal MGM Noconcerns today No gyn surgeries   Past Surgical History  Procedure Laterality Date  . Replacement total knee  10/2010    right knee-Murphy   . Bladder tack  04/2010    Dr Marciano Sequin  . Abdominal hysterectomy    . Tonsillectomy    . Joint replacement Right   . Ganglion cyst excision    . Total knee arthroplasty Left 07/26/2012    Procedure: TOTAL KNEE ARTHROPLASTY;  Surgeon: Loreta Ave, MD;  Location: [facility];  Service: Orthopedics;  Laterality: Left;   Family History  Problem Relation Age of Onset  . Alcohol abuse Father   . Breast cancer Sister   . Cancer Sister     bladder  . Glaucoma Sister   . Arthritis Daughter   . Diabetes Daughter     type 2  . Cancer Daughter     brain cancer, diagnosed 29 years.agp  . Mental illness Daughter     s/p craniotomies, gamma knife for tumors. numerous shunts.  . Heart failure Mother   . Colon cancer Neg Hx   . Hypertension Neg Hx   . Cancer Brother     glioblastoma   . Glaucoma Brother   .  Diverticulosis Sister   . Cancer Sister     liver  . Cancer Brother     melanoma, mets to lung and brain and intestine  . Alcohol abuse Brother   . Cancer Brother     liver  . Glaucoma Maternal Grandfather   . Cancer Sister     breast   History  Sexual Activity  . Sexual Activity: No    Comment: no dietary restrictions, lives alone    Outpatient Encounter Prescriptions as of 11/11/2014  Medication Sig  . albuterol (PROVENTIL HFA;VENTOLIN HFA) 108 (90 BASE) MCG/ACT inhaler Inhale 2 puffs into the lungs every 6 (six) hours as needed for wheezing or shortness of breath.  . ALPRAZolam (XANAX) 0.25 MG tablet Take 1 tablet (0.25 mg total)  by mouth 2 (two) times daily as needed for anxiety or sleep.  Marland Kitchen atorvastatin (LIPITOR) 40 MG tablet Take 1 tablet (40 mg total) by mouth daily.  . beclomethasone (QVAR) 40 MCG/ACT inhaler Inhale 2 puffs into the lungs 2 (two) times daily.  . benzonatate (TESSALON) 100 MG capsule Take 1 capsule (100 mg total) by mouth 3 (three) times daily as needed.  . citalopram (CELEXA) 20 MG tablet TAKE 1 TABLET BY MOUTH DAILY  . Cranberry 500 MG CAPS Take 500 mg by mouth daily.  Marland Kitchen doxycycline (VIBRA-TABS) 100 MG tablet Take 1 tablet (100 mg total) by mouth 2 (two) times daily.  . furosemide (LASIX) 40 MG tablet Take 20 mg by mouth daily.   Marland Kitchen levothyroxine (SYNTHROID, LEVOTHROID) 75 MCG tablet Take 1 tablet (75 mcg total) by mouth daily.  . Multiple Vitamins-Minerals (CENTRUM SILVER PO) Take 1 tablet by mouth daily.  Marland Kitchen trimethoprim (TRIMPEX) 100 MG tablet Take 100 mg by mouth daily.   . verapamil (CALAN-SR) 180 MG CR tablet TAKE 1 TABLET BY MOUTH EVERY EVENING AT BEDTIME  . [DISCONTINUED] fluticasone (FLONASE) 50 MCG/ACT nasal spray Place 2 sprays into both nostrils daily.  . [DISCONTINUED] loratadine (CLARITIN) 10 MG tablet Take 10 mg by mouth daily.  . Azelastine-Fluticasone 137-50 MCG/ACT SUSP Place 1 spray into the nose daily.  . fexofenadine (ALLEGRA ODT) 30 MG disintegrating tablet Take 1 tablet (30 mg total) by mouth daily.  . montelukast (SINGULAIR) 10 MG tablet Take 1 tablet (10 mg total) by mouth at bedtime as needed.   Facility-Administered Encounter Medications as of 11/11/2014  Medication  . pneumococcal 13-valent conjugate vaccine (PREVNAR 13) injection 0.5 mL    Activities of Daily Living In your present state of health, do you have any difficulty performing the following activities: 05/09/2014  Hearing? N  Vision? N  Difficulty concentrating or making decisions? N  Walking or climbing stairs? N  Dressing or bathing? N  Doing errands, shopping? N    Patient Care Team: Bradd Canary,  MD as PCP - General (Family Medicine) Su Grand, MD as Consulting Physician (Urology) Lupe Carney Karolee Stamps, PA-C (Dermatology) Lewayne Bunting, MD as Consulting Physician (Cardiology)    Assessment:    Physical Exam  Constitutional: She is oriented to person, place, and time and well-developed, well-nourished, and in no distress. No distress.  HENT:  Head: Normocephalic and atraumatic.  Right Ear: External ear normal.  Left Ear: External ear normal.  Nose: Nose normal.  Mouth/Throat: Oropharynx is clear and moist. No oropharyngeal exudate.  Eyes: Conjunctivae are normal. Pupils are equal, round, and reactive to light. Right eye exhibits no discharge. Left eye exhibits no discharge. No scleral icterus.  Neck: Normal range of  motion. Neck supple. No thyromegaly present.  Cardiovascular: Normal rate, regular rhythm, normal heart sounds and intact distal pulses.   Pulmonary/Chest: Effort normal and breath sounds normal. No respiratory distress. She has no wheezes. She has no rales.  Abdominal: Soft. Bowel sounds are normal. She exhibits no distension and no mass. There is no tenderness.  Musculoskeletal: Normal range of motion. She exhibits no edema or tenderness.  Lymphadenopathy:    She has no cervical adenopathy.  Neurological: She is alert and oriented to person, place, and time. She has normal reflexes. No cranial nerve deficit. Coordination normal.  Skin: Skin is warm and dry. No rash noted. She is not diaphoretic.  Psychiatric: Mood, memory and affect normal.   Exercise Activities and Dietary recommendations    Goals    None     Fall Risk Fall Risk  10/30/2013  Falls in the past year? No   Depression Screen PHQ 2/9 Scores 10/30/2013  PHQ - 2 Score 0     Cognitive Testing No flowsheet data found.  Immunization History  Administered Date(s) Administered  . Influenza Whole 11/17/2012  . Influenza,inj,Quad PF,36+ Mos 10/30/2013  . Pneumococcal Conjugate-13 10/30/2013  .  Tdap 09/04/2012   Screening Tests Health Maintenance  Topic Date Due  . DEXA SCAN  03/24/1998  . INFLUENZA VACCINE  09/23/2014  . PNA vac Low Risk Adult (2 of 2 - PPSV23) 10/31/2014  . ZOSTAVAX  05/09/2015 (Originally 03/24/1993)  . TETANUS/TDAP  09/05/2022      Plan:    Hypothyroidism On Levothyroxine, continue to monitor  Hyperlipidemia Tolerating statin, encouraged heart healthy diet, avoid trans fats, minimize simple carbs and saturated fats. Increase exercise as tolerated  Acute bronchitis Has been to urgent care twice and treated for bronchitis most recently with Augmentin. Some response but it appears to have a allergic triggers. Started after a recent trip to [place] with congestion and has a long h/o allergies. Finish course of ABX and add a probiotic  Allergic rhinitis Continue Allegra and Dymista in morning and will add Singulair in evening and nasal saline qhs and prn.  HTN (hypertension) Well controlled, no changes to meds. Encouraged heart healthy diet such as the DASH diet and exercise as tolerated.   Acute maxillary sinusitis Finish course of antibiotics and probiotics increase rest and hydration and report if worsens.  Hyperglycemia hgba1c today minimize simple carbs. Increase exercise as tolerated.   Recurrent UTI Follows with [facility], Dr Brunilda Payor is retiring soon so will be assigned a new urologist. Has been put back on daily TMP qhs feels well  Medicare annual wellness visit, subsequent Patient denies any difficulties at home. No trouble with ADLs, depression or falls. No recent changes to vision or hearing. Is UTD with immunizations. Is UTD with screening. Discussed Advanced Directives, patient agrees to bring [place] copies of documents if can. Encouraged heart healthy diet, exercise as tolerated and adequate sleep. Labs reviewed Follows with [facility], Dr Brunilda Payor Dr Jens Som, cardiology [facility], needs colonoscopy every 5 yrs is  UTD Declines MGM at this time [facility] care for opthamology   See problem list for risk factors See AVS for recommended preventative health recommendations.   During the course of the visit the patient was educated and counseled about the following appropriate screening and preventive services:   Vaccines to include Pneumoccal, Influenza, Hepatitis B, Td, Zostavax, HCV  Electrocardiogram  Cardiovascular Disease  Colorectal cancer screening  Bone density screening  Diabetes screening  Glaucoma screening  Mammography/PAP  Nutrition counseling   Patient Instructions (the written plan) was given to the patient.   Danise Edge, MD  11/17/2014

## 2014-11-11 NOTE — Assessment & Plan Note (Signed)
hgba1c today minimize simple carbs. Increase exercise as tolerated.

## 2014-11-11 NOTE — Assessment & Plan Note (Signed)
Tolerating statin, encouraged heart healthy diet, avoid trans fats, minimize simple carbs and saturated fats. Increase exercise as tolerated 

## 2014-11-11 NOTE — Assessment & Plan Note (Signed)
On Levothyroxine, continue to monitor 

## 2014-11-11 NOTE — Assessment & Plan Note (Signed)
Well controlled, no changes to meds. Encouraged heart healthy diet such as the DASH diet and exercise as tolerated.  °

## 2014-11-11 NOTE — Assessment & Plan Note (Signed)
Continue Allegra and Dymista in morning and will add Singulair in evening and nasal saline qhs and prn.

## 2014-11-11 NOTE — Assessment & Plan Note (Signed)
Follows with [facility], Dr Brunilda Payor is retiring soon so will be assigned a new urologist. Has been put back on daily TMP qhs feels well

## 2014-11-11 NOTE — Progress Notes (Signed)
Pre visit review using our clinic review tool, if applicable. No additional management support is needed unless otherwise documented below in the visit note. 

## 2014-11-11 NOTE — Patient Instructions (Signed)
Nasal saline after working outside and at bedtime  Use [employer] in [place] for Adults A healthy lifestyle and preventive care can promote health and wellness. Preventive health guidelines for women include the following key practices.  A routine yearly physical is a good way to check with your health care provider about your health and preventive screening. It is a chance to share any concerns and updates on your health and to receive a thorough exam.  Visit your dentist for a routine exam and preventive care every 6 months. Brush your teeth twice a day and floss once a day. Good oral hygiene prevents tooth decay and gum disease.  The frequency of eye exams is based on your age, health, family medical history, use of contact lenses, and other factors. Follow your health care provider's recommendations for frequency of eye exams.  Eat a healthy diet. Foods like vegetables, fruits, whole grains, low-fat dairy products, and lean protein foods contain the nutrients you need without too many calories. Decrease your intake of foods high in solid fats, added sugars, and salt. Eat the right amount of calories for you.Get information about a proper diet from your health care provider, if necessary.  Regular physical exercise is one of the most important things you can do for your health. Most adults should get at least 150 minutes of moderate-intensity exercise (any activity that increases your heart rate and causes you to sweat) each week. In addition, most adults need muscle-strengthening exercises on 2 or more days a week.  Maintain a healthy weight. The body mass index (BMI) is a screening tool to identify possible weight problems. It provides an estimate of body fat based on height and weight. Your health care provider can find your BMI and can help you achieve or maintain a healthy weight.For adults 20 years and older:  A BMI below 18.5 is considered underweight.  A  BMI of 18.5 to 24.9 is normal.  A BMI of 25 to 29.9 is considered overweight.  A BMI of 30 and above is considered obese.  Maintain normal blood lipids and cholesterol levels by exercising and minimizing your intake of saturated fat. Eat a balanced diet with plenty of fruit and vegetables. Blood tests for lipids and cholesterol should begin at age 23 and be repeated every 5 years. If your lipid or cholesterol levels are high, you are over 50, or you are at high risk for heart disease, you may need your cholesterol levels checked more frequently.Ongoing high lipid and cholesterol levels should be treated with medicines if diet and exercise are not working.  If you smoke, find out from your health care provider how to quit. If you do not use tobacco, do not start.  Lung cancer screening is recommended for adults aged 33-80 years who are at high risk for developing lung cancer because of a history of smoking. A yearly low-dose CT scan of the lungs is recommended for people who have at least a 30-pack-year history of smoking and are a current smoker or have quit within the past 15 years. A pack year of smoking is smoking an average of 1 pack of cigarettes a day for 1 year (for example: 1 pack a day for 30 years or 2 packs a day for 15 years). Yearly screening should continue until the smoker has stopped smoking for at least 15 years. Yearly screening should be stopped for people who develop a health problem that would prevent them from  having lung cancer treatment.  If you are pregnant, do not drink alcohol. If you are breastfeeding, be very cautious about drinking alcohol. If you are not pregnant and choose to drink alcohol, do not have more than 1 drink per day. One drink is considered to be 12 ounces (355 mL) of beer, 5 ounces (148 mL) of wine, or 1.5 ounces (44 mL) of liquor.  Avoid use of street drugs. Do not share needles with anyone. Ask for help if you need support or instructions about stopping  the use of drugs.  High blood pressure causes heart disease and increases the risk of stroke. Your blood pressure should be checked at least every 1 to 2 years. Ongoing high blood pressure should be treated with medicines if weight loss and exercise do not work.  If you are 87-32 years old, ask your health care provider if you should take aspirin to prevent strokes.  Diabetes screening involves taking a blood sample to check your fasting blood sugar level. This should be done once every 3 years, after age 41, if you are within normal weight and without risk factors for diabetes. Testing should be considered at a younger age or be carried out more frequently if you are overweight and have at least 1 risk factor for diabetes.  Breast cancer screening is essential preventive care for women. You should practice "breast self-awareness." This means understanding the normal appearance and feel of your breasts and may include breast self-examination. Any changes detected, no matter how small, should be reported to a health care provider. Women in their 36s and 30s should have a clinical breast exam (CBE) by a health care provider as part of a regular health exam every 1 to 3 years. After age 19, women should have a CBE every year. Starting at age 70, women should consider having a mammogram (breast X-ray test) every year. Women who have a family history of breast cancer should talk to their health care provider about genetic screening. Women at a high risk of breast cancer should talk to their health care providers about having an MRI and a mammogram every year.  Breast cancer gene (BRCA)-related cancer risk assessment is recommended for women who have family members with BRCA-related cancers. BRCA-related cancers include breast, ovarian, tubal, and peritoneal cancers. Having family members with these cancers may be associated with an increased risk for harmful changes (mutations) in the breast cancer genes BRCA1  and BRCA2. Results of the assessment will determine the need for genetic counseling and BRCA1 and BRCA2 testing.  Routine pelvic exams to screen for cancer are no longer recommended for nonpregnant women who are considered low risk for cancer of the pelvic organs (ovaries, uterus, and vagina) and who do not have symptoms. Ask your health care provider if a screening pelvic exam is right for you.  If you have had past treatment for cervical cancer or a condition that could lead to cancer, you need Pap tests and screening for cancer for at least 20 years after your treatment. If Pap tests have been discontinued, your risk factors (such as having a new sexual partner) need to be reassessed to determine if screening should be resumed. Some women have medical problems that increase the chance of getting cervical cancer. In these cases, your health care provider may recommend more frequent screening and Pap tests.  The HPV test is an additional test that may be used for cervical cancer screening. The HPV test looks for the virus that  can cause the cell changes on the cervix. The cells collected during the Pap test can be tested for HPV. The HPV test could be used to screen women aged 35 years and older, and should be used in women of any age who have unclear Pap test results. After the age of 46, women should have HPV testing at the same frequency as a Pap test.  Colorectal cancer can be detected and often prevented. Most routine colorectal cancer screening begins at the age of 36 years and continues through age 30 years. However, your health care provider may recommend screening at an earlier age if you have risk factors for colon cancer. On a yearly basis, your health care provider may provide home test kits to check for hidden blood in the stool. Use of a small camera at the end of a tube, to directly examine the colon (sigmoidoscopy or colonoscopy), can detect the earliest forms of colorectal cancer. Talk to  your health care provider about this at age 41, when routine screening begins. Direct exam of the colon should be repeated every 5-10 years through age 13 years, unless early forms of pre-cancerous polyps or small growths are found.  People who are at an increased risk for hepatitis B should be screened for this virus. You are considered at high risk for hepatitis B if:  You were born in a country where hepatitis B occurs often. Talk with your health care provider about which countries are considered high risk.  Your parents were born in a high-risk country and you have not received a shot to protect against hepatitis B (hepatitis B vaccine).  You have HIV or AIDS.  You use needles to inject street drugs.  You live with, or have sex with, someone who has hepatitis B.  You get hemodialysis treatment.  You take certain medicines for conditions like cancer, organ transplantation, and autoimmune conditions.  Hepatitis C blood testing is recommended for all people born from 1 through 1965 and any individual with known risks for hepatitis C.  Practice safe sex. Use condoms and avoid high-risk sexual practices to reduce the spread of sexually transmitted infections (STIs). STIs include gonorrhea, chlamydia, syphilis, trichomonas, herpes, HPV, and human immunodeficiency virus (HIV). Herpes, HIV, and HPV are viral illnesses that have no cure. They can result in disability, cancer, and death.  You should be screened for sexually transmitted illnesses (STIs) including gonorrhea and chlamydia if:  You are sexually active and are younger than 24 years.  You are older than 24 years and your health care provider tells you that you are at risk for this type of infection.  Your sexual activity has changed since you were last screened and you are at an increased risk for chlamydia or gonorrhea. Ask your health care provider if you are at risk.  If you are at risk of being infected with HIV, it is  recommended that you take a prescription medicine daily to prevent HIV infection. This is called preexposure prophylaxis (PrEP). You are considered at risk if:  You are a heterosexual woman, are sexually active, and are at increased risk for HIV infection.  You take drugs by injection.  You are sexually active with a partner who has HIV.  Talk with your health care provider about whether you are at high risk of being infected with HIV. If you choose to begin PrEP, you should first be tested for HIV. You should then be tested every 3 months for as long as  you are taking PrEP.  Osteoporosis is a disease in which the bones lose minerals and strength with aging. This can result in serious bone fractures or breaks. The risk of osteoporosis can be identified using a bone density scan. Women ages 89 years and over and women at risk for fractures or osteoporosis should discuss screening with their health care providers. Ask your health care provider whether you should take a calcium supplement or vitamin D to reduce the rate of osteoporosis.  Menopause can be associated with physical symptoms and risks. Hormone replacement therapy is available to decrease symptoms and risks. You should talk to your health care provider about whether hormone replacement therapy is right for you.  Use sunscreen. Apply sunscreen liberally and repeatedly throughout the day. You should seek shade when your shadow is shorter than you. Protect yourself by wearing long sleeves, pants, a wide-brimmed hat, and sunglasses year round, whenever you are outdoors.  Once a month, do a whole body skin exam, using a mirror to look at the skin on your back. Tell your health care provider of new moles, moles that have irregular borders, moles that are larger than a pencil eraser, or moles that have changed in shape or color.  Stay current with required vaccines (immunizations).  Influenza vaccine. All adults should be immunized every  year.  Tetanus, diphtheria, and acellular pertussis (Td, Tdap) vaccine. Pregnant women should receive 1 dose of Tdap vaccine during each pregnancy. The dose should be obtained regardless of the length of time since the last dose. Immunization is preferred during the 27th-36th week of gestation. An adult who has not previously received Tdap or who does not know her vaccine status should receive 1 dose of Tdap. This initial dose should be followed by tetanus and diphtheria toxoids (Td) booster doses every 10 years. Adults with an unknown or incomplete history of completing a 3-dose immunization series with Td-containing vaccines should begin or complete a primary immunization series including a Tdap dose. Adults should receive a Td booster every 10 years.  Varicella vaccine. An adult without evidence of immunity to varicella should receive 2 doses or a second dose if she has previously received 1 dose. Pregnant females who do not have evidence of immunity should receive the first dose after pregnancy. This first dose should be obtained before leaving the health care facility. The second dose should be obtained 4-8 weeks after the first dose.  Human papillomavirus (HPV) vaccine. Females aged 13-26 years who have not received the vaccine previously should obtain the 3-dose series. The vaccine is not recommended for use in pregnant females. However, pregnancy testing is not needed before receiving a dose. If a female is found to be pregnant after receiving a dose, no treatment is needed. In that case, the remaining doses should be delayed until after the pregnancy. Immunization is recommended for any person with an immunocompromised condition through the age of 73 years if she did not get any or all doses earlier. During the 3-dose series, the second dose should be obtained 4-8 weeks after the first dose. The third dose should be obtained 24 weeks after the first dose and 16 weeks after the second dose.  Zoster  vaccine. One dose is recommended for adults aged 71 years or older unless certain conditions are present.  Measles, mumps, and rubella (MMR) vaccine. Adults born before [birth year] generally are considered immune to measles and mumps. Adults born in [birth year] or later should have 1 or more doses of MMR  vaccine unless there is a contraindication to the vaccine or there is laboratory evidence of immunity to each of the three diseases. A routine second dose of MMR vaccine should be obtained at least 28 days after the first dose for students attending postsecondary schools, health care workers, or international travelers. People who received inactivated measles vaccine or an unknown type of measles vaccine during 1963-1967 should receive 2 doses of MMR vaccine. People who received inactivated mumps vaccine or an unknown type of mumps vaccine before 1979 and are at high risk for mumps infection should consider immunization with 2 doses of MMR vaccine. For females of childbearing age, rubella immunity should be determined. If there is no evidence of immunity, females who are not pregnant should be vaccinated. If there is no evidence of immunity, females who are pregnant should delay immunization until after pregnancy. Unvaccinated health care workers born before [birth year] who lack laboratory evidence of measles, mumps, or rubella immunity or laboratory confirmation of disease should consider measles and mumps immunization with 2 doses of MMR vaccine or rubella immunization with 1 dose of MMR vaccine.  Pneumococcal 13-valent conjugate (PCV13) vaccine. When indicated, a person who is uncertain of her immunization history and has no record of immunization should receive the PCV13 vaccine. An adult aged 81 years or older who has certain medical conditions and has not been previously immunized should receive 1 dose of PCV13 vaccine. This PCV13 should be followed with a dose of pneumococcal polysaccharide (PPSV23) vaccine. The PPSV23  vaccine dose should be obtained at least 8 weeks after the dose of PCV13 vaccine. An adult aged 14 years or older who has certain medical conditions and previously received 1 or more doses of PPSV23 vaccine should receive 1 dose of PCV13. The PCV13 vaccine dose should be obtained 1 or more years after the last PPSV23 vaccine dose.  Pneumococcal polysaccharide (PPSV23) vaccine. When PCV13 is also indicated, PCV13 should be obtained first. All adults aged 33 years and older should be immunized. An adult younger than age 84 years who has certain medical conditions should be immunized. Any person who resides in a nursing home or long-term care facility should be immunized. An adult smoker should be immunized. People with an immunocompromised condition and certain other conditions should receive both PCV13 and PPSV23 vaccines. People with human immunodeficiency virus (HIV) infection should be immunized as soon as possible after diagnosis. Immunization during chemotherapy or radiation therapy should be avoided. Routine use of PPSV23 vaccine is not recommended for American Indians, [place] Natives, or people younger than 65 years unless there are medical conditions that require PPSV23 vaccine. When indicated, people who have unknown immunization and have no record of immunization should receive PPSV23 vaccine. One-time revaccination 5 years after the first dose of PPSV23 is recommended for people aged 19-64 years who have chronic kidney failure, nephrotic syndrome, asplenia, or immunocompromised conditions. People who received 1-2 doses of PPSV23 before age 43 years should receive another dose of PPSV23 vaccine at age 43 years or later if at least 5 years have passed since the previous dose. Doses of PPSV23 are not needed for people immunized with PPSV23 at or after age 20 years.  Meningococcal vaccine. Adults with asplenia or persistent complement component deficiencies should receive 2 doses of quadrivalent  meningococcal conjugate (MenACWY-D) vaccine. The doses should be obtained at least 2 months apart. Microbiologists working with certain meningococcal bacteria, [place] recruits, people at risk during an outbreak, and people who travel to or live in  countries with a high rate of meningitis should be immunized. A first-year college student up through age 1 years who is living in a residence hall should receive a dose if she did not receive a dose on or after her 16th birthday. Adults who have certain high-risk conditions should receive one or more doses of vaccine.  Hepatitis A vaccine. Adults who wish to be protected from this disease, have certain high-risk conditions, work with hepatitis A-infected animals, work in hepatitis A research labs, or travel to or work in countries with a high rate of hepatitis A should be immunized. Adults who were previously unvaccinated and who anticipate close contact with an international adoptee during the first 60 days after arrival in the [place] from a country with a high rate of hepatitis A should be immunized.  Hepatitis B vaccine. Adults who wish to be protected from this disease, have certain high-risk conditions, may be exposed to blood or other infectious body fluids, are household contacts or sex partners of hepatitis B positive people, are clients or workers in certain care facilities, or travel to or work in countries with a high rate of hepatitis B should be immunized.  Haemophilus influenzae type b (Hib) vaccine. A previously unvaccinated person with asplenia or sickle cell disease or having a scheduled splenectomy should receive 1 dose of Hib vaccine. Regardless of previous immunization, a recipient of a hematopoietic stem cell transplant should receive a 3-dose series 6-12 months after her successful transplant. Hib vaccine is not recommended for adults with HIV infection. Preventive Services / Frequency Ages 24 to 52 years  Blood pressure check.**  / Every 1 to 2 years.  Lipid and cholesterol check.** / Every 5 years beginning at age 26.  Clinical breast exam.** / Every 3 years for women in their 45s and 51s.  BRCA-related cancer risk assessment.** / For women who have family members with a BRCA-related cancer (breast, ovarian, tubal, or peritoneal cancers).  Pap test.** / Every 2 years from ages 5 through 46. Every 3 years starting at age 53 through age 41 or 30 with a history of 3 consecutive normal Pap tests.  HPV screening.** / Every 3 years from ages 66 through ages 78 to 46 with a history of 3 consecutive normal Pap tests.  Hepatitis C blood test.** / For any individual with known risks for hepatitis C.  Skin self-exam. / Monthly.  Influenza vaccine. / Every year.  Tetanus, diphtheria, and acellular pertussis (Tdap, Td) vaccine.** / Consult your health care provider. Pregnant women should receive 1 dose of Tdap vaccine during each pregnancy. 1 dose of Td every 10 years.  Varicella vaccine.** / Consult your health care provider. Pregnant females who do not have evidence of immunity should receive the first dose after pregnancy.  HPV vaccine. / 3 doses over 6 months, if 79 and younger. The vaccine is not recommended for use in pregnant females. However, pregnancy testing is not needed before receiving a dose.  Measles, mumps, rubella (MMR) vaccine.** / You need at least 1 dose of MMR if you were born in 1957 or later. You may also need a 2nd dose. For females of childbearing age, rubella immunity should be determined. If there is no evidence of immunity, females who are not pregnant should be vaccinated. If there is no evidence of immunity, females who are pregnant should delay immunization until after pregnancy.  Pneumococcal 13-valent conjugate (PCV13) vaccine.** / Consult your health care provider.  Pneumococcal polysaccharide (PPSV23) vaccine.** /  1 to 2 doses if you smoke cigarettes or if you have certain  conditions.  Meningococcal vaccine.** / 1 dose if you are age 80 to 63 years and a Market researcher living in a residence hall, or have one of several medical conditions, you need to get vaccinated against meningococcal disease. You may also need additional booster doses.  Hepatitis A vaccine.** / Consult your health care provider.  Hepatitis B vaccine.** / Consult your health care provider.  Haemophilus influenzae type b (Hib) vaccine.** / Consult your health care provider. Ages 77 to 92 years  Blood pressure check.** / Every 1 to 2 years.  Lipid and cholesterol check.** / Every 5 years beginning at age 65 years.  Lung cancer screening. / Every year if you are aged 50-80 years and have a 30-pack-year history of smoking and currently smoke or have quit within the past 15 years. Yearly screening is stopped once you have quit smoking for at least 15 years or develop a health problem that would prevent you from having lung cancer treatment.  Clinical breast exam.** / Every year after age 2 years.  BRCA-related cancer risk assessment.** / For women who have family members with a BRCA-related cancer (breast, ovarian, tubal, or peritoneal cancers).  Mammogram.** / Every year beginning at age 85 years and continuing for as long as you are in good health. Consult with your health care provider.  Pap test.** / Every 3 years starting at age 55 years through age 41 or 79 years with a history of 3 consecutive normal Pap tests.  HPV screening.** / Every 3 years from ages 75 years through ages 53 to 77 years with a history of 3 consecutive normal Pap tests.  Fecal occult blood test (FOBT) of stool. / Every year beginning at age 59 years and continuing until age 70 years. You may not need to do this test if you get a colonoscopy every 10 years.  Flexible sigmoidoscopy or colonoscopy.** / Every 5 years for a flexible sigmoidoscopy or every 10 years for a colonoscopy beginning at age 97 years  and continuing until age 67 years.  Hepatitis C blood test.** / For all people born from [birth year] through 1963 and any individual with known risks for hepatitis C.  Skin self-exam. / Monthly.  Influenza vaccine. / Every year.  Tetanus, diphtheria, and acellular pertussis (Tdap/Td) vaccine.** / Consult your health care provider. Pregnant women should receive 1 dose of Tdap vaccine during each pregnancy. 1 dose of Td every 10 years.  Varicella vaccine.** / Consult your health care provider. Pregnant females who do not have evidence of immunity should receive the first dose after pregnancy.  Zoster vaccine.** / 1 dose for adults aged 72 years or older.  Measles, mumps, rubella (MMR) vaccine.** / You need at least 1 dose of MMR if you were born in 1957 or later. You may also need a 2nd dose. For females of childbearing age, rubella immunity should be determined. If there is no evidence of immunity, females who are not pregnant should be vaccinated. If there is no evidence of immunity, females who are pregnant should delay immunization until after pregnancy.  Pneumococcal 13-valent conjugate (PCV13) vaccine.** / Consult your health care provider.  Pneumococcal polysaccharide (PPSV23) vaccine.** / 1 to 2 doses if you smoke cigarettes or if you have certain conditions.  Meningococcal vaccine.** / Consult your health care provider.  Hepatitis A vaccine.** / Consult your health care provider.  Hepatitis B vaccine.** / Consult your  health care provider.  Haemophilus influenzae type b (Hib) vaccine.** / Consult your health care provider. Ages 20 years and over  Blood pressure check.** / Every 1 to 2 years.  Lipid and cholesterol check.** / Every 5 years beginning at age 97 years.  Lung cancer screening. / Every year if you are aged 66-80 years and have a 30-pack-year history of smoking and currently smoke or have quit within the past 15 years. Yearly screening is stopped once you have quit smoking  for at least 15 years or develop a health problem that would prevent you from having lung cancer treatment.  Clinical breast exam.** / Every year after age 31 years.  BRCA-related cancer risk assessment.** / For women who have family members with a BRCA-related cancer (breast, ovarian, tubal, or peritoneal cancers).  Mammogram.** / Every year beginning at age 51 years and continuing for as long as you are in good health. Consult with your health care provider.  Pap test.** / Every 3 years starting at age 66 years through age 60 or 76 years with 3 consecutive normal Pap tests. Testing can be stopped between 65 and 70 years with 3 consecutive normal Pap tests and no abnormal Pap or HPV tests in the past 10 years.  HPV screening.** / Every 3 years from ages 30 years through ages 105 or 38 years with a history of 3 consecutive normal Pap tests. Testing can be stopped between 65 and 70 years with 3 consecutive normal Pap tests and no abnormal Pap or HPV tests in the past 10 years.  Fecal occult blood test (FOBT) of stool. / Every year beginning at age 35 years and continuing until age 4 years. You may not need to do this test if you get a colonoscopy every 10 years.  Flexible sigmoidoscopy or colonoscopy.** / Every 5 years for a flexible sigmoidoscopy or every 10 years for a colonoscopy beginning at age 24 years and continuing until age 74 years.  Hepatitis C blood test.** / For all people born from [birth year] through 1963 and any individual with known risks for hepatitis C.  Osteoporosis screening.** / A one-time screening for women ages 53 years and over and women at risk for fractures or osteoporosis.  Skin self-exam. / Monthly.  Influenza vaccine. / Every year.  Tetanus, diphtheria, and acellular pertussis (Tdap/Td) vaccine.** / 1 dose of Td every 10 years.  Varicella vaccine.** / Consult your health care provider.  Zoster vaccine.** / 1 dose for adults aged 14 years or older.  Pneumococcal  13-valent conjugate (PCV13) vaccine.** / Consult your health care provider.  Pneumococcal polysaccharide (PPSV23) vaccine.** / 1 dose for all adults aged 17 years and older.  Meningococcal vaccine.** / Consult your health care provider.  Hepatitis A vaccine.** / Consult your health care provider.  Hepatitis B vaccine.** / Consult your health care provider.  Haemophilus influenzae type b (Hib) vaccine.** / Consult your health care provider. ** Family history and personal history of risk and conditions may change your health care provider's recommendations. Document Released: 04/06/2001 Document Revised: 06/25/2013 Document Reviewed: 07/06/2010 [facility] Patient Information 2015 [place], [place]. This information is not intended to replace advice given to you by your health care provider. Make sure you discuss any questions you have with your health care provider. Allergic Rhinitis Allergic rhinitis is when the mucous membranes in the nose respond to allergens. Allergens are particles in the air that cause your body to have an allergic reaction. This causes you to release allergic  antibodies. Through a chain of events, these eventually cause you to release histamine into the blood stream. Although meant to protect the body, it is this release of histamine that causes your discomfort, such as frequent sneezing, congestion, and an itchy, runny nose.  CAUSES  Seasonal allergic rhinitis (hay fever) is caused by pollen allergens that may come from grasses, trees, and weeds. Year-round allergic rhinitis (perennial allergic rhinitis) is caused by allergens such as house dust mites, pet dander, and mold spores.  SYMPTOMS   Nasal stuffiness (congestion).  Itchy, runny nose with sneezing and tearing of the eyes. DIAGNOSIS  Your health care provider can help you determine the allergen or allergens that trigger your symptoms. If you and your health care provider are unable to determine the allergen, skin or  blood testing may be used. TREATMENT  Allergic rhinitis does not have a cure, but it can be controlled by:  Medicines and allergy shots (immunotherapy).  Avoiding the allergen. Hay fever may often be treated with antihistamines in pill or nasal spray forms. Antihistamines block the effects of histamine. There are over-the-counter medicines that may help with nasal congestion and swelling around the eyes. Check with your health care provider before taking or giving this medicine.  If avoiding the allergen or the medicine prescribed do not work, there are many new medicines your health care provider can prescribe. Stronger medicine may be used if initial measures are ineffective. Desensitizing injections can be used if medicine and avoidance does not work. Desensitization is when a patient is given ongoing shots until the body becomes less sensitive to the allergen. Make sure you follow up with your health care provider if problems continue. HOME CARE INSTRUCTIONS It is not possible to completely avoid allergens, but you can reduce your symptoms by taking steps to limit your exposure to them. It helps to know exactly what you are allergic to so that you can avoid your specific triggers. SEEK MEDICAL CARE IF:   You have a fever.  You develop a cough that does not stop easily (persistent).  You have shortness of breath.  You start wheezing.  Symptoms interfere with normal daily activities. Document Released: 11/03/2000 Document Revised: 02/13/2013 Document Reviewed: 10/16/2012 [facility] Patient Information 2015 [place], [place]. This information is not intended to replace advice given to you by your health care provider. Make sure you discuss any questions you have with your health care provider.

## 2014-11-11 NOTE — Assessment & Plan Note (Signed)
Has been to urgent care twice and treated for bronchitis most recently with Augmentin. Some response but it appears to have a allergic triggers. Started after a recent trip to [place] with congestion and has a long h/o allergies. Finish course of ABX and add a probiotic

## 2014-11-15 ENCOUNTER — Other Ambulatory Visit: Payer: Self-pay | Admitting: Family

## 2014-11-15 ENCOUNTER — Other Ambulatory Visit: Payer: Self-pay | Admitting: Family Medicine

## 2014-11-15 DIAGNOSIS — R911 Solitary pulmonary nodule: Secondary | ICD-10-CM

## 2014-11-17 NOTE — Assessment & Plan Note (Signed)
Patient denies any difficulties at home. No trouble with ADLs, depression or falls. No recent changes to vision or hearing. Is UTD with immunizations. Is UTD with screening. Discussed Advanced Directives, patient agrees to bring [place] copies of documents if can. Encouraged heart healthy diet, exercise as tolerated and adequate sleep. Labs reviewed Follows with [facility], Dr Brunilda Payor Dr Jens Som, cardiology [facility], needs colonoscopy every 5 yrs is UTD Declines MGM at this time [facility] care for opthamology   See problem list for risk factors See AVS for recommended preventative health recommendations.

## 2014-11-19 ENCOUNTER — Other Ambulatory Visit: Payer: Self-pay | Admitting: Family Medicine

## 2014-11-19 DIAGNOSIS — E041 Nontoxic single thyroid nodule: Secondary | ICD-10-CM

## 2014-11-25 ENCOUNTER — Other Ambulatory Visit: Payer: Self-pay | Admitting: Family Medicine

## 2014-11-25 ENCOUNTER — Telehealth: Payer: Self-pay | Admitting: Family Medicine

## 2014-11-25 DIAGNOSIS — J309 Allergic rhinitis, unspecified: Secondary | ICD-10-CM

## 2014-11-25 NOTE — Telephone Encounter (Signed)
Pt said that she is still struggling with sinus and allergy issues and is wondering if she should see ENT since it's been ongoing. She feels it may be asthmatic. Please notify pt of recommendation @ [phone].

## 2014-11-25 NOTE — Telephone Encounter (Signed)
I have placed referral

## 2014-11-26 NOTE — Telephone Encounter (Signed)
Patient informed referral done.

## 2015-01-21 ENCOUNTER — Other Ambulatory Visit: Payer: Self-pay | Admitting: Family Medicine

## 2015-01-21 MED ORDER — CITALOPRAM HYDROBROMIDE 20 MG PO TABS: 20.0000 mg | ORAL_TABLET | Freq: Every day | ORAL | Status: DC

## 2015-02-03 ENCOUNTER — Other Ambulatory Visit: Payer: Self-pay | Admitting: Family Medicine

## 2015-02-03 DIAGNOSIS — I10 Essential (primary) hypertension: Secondary | ICD-10-CM

## 2015-02-03 DIAGNOSIS — E785 Hyperlipidemia, unspecified: Secondary | ICD-10-CM

## 2015-02-03 MED ORDER — ALPRAZOLAM 0.25 MG PO TABS: 0.2500 mg | ORAL_TABLET | Freq: Two times a day (BID) | ORAL | Status: DC | PRN

## 2015-02-03 NOTE — Telephone Encounter (Signed)
Faxed hardcpy for Alprazolam to [facility] [address]

## 2015-02-03 NOTE — Telephone Encounter (Signed)
Requesting:  Alprazolam Contract 05/09/2014 UDS  Low risk Last OV   11/11/2014 Last Refill   #[phone] with 2 refills on 09/24/2014  Please Advise

## 2015-02-10 ENCOUNTER — Encounter: Payer: Self-pay | Admitting: Family Medicine

## 2015-02-10 ENCOUNTER — Ambulatory Visit (INDEPENDENT_AMBULATORY_CARE_PROVIDER_SITE_OTHER): Payer: Medicare Other | Admitting: Family Medicine

## 2015-02-10 VITALS — BP 142/64 | HR 60 | Temp 97.6°F | Ht 62.0 in | Wt 178.1 lb

## 2015-02-10 DIAGNOSIS — E871 Hypo-osmolality and hyponatremia: Secondary | ICD-10-CM

## 2015-02-10 DIAGNOSIS — E785 Hyperlipidemia, unspecified: Secondary | ICD-10-CM

## 2015-02-10 DIAGNOSIS — R1084 Generalized abdominal pain: Secondary | ICD-10-CM

## 2015-02-10 DIAGNOSIS — Z23 Encounter for immunization: Secondary | ICD-10-CM | POA: Diagnosis not present

## 2015-02-10 DIAGNOSIS — E039 Hypothyroidism, unspecified: Secondary | ICD-10-CM | POA: Diagnosis not present

## 2015-02-10 DIAGNOSIS — D649 Anemia, unspecified: Secondary | ICD-10-CM | POA: Diagnosis not present

## 2015-02-10 DIAGNOSIS — I10 Essential (primary) hypertension: Secondary | ICD-10-CM | POA: Diagnosis not present

## 2015-02-10 DIAGNOSIS — R739 Hyperglycemia, unspecified: Secondary | ICD-10-CM

## 2015-02-10 DIAGNOSIS — R600 Localized edema: Secondary | ICD-10-CM

## 2015-02-10 DIAGNOSIS — Z780 Asymptomatic menopausal state: Secondary | ICD-10-CM

## 2015-02-10 LAB — CBC
HCT: 40 % (ref 36.0–46.0)
Hemoglobin: 13.1 g/dL (ref 12.0–15.0)
MCHC: 32.6 g/dL (ref 30.0–36.0)
MCV: 96.9 fl (ref 78.0–100.0)
PLATELETS: 202 10*3/uL (ref 150.0–400.0)
RBC: 4.13 Mil/uL (ref 3.87–5.11)
RDW: 13.2 % (ref 11.5–15.5)
WBC: 6.7 10*3/uL (ref 4.0–10.5)

## 2015-02-10 LAB — TSH: TSH: 2.43 u[IU]/mL (ref 0.35–4.50)

## 2015-02-10 LAB — SEDIMENTATION RATE: SED RATE: 11 mm/h (ref 0–22)

## 2015-02-10 LAB — HEMOGLOBIN A1C: Hgb A1c MFr Bld: 5.5 % (ref 4.6–6.5)

## 2015-02-10 NOTE — Assessment & Plan Note (Signed)
hgba1c acceptable, minimize simple carbs. Increase exercise as tolerated.  

## 2015-02-10 NOTE — Assessment & Plan Note (Signed)
Increase leafy greens, consider increased lean red meat and using cast iron cookware. Continue to monitor, report any concerns 

## 2015-02-10 NOTE — Assessment & Plan Note (Signed)
Well controlled, no changes to meds. Encouraged heart healthy diet such as the DASH diet and exercise as tolerated.  °

## 2015-02-10 NOTE — Assessment & Plan Note (Signed)
On Levothyroxine, continue to monitor 

## 2015-02-10 NOTE — Progress Notes (Signed)
Subjective:    Patient ID: Alyssa Williamson, female    DOB: 1932/01/02, 81 y.o.   MRN: [id number]  Chief Complaint  Patient presents with  . Follow-up    HPI Patient is in today for follow-up. Feeling well. Has just returned from a [place] and enjoyed her trip. Had no illness or concerns during the void. No recent illness. Denies any fevers or acute concerns. Denies CP/palp/SOB/HA/congestion/fevers/GI or GU c/o. Taking meds as prescribed  Past Medical History  Diagnosis Date  . Diverticulitis   . Arthritis     bilateral knee  . Hypothyroid   . Allergic rhinitis   . Heart murmur   . Hyperlipidemia   . Hypertension   . PONV (postoperative nausea and vomiting)   . Anxiety   . Anemia   . Left knee DJD   . Medicare annual wellness visit, subsequent 10/30/2013  . Cervical cancer screening 10/30/2013    Menarche at 15 Regular and moderate flow No history of abnormal pap in past G4P3, s/p 3 svd and 1 MC No history of abnormal MGM Noconcerns today No gyn surgeries    Past Surgical History  Procedure Laterality Date  . Replacement total knee  10/2010    right knee-[facility]   . Bladder tack  04/2010    Dr Dinah Beers  . Abdominal hysterectomy    . Tonsillectomy    . Joint replacement Right   . Ganglion cyst excision    . Total knee arthroplasty Left 07/26/2012    Procedure: TOTAL KNEE ARTHROPLASTY;  Surgeon: Ninetta Lights, MD;  Location: [facility];  Service: Orthopedics;  Laterality: Left;    Family History  Problem Relation Age of Onset  . Alcohol abuse Father   . Breast cancer Sister   . Cancer Sister     bladder  . Glaucoma Sister   . Arthritis Daughter   . Diabetes Daughter     type 2  . Cancer Daughter     brain cancer, diagnosed 39 years.agp  . Mental illness Daughter     s/p craniotomies, gamma knife for tumors. numerous shunts.  . Heart failure Mother   . Colon cancer Neg Hx   . Hypertension Neg Hx   . Cancer Brother     glioblastoma   . Glaucoma  Brother   . Diverticulosis Sister   . Cancer Sister     liver  . Cancer Brother     melanoma, mets to lung and brain and intestine  . Alcohol abuse Brother   . Cancer Brother     liver  . Glaucoma Maternal Grandfather   . Cancer Sister     breast    Social History   Social History  . Marital Status: Widowed    Spouse Name: N/A  . Number of Children: 3  . Years of Education: N/A   Occupational History  . Not on file.   Social History Main Topics  . Smoking status: Never Smoker   . Smokeless tobacco: Never Used  . Alcohol Use: Yes     Comment: rare  . Drug Use: No  . Sexual Activity: No     Comment: no dietary restrictions, lives alone   Other Topics Concern  . Not on file   Social History Narrative    Outpatient Prescriptions Prior to Visit  Medication Sig Dispense Refill  . albuterol (PROVENTIL HFA;VENTOLIN HFA) 108 (90 BASE) MCG/ACT inhaler Inhale 2 puffs into the lungs every  6 (six) hours as needed for wheezing or shortness of breath. 1 Inhaler 0  . ALPRAZolam (XANAX) 0.25 MG tablet Take 1 tablet (0.25 mg total) by mouth 2 (two) times daily as needed for anxiety or sleep. 60 tablet 1  . atorvastatin (LIPITOR) 40 MG tablet Take 1 tablet (40 mg total) by mouth daily. 30 tablet 6  . Azelastine-Fluticasone 137-50 MCG/ACT SUSP Place 1 spray into the nose daily. 23 g 3  . beclomethasone (QVAR) 40 MCG/ACT inhaler Inhale 2 puffs into the lungs 2 (two) times daily. 1 Inhaler 1  . benzonatate (TESSALON) 100 MG capsule Take 1 capsule (100 mg total) by mouth 3 (three) times daily as needed. 21 capsule 0  . citalopram (CELEXA) 20 MG tablet Take 1 tablet (20 mg total) by mouth daily. 30 tablet 6  . Cranberry 500 MG CAPS Take 500 mg by mouth daily.    . fexofenadine (ALLEGRA ODT) 30 MG disintegrating tablet Take 1 tablet (30 mg total) by mouth daily. 30 tablet 1  . furosemide (LASIX) 40 MG tablet Take 20 mg by mouth daily.   0  . levothyroxine (SYNTHROID, LEVOTHROID) 75 MCG  tablet Take 1 tablet (75 mcg total) by mouth daily. 30 tablet 3  . montelukast (SINGULAIR) 10 MG tablet Take 1 tablet (10 mg total) by mouth at bedtime as needed. 30 tablet 3  . Multiple Vitamins-Minerals (CENTRUM SILVER PO) Take 1 tablet by mouth daily.    Marland Kitchen trimethoprim (TRIMPEX) 100 MG tablet Take 100 mg by mouth daily.   5  . verapamil (CALAN-SR) 180 MG CR tablet TAKE 1 TABLET BY MOUTH EVERY EVENING AT BEDTIME 30 tablet 6  . doxycycline (VIBRA-TABS) 100 MG tablet Take 1 tablet (100 mg total) by mouth 2 (two) times daily. 14 tablet 0   Facility-Administered Medications Prior to Visit  Medication Dose Route Frequency Provider Last Rate Last Dose  . pneumococcal 13-valent conjugate vaccine (PREVNAR 13) injection 0.5 mL  0.5 mL Intramuscular Once [name], MD        Allergies  Allergen Reactions  . Codeine Nausea And Vomiting    Review of Systems  Constitutional: Negative for fever and malaise/fatigue.  HENT: Negative for congestion.   Eyes: Negative for discharge.  Respiratory: Negative for shortness of breath.   Cardiovascular: Negative for chest pain, palpitations and leg swelling.  Gastrointestinal: Negative for nausea and abdominal pain.  Genitourinary: Negative for dysuria.  Musculoskeletal: Negative for falls.  Skin: Negative for rash.  Neurological: Negative for loss of consciousness and headaches.  Endo/Heme/Allergies: Negative for environmental allergies.  Psychiatric/Behavioral: Negative for depression. The patient is not nervous/anxious.        Objective:    Physical Exam  Constitutional: She is oriented to person, place, and time. She appears well-developed and well-nourished. No distress.  HENT:  Head: Normocephalic and atraumatic.  Nose: Nose normal.  Eyes: Right eye exhibits no discharge. Left eye exhibits no discharge.  Neck: Normal range of motion. Neck supple.  Cardiovascular: Normal rate and regular rhythm.   No murmur heard. Pulmonary/Chest:  Effort normal and breath sounds normal.  Abdominal: Soft. Bowel sounds are normal. There is no tenderness.  Musculoskeletal: She exhibits no edema.  Neurological: She is alert and oriented to person, place, and time.  Skin: Skin is warm and dry.  Psychiatric: She has a normal mood and affect.  Nursing note and vitals reviewed.   BP 142/64 mmHg  Pulse 60  Temp(Src) 97.6 F (36.4 C) (Oral)  Ht  5' 2"  (1.575 m)  Wt 178 lb 2 oz (80.797 kg)  BMI 32.57 kg/m2  SpO2 93% Wt Readings from Last 3 Encounters:  02/10/15 178 lb 2 oz (80.797 kg)  11/11/14 178 lb 2 oz (80.797 kg)  08/06/14 172 lb 9.6 oz (78.291 kg)     Lab Results  Component Value Date   WBC 6.7 02/10/2015   HGB 13.1 02/10/2015   HCT 40.0 02/10/2015   PLT 202.0 02/10/2015   GLUCOSE 83 02/10/2015   CHOL 136 11/11/2014   TRIG 66.0 11/11/2014   HDL 63.60 11/11/2014   LDLCALC 59 11/11/2014   ALT 34 02/10/2015   AST 23 02/10/2015   NA 137 02/10/2015   K 5.1 02/10/2015   CL 101 02/10/2015   CREATININE 0.90 02/10/2015   BUN 13 02/10/2015   CO2 30 02/10/2015   TSH 2.43 02/10/2015   INR 0.99 07/24/2012   HGBA1C 5.5 02/10/2015    Lab Results  Component Value Date   TSH 2.43 02/10/2015   Lab Results  Component Value Date   WBC 6.7 02/10/2015   HGB 13.1 02/10/2015   HCT 40.0 02/10/2015   MCV 96.9 02/10/2015   PLT 202.0 02/10/2015   Lab Results  Component Value Date   NA 137 02/10/2015   K 5.1 02/10/2015   CO2 30 02/10/2015   GLUCOSE 83 02/10/2015   BUN 13 02/10/2015   CREATININE 0.90 02/10/2015   BILITOT 1.0 02/10/2015   ALKPHOS 60 02/10/2015   AST 23 02/10/2015   ALT 34 02/10/2015   PROT 6.7 02/10/2015   ALBUMIN 2.7* 02/10/2015   CALCIUM 9.4 02/10/2015   GFR 63.73 02/10/2015   Lab Results  Component Value Date   CHOL 136 11/11/2014   Lab Results  Component Value Date   HDL 63.60 11/11/2014   Lab Results  Component Value Date   LDLCALC 59 11/11/2014   Lab Results  Component Value Date    TRIG 66.0 11/11/2014   Lab Results  Component Value Date   CHOLHDL 2 11/11/2014   Lab Results  Component Value Date   HGBA1C 5.5 02/10/2015       Assessment & Plan:   Problem List Items Addressed This Visit    Anemia    Increase leafy greens, consider increased lean red meat and using cast iron cookware. Continue to monitor, report any concerns      Relevant Orders   TSH (Completed)   CBC (Completed)   Comprehensive metabolic panel (Completed)   Hemoglobin A1c (Completed)   Sed Rate (ESR) (Completed)   HTN (hypertension) - Primary    Well controlled, no changes to meds. Encouraged heart healthy diet such as the DASH diet and exercise as tolerated.       Relevant Orders   TSH (Completed)   CBC (Completed)   Comprehensive metabolic panel (Completed)   Hemoglobin A1c (Completed)   Sed Rate (ESR) (Completed)   Hyperglycemia    hgba1c acceptable, minimize simple carbs. Increase exercise as tolerated.      Relevant Orders   TSH (Completed)   CBC (Completed)   Comprehensive metabolic panel (Completed)   Hemoglobin A1c (Completed)   Sed Rate (ESR) (Completed)   Hyperlipidemia    Tolerating statin, encouraged heart healthy diet, avoid trans fats, minimize simple carbs and saturated fats. Increase exercise as tolerated      Relevant Orders   TSH (Completed)   CBC (Completed)   Comprehensive metabolic panel (Completed)   Hemoglobin A1c (Completed)   Sed  Rate (ESR) (Completed)   Hyponatremia   Relevant Orders   TSH (Completed)   CBC (Completed)   Comprehensive metabolic panel (Completed)   Hemoglobin A1c (Completed)   Sed Rate (ESR) (Completed)   Hypothyroidism    On Levothyroxine, continue to monitor       Other Visit Diagnoses    Generalized abdominal pain        Relevant Orders    TSH (Completed)    CBC (Completed)    Comprehensive metabolic panel (Completed)    Hemoglobin A1c (Completed)    Sed Rate (ESR) (Completed)    Pedal edema        Relevant  Orders    TSH (Completed)    CBC (Completed)    Comprehensive metabolic panel (Completed)    Hemoglobin A1c (Completed)    Sed Rate (ESR) (Completed)    Postmenopausal estrogen deficiency        Relevant Orders    DG Bone Density    Need for 23-polyvalent pneumococcal polysaccharide vaccine        Relevant Orders    Pneumococcal polysaccharide vaccine 23-valent greater than or equal to 2yo subcutaneous/IM (Completed)       I have discontinued Ms. Lentz's doxycycline. I am also having her maintain her Multiple Vitamins-Minerals (CENTRUM SILVER PO), furosemide, trimethoprim, Cranberry, albuterol, benzonatate, beclomethasone, atorvastatin, verapamil, levothyroxine, montelukast, fexofenadine, Azelastine-Fluticasone, citalopram, and ALPRAZolam. We will continue to administer pneumococcal 13-valent conjugate vaccine.  No orders of the defined types were placed in this encounter.     Penni Homans, MD

## 2015-02-10 NOTE — Patient Instructions (Addendum)
Consider a probiotic daily such as Digestive Advantage or [employer] colon Health  Jobst stockings on in am off in pm, light weight [id number] knee his   Basic Carbohydrate Counting for Diabetes Mellitus Carbohydrate counting is a method for keeping track of the amount of carbohydrates you eat. Eating carbohydrates naturally increases the level of sugar (glucose) in your blood, so it is important for you to know the amount that is okay for you to have in every meal. Carbohydrate counting helps keep the level of glucose in your blood within normal limits. The amount of carbohydrates allowed is different for every person. A dietitian can help you calculate the amount that is right for you. Once you know the amount of carbohydrates you can have, you can count the carbohydrates in the foods you want to eat. Carbohydrates are found in the following foods:  Grains, such as breads and cereals.  Dried beans and soy products.  Starchy vegetables, such as potatoes, peas, and corn.  Fruit and fruit juices.  Milk and yogurt.  Sweets and snack foods, such as cake, cookies, candy, chips, soft drinks, and fruit drinks. CARBOHYDRATE COUNTING There are two ways to count the carbohydrates in your food. You can use either of the methods or a combination of both. Reading the "Nutrition Facts" on Packaged Food The "Nutrition Facts" is an area that is included on the labels of almost all packaged food and beverages in the [place]. It includes the serving size of that food or beverage and information about the nutrients in each serving of the food, including the grams (g) of carbohydrate per serving.  Decide the number of servings of this food or beverage that you will be able to eat or drink. Multiply that number of servings by the number of grams of carbohydrate that is listed on the label for that serving. The total will be the amount of carbohydrates you will be having when you eat or drink this food or  beverage. Learning Standard Serving Sizes of Food When you eat food that is not packaged or does not include "Nutrition Facts" on the label, you need to measure the servings in order to count the amount of carbohydrates.A serving of most carbohydrate-rich foods contains about 15 g of carbohydrates. The following list includes serving sizes of carbohydrate-rich foods that provide 15 g ofcarbohydrate per serving:   1 slice of bread (1 oz) or 1 six-inch tortilla.    of a hamburger bun or English muffin.  4-6 crackers.   cup unsweetened dry cereal.    cup hot cereal.   cup rice or pasta.    cup mashed potatoes or  of a large baked potato.  1 cup fresh fruit or one small piece of fruit.    cup canned or frozen fruit or fruit juice.  1 cup milk.   cup plain fat-free yogurt or yogurt sweetened with artificial sweeteners.   cup cooked dried beans or starchy vegetable, such as peas, corn, or potatoes.  Decide the number of standard-size servings that you will eat. Multiply that number of servings by 15 (the grams of carbohydrates in that serving). For example, if you eat 2 cups of strawberries, you will have eaten 2 servings and 30 g of carbohydrates (2 servings x 15 g = 30 g). For foods such as soups and casseroles, in which more than one food is mixed in, you will need to count the carbohydrates in each food that is included. EXAMPLE OF CARBOHYDRATE COUNTING  Sample Dinner  3 oz chicken breast.   cup of brown rice.   cup of corn.  1 cup milk.   1 cup strawberries with sugar-free whipped topping.  Carbohydrate Calculation Step 1: Identify the foods that contain carbohydrates:   Rice.   Corn.   Milk.   Strawberries. Step 2:Calculate the number of servings eaten of each:   2 servings of rice.   1 serving of corn.   1 serving of milk.   1 serving of strawberries. Step 3: Multiply each of those number of servings by 15 g:   2 servings of  rice x 15 g = 30 g.   1 serving of corn x 15 g = 15 g.   1 serving of milk x 15 g = 15 g.   1 serving of strawberries x 15 g = 15 g. Step 4: Add together all of the amounts to find the total grams of carbohydrates eaten: 30 g + 15 g + 15 g + 15 g = 75 g.   This information is not intended to replace advice given to you by your health care provider. Make sure you discuss any questions you have with your health care provider.   Document Released: 02/08/2005 Document Revised: 03/01/2014 Document Reviewed: 01/05/2013 Elsevier Interactive Patient Education 2016 Elsevier Inc. Minimize sodium   Edema Edema is an abnormal buildup of fluids in your bodytissues. Edema is somewhatdependent on gravity to pull the fluid to the lowest place in your body. That makes the condition more common in the legs and thighs (lower extremities). Painless swelling of the feet and ankles is common and becomes more likely as you get older. It is also common in looser tissues, like around your eyes.  When the affected area is squeezed, the fluid may move out of that spot and leave a dent for a few moments. This dent is called pitting.  CAUSES  There are many possible causes of edema. Eating too much salt and being on your feet or sitting for a long time can cause edema in your legs and ankles. Hot weather may make edema worse. Common medical causes of edema include:  Heart failure.  Liver disease.  Kidney disease.  Weak blood vessels in your legs.  Cancer.  An injury.  Pregnancy.  Some medications.  Obesity. SYMPTOMS  Edema is usually painless.Your skin may look swollen or shiny.  DIAGNOSIS  Your health care provider may be able to diagnose edema by asking about your medical history and doing a physical exam. You may need to have tests such as X-rays, an electrocardiogram, or blood tests to check for medical conditions that may cause edema.  TREATMENT  Edema treatment depends on the cause. If  you have heart, liver, or kidney disease, you need the treatment appropriate for these conditions. General treatment may include:  Elevation of the affected body part above the level of your heart.  Compression of the affected body part. Pressure from elastic bandages or support stockings squeezes the tissues and forces fluid back into the blood vessels. This keeps fluid from entering the tissues.  Restriction of fluid and salt intake.  Use of a water pill (diuretic). These medications are appropriate only for some types of edema. They pull fluid out of your body and make you urinate more often. This gets rid of fluid and reduces swelling, but diuretics can have side effects. Only use diuretics as directed by your health care provider. HOME CARE INSTRUCTIONS  Keep the affected body part above the level of your heart when you are lying down.   Do not sit still or stand for prolonged periods.   Do not put anything directly under your knees when lying down.  Do not wear constricting clothing or garters on your upper legs.   Exercise your legs to work the fluid back into your blood vessels. This may help the swelling go down.   Wear elastic bandages or support stockings to reduce ankle swelling as directed by your health care provider.   Eat a low-salt diet to reduce fluid if your health care provider recommends it.   Only take medicines as directed by your health care provider. SEEK MEDICAL CARE IF:   Your edema is not responding to treatment.  You have heart, liver, or kidney disease and notice symptoms of edema.  You have edema in your legs that does not improve after elevating them.   You have sudden and unexplained weight gain. SEEK IMMEDIATE MEDICAL CARE IF:   You develop shortness of breath or chest pain.   You cannot breathe when you lie down.  You develop pain, redness, or warmth in the swollen areas.   You have heart, liver, or kidney disease and suddenly  get edema.  You have a fever and your symptoms suddenly get worse. MAKE SURE YOU:   Understand these instructions.  Will watch your condition.  Will get help right away if you are not doing well or get worse.   This information is not intended to replace advice given to you by your health care provider. Make sure you discuss any questions you have with your health care provider.   Document Released: 02/08/2005 Document Revised: 03/01/2014 Document Reviewed: 12/01/2012 Elsevier Interactive Patient Education [employer]

## 2015-02-10 NOTE — Assessment & Plan Note (Signed)
Tolerating statin, encouraged heart healthy diet, avoid trans fats, minimize simple carbs and saturated fats. Increase exercise as tolerated 

## 2015-02-18 LAB — COMPREHENSIVE METABOLIC PANEL
ALBUMIN: 3.8 g/dL (ref 3.5–5.2)
ALK PHOS: 60 U/L (ref 39–117)
ALT: 34 U/L (ref 0–35)
AST: 23 U/L (ref 0–37)
BILIRUBIN TOTAL: 1 mg/dL (ref 0.2–1.2)
BUN: 13 mg/dL (ref 6–23)
CALCIUM: 9.4 mg/dL (ref 8.4–10.5)
CO2: 30 mEq/L (ref 19–32)
CREATININE: 0.9 mg/dL (ref 0.40–1.20)
Chloride: 101 mEq/L (ref 96–112)
GFR: 63.73 mL/min (ref >60.00)
Glucose, Bld: 83 mg/dL (ref 70–99)
Potassium: 5.1 mEq/L (ref 3.5–5.1)
Sodium: 137 mEq/L (ref 135–145)
TOTAL PROTEIN: 6.7 g/dL (ref 6.0–8.3)

## 2015-02-27 ENCOUNTER — Other Ambulatory Visit: Payer: Self-pay | Admitting: Family Medicine

## 2015-03-04 ENCOUNTER — Other Ambulatory Visit: Payer: Self-pay | Admitting: Family Medicine

## 2015-03-20 ENCOUNTER — Encounter: Payer: Self-pay | Admitting: Medical

## 2015-03-20 ENCOUNTER — Ambulatory Visit (INDEPENDENT_AMBULATORY_CARE_PROVIDER_SITE_OTHER): Payer: 59 | Admitting: Medical

## 2015-03-20 VITALS — BP 128/86 | HR 88 | Temp 97.8°F | Ht 62.0 in | Wt 182.0 lb

## 2015-03-20 DIAGNOSIS — R1084 Generalized abdominal pain: Secondary | ICD-10-CM | POA: Diagnosis not present

## 2015-03-20 DIAGNOSIS — F411 Generalized anxiety disorder: Secondary | ICD-10-CM | POA: Diagnosis not present

## 2015-03-20 LAB — POC URINALSYSI DIPSTICK (AUTOMATED)
Bilirubin, UA: NEGATIVE
Blood, UA: NEGATIVE
GLUCOSE UA: NEGATIVE
Ketones, UA: NEGATIVE
Leukocytes, UA: NEGATIVE
NITRITE UA: NEGATIVE
PH UA: 6.5
Protein, UA: NEGATIVE
SPEC GRAV UA: 1.01
UROBILINOGEN UA: 0.2

## 2015-03-20 MED ORDER — RANITIDINE HCL 150 MG PO CAPS: 150.0000 mg | ORAL_CAPSULE | Freq: Two times a day (BID) | ORAL | Status: DC

## 2015-03-20 NOTE — Progress Notes (Signed)
Subjective:    Patient ID: Alyssa Williamson, female    DOB: 09-Jul-1931, 82 y.o.   MRN: [id number]  HPI   Pt in feeling ill with some stress with some family issues.  Issues with both daughters and grandson.  Pt using some xanax for anxiety.  Most stressful situation is that grandson got arrested for child abuse and human trafficking.   Pt feels some abdomen symptoms x 10 days. Describes some bloated sensation and some intermittent faint pain. So pt atributes that her pain may be related to her anxiety. No pain with eating. Some mild reflux. No uti signs or symptoms.  Last colonsocpy done at age 46 yo and told good and does not need repeat.     Review of Systems  HENT: Negative for congestion, ear pain, facial swelling, mouth sores, nosebleeds, postnasal drip, sinus pressure, sneezing and trouble swallowing.   Respiratory: Negative for cough, chest tightness, shortness of breath and wheezing.   Gastrointestinal: Positive for abdominal pain and abdominal distention. Negative for nausea, vomiting, diarrhea, constipation, blood in stool, anal bleeding and rectal pain.  Neurological: Negative for dizziness, seizures, syncope, speech difficulty, weakness, light-headedness and headaches.  Hematological: Negative for adenopathy. Does not bruise/bleed easily.  Psychiatric/Behavioral: Negative for behavioral problems, confusion and dysphoric mood. The patient is nervous/anxious.        Past Medical History  Diagnosis Date  . Diverticulitis   . Arthritis     bilateral knee  . Hypothyroid   . Allergic rhinitis   . Heart murmur   . Hyperlipidemia   . Hypertension   . PONV (postoperative nausea and vomiting)   . Anxiety   . Anemia   . Left knee DJD   . Medicare annual wellness visit, subsequent 10/30/2013  . Cervical cancer screening 10/30/2013    Menarche at 15 Regular and moderate flow No history of abnormal pap in past G4P3, s/p 3 svd and 1 MC No history of abnormal MGM Noconcerns  today No gyn surgeries    Social History   Social History  . Marital Status: Widowed    Spouse Name: N/A  . Number of Children: 3  . Years of Education: N/A   Occupational History  . Not on file.   Social History Main Topics  . Smoking status: Never Smoker   . Smokeless tobacco: Never Used  . Alcohol Use: Yes     Comment: rare  . Drug Use: No  . Sexual Activity: No     Comment: no dietary restrictions, lives alone   Other Topics Concern  . Not on file   Social History Narrative    Past Surgical History  Procedure Laterality Date  . Replacement total knee  10/2010    right knee-[facility]   . Bladder tack  04/2010    Dr Marciano Sequin  . Abdominal hysterectomy    . Tonsillectomy    . Joint replacement Right   . Ganglion cyst excision    . Total knee arthroplasty Left 07/26/2012    Procedure: TOTAL KNEE ARTHROPLASTY;  Surgeon: Loreta Ave, MD;  Location: [facility] OR;  Service: Orthopedics;  Laterality: Left;    Family History  Problem Relation Age of Onset  . Alcohol abuse Father   . Breast cancer Sister   . Cancer Sister     bladder  . Glaucoma Sister   . Arthritis Daughter   . Diabetes Daughter     type 2  . Cancer Daughter  brain cancer, diagnosed 29 years.agp  . Mental illness Daughter     s/p craniotomies, gamma knife for tumors. numerous shunts.  . Heart failure Mother   . Colon cancer Neg Hx   . Hypertension Neg Hx   . Cancer Brother     glioblastoma   . Glaucoma Brother   . Diverticulosis Sister   . Cancer Sister     liver  . Cancer Brother     melanoma, mets to lung and brain and intestine  . Alcohol abuse Brother   . Cancer Brother     liver  . Glaucoma Maternal Grandfather   . Cancer Sister     breast    Allergies  Allergen Reactions  . Codeine Nausea And Vomiting    Current Outpatient Prescriptions on File Prior to Visit  Medication Sig Dispense Refill  . albuterol (PROVENTIL HFA;VENTOLIN HFA) 108 (90 BASE) MCG/ACT inhaler Inhale  2 puffs into the lungs every 6 (six) hours as needed for wheezing or shortness of breath. 1 Inhaler 0  . ALPRAZolam (XANAX) 0.25 MG tablet Take 1 tablet (0.25 mg total) by mouth 2 (two) times daily as needed for anxiety or sleep. 60 tablet 1  . atorvastatin (LIPITOR) 40 MG tablet Take 1 tablet (40 mg total) by mouth daily. 30 tablet 6  . beclomethasone (QVAR) 40 MCG/ACT inhaler Inhale 2 puffs into the lungs 2 (two) times daily. 1 Inhaler 1  . citalopram (CELEXA) 20 MG tablet Take 1 tablet (20 mg total) by mouth daily. 30 tablet 6  . Cranberry 500 MG CAPS Take 500 mg by mouth daily.    . fexofenadine (ALLEGRA ODT) 30 MG disintegrating tablet Take 1 tablet (30 mg total) by mouth daily. 30 tablet 1  . levothyroxine (SYNTHROID, LEVOTHROID) 75 MCG tablet TAKE 1 TABLET(75 MCG) BY MOUTH DAILY 30 tablet 5  . Multiple Vitamins-Minerals (CENTRUM SILVER PO) Take 1 tablet by mouth daily.    . verapamil (CALAN-SR) 180 MG CR tablet TAKE 1 TABLET BY MOUTH EVERY EVENING AT BEDTIME 30 tablet 6   Current Facility-Administered Medications on File Prior to Visit  Medication Dose Route Frequency Provider Last Rate Last Dose  . pneumococcal 13-valent conjugate vaccine (PREVNAR 13) injection 0.5 mL  0.5 mL Intramuscular Once Bradd Canary, MD        BP 128/86 mmHg  Pulse 88  Temp(Src) 97.8 F (36.6 C) (Oral)  Ht  (1.575 m)  Wt 182 lb (82.555 kg)  BMI 33.28 kg/m2  SpO2 98%      Objective:   Physical Exam  General Appearance- Not in acute distress.  HEENT Eyes- Scleraeral/Conjuntiva-bilat- Not Yellow. Mouth & Throat- Normal.  Chest and Lung Exam Auscultation: Breath sounds:-Normal. Adventitious sounds:- No Adventitious sounds.  Cardiovascular Auscultation:Rythm - Regular. Heart Sounds -Normal heart sounds.  Abdomen Inspection:-Inspection Normal.  Palpation/Perucssion: Palpation and Percussion of the abdomen reveal- Non Tender, No Rebound tenderness, No rigidity(Guarding) and No Palpable  abdominal masses.  Liver:-Normal.  Spleen:- Normal.    Neurologic Cranial Nerve exam:- CN III-XII intact(No nystagmus), symmetric smile. Strength:- 5/5 equal and symmetric strength both upper and lower extremities.      Assessment & Plan:  For your abdomen pain which appears mild will get labs and give ranitidine to see if this helps.  If your abdomen pain worsens or changes/severe then ED evaluation.  For anxiety and severe stress would recommend continue xanax and get back on your celexa every day.  Follow up in 7-10 days or as  needed.

## 2015-03-20 NOTE — Progress Notes (Signed)
Pre visit review using our clinic review tool, if applicable. No additional management support is needed unless otherwise documented below in the visit note. 

## 2015-03-20 NOTE — Patient Instructions (Signed)
For your abdomen pain which appears mild will get labs and give ranitidine to see if this helps.  If your abdomen pain worsens or changes/severe then ED evaluation.  For anxiety and severe stress would recommend continue xanax and get back on your celexa every day.  Follow up in 7-10 days or as needed.

## 2015-03-21 LAB — CBC WITH DIFFERENTIAL/PLATELET
BASOS PCT: 0.4 % (ref 0.0–3.0)
Basophils Absolute: 0 10*3/uL (ref 0.0–0.1)
EOS PCT: 4.2 % (ref 0.0–5.0)
Eosinophils Absolute: 0.3 10*3/uL (ref 0.0–0.7)
HEMATOCRIT: 38.2 % (ref 36.0–46.0)
Hemoglobin: 12.9 g/dL (ref 12.0–15.0)
Lymphocytes Relative: 27.2 % (ref 12.0–46.0)
Lymphs Abs: 1.7 10*3/uL (ref 0.7–4.0)
MCHC: 33.6 g/dL (ref 30.0–36.0)
MCV: 94.3 fl (ref 78.0–100.0)
Monocytes Absolute: 0.7 10*3/uL (ref 0.1–1.0)
Monocytes Relative: 10.3 % (ref 3.0–12.0)
Neutro Abs: 3.7 10*3/uL (ref 1.4–7.7)
Neutrophils Relative %: 57.9 % (ref 43.0–77.0)
Platelets: 196 10*3/uL (ref 150.0–400.0)
RBC: 4.05 Mil/uL (ref 3.87–5.11)
RDW: 13.1 % (ref 11.5–15.5)
WBC: 6.4 10*3/uL (ref 4.0–10.5)

## 2015-03-21 LAB — COMPREHENSIVE METABOLIC PANEL
ALK PHOS: 71 U/L (ref 39–117)
ALT: 32 U/L (ref 0–35)
AST: 27 U/L (ref 0–37)
Albumin: 3.6 g/dL (ref 3.5–5.2)
BUN: 12 mg/dL (ref 6–23)
CALCIUM: 9 mg/dL (ref 8.4–10.5)
CHLORIDE: 103 meq/L (ref 96–112)
CO2: 30 mEq/L (ref 19–32)
CREATININE: 0.92 mg/dL (ref 0.40–1.20)
GFR: 62.12 mL/min (ref >60.00)
Glucose, Bld: 83 mg/dL (ref 70–99)
POTASSIUM: 4.5 meq/L (ref 3.5–5.1)
SODIUM: 139 meq/L (ref 135–145)
TOTAL PROTEIN: 6.5 g/dL (ref 6.0–8.3)
Total Bilirubin: 0.8 mg/dL (ref 0.2–1.2)

## 2015-03-21 LAB — AMYLASE: AMYLASE: 35 U/L (ref 27–131)

## 2015-03-21 LAB — LIPASE: LIPASE: 16 U/L (ref 11.0–59.0)

## 2015-04-02 ENCOUNTER — Other Ambulatory Visit: Payer: Self-pay | Admitting: Family Medicine

## 2015-04-02 ENCOUNTER — Other Ambulatory Visit (INDEPENDENT_AMBULATORY_CARE_PROVIDER_SITE_OTHER): Payer: 59

## 2015-04-02 DIAGNOSIS — R1084 Generalized abdominal pain: Secondary | ICD-10-CM | POA: Diagnosis not present

## 2015-04-02 NOTE — Addendum Note (Signed)
Addended by: Eustace Quail on: 04/02/2015 02:16 PM   Modules accepted: Orders

## 2015-04-03 ENCOUNTER — Other Ambulatory Visit: Payer: Self-pay | Admitting: Family Medicine

## 2015-04-03 LAB — FECAL OCCULT BLOOD, IMMUNOCHEMICAL: FECAL OCCULT BLD: NEGATIVE

## 2015-04-03 MED ORDER — ATORVASTATIN CALCIUM 40 MG PO TABS
40.0000 mg | ORAL_TABLET | Freq: Every day | ORAL | Status: DC
Start: 2015-04-03 — End: 2015-11-06

## 2015-04-03 MED ORDER — VERAPAMIL HCL ER 180 MG PO TBCR: EXTENDED_RELEASE_TABLET | ORAL | Status: DC

## 2015-04-06 IMAGING — CR DG KNEE 1-2V*L*
2 series · 2 of 2 positions shown · non-contrast
Comparison: None.

CLINICAL DATA: Postoperative radiograph; status post left total
knee arthroplasty.

LEFT KNEE - 1-2 VIEW

[view not recorded (1 of 2)]
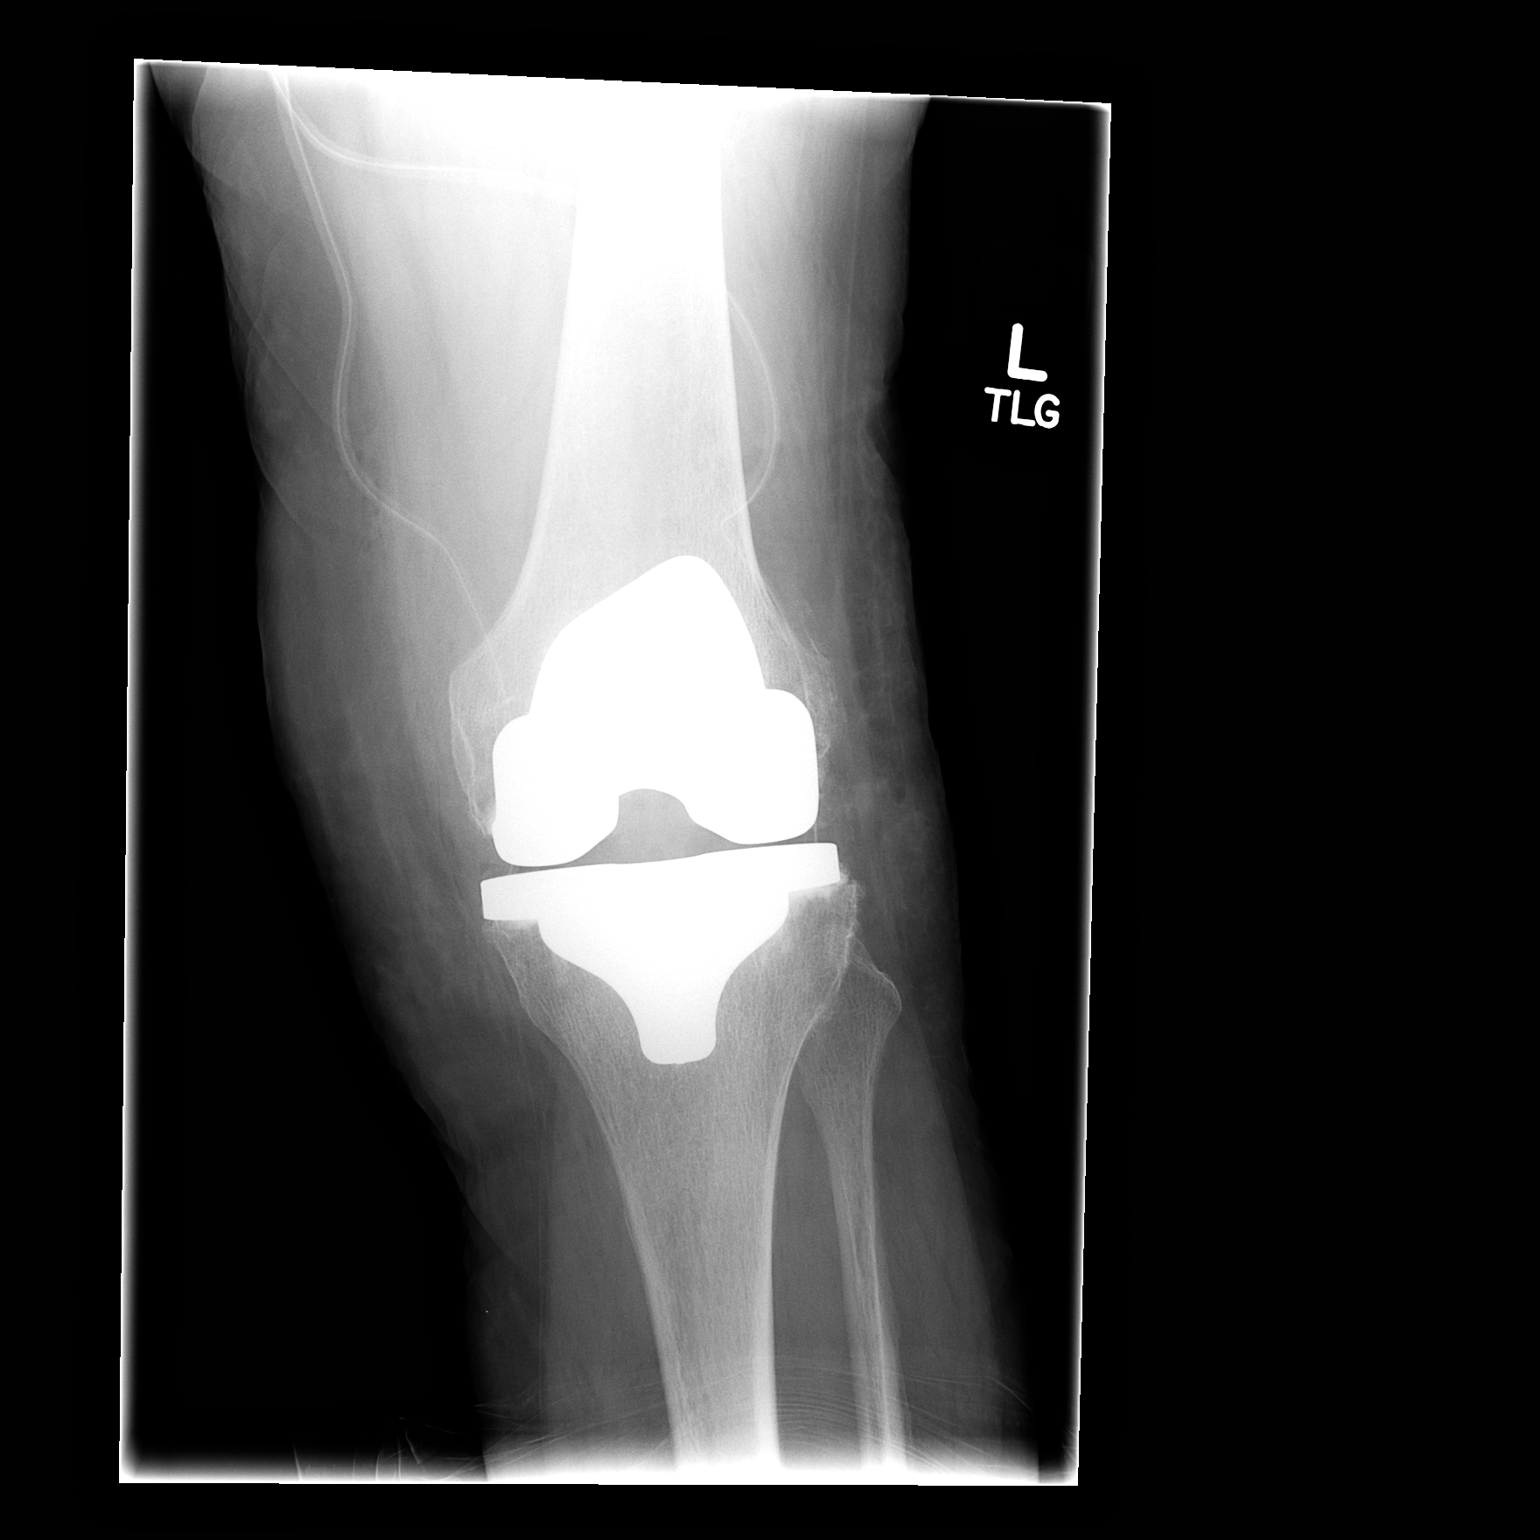

[view not recorded (2 of 2)]
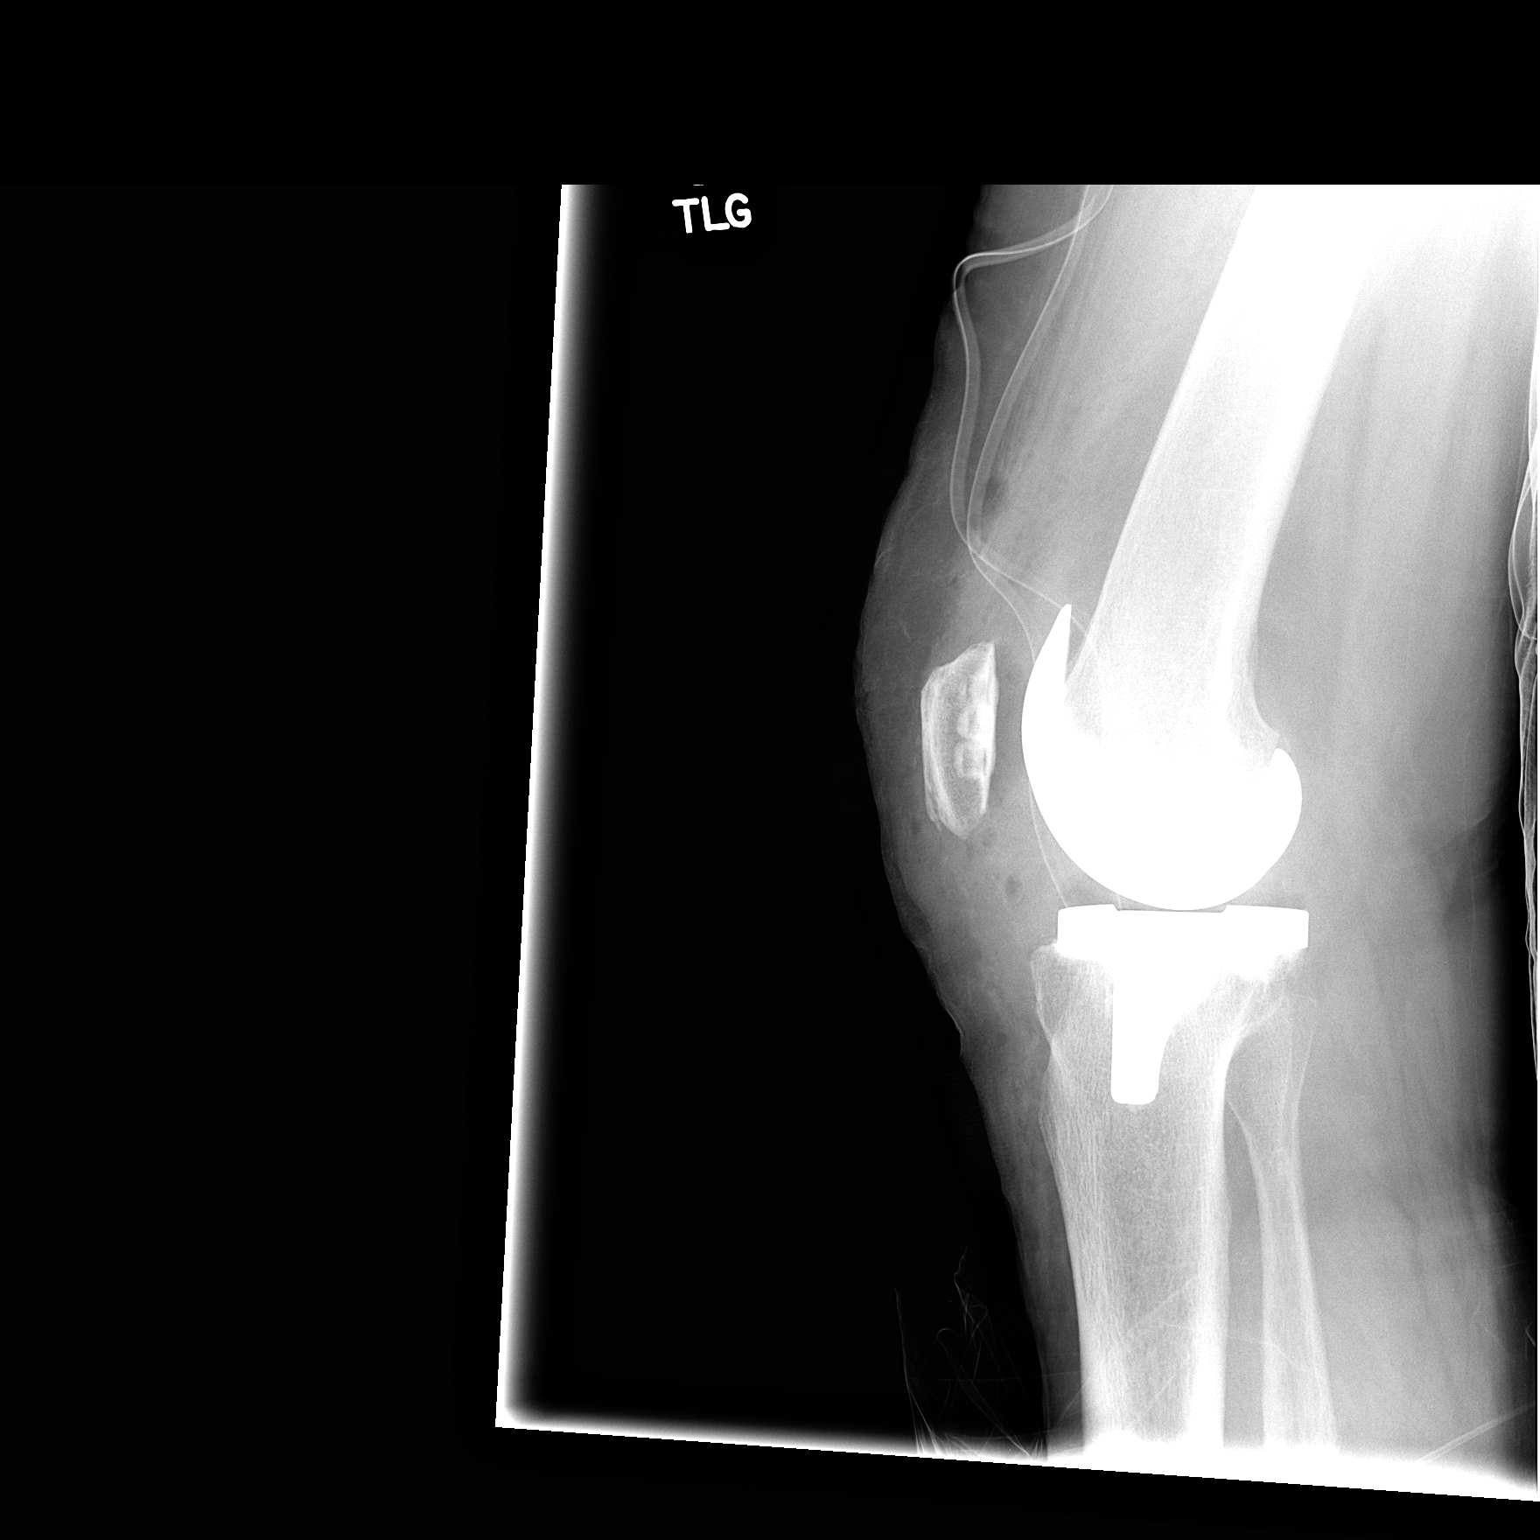

[2 of 2 positions shown; findings below may reference images not displayed]

FINDINGS: The patient's left knee prosthesis is grossly
unremarkable in appearance, without evidence of loosening.  No
fracture is seen.  The patellofemoral component is grossly
unremarkable.

No significant joint effusion is identified.  Edema is noted within
Hoffa's fat pad.  Scattered postoperative soft tissue air is seen.
An overlying drain is noted.
IMPRESSION: Left knee prosthesis is grossly unremarkable in appearance, without
evidence of loosening.  No evidence of fracture.

## 2015-04-15 ENCOUNTER — Other Ambulatory Visit: Payer: Self-pay | Admitting: Family Medicine

## 2015-04-15 DIAGNOSIS — I10 Essential (primary) hypertension: Secondary | ICD-10-CM

## 2015-04-15 DIAGNOSIS — E785 Hyperlipidemia, unspecified: Secondary | ICD-10-CM

## 2015-04-15 MED ORDER — ALPRAZOLAM 0.25 MG PO TABS: 0.2500 mg | ORAL_TABLET | Freq: Two times a day (BID) | ORAL | Status: DC | PRN

## 2015-04-15 NOTE — Telephone Encounter (Signed)
Requesting: alprazolam Contract signed on 05/09/2014 UDS  Low risk Last OV   02/10/2015 Last Refill   #[id number] with 1 refill on 02/03/2015 [facility] [address] [place]  Please Advise

## 2015-04-15 NOTE — Telephone Encounter (Signed)
Faxed hardcopy for Alprazolam to [facility] [place] 

## 2015-04-20 ENCOUNTER — Other Ambulatory Visit: Payer: Self-pay | Admitting: Medical

## 2015-05-03 ENCOUNTER — Other Ambulatory Visit: Payer: Self-pay | Admitting: Family Medicine

## 2015-06-13 ENCOUNTER — Telehealth: Payer: Self-pay | Admitting: Family Medicine

## 2015-06-13 ENCOUNTER — Ambulatory Visit: Payer: Medicare Other | Admitting: Family Medicine

## 2015-06-16 ENCOUNTER — Encounter: Payer: Self-pay | Admitting: Family Medicine

## 2015-06-16 NOTE — Telephone Encounter (Signed)
No charge. 

## 2015-06-16 NOTE — Telephone Encounter (Signed)
Pt was no show 06/13/15 10:15am for f/u appt, pt has not rescheduled, 1st no show since 2015, charge or no charge?

## 2015-06-16 NOTE — Telephone Encounter (Signed)
Waiving fee, mailing reminder letter °

## 2015-06-25 ENCOUNTER — Ambulatory Visit (INDEPENDENT_AMBULATORY_CARE_PROVIDER_SITE_OTHER): Payer: Medicare Other | Admitting: Physician Assistant

## 2015-06-25 ENCOUNTER — Encounter: Payer: Self-pay | Admitting: Physician Assistant

## 2015-06-25 VITALS — BP 140/68 | HR 55 | Temp 98.1°F | Resp 16 | Ht 62.0 in | Wt 174.5 lb

## 2015-06-25 DIAGNOSIS — R399 Unspecified symptoms and signs involving the genitourinary system: Secondary | ICD-10-CM

## 2015-06-25 DIAGNOSIS — R350 Frequency of micturition: Secondary | ICD-10-CM | POA: Diagnosis not present

## 2015-06-25 LAB — POCT URINALYSIS DIPSTICK
Bilirubin, UA: NEGATIVE
Blood, UA: POSITIVE
Glucose, UA: NEGATIVE
Ketones, UA: NEGATIVE
Nitrite, UA: NEGATIVE
Protein, UA: NEGATIVE
Spec Grav, UA: 1.015
Urobilinogen, UA: 0.2
pH, UA: 6

## 2015-06-25 MED ORDER — CEPHALEXIN 500 MG PO CAPS: 500.0000 mg | ORAL_CAPSULE | Freq: Two times a day (BID) | ORAL | Status: DC

## 2015-06-25 NOTE — Progress Notes (Signed)
Patient presents to clinic today c/o urinary urgency x 3-4 days. Endorses suprapubic pressurhie but denies pain. Denies blood in the urine, nausea/vomiting, fever. Has history of UTI. Is followed by Urology but has not seen in some time.   Past Medical History  Diagnosis Date  . Diverticulitis   . Arthritis     bilateral knee  . Hypothyroid   . Allergic rhinitis   . Heart murmur   . Hyperlipidemia   . Hypertension   . PONV (postoperative nausea and vomiting)   . Anxiety   . Anemia   . Left knee DJD   . Medicare annual wellness visit, subsequent 10/30/2013  . Cervical cancer screening 10/30/2013    Menarche at 15 Regular and moderate flow No history of abnormal pap in past G4P3, s/p 3 svd and 1 MC No history of abnormal MGM Noconcerns today No gyn surgeries    Current Outpatient Prescriptions on File Prior to Visit  Medication Sig Dispense Refill  . albuterol (PROVENTIL HFA;VENTOLIN HFA) 108 (90 BASE) MCG/ACT inhaler Inhale 2 puffs into the lungs every 6 (six) hours as needed for wheezing or shortness of breath. 1 Inhaler 0  . ALPRAZolam (XANAX) 0.25 MG tablet Take 1 tablet (0.25 mg total) by mouth 2 (two) times daily as needed for anxiety or sleep. 60 tablet 1  . atorvastatin (LIPITOR) 40 MG tablet Take 1 tablet (40 mg total) by mouth daily. 30 tablet 6  . beclomethasone (QVAR) 40 MCG/ACT inhaler Inhale 2 puffs into the lungs 2 (two) times daily. (Patient taking differently: Inhale 2 puffs into the lungs 2 (two) times daily as needed. ) 1 Inhaler 1  . citalopram (CELEXA) 20 MG tablet Take 1 tablet (20 mg total) by mouth daily. 30 tablet 6  . Cranberry 500 MG CAPS Take 500 mg by mouth daily.    . fexofenadine (ALLEGRA ODT) 30 MG disintegrating tablet Take 1 tablet (30 mg total) by mouth daily. 30 tablet 1  . levothyroxine (SYNTHROID, LEVOTHROID) 75 MCG tablet TAKE 1 TABLET(75 MCG) BY MOUTH DAILY 30 tablet 5  . montelukast (SINGULAIR) 10 MG tablet TAKE 1 TABLET BY MOUTH AT BEDTIME AS  NEEDED 30 tablet 0  . Multiple Vitamins-Minerals (CENTRUM SILVER PO) Take 1 tablet by mouth daily.    . ranitidine (ZANTAC) 150 MG capsule Take 1 capsule (150 mg total) by mouth 2 (two) times daily. 60 capsule 0  . verapamil (CALAN-SR) 180 MG CR tablet TAKE 1 TABLET BY MOUTH EVERY EVENING AT BEDTIME 30 tablet 6   Current Facility-Administered Medications on File Prior to Visit  Medication Dose Route Frequency Provider Last Rate Last Dose  . pneumococcal 13-valent conjugate vaccine (PREVNAR 13) injection 0.5 mL  0.5 mL Intramuscular Once Bradd Canary, MD        Allergies  Allergen Reactions  . Codeine Nausea And Vomiting    Family History  Problem Relation Age of Onset  . Alcohol abuse Father   . Breast cancer Sister   . Cancer Sister     bladder  . Glaucoma Sister   . Arthritis Daughter   . Diabetes Daughter     type 2  . Cancer Daughter     brain cancer, diagnosed 29 years.agp  . Mental illness Daughter     s/p craniotomies, gamma knife for tumors. numerous shunts.  . Heart failure Mother   . Colon cancer Neg Hx   . Hypertension Neg Hx   . Cancer Brother     glioblastoma   .  Glaucoma Brother   . Diverticulosis Sister   . Cancer Sister     liver  . Cancer Brother     melanoma, mets to lung and brain and intestine  . Alcohol abuse Brother   . Cancer Brother     liver  . Glaucoma Maternal Grandfather   . Cancer Sister     breast    Social History   Social History  . Marital Status: Widowed    Spouse Name: N/A  . Number of Children: 3  . Years of Education: N/A   Social History Main Topics  . Smoking status: Never Smoker   . Smokeless tobacco: Never Used  . Alcohol Use: Yes     Comment: rare  . Drug Use: No  . Sexual Activity: No     Comment: no dietary restrictions, lives alone   Other Topics Concern  . None   Social History Narrative   Review of Systems - See HPI.  All other ROS are negative.  BP 140/68 mmHg  Pulse 55  Temp(Src) 98.1 F (36.7  C) (Oral)  Resp 16  Ht 5\' 2"  (1.575 m)  Wt 174 lb 8 oz (79.153 kg)  BMI 31.91 kg/m2  SpO2 98%   Physical Exam  Constitutional: She is oriented to person, place, and time and well-developed, well-nourished, and in no distress.  HENT:  Head: Normocephalic and atraumatic.  Eyes: Conjunctivae are normal.  Neck: Neck supple.  Cardiovascular: Normal rate, regular rhythm, normal heart sounds and intact distal pulses.   Pulmonary/Chest: Effort normal and breath sounds normal. No respiratory distress. She has no wheezes. She has no rales. She exhibits no tenderness.  Abdominal: Soft. Bowel sounds are normal. She exhibits no distension. There is no tenderness.  Neurological: She is alert and oriented to person, place, and time.  Skin: Skin is warm and dry. No rash noted.  Psychiatric: Affect normal.  Vitals reviewed.  Recent Results (from the past 2160 hour(s))  Fecal occult blood, imunochemical     Status: None   Collection Time: 04/02/15  2:37 PM  Result Value Ref Range   Fecal Occult Bld Negative Negative   Assessment/Plan: 2. Urinary frequency Urine dip with LE and blood. + history. Will begin Keflex. Urine culture sent. Supportive measures reviewed. Will alter regimen based on culture results.  - cephALEXin (KEFLEX) 500 MG capsule; Take 1 capsule (500 mg total) by mouth 2 (two) times daily.  Dispense: 14 capsule; Refill: 0

## 2015-06-25 NOTE — Progress Notes (Signed)
Pre visit review using our clinic review tool, if applicable. No additional management support is needed unless otherwise documented below in the visit note/SLS  

## 2015-06-25 NOTE — Patient Instructions (Signed)
Your symptoms are consistent with a bladder infection, also called acute cystitis. Please take your antibiotic (Keflex) as directed until all pills are gone.  Stay very well hydrated.  Consider a daily probiotic (Align, Culturelle, or Activia) to help prevent stomach upset caused by the antibiotic.  Taking a probiotic daily may also help prevent recurrent UTIs.  Also consider taking AZO (Phenazopyridine) tablets to help decrease pain with urination.  I will call you with your urine testing results.  We will change antibiotics if indicated.  Call or return to clinic if symptoms are not resolved by completion of antibiotic.   Urinary Tract Infection A urinary tract infection (UTI) can occur any place along the urinary tract. The tract includes the kidneys, ureters, bladder, and urethra. A type of germ called bacteria often causes a UTI. UTIs are often helped with antibiotic medicine.  HOME CARE   If given, take antibiotics as told by your doctor. Finish them even if you start to feel better.  Drink enough fluids to keep your pee (urine) clear or pale yellow.  Avoid tea, drinks with caffeine, and bubbly (carbonated) drinks.  Pee often. Avoid holding your pee in for a long time.  Pee before and after having sex (intercourse).  Wipe from front to back after you poop (bowel movement) if you are a woman. Use each tissue only once. GET HELP RIGHT AWAY IF:   You have back pain.  You have lower belly (abdominal) pain.  You have chills.  You feel sick to your stomach (nauseous).  You throw up (vomit).  Your burning or discomfort with peeing does not go away.  You have a fever.  Your symptoms are not better in 3 days. MAKE SURE YOU:   Understand these instructions.  Will watch your condition.  Will get help right away if you are not doing well or get worse. Document Released: 07/28/2007 Document Revised: 11/03/2011 Document Reviewed: 09/09/2011 Exit[employer] Patient Information 2015  [employer]. This information is not intended to replace advice given to you by your health care provider. Make sure you discuss any questions you have with your health care provider.   

## 2015-06-25 NOTE — Addendum Note (Signed)
Addended by: Regis BillSCATES, SHARON L on: 06/25/2015 04:23 PM   Modules accepted: Orders

## 2015-07-07 ENCOUNTER — Ambulatory Visit: Payer: Medicare Other | Admitting: Family Medicine

## 2015-07-22 ENCOUNTER — Telehealth: Payer: Self-pay | Admitting: Family Medicine

## 2015-07-22 DIAGNOSIS — I10 Essential (primary) hypertension: Secondary | ICD-10-CM

## 2015-07-22 DIAGNOSIS — E785 Hyperlipidemia, unspecified: Secondary | ICD-10-CM

## 2015-07-22 MED ORDER — ALPRAZOLAM 0.25 MG PO TABS: 0.2500 mg | ORAL_TABLET | Freq: Two times a day (BID) | ORAL | Status: DC | PRN

## 2015-07-22 NOTE — Telephone Encounter (Signed)
Requesting:  Alprazolam Contract  05/09/2014 UDS  Low risk, due now Last OV  02/10/2015 Last Refill   #60 with 1 refill on 04/15/2015  Please Advise

## 2015-07-23 NOTE — Telephone Encounter (Signed)
Patient checking on the status of medication request °

## 2015-07-24 NOTE — Telephone Encounter (Signed)
[facility] patient notified by phone refill done. LEFT A DETAILED MESSAGE

## 2015-07-29 ENCOUNTER — Ambulatory Visit: Payer: Medicare Other | Admitting: Family Medicine

## 2015-08-12 ENCOUNTER — Encounter: Payer: Self-pay | Admitting: Family Medicine

## 2015-08-12 ENCOUNTER — Ambulatory Visit (HOSPITAL_BASED_OUTPATIENT_CLINIC_OR_DEPARTMENT_OTHER): Payer: Medicare Other

## 2015-08-12 ENCOUNTER — Ambulatory Visit (INDEPENDENT_AMBULATORY_CARE_PROVIDER_SITE_OTHER): Payer: Medicare Other | Admitting: Family Medicine

## 2015-08-12 VITALS — BP 132/70 | HR 64 | Temp 98.4°F | Ht 62.0 in | Wt 176.5 lb

## 2015-08-12 DIAGNOSIS — N390 Urinary tract infection, site not specified: Secondary | ICD-10-CM | POA: Diagnosis not present

## 2015-08-12 DIAGNOSIS — E782 Mixed hyperlipidemia: Secondary | ICD-10-CM | POA: Diagnosis not present

## 2015-08-12 DIAGNOSIS — I10 Essential (primary) hypertension: Secondary | ICD-10-CM | POA: Diagnosis not present

## 2015-08-12 DIAGNOSIS — Z1239 Encounter for other screening for malignant neoplasm of breast: Secondary | ICD-10-CM

## 2015-08-12 DIAGNOSIS — F418 Other specified anxiety disorders: Secondary | ICD-10-CM | POA: Diagnosis not present

## 2015-08-12 DIAGNOSIS — Z780 Asymptomatic menopausal state: Secondary | ICD-10-CM

## 2015-08-12 DIAGNOSIS — E039 Hypothyroidism, unspecified: Secondary | ICD-10-CM

## 2015-08-12 DIAGNOSIS — R739 Hyperglycemia, unspecified: Secondary | ICD-10-CM | POA: Diagnosis not present

## 2015-08-12 DIAGNOSIS — F32A Depression, unspecified: Secondary | ICD-10-CM

## 2015-08-12 DIAGNOSIS — F329 Major depressive disorder, single episode, unspecified: Secondary | ICD-10-CM

## 2015-08-12 DIAGNOSIS — F419 Anxiety disorder, unspecified: Principal | ICD-10-CM

## 2015-08-12 NOTE — Patient Instructions (Signed)
Hypertension Hypertension, commonly called high blood pressure, is when the force of blood pumping through your arteries is too strong. Your arteries are the blood vessels that carry blood from your heart throughout your body. A blood pressure reading consists of a higher number over a lower number, such as 110/72. The higher number (systolic) is the pressure inside your arteries when your heart pumps. The lower number (diastolic) is the pressure inside your arteries when your heart relaxes. Ideally you want your blood pressure below 120/80. Hypertension forces your heart to work harder to pump blood. Your arteries may become narrow or stiff. Having untreated or uncontrolled hypertension can cause heart attack, stroke, kidney disease, and other problems. RISK FACTORS Some risk factors for high blood pressure are controllable. Others are not.  Risk factors you cannot control include:   Race. You may be at higher risk if you are African American.  Age. Risk increases with age.  Gender. Men are at higher risk than women before age 45 years. After age 65, women are at higher risk than men. Risk factors you can control include:  Not getting enough exercise or physical activity.  Being overweight.  Getting too much fat, sugar, calories, or salt in your diet.  Drinking too much alcohol. SIGNS AND SYMPTOMS Hypertension does not usually cause signs or symptoms. Extremely high blood pressure (hypertensive crisis) may cause headache, anxiety, shortness of breath, and nosebleed. DIAGNOSIS To check if you have hypertension, your health care provider will measure your blood pressure while you are seated, with your arm held at the level of your heart. It should be measured at least twice using the same arm. Certain conditions can cause a difference in blood pressure between your right and left arms. A blood pressure reading that is higher than normal on one occasion does not mean that you need treatment. If  it is not clear whether you have high blood pressure, you may be asked to return on a different day to have your blood pressure checked again. Or, you may be asked to monitor your blood pressure at home for 1 or more weeks. TREATMENT Treating high blood pressure includes making lifestyle changes and possibly taking medicine. Living a healthy lifestyle can help lower high blood pressure. You may need to change some of your habits. Lifestyle changes may include:  Following the DASH diet. This diet is high in fruits, vegetables, and whole grains. It is low in salt, red meat, and added sugars.  Keep your sodium intake below 2,300 mg per day.  Getting at least 30-45 minutes of aerobic exercise at least 4 times per week.  Losing weight if necessary.  Not smoking.  Limiting alcoholic beverages.  Learning ways to reduce stress. Your health care provider may prescribe medicine if lifestyle changes are not enough to get your blood pressure under control, and if one of the following is true:  You are 18-59 years of age and your systolic blood pressure is above 140.  You are 60 years of age or older, and your systolic blood pressure is above 150.  Your diastolic blood pressure is above 90.  You have diabetes, and your systolic blood pressure is over 140 or your diastolic blood pressure is over 90.  You have kidney disease and your blood pressure is above 140/90.  You have heart disease and your blood pressure is above 140/90. Your personal target blood pressure may vary depending on your medical conditions, your age, and other factors. HOME CARE INSTRUCTIONS    Have your blood pressure rechecked as directed by your health care provider.   Take medicines only as directed by your health care provider. Follow the directions carefully. Blood pressure medicines must be taken as prescribed. The medicine does not work as well when you skip doses. Skipping doses also puts you at risk for  problems.  Do not smoke.   Monitor your blood pressure at home as directed by your health care provider. SEEK MEDICAL CARE IF:   You think you are having a reaction to medicines taken.  You have recurrent headaches or feel dizzy.  You have swelling in your ankles.  You have trouble with your vision. SEEK IMMEDIATE MEDICAL CARE IF:  You develop a severe headache or confusion.  You have unusual weakness, numbness, or feel faint.  You have severe chest or abdominal pain.  You vomit repeatedly.  You have trouble breathing. MAKE SURE YOU:   Understand these instructions.  Will watch your condition.  Will get help right away if you are not doing well or get worse.   This information is not intended to replace advice given to you by your health care provider. Make sure you discuss any questions you have with your health care provider.   Document Released: 02/08/2005 Document Revised: 06/25/2014 Document Reviewed: 12/01/2012 Elsevier Interactive Patient Education 2016 [employer] Inc[employer]  

## 2015-08-12 NOTE — Progress Notes (Signed)
Pre visit review using our clinic review tool, if applicable. No additional management support is needed unless otherwise documented below in the visit note. 

## 2015-08-13 LAB — COMPREHENSIVE METABOLIC PANEL
ALBUMIN: 3.8 g/dL (ref 3.5–5.2)
ALT: 19 U/L (ref 0–35)
AST: 22 U/L (ref 0–37)
Alkaline Phosphatase: 61 U/L (ref 39–117)
BILIRUBIN TOTAL: 0.7 mg/dL (ref 0.2–1.2)
BUN: 21 mg/dL (ref 6–23)
CALCIUM: 9.4 mg/dL (ref 8.4–10.5)
CHLORIDE: 103 meq/L (ref 96–112)
CO2: 31 mEq/L (ref 19–32)
Creatinine, Ser: 1.23 mg/dL — ABNORMAL HIGH (ref 0.40–1.20)
GFR: 44.39 mL/min — ABNORMAL LOW (ref >60.00)
Glucose, Bld: 89 mg/dL (ref 70–99)
Potassium: 5.2 mEq/L — ABNORMAL HIGH (ref 3.5–5.1)
SODIUM: 140 meq/L (ref 135–145)
TOTAL PROTEIN: 6.7 g/dL (ref 6.0–8.3)

## 2015-08-13 LAB — URINALYSIS, ROUTINE W REFLEX MICROSCOPIC
Bilirubin Urine: NEGATIVE
Hgb urine dipstick: NEGATIVE
KETONES UR: NEGATIVE
Nitrite: NEGATIVE
PH: 7 (ref 5.0–8.0)
RBC / HPF: NONE SEEN (ref 0–?)
Specific Gravity, Urine: <=1.005 — AB (ref 1.000–1.030)
Total Protein, Urine: NEGATIVE
URINE GLUCOSE: NEGATIVE
UROBILINOGEN UA: 0.2 (ref 0.0–1.0)

## 2015-08-13 LAB — CBC
HCT: 37.1 % (ref 36.0–46.0)
Hemoglobin: 12.4 g/dL (ref 12.0–15.0)
MCHC: 33.4 g/dL (ref 30.0–36.0)
MCV: 94.7 fl (ref 78.0–100.0)
PLATELETS: 189 10*3/uL (ref 150.0–400.0)
RBC: 3.91 Mil/uL (ref 3.87–5.11)
RDW: 13.3 % (ref 11.5–15.5)
WBC: 6.7 10*3/uL (ref 4.0–10.5)

## 2015-08-13 LAB — HEMOGLOBIN A1C: HEMOGLOBIN A1C: 5.4 % (ref 4.6–6.5)

## 2015-08-13 LAB — TSH: TSH: 0.32 u[IU]/mL — AB (ref 0.35–4.50)

## 2015-08-14 ENCOUNTER — Other Ambulatory Visit: Payer: Self-pay | Admitting: Family Medicine

## 2015-08-14 DIAGNOSIS — E875 Hyperkalemia: Secondary | ICD-10-CM

## 2015-08-14 LAB — URINE CULTURE
COLONY COUNT: NO GROWTH
ORGANISM ID, BACTERIA: NO GROWTH

## 2015-08-18 ENCOUNTER — Ambulatory Visit (HOSPITAL_BASED_OUTPATIENT_CLINIC_OR_DEPARTMENT_OTHER)
Admission: RE | Admit: 2015-08-18 | Discharge: 2015-08-18 | Disposition: A | Discharge status: Home / Self Care | Payer: Medicare Other | Source: Ambulatory Visit | Attending: Family Medicine | Admitting: Family Medicine

## 2015-08-18 DIAGNOSIS — Z1231 Encounter for screening mammogram for malignant neoplasm of breast: Secondary | ICD-10-CM | POA: Insufficient documentation

## 2015-08-18 DIAGNOSIS — M85852 Other specified disorders of bone density and structure, left thigh: Secondary | ICD-10-CM | POA: Diagnosis not present

## 2015-08-18 DIAGNOSIS — Z780 Asymptomatic menopausal state: Secondary | ICD-10-CM | POA: Diagnosis not present

## 2015-08-18 DIAGNOSIS — Z1239 Encounter for other screening for malignant neoplasm of breast: Secondary | ICD-10-CM | POA: Diagnosis not present

## 2015-08-18 DIAGNOSIS — R928 Other abnormal and inconclusive findings on diagnostic imaging of breast: Secondary | ICD-10-CM | POA: Diagnosis not present

## 2015-08-18 DIAGNOSIS — R921 Mammographic calcification found on diagnostic imaging of breast: Secondary | ICD-10-CM | POA: Insufficient documentation

## 2015-08-21 ENCOUNTER — Telehealth: Payer: Self-pay | Admitting: Family Medicine

## 2015-08-21 NOTE — Telephone Encounter (Signed)
°  Relationship to patient: Self Can be reached: [phone]   Reason for call: Patient states she is experiencing symptoms from her thyroid. STates she is feeling very fatigued

## 2015-08-21 NOTE — Telephone Encounter (Signed)
Patient informed of PCP instructions. She did understand and agreed to PCP.  Thinks she may have a virus and will call back if gets worse.

## 2015-08-21 NOTE — Telephone Encounter (Signed)
I do not believe it is her thyroid because remember TSH is low when thyroid hormone is actually hi so the only change that owuld make sense in her numbers would be to drop her her thyroid med which would actually make her more tired. If it continues to be severe she will need to come back in

## 2015-08-24 ENCOUNTER — Encounter: Payer: Self-pay | Admitting: Family Medicine

## 2015-08-24 NOTE — Assessment & Plan Note (Signed)
Referred to behavioral health. Continue Citalopram

## 2015-08-24 NOTE — Assessment & Plan Note (Signed)
minimize simple carbs. Increase exercise as tolerated.  

## 2015-08-24 NOTE — Progress Notes (Signed)
Patient ID: Alyssa ShutterGrace E Summerall, female   DOB: [birth year], 82 y.o.   MRN: [id number]   Subjective:    Patient ID: Alyssa Williamson, female    DOB: [birth year], 82 y.o.   MRN: [id number]  Chief Complaint  Patient presents with  . Follow-up    HPI Patient is in today for follow up. She is struggling with anhedonia and anxiety and worries a great deal. No recent illness but her daughter notes that she cries easily. Denies CP/palp/SOB/HA/congestion/fevers/GI or GU c/o. Taking meds as prescribed  Past Medical History  Diagnosis Date  . Diverticulitis   . Arthritis     bilateral knee  . Hypothyroid   . Allergic rhinitis   . Heart murmur   . Hyperlipidemia   . Hypertension   . PONV (postoperative nausea and vomiting)   . Anxiety   . Anemia   . Left knee DJD   . Medicare annual wellness visit, subsequent 10/30/2013  . Cervical cancer screening 10/30/2013    Menarche at 15 Regular and moderate flow No history of abnormal pap in past G4P3, s/p 3 svd and 1 MC No history of abnormal MGM Noconcerns today No gyn surgeries  . Depression with anxiety     Past Surgical History  Procedure Laterality Date  . Replacement total knee  10/2010    right knee-Murphy   . Bladder tack  04/2010    Dr Marciano Sequinimothy  [place]  . Abdominal hysterectomy    . Tonsillectomy    . Joint replacement Right   . Ganglion cyst excision    . Total knee arthroplasty Left 07/26/2012    Procedure: TOTAL KNEE ARTHROPLASTY;  Surgeon: Loreta Aveaniel F Murphy, MD;  [facility];  Service: Orthopedics;  Laterality: Left;    Family History  Problem Relation Age of Onset  . Alcohol abuse Father   . Breast cancer Sister   . Cancer Sister     bladder  . Glaucoma Sister   . Arthritis Daughter   . Diabetes Daughter     type 2  . Cancer Daughter     brain cancer, diagnosed 29 years.agp  . Mental illness Daughter     s/p craniotomies, gamma knife for tumors. numerous shunts.  . Heart failure Mother   . Colon cancer Neg Hx   . Hypertension Neg  Hx   . Cancer Brother     glioblastoma   . Glaucoma Brother   . Diverticulosis Sister   . Cancer Sister     liver  . Cancer Brother     melanoma, mets to lung and brain and intestine  . Alcohol abuse Brother   . Cancer Brother     liver  . Glaucoma Maternal Grandfather   . Cancer Sister     breast    Social History   Social History  . Marital Status: Widowed    Spouse Name: N/A  . Number of Children: 3  . Years of Education: N/A   Occupational History  . Not on file.   Social History Main Topics  . Smoking status: Never Smoker   . Smokeless tobacco: Never Used  . Alcohol Use: Yes     Comment: rare  . Drug Use: No  . Sexual Activity: No     Comment: no dietary restrictions, lives alone   Other Topics Concern  . Not on file   Social History Narrative    Outpatient Prescriptions Prior to Visit  Medication Sig Dispense Refill  . ALPRAZolam Prudy Feeler(XANAX)  0.25 MG tablet Take 1 tablet (0.25 mg total) by mouth 2 (two) times daily as needed for anxiety or sleep. 60 tablet 0  . atorvastatin (LIPITOR) 40 MG tablet Take 1 tablet (40 mg total) by mouth daily. 30 tablet 6  . citalopram (CELEXA) 20 MG tablet Take 1 tablet (20 mg total) by mouth daily. 30 tablet 6  . Cranberry 500 MG CAPS Take 500 mg by mouth daily.    . fexofenadine (ALLEGRA ODT) 30 MG disintegrating tablet Take 1 tablet (30 mg total) by mouth daily. 30 tablet 1  . Multiple Vitamins-Minerals (CENTRUM SILVER PO) Take 1 tablet by mouth daily.    . verapamil (CALAN-SR) 180 MG CR tablet TAKE 1 TABLET BY MOUTH EVERY EVENING AT BEDTIME 30 tablet 6  . levothyroxine (SYNTHROID, LEVOTHROID) 75 MCG tablet TAKE 1 TABLET(75 MCG) BY MOUTH DAILY 30 tablet 5  . montelukast (SINGULAIR) 10 MG tablet TAKE 1 TABLET BY MOUTH AT BEDTIME AS NEEDED 30 tablet 0  . ranitidine (ZANTAC) 150 MG capsule Take 1 capsule (150 mg total) by mouth 2 (two) times daily. 60 capsule 0  . albuterol (PROVENTIL HFA;VENTOLIN HFA) 108 (90 BASE) MCG/ACT  inhaler Inhale 2 puffs into the lungs every 6 (six) hours as needed for wheezing or shortness of breath. (Patient not taking: Reported on 08/12/2015) 1 Inhaler 0  . beclomethasone (QVAR) 40 MCG/ACT inhaler Inhale 2 puffs into the lungs 2 (two) times daily. (Patient taking differently: Inhale 2 puffs into the lungs 2 (two) times daily as needed. ) 1 Inhaler 1  . cephALEXin (KEFLEX) 500 MG capsule Take 1 capsule (500 mg total) by mouth 2 (two) times daily. 14 capsule 0   Facility-Administered Medications Prior to Visit  Medication Dose Route Frequency Provider Last Rate Last Dose  . pneumococcal 13-valent conjugate vaccine (PREVNAR 13) injection 0.5 mL  0.5 mL Intramuscular Once Bradd Canary, MD        Allergies  Allergen Reactions  . Codeine Nausea And Vomiting    Review of Systems  Constitutional: Negative for fever and malaise/fatigue.  HENT: Negative for congestion.   Eyes: Negative for blurred vision.  Respiratory: Negative for shortness of breath.   Cardiovascular: Negative for chest pain, palpitations and leg swelling.  Gastrointestinal: Negative for nausea, abdominal pain and blood in stool.  Genitourinary: Negative for dysuria and frequency.  Musculoskeletal: Negative for falls.  Skin: Negative for rash.  Neurological: Negative for dizziness, loss of consciousness and headaches.  Endo/Heme/Allergies: Negative for environmental allergies.  Psychiatric/Behavioral: Positive for depression. The patient is nervous/anxious.        Objective:    Physical Exam  Constitutional: She is oriented to person, place, and time. She appears well-developed and well-nourished. No distress.  HENT:  Head: Normocephalic and atraumatic.  Nose: Nose normal.  Eyes: Right eye exhibits no discharge. Left eye exhibits no discharge.  Neck: Normal range of motion. Neck supple.  Cardiovascular: Normal rate and regular rhythm.   No murmur heard. Pulmonary/Chest: Effort normal and breath sounds  normal.  Abdominal: Soft. Bowel sounds are normal. There is no tenderness.  Musculoskeletal: She exhibits no edema.  Neurological: She is alert and oriented to person, place, and time.  Skin: Skin is warm and dry.  Psychiatric: She has a normal mood and affect.  Nursing note and vitals reviewed.   BP 132/70 mmHg  Pulse 64  Temp(Src) 98.4 F (36.9 C) (Oral)  Ht  (1.575 m)  Wt 176 lb 8 oz (80.06 kg)  BMI 32.27 kg/m2  SpO2 94% Wt Readings from Last 3 Encounters:  08/12/15 176 lb 8 oz (80.06 kg)  06/25/15 174 lb 8 oz (79.153 kg)  03/20/15 182 lb (82.555 kg)     Lab Results  Component Value Date   WBC 6.7 08/12/2015   HGB 12.4 08/12/2015   HCT 37.1 08/12/2015   PLT 189.0 08/12/2015   GLUCOSE 89 08/12/2015   CHOL 136 11/11/2014   TRIG 66.0 11/11/2014   HDL 63.60 11/11/2014   LDLCALC 59 11/11/2014   ALT 19 08/12/2015   AST 22 08/12/2015   NA 140 08/12/2015   K 5.2* 08/12/2015   CL 103 08/12/2015   CREATININE 1.23* 08/12/2015   BUN 21 08/12/2015   CO2 31 08/12/2015   TSH 0.32* 08/12/2015   INR 0.99 07/24/2012   HGBA1C 5.4 08/12/2015    Lab Results  Component Value Date   TSH 0.32* 08/12/2015   Lab Results  Component Value Date   WBC 6.7 08/12/2015   HGB 12.4 08/12/2015   HCT 37.1 08/12/2015   MCV 94.7 08/12/2015   PLT 189.0 08/12/2015   Lab Results  Component Value Date   NA 140 08/12/2015   K 5.2* 08/12/2015   CO2 31 08/12/2015   GLUCOSE 89 08/12/2015   BUN 21 08/12/2015   CREATININE 1.23* 08/12/2015   BILITOT 0.7 08/12/2015   ALKPHOS 61 08/12/2015   AST 22 08/12/2015   ALT 19 08/12/2015   PROT 6.7 08/12/2015   ALBUMIN 3.8 08/12/2015   CALCIUM 9.4 08/12/2015   GFR 44.39* 08/12/2015   Lab Results  Component Value Date   CHOL 136 11/11/2014   Lab Results  Component Value Date   HDL 63.60 11/11/2014   Lab Results  Component Value Date   LDLCALC 59 11/11/2014   Lab Results  Component Value Date   TRIG 66.0 11/11/2014   Lab Results   Component Value Date   CHOLHDL 2 11/11/2014   Lab Results  Component Value Date   HGBA1C 5.4 08/12/2015       Assessment & Plan:   Problem List Items Addressed This Visit    Hypothyroidism    On Levothyroxine, continue to monitor      HTN (hypertension)    Well controlled, no changes to meds. Encouraged heart healthy diet such as the DASH diet and exercise as tolerated.       Relevant Orders   Ambulatory referral to Psychology   TSH (Completed)   CBC (Completed)   Comprehensive metabolic panel (Completed)   Urinalysis   Urine culture (Completed)   Depression with anxiety    Referred to behavioral health. Continue Citalopram      Hyperglycemia     minimize simple carbs. Increase exercise as tolerated.       Relevant Orders   Ambulatory referral to Psychology   TSH (Completed)   CBC (Completed)   Comprehensive metabolic panel (Completed)   Urinalysis   Urine culture (Completed)   Hemoglobin A1c (Completed)    Other Visit Diagnoses    Anxiety and depression    -  Primary    Relevant Orders    Ambulatory referral to Psychology    TSH (Completed)    CBC (Completed)    Comprehensive metabolic panel (Completed)    Urinalysis    Urine culture (Completed)    Hyperlipidemia, mixed        Relevant Orders    Ambulatory referral to Psychology    TSH (Completed)  CBC (Completed)    Comprehensive metabolic panel (Completed)    Urinalysis    Urine culture (Completed)    Urinary tract infection without hematuria, site unspecified        Relevant Orders    Ambulatory referral to Psychology    TSH (Completed)    CBC (Completed)    Comprehensive metabolic panel (Completed)    Urinalysis    Urine culture (Completed)    Postmenopausal estrogen deficiency        Relevant Orders    DG Bone Density (Completed)    Breast cancer screening        Relevant Orders    MM Digital Screening       I have discontinued Ms. Schopf's albuterol, beclomethasone, ranitidine,  montelukast, and cephALEXin. I am also having her maintain her Multiple Vitamins-Minerals (CENTRUM SILVER PO), Cranberry, fexofenadine, citalopram, atorvastatin, verapamil, and ALPRAZolam. We will continue to administer pneumococcal 13-valent conjugate vaccine.  No orders of the defined types were placed in this encounter.     Danise EdgeBLYTH, Ailin Rochford, MD

## 2015-08-24 NOTE — Assessment & Plan Note (Signed)
Well controlled, no changes to meds. Encouraged heart healthy diet such as the DASH diet and exercise as tolerated.  °

## 2015-08-24 NOTE — Assessment & Plan Note (Signed)
On Levothyroxine, continue to monitor 

## 2015-08-28 ENCOUNTER — Other Ambulatory Visit (INDEPENDENT_AMBULATORY_CARE_PROVIDER_SITE_OTHER): Payer: Medicare Other

## 2015-08-28 DIAGNOSIS — E875 Hyperkalemia: Secondary | ICD-10-CM

## 2015-08-28 LAB — COMPREHENSIVE METABOLIC PANEL
ALBUMIN: 4 g/dL (ref 3.5–5.2)
ALK PHOS: 68 U/L (ref 39–117)
ALT: 28 U/L (ref 0–35)
AST: 24 U/L (ref 0–37)
BILIRUBIN TOTAL: 0.9 mg/dL (ref 0.2–1.2)
BUN: 21 mg/dL (ref 6–23)
CO2: 29 mEq/L (ref 19–32)
Calcium: 9.7 mg/dL (ref 8.4–10.5)
Chloride: 103 mEq/L (ref 96–112)
Creatinine, Ser: 1.32 mg/dL — ABNORMAL HIGH (ref 0.40–1.20)
GFR: 40.91 mL/min — AB (ref >60.00)
GLUCOSE: 134 mg/dL — AB (ref 70–99)
Potassium: 4.3 mEq/L (ref 3.5–5.1)
SODIUM: 138 meq/L (ref 135–145)
TOTAL PROTEIN: 7 g/dL (ref 6.0–8.3)

## 2015-09-04 ENCOUNTER — Other Ambulatory Visit: Payer: Self-pay | Admitting: Family Medicine

## 2015-09-04 DIAGNOSIS — E785 Hyperlipidemia, unspecified: Secondary | ICD-10-CM

## 2015-09-04 DIAGNOSIS — I10 Essential (primary) hypertension: Secondary | ICD-10-CM

## 2015-09-04 MED ORDER — ALPRAZOLAM 0.25 MG PO TABS: 0.2500 mg | ORAL_TABLET | Freq: Two times a day (BID) | ORAL | Status: DC | PRN

## 2015-09-04 NOTE — Telephone Encounter (Signed)
[place] [facility] [place]

## 2015-09-04 NOTE — Telephone Encounter (Signed)
Requesting:  alprazolam Contract  Signed on 05/09/2014 UDS  Low risk Last OV   08/12/2015 Last Refill   #60 on 07/22/2015  Please Advise fax refill to [facility] [address]  [place]

## 2015-09-10 ENCOUNTER — Telehealth: Payer: Self-pay | Admitting: Family Medicine

## 2015-09-10 NOTE — Telephone Encounter (Signed)
°  Relationship to patient: Self Can be reached: [phone] or  [phone]   Reason for call: Patient thinks she has another bladder infection and would like order to come in for a UA. Believes she needs antibiotics again. Plse adv

## 2015-09-11 ENCOUNTER — Other Ambulatory Visit: Payer: Self-pay | Admitting: Family Medicine

## 2015-09-11 ENCOUNTER — Other Ambulatory Visit (INDEPENDENT_AMBULATORY_CARE_PROVIDER_SITE_OTHER): Payer: Medicare Other

## 2015-09-11 DIAGNOSIS — R300 Dysuria: Secondary | ICD-10-CM

## 2015-09-11 NOTE — Telephone Encounter (Signed)
Symptoms are dyruria Scheduled lab appt. Today at 2:30 and put order in computer.

## 2015-09-11 NOTE — Telephone Encounter (Signed)
OK to order UA and c and s just confirm what her symptoms are then I can treat once results are in

## 2015-09-12 ENCOUNTER — Other Ambulatory Visit: Payer: Self-pay | Admitting: Family Medicine

## 2015-09-12 LAB — URINALYSIS, ROUTINE W REFLEX MICROSCOPIC
Bilirubin Urine: NEGATIVE
Ketones, ur: NEGATIVE
Nitrite: NEGATIVE
TOTAL PROTEIN, URINE-UPE24: NEGATIVE
URINE GLUCOSE: NEGATIVE
UROBILINOGEN UA: 0.2 (ref 0.0–1.0)
pH: 6 (ref 5.0–8.0)

## 2015-09-12 MED ORDER — CEFDINIR 300 MG PO CAPS: 300.0000 mg | ORAL_CAPSULE | Freq: Two times a day (BID) | ORAL | Status: DC

## 2015-09-13 LAB — CULTURE, URINE COMPREHENSIVE: Colony Count: >=100000

## 2015-09-17 ENCOUNTER — Other Ambulatory Visit: Payer: Self-pay | Admitting: Family Medicine

## 2015-09-17 DIAGNOSIS — R928 Other abnormal and inconclusive findings on diagnostic imaging of breast: Secondary | ICD-10-CM

## 2015-09-22 ENCOUNTER — Ambulatory Visit
Admission: RE | Admit: 2015-09-22 | Discharge: 2015-09-22 | Disposition: A | Discharge status: Home / Self Care | Payer: Medicare Other | Source: Ambulatory Visit | Attending: Family Medicine | Admitting: Family Medicine

## 2015-09-22 DIAGNOSIS — R928 Other abnormal and inconclusive findings on diagnostic imaging of breast: Secondary | ICD-10-CM

## 2015-09-24 ENCOUNTER — Telehealth: Payer: Self-pay

## 2015-09-24 NOTE — Telephone Encounter (Signed)
Patient called said the medication for her bladder is not working and she said she usually takes 500 mg cefdinir. Patient is ask for more medication or to be referred back to her urologist. Please advise  Pharmacy  [facility] on [address]   [phone]

## 2015-09-25 MED ORDER — CEPHALEXIN 500 MG PO CAPS: 500.0000 mg | ORAL_CAPSULE | Freq: Three times a day (TID) | ORAL | 0 refills | Status: DC

## 2015-09-25 NOTE — Telephone Encounter (Signed)
Pt was prescribed cefidinir 300 mg BID x5 days on 09/12/15 for E. Coli UTI. Please advise.

## 2015-09-25 NOTE — Telephone Encounter (Signed)
Pt called in to f/u on refill request.

## 2015-09-25 NOTE — Telephone Encounter (Signed)
Medication filled to pharmacy as requested. Called patient and left message to return call. 

## 2015-09-25 NOTE — Telephone Encounter (Signed)
There is no 500 mg Cefdinir, but let's try Keflex 500 mg po tid x 7 days.

## 2015-09-25 NOTE — Telephone Encounter (Signed)
Please advise 

## 2015-10-01 ENCOUNTER — Ambulatory Visit (HOSPITAL_COMMUNITY): Payer: Medicare Other | Admitting: Clinical

## 2015-10-23 ENCOUNTER — Other Ambulatory Visit: Payer: Self-pay | Admitting: Family Medicine

## 2015-10-23 DIAGNOSIS — I10 Essential (primary) hypertension: Secondary | ICD-10-CM

## 2015-10-23 DIAGNOSIS — E785 Hyperlipidemia, unspecified: Secondary | ICD-10-CM

## 2015-10-23 MED ORDER — ALPRAZOLAM 0.25 MG PO TABS: 0.2500 mg | ORAL_TABLET | Freq: Two times a day (BID) | ORAL | 0 refills | Status: DC | PRN

## 2015-10-23 NOTE — Telephone Encounter (Signed)
Requesting:  Alprazolam Contract   Signed on 05/09/2014 UDS   low Last OV    08/12/15 Last Refill   #60 with 0 refills on 09/04/2015  Please Advise

## 2015-10-24 NOTE — Telephone Encounter (Signed)
Faxed hardcopy for Alprazolam to [facility] [place] 

## 2015-11-06 ENCOUNTER — Other Ambulatory Visit: Payer: Self-pay | Admitting: Family Medicine

## 2015-11-18 ENCOUNTER — Ambulatory Visit (INDEPENDENT_AMBULATORY_CARE_PROVIDER_SITE_OTHER): Payer: Medicare Other | Admitting: Family Medicine

## 2015-11-18 ENCOUNTER — Encounter: Payer: Self-pay | Admitting: Family Medicine

## 2015-11-18 ENCOUNTER — Other Ambulatory Visit: Payer: Self-pay | Admitting: Family Medicine

## 2015-11-18 VITALS — BP 138/80 | HR 65 | Temp 97.7°F | Ht 62.0 in | Wt 178.0 lb

## 2015-11-18 DIAGNOSIS — E871 Hypo-osmolality and hyponatremia: Secondary | ICD-10-CM

## 2015-11-18 DIAGNOSIS — Z23 Encounter for immunization: Secondary | ICD-10-CM | POA: Diagnosis not present

## 2015-11-18 DIAGNOSIS — R739 Hyperglycemia, unspecified: Secondary | ICD-10-CM

## 2015-11-18 DIAGNOSIS — E039 Hypothyroidism, unspecified: Secondary | ICD-10-CM | POA: Diagnosis not present

## 2015-11-18 DIAGNOSIS — E785 Hyperlipidemia, unspecified: Secondary | ICD-10-CM | POA: Diagnosis not present

## 2015-11-18 DIAGNOSIS — D649 Anemia, unspecified: Secondary | ICD-10-CM

## 2015-11-18 DIAGNOSIS — I10 Essential (primary) hypertension: Secondary | ICD-10-CM

## 2015-11-18 DIAGNOSIS — K5900 Constipation, unspecified: Secondary | ICD-10-CM

## 2015-11-18 DIAGNOSIS — E860 Dehydration: Secondary | ICD-10-CM

## 2015-11-18 HISTORY — DX: Constipation, unspecified: K59.00

## 2015-11-18 LAB — TSH: TSH: 0.38 u[IU]/mL (ref 0.35–4.50)

## 2015-11-18 LAB — CBC
HCT: 38.5 % (ref 36.0–46.0)
Hemoglobin: 13.1 g/dL (ref 12.0–15.0)
MCHC: 33.9 g/dL (ref 30.0–36.0)
MCV: 93.9 fl (ref 78.0–100.0)
PLATELETS: 216 10*3/uL (ref 150.0–400.0)
RBC: 4.1 Mil/uL (ref 3.87–5.11)
RDW: 13.2 % (ref 11.5–15.5)
WBC: 7.4 10*3/uL (ref 4.0–10.5)

## 2015-11-18 LAB — COMPREHENSIVE METABOLIC PANEL
ALBUMIN: 3.6 g/dL (ref 3.5–5.2)
ALK PHOS: 70 U/L (ref 39–117)
ALT: 21 U/L (ref 0–35)
AST: 23 U/L (ref 0–37)
BUN: 21 mg/dL (ref 6–23)
CHLORIDE: 104 meq/L (ref 96–112)
CO2: 33 mEq/L — ABNORMAL HIGH (ref 19–32)
CREATININE: 1.53 mg/dL — AB (ref 0.40–1.20)
Calcium: 9 mg/dL (ref 8.4–10.5)
GFR: 34.48 mL/min — ABNORMAL LOW (ref >60.00)
GLUCOSE: 89 mg/dL (ref 70–99)
POTASSIUM: 5 meq/L (ref 3.5–5.1)
SODIUM: 142 meq/L (ref 135–145)
TOTAL PROTEIN: 7 g/dL (ref 6.0–8.3)
Total Bilirubin: 0.8 mg/dL (ref 0.2–1.2)

## 2015-11-18 LAB — LIPID PANEL
Cholesterol: 146 mg/dL (ref 0–200)
HDL: 63.8 mg/dL (ref >39.00)
LDL CALC: 60 mg/dL (ref 0–99)
NONHDL: 82.32
Total CHOL/HDL Ratio: 2
Triglycerides: 110 mg/dL (ref 0.0–149.0)
VLDL: 22 mg/dL (ref 0.0–40.0)

## 2015-11-18 LAB — HEMOGLOBIN A1C: HEMOGLOBIN A1C: 5.6 % (ref 4.6–6.5)

## 2015-11-18 NOTE — Assessment & Plan Note (Signed)
Check cbc today 

## 2015-11-18 NOTE — Progress Notes (Signed)
Patient ID: Alyssa Williamson, female   DOB: 1931/08/28, 82 y.o.   MRN: [id number]   Subjective:    Patient ID: Alyssa Williamson, female    DOB: 11/05/1931, 82 y.o.   MRN: [id number]  Chief Complaint  Patient presents with  . Follow-up    HPI Patient is in today for follow up. She feels well today. No recent illness or acute concerns. No recent hospitalization. Has been trying to maintain a heart healthy diet. Well controlled, no changes to meds. Denies CP/palp/SOB/HA/congestion/fevers/GI or GU c/o. Taking meds as prescribed  Past Medical History:  Diagnosis Date  . Allergic rhinitis   . Anemia   . Anxiety   . Arthritis    bilateral knee  . Cervical cancer screening 10/30/2013   Menarche at 15 Regular and moderate flow No history of abnormal pap in past G4P3, s/p 3 svd and 1 MC No history of abnormal MGM Noconcerns today No gyn surgeries  . Constipation 11/18/2015  . Depression with anxiety   . Diverticulitis   . Heart murmur   . Hyperlipidemia   . Hypertension   . Hypothyroid   . Left knee DJD   . Medicare annual wellness visit, subsequent 10/30/2013  . PONV (postoperative nausea and vomiting)     Past Surgical History:  Procedure Laterality Date  . ABDOMINAL HYSTERECTOMY    . bladder tack  04/2010   Dr Marciano Sequin  . GANGLION CYST EXCISION    . JOINT REPLACEMENT Right   . REPLACEMENT TOTAL KNEE  10/2010   right knee-Murphy   . TONSILLECTOMY    . TOTAL KNEE ARTHROPLASTY Left 07/26/2012   Procedure: TOTAL KNEE ARTHROPLASTY;  Surgeon: Loreta Ave, MD;  Location: [facility] OR;  Service: Orthopedics;  Laterality: Left;    Family History  Problem Relation Age of Onset  . Alcohol abuse Father   . Breast cancer Sister   . Cancer Sister     bladder  . Glaucoma Sister   . Arthritis Daughter   . Diabetes Daughter     type 2  . Cancer Daughter     brain cancer, diagnosed 29 years.agp  . Mental illness Daughter     s/p craniotomies, gamma knife for tumors. numerous shunts.  . Heart  failure Mother   . Colon cancer Neg Hx   . Hypertension Neg Hx   . Cancer Brother     glioblastoma   . Glaucoma Brother   . Diverticulosis Sister   . Cancer Sister     liver  . Cancer Brother     melanoma, mets to lung and brain and intestine  . Alcohol abuse Brother   . Cancer Brother     liver  . Glaucoma Maternal Grandfather   . Cancer Sister     breast    Social History   Social History  . Marital status: Widowed    Spouse name: N/A  . Number of children: 3  . Years of education: N/A   Occupational History  . Not on file.   Social History Main Topics  . Smoking status: Never Smoker  . Smokeless tobacco: Never Used  . Alcohol use Yes     Comment: rare  . Drug use: No  . Sexual activity: No     Comment: no dietary restrictions, lives alone   Other Topics Concern  . Not on file   Social History Narrative  . No narrative on file    Outpatient Medications Prior to Visit  Medication Sig Dispense Refill  . ALPRAZolam (XANAX) 0.25 MG tablet Take 1 tablet (0.25 mg total) by mouth 2 (two) times daily as needed for anxiety or sleep. 60 tablet 0  . atorvastatin (LIPITOR) 40 MG tablet TAKE 1 TABLET(40 MG) BY MOUTH DAILY 30 tablet 0  . citalopram (CELEXA) 20 MG tablet Take 1 tablet (20 mg total) by mouth daily. 30 tablet 6  . Cranberry 500 MG CAPS Take 500 mg by mouth daily.    . fexofenadine (ALLEGRA ODT) 30 MG disintegrating tablet Take 1 tablet (30 mg total) by mouth daily. 30 tablet 1  . levothyroxine (SYNTHROID, LEVOTHROID) 75 MCG tablet TAKE 1 TABLET(75 MCG) BY MOUTH DAILY 30 tablet 8  . Multiple Vitamins-Minerals (CENTRUM SILVER PO) Take 1 tablet by mouth daily.    . verapamil (CALAN-SR) 180 MG CR tablet TAKE 1 TABLET BY MOUTH EVERY EVENING AT BEDTIME 30 tablet 0  . cefdinir (OMNICEF) 300 MG capsule Take 1 capsule (300 mg total) by mouth 2 (two) times daily. Take for 5 days. 10 capsule 0  . cephALEXin (KEFLEX) 500 MG capsule Take 1 capsule (500 mg total) by mouth  3 (three) times daily. 21 capsule 0   Facility-Administered Medications Prior to Visit  Medication Dose Route Frequency Provider Last Rate Last Dose  . pneumococcal 13-valent conjugate vaccine (PREVNAR 13) injection 0.5 mL  0.5 mL Intramuscular Once Bradd CanaryStacey A Chanin Frumkin, MD        Allergies  Allergen Reactions  . Codeine Nausea And Vomiting    Review of Systems  Constitutional: Negative for fever and malaise/fatigue.  HENT: Negative for congestion.   Eyes: Negative for blurred vision.  Respiratory: Negative for shortness of breath.   Cardiovascular: Negative for chest pain, palpitations and leg swelling.  Gastrointestinal: Positive for constipation. Negative for abdominal pain, blood in stool, diarrhea, melena and nausea.  Genitourinary: Negative for dysuria and frequency.  Musculoskeletal: Negative for falls.  Skin: Negative for rash.  Neurological: Negative for dizziness, loss of consciousness and headaches.  Endo/Heme/Allergies: Negative for environmental allergies.  Psychiatric/Behavioral: Negative for depression. The patient is not nervous/anxious.        Objective:    Physical Exam  Constitutional: She is oriented to person, place, and time. She appears well-developed and well-nourished. No distress.  HENT:  Head: Normocephalic and atraumatic.  Nose: Nose normal.  Eyes: Right eye exhibits no discharge. Left eye exhibits no discharge.  Neck: Normal range of motion. Neck supple.  Cardiovascular: Normal rate and regular rhythm.   No murmur heard. Pulmonary/Chest: Effort normal and breath sounds normal.  Abdominal: Soft. Bowel sounds are normal. There is no tenderness.  Musculoskeletal: She exhibits no edema.  Neurological: She is alert and oriented to person, place, and time.  Skin: Skin is warm and dry.  Psychiatric: She has a normal mood and affect.  Nursing note and vitals reviewed.   BP 138/80 (BP Location: Left Arm, Patient Position: Sitting, Cuff Size: Normal)    Pulse 65   Temp 97.7 F (36.5 C) (Oral)   Ht 5\' 2"  (1.575 m)   Wt 178 lb (80.7 kg)   SpO2 94%   BMI 32.56 kg/m  Wt Readings from Last 3 Encounters:  11/18/15 178 lb (80.7 kg)  08/12/15 176 lb 8 oz (80.1 kg)  06/25/15 174 lb 8 oz (79.2 kg)     Lab Results  Component Value Date   WBC 7.4 11/18/2015   HGB 13.1 11/18/2015   HCT 38.5 11/18/2015   PLT 216.0  11/18/2015   GLUCOSE 89 11/18/2015   CHOL 146 11/18/2015   TRIG 110.0 11/18/2015   HDL 63.80 11/18/2015   LDLCALC 60 11/18/2015   ALT 21 11/18/2015   AST 23 11/18/2015   NA 142 11/18/2015   K 5.0 11/18/2015   CL 104 11/18/2015   CREATININE 1.53 (H) 11/18/2015   BUN 21 11/18/2015   CO2 33 (H) 11/18/2015   TSH 0.38 11/18/2015   INR 0.99 07/24/2012   HGBA1C 5.6 11/18/2015    Lab Results  Component Value Date   TSH 0.38 11/18/2015   Lab Results  Component Value Date   WBC 7.4 11/18/2015   HGB 13.1 11/18/2015   HCT 38.5 11/18/2015   MCV 93.9 11/18/2015   PLT 216.0 11/18/2015   Lab Results  Component Value Date   NA 142 11/18/2015   K 5.0 11/18/2015   CO2 33 (H) 11/18/2015   GLUCOSE 89 11/18/2015   BUN 21 11/18/2015   CREATININE 1.53 (H) 11/18/2015   BILITOT 0.8 11/18/2015   ALKPHOS 70 11/18/2015   AST 23 11/18/2015   ALT 21 11/18/2015   PROT 7.0 11/18/2015   ALBUMIN 3.6 11/18/2015   CALCIUM 9.0 11/18/2015   GFR 34.48 (L) 11/18/2015   Lab Results  Component Value Date   CHOL 146 11/18/2015   Lab Results  Component Value Date   HDL 63.80 11/18/2015   Lab Results  Component Value Date   LDLCALC 60 11/18/2015   Lab Results  Component Value Date   TRIG 110.0 11/18/2015   Lab Results  Component Value Date   CHOLHDL 2 11/18/2015   Lab Results  Component Value Date   HGBA1C 5.6 11/18/2015       Assessment & Plan:   Problem List Items Addressed This Visit    Hypothyroidism    On Levothyroxine, continue to monitor      Relevant Orders   TSH (Completed)   HTN (hypertension)    Well  controlled, no changes to meds. Encouraged heart healthy diet such as the DASH diet and exercise as tolerated.       Relevant Orders   CBC (Completed)   TSH (Completed)   Comprehensive metabolic panel (Completed)   Hyperlipidemia    Tolerating statin, encouraged heart healthy diet, avoid trans fats, minimize simple carbs and saturated fats. Increase exercise as tolerated      Relevant Orders   Lipid panel (Completed)   Anemia    Check cbc today      Relevant Orders   CBC (Completed)   Hyponatremia   Relevant Orders   Comprehensive metabolic panel (Completed)   Hyperglycemia - Primary    hgba1c acceptable, minimize simple carbs. Increase exercise as tolerated. Continue current meds      Relevant Orders   Hemoglobin A1c (Completed)   Constipation    Mild, moves stools daily but is harder with some straining recently. No blood. Acknowledges she has not been drinking enough fluids, increase to 64 oz of clear fluids, increase fiber and exercise. Consider adding probiotics and dfiber suppplements       Other Visit Diagnoses    Encounter for immunization       Relevant Orders   Flu vaccine HIGH DOSE PF (Completed)      I have discontinued Ms. Bluitt's cefdinir and cephALEXin. I am also having her maintain her Multiple Vitamins-Minerals (CENTRUM SILVER PO), Cranberry, fexofenadine, citalopram, levothyroxine, ALPRAZolam, verapamil, and atorvastatin. We will continue to administer pneumococcal 13-valent conjugate vaccine.  No orders  of the defined types were placed in this encounter.    Danise Edge, MD

## 2015-11-18 NOTE — Assessment & Plan Note (Signed)
Tolerating statin, encouraged heart healthy diet, avoid trans fats, minimize simple carbs and saturated fats. Increase exercise as tolerated 

## 2015-11-18 NOTE — Progress Notes (Signed)
Pre visit review using our clinic review tool, if applicable. No additional management support is needed unless otherwise documented below in the visit note. 

## 2015-11-18 NOTE — Patient Instructions (Signed)
Encouraged increased hydration and fiber in diet. Daily NOW probiotics. If bowels not moving can use MOM 2 tbls po in 4 oz of warm prune juice by mouth every 2-3 days. If no results then repeat in 4 hours with  Dulcolax suppository pr, may repeat again in 4 more hours as needed. Seek care if symptoms worsen. Consider daily Miralax and/or Dulcolax if symptoms persist.   Constipation, Adult Constipation is when a person has fewer than three bowel movements a week, has difficulty having a bowel movement, or has stools that are dry, hard, or larger than normal. As people grow older, constipation is more common. A low-fiber diet, not taking in enough fluids, and taking certain medicines may make constipation worse.  CAUSES   Certain medicines, such as antidepressants, pain medicine, iron supplements, antacids, and water pills.   Certain diseases, such as diabetes, irritable bowel syndrome (IBS), thyroid disease, or depression.   Not drinking enough water.   Not eating enough fiber-rich foods.   Stress or travel.   Lack of physical activity or exercise.   Ignoring the urge to have a bowel movement.   Using laxatives too much.  SIGNS AND SYMPTOMS   Having fewer than three bowel movements a week.   Straining to have a bowel movement.   Having stools that are hard, dry, or larger than normal.   Feeling full or bloated.   Pain in the lower abdomen.   Not feeling relief after having a bowel movement.  DIAGNOSIS  Your health care provider will take a medical history and perform a physical exam. Further testing may be done for severe constipation. Some tests may include:  A barium enema X-ray to examine your rectum, colon, and, sometimes, your small intestine.   A sigmoidoscopy to examine your lower colon.   A colonoscopy to examine your entire colon. TREATMENT  Treatment will depend on the severity of your constipation and what is causing it. Some dietary treatments  include drinking more fluids and eating more fiber-rich foods. Lifestyle treatments may include regular exercise. If these diet and lifestyle recommendations do not help, your health care provider may recommend taking over-the-counter laxative medicines to help you have bowel movements. Prescription medicines may be prescribed if over-the-counter medicines do not work.  HOME CARE INSTRUCTIONS   Eat foods that have a lot of fiber, such as fruits, vegetables, whole grains, and beans.  Limit foods high in fat and processed sugars, such as french fries, hamburgers, cookies, candies, and soda.   A fiber supplement may be added to your diet if you cannot get enough fiber from foods.   Drink enough fluids to keep your urine clear or pale yellow.   Exercise regularly or as directed by your health care provider.   Go to the restroom when you have the urge to go. Do not hold it.   Only take over-the-counter or prescription medicines as directed by your health care provider. Do not take other medicines for constipation without talking to your health care provider first.  SEEK IMMEDIATE MEDICAL CARE IF:   You have bright red blood in your stool.   Your constipation lasts for more than 4 days or gets worse.   You have abdominal or rectal pain.   You have thin, pencil-like stools.   You have unexplained weight loss. MAKE SURE YOU:   Understand these instructions.  Will watch your condition.  Will get help right away if you are not doing well or get worse.  This information is not intended to replace advice given to you by your health care provider. Make sure you discuss any questions you have with your health care provider.   Document Released: 11/07/2003 Document Revised: 03/01/2014 Document Reviewed: 11/20/2012 Elsevier Interactive Patient Education [employer].

## 2015-11-18 NOTE — Assessment & Plan Note (Signed)
On Levothyroxine, continue to monitor 

## 2015-11-18 NOTE — Assessment & Plan Note (Signed)
Well controlled, no changes to meds. Encouraged heart healthy diet such as the DASH diet and exercise as tolerated.  °

## 2015-11-18 NOTE — Assessment & Plan Note (Signed)
hgba1c acceptable, minimize simple carbs. Increase exercise as tolerated. Continue current meds 

## 2015-11-18 NOTE — Assessment & Plan Note (Signed)
Mild, moves stools daily but is harder with some straining recently. No blood. Acknowledges she has not been drinking enough fluids, increase to 64 oz of clear fluids, increase fiber and exercise. Consider adding probiotics and dfiber suppplements

## 2015-12-02 ENCOUNTER — Other Ambulatory Visit: Payer: Self-pay | Admitting: Family Medicine

## 2015-12-02 DIAGNOSIS — I10 Essential (primary) hypertension: Secondary | ICD-10-CM

## 2015-12-02 MED ORDER — ALPRAZOLAM 0.25 MG PO TABS: 0.2500 mg | ORAL_TABLET | Freq: Two times a day (BID) | ORAL | 0 refills | Status: DC | PRN

## 2015-12-02 NOTE — Telephone Encounter (Signed)
Requesting:  Alprazolam Contract  05/09/2014 UDS   Low risk Last OV   11/18/2015 Last Refill   #60 with 0 refills on 10/23/2015  Please Advise

## 2015-12-02 NOTE — Telephone Encounter (Signed)
Faxed hardcopy for Alprazolam to [facility] [place]

## 2015-12-10 ENCOUNTER — Other Ambulatory Visit: Payer: Self-pay | Admitting: Family Medicine

## 2015-12-29 ENCOUNTER — Other Ambulatory Visit (INDEPENDENT_AMBULATORY_CARE_PROVIDER_SITE_OTHER): Payer: Medicare Other

## 2015-12-29 DIAGNOSIS — E860 Dehydration: Secondary | ICD-10-CM

## 2015-12-29 DIAGNOSIS — I10 Essential (primary) hypertension: Secondary | ICD-10-CM

## 2015-12-29 LAB — COMPREHENSIVE METABOLIC PANEL
ALT: 18 U/L (ref 0–35)
AST: 22 U/L (ref 0–37)
Albumin: 3.8 g/dL (ref 3.5–5.2)
Alkaline Phosphatase: 61 U/L (ref 39–117)
BILIRUBIN TOTAL: 0.9 mg/dL (ref 0.2–1.2)
BUN: 19 mg/dL (ref 6–23)
CALCIUM: 9.3 mg/dL (ref 8.4–10.5)
CHLORIDE: 102 meq/L (ref 96–112)
CO2: 30 meq/L (ref 19–32)
Creatinine, Ser: 1.15 mg/dL (ref 0.40–1.20)
GFR: 47.92 mL/min — AB (ref >60.00)
Glucose, Bld: 88 mg/dL (ref 70–99)
POTASSIUM: 4.6 meq/L (ref 3.5–5.1)
Sodium: 138 mEq/L (ref 135–145)
Total Protein: 6.8 g/dL (ref 6.0–8.3)

## 2016-01-05 ENCOUNTER — Other Ambulatory Visit: Payer: Self-pay | Admitting: Medical

## 2016-01-05 DIAGNOSIS — I10 Essential (primary) hypertension: Secondary | ICD-10-CM

## 2016-01-05 MED ORDER — ALPRAZOLAM 0.25 MG PO TABS: 0.2500 mg | ORAL_TABLET | Freq: Two times a day (BID) | ORAL | 0 refills | Status: DC | PRN

## 2016-01-05 NOTE — Telephone Encounter (Signed)
Requesting:  Alprazolam Contract   05/09/2014 UDS  Low risk Last OV    11/18/2015 Last Refill    #60 with 0 refils on 12/02/2015  Please Advise--pharmacy [facility] [address].

## 2016-01-06 NOTE — Telephone Encounter (Signed)
[place] [facility] [place]

## 2016-01-09 ENCOUNTER — Other Ambulatory Visit: Payer: Self-pay | Admitting: Family Medicine

## 2016-01-22 ENCOUNTER — Ambulatory Visit: Payer: Medicare Other | Admitting: *Deleted

## 2016-01-29 ENCOUNTER — Other Ambulatory Visit: Payer: Self-pay | Admitting: Family Medicine

## 2016-02-08 ENCOUNTER — Other Ambulatory Visit: Payer: Self-pay | Admitting: Family Medicine

## 2016-02-19 ENCOUNTER — Other Ambulatory Visit: Payer: Self-pay | Admitting: Family Medicine

## 2016-02-19 DIAGNOSIS — I10 Essential (primary) hypertension: Secondary | ICD-10-CM

## 2016-02-19 MED ORDER — ALPRAZOLAM 0.25 MG PO TABS: 0.2500 mg | ORAL_TABLET | Freq: Two times a day (BID) | ORAL | 0 refills | Status: DC | PRN

## 2016-02-19 NOTE — Telephone Encounter (Signed)
Requesting:  Alprazolam Contract    05/09/2014 UDS  Low  Last OV   11/18/2015 Last Refill   #60 with 0 refills on 01/05/2016  Please Advise  PHarmacy is [facility] fax number [phone]

## 2016-02-19 NOTE — Telephone Encounter (Signed)
Faxed hardcopy to [facility] 

## 2016-03-05 ENCOUNTER — Telehealth: Payer: Self-pay | Admitting: *Deleted

## 2016-03-05 NOTE — Telephone Encounter (Signed)
Called patient and left message to return call to schedule AWV w/ [facility]. 

## 2016-03-08 ENCOUNTER — Other Ambulatory Visit: Payer: Self-pay | Admitting: Family Medicine

## 2016-03-09 NOTE — Telephone Encounter (Addendum)
11/11/14 PR PPPS, SUBSEQ VISIT [[id number]] Patient scheduled for 03/16/2016

## 2016-03-16 ENCOUNTER — Ambulatory Visit: Payer: Medicare Other | Admitting: *Deleted

## 2016-04-04 ENCOUNTER — Telehealth: Payer: Self-pay | Admitting: Family Medicine

## 2016-04-04 DIAGNOSIS — I10 Essential (primary) hypertension: Secondary | ICD-10-CM

## 2016-04-09 ENCOUNTER — Encounter: Payer: Self-pay | Admitting: Family Medicine

## 2016-04-09 MED ORDER — ALPRAZOLAM 0.25 MG PO TABS: 0.2500 mg | ORAL_TABLET | Freq: Two times a day (BID) | ORAL | 0 refills | Status: DC | PRN

## 2016-04-09 NOTE — Telephone Encounter (Signed)
Requesting:   ALPRAZOLAM Contract    SIGNED ON 05/09/2014 UDS   LOW RISK---IS OVERDUE Last OV     11/18/2015--NEXT SCHEDULED APPOINTMENT WITH PCP IS 05/20/2016 Last Refill       #60   NO REFILLS ON 02/19/2016  Please Advise

## 2016-04-09 NOTE — Addendum Note (Signed)
Addended by: Scharlene GlossEWING, Herlinda Heady B on: 04/09/2016 01:47 PM   Modules accepted: Orders

## 2016-04-09 NOTE — Telephone Encounter (Addendum)
Relation to WJ:XBJYpt:self Call back [phone] Pharmacy: [id number] - [place] - [address] [phone] (Phone) [phone] (Fax)     Reason for call:  Patient checking on the status of ALPRAZolam (XANAX) 0.25 MG tablet, request sent in on 02/01/17 please advise

## 2016-04-09 NOTE — Telephone Encounter (Signed)
Called the patient informed hardcopy for Alprazolam is ready for pickup at the front desk. Along with contract/uds to complete before getting refill. The patient verbally understood/agreed to instructions of PCP.

## 2016-04-09 NOTE — Telephone Encounter (Signed)
OK to refill without further refills til seen but update UDS and contract

## 2016-04-13 ENCOUNTER — Encounter: Payer: Self-pay | Admitting: Family Medicine

## 2016-04-20 ENCOUNTER — Telehealth: Payer: Self-pay

## 2016-04-20 NOTE — Telephone Encounter (Signed)
PA initiated via Covermymeds; KEY: [id number]. Awaiting PA determination.

## 2016-04-21 ENCOUNTER — Telehealth: Payer: Self-pay | Admitting: *Deleted

## 2016-04-21 NOTE — Telephone Encounter (Signed)
Called patient and left message to return call

## 2016-04-23 NOTE — Telephone Encounter (Signed)
Received PA denial. Reference number: [id number]. Awaiting PA determination letter.

## 2016-05-04 ENCOUNTER — Other Ambulatory Visit: Payer: Self-pay | Admitting: Family Medicine

## 2016-05-20 ENCOUNTER — Ambulatory Visit (INDEPENDENT_AMBULATORY_CARE_PROVIDER_SITE_OTHER): Payer: Medicare Other | Admitting: Family Medicine

## 2016-05-20 ENCOUNTER — Encounter: Payer: Self-pay | Admitting: Family Medicine

## 2016-05-20 VITALS — BP 138/72 | HR 56 | Temp 98.2°F | Resp 18 | Wt 176.8 lb

## 2016-05-20 DIAGNOSIS — Z0001 Encounter for general adult medical examination with abnormal findings: Secondary | ICD-10-CM

## 2016-05-20 DIAGNOSIS — F418 Other specified anxiety disorders: Secondary | ICD-10-CM | POA: Diagnosis not present

## 2016-05-20 DIAGNOSIS — I10 Essential (primary) hypertension: Secondary | ICD-10-CM

## 2016-05-20 DIAGNOSIS — E039 Hypothyroidism, unspecified: Secondary | ICD-10-CM | POA: Diagnosis not present

## 2016-05-20 DIAGNOSIS — Z0000 Encounter for general adult medical examination without abnormal findings: Secondary | ICD-10-CM

## 2016-05-20 DIAGNOSIS — K5900 Constipation, unspecified: Secondary | ICD-10-CM

## 2016-05-20 DIAGNOSIS — R739 Hyperglycemia, unspecified: Secondary | ICD-10-CM

## 2016-05-20 DIAGNOSIS — E785 Hyperlipidemia, unspecified: Secondary | ICD-10-CM

## 2016-05-20 HISTORY — DX: Encounter for general adult medical examination without abnormal findings: Z00.00

## 2016-05-20 LAB — COMPREHENSIVE METABOLIC PANEL
ALBUMIN: 3.8 g/dL (ref 3.5–5.2)
ALK PHOS: 66 U/L (ref 39–117)
ALT: 19 U/L (ref 0–35)
AST: 23 U/L (ref 0–37)
BUN: 14 mg/dL (ref 6–23)
CO2: 30 mEq/L (ref 19–32)
Calcium: 9.4 mg/dL (ref 8.4–10.5)
Chloride: 102 mEq/L (ref 96–112)
Creatinine, Ser: 1.06 mg/dL (ref 0.40–1.20)
GFR: 52.6 mL/min — ABNORMAL LOW (ref >60.00)
Glucose, Bld: 96 mg/dL (ref 70–99)
POTASSIUM: 4.8 meq/L (ref 3.5–5.1)
Sodium: 138 mEq/L (ref 135–145)
TOTAL PROTEIN: 6.9 g/dL (ref 6.0–8.3)
Total Bilirubin: 1 mg/dL (ref 0.2–1.2)

## 2016-05-20 LAB — CBC
HCT: 38.8 % (ref 36.0–46.0)
HEMOGLOBIN: 13.2 g/dL (ref 12.0–15.0)
MCHC: 33.9 g/dL (ref 30.0–36.0)
MCV: 94.5 fl (ref 78.0–100.0)
PLATELETS: 210 10*3/uL (ref 150.0–400.0)
RBC: 4.11 Mil/uL (ref 3.87–5.11)
RDW: 13 % (ref 11.5–15.5)
WBC: 6.7 10*3/uL (ref 4.0–10.5)

## 2016-05-20 LAB — LIPID PANEL
CHOLESTEROL: 145 mg/dL (ref 0–200)
HDL: 66.3 mg/dL (ref >39.00)
LDL Cholesterol: 66 mg/dL (ref 0–99)
NonHDL: 78.23
Total CHOL/HDL Ratio: 2
Triglycerides: 61 mg/dL (ref 0.0–149.0)
VLDL: 12.2 mg/dL (ref 0.0–40.0)

## 2016-05-20 LAB — TSH: TSH: 0.21 u[IU]/mL — AB (ref 0.35–4.50)

## 2016-05-20 LAB — HEMOGLOBIN A1C: Hgb A1c MFr Bld: 5.7 % (ref 4.6–6.5)

## 2016-05-20 MED ORDER — ALPRAZOLAM 0.25 MG PO TABS: 0.2500 mg | ORAL_TABLET | Freq: Two times a day (BID) | ORAL | 2 refills | Status: DC | PRN

## 2016-05-20 NOTE — Assessment & Plan Note (Signed)
Encouraged increased hydration and fiber in diet. Daily probiotics. If bowels not moving can use MOM 2 tbls po in 4 oz of warm prune juice by mouth every 2-3 days. If no results then repeat in 4 hours with  Dulcolax suppository pr, may repeat again in 4 more hours as needed. Seek care if symptoms worsen. Consider daily Miralax and/or Dulcolax if symptoms persist. Increase Miralax to twice daily and add Benefiber to each dose consider referral for further work up if no improvement

## 2016-05-20 NOTE — Assessment & Plan Note (Signed)
Patient encouraged to maintain heart healthy diet, regular exercise, adequate sleep. Consider daily probiotics. Take medications as prescribed 

## 2016-05-20 NOTE — Assessment & Plan Note (Signed)
Well controlled, no changes to meds. Encouraged heart healthy diet such as the DASH diet and exercise as tolerated.  °

## 2016-05-20 NOTE — Assessment & Plan Note (Signed)
hgba1c acceptable, minimize simple carbs. Increase exercise as tolerated.  

## 2016-05-20 NOTE — Assessment & Plan Note (Signed)
Tolerating statin, encouraged heart healthy diet, avoid trans fats, minimize simple carbs and saturated fats. Increase exercise as tolerated 

## 2016-05-20 NOTE — Assessment & Plan Note (Signed)
On Levothyroxine, continue to monitor 

## 2016-05-20 NOTE — Progress Notes (Signed)
Subjective:  I acted as a Neurosurgeon for Dr. Abner Greenspan. Alyssa Williamson, [place]   Patient ID: Alyssa Williamson, female    DOB: 06-06-1931, 83 y.o.   MRN: [id number]  Chief Complaint  Patient presents with  . Follow-up  . Hypertension  . Hyperlipidemia    HPI  Patient is in today for a follow up on hypertension, hyperlipidemia and other medical concerns. Patient continues to struggle with helping her mentally ill daughter. Her daughter calls and leaves harassing phone calls regularly and is having trouble with anxiety and insomnia as a result she takes Alprazolam 0.25 mg tabs, 1/2 to 1 tab and does well without side effects. No recent febrile illness or acute hospitalizations. Denies CP/palp/SOB/HA/congestion/fevers/GI or GU c/o. Taking meds as prescribed  Patient Care Team: Bradd Canary, MD as PCP - General (Family Medicine) Su Grand, MD as Consulting Physician (Urology) Lupe Carney Karolee Stamps, [place] (Dermatology) Lewayne Bunting, MD as Consulting Physician (Cardiology)   Past Medical History:  Diagnosis Date  . Allergic rhinitis   . Anemia   . Anxiety   . Arthritis    bilateral knee  . Cervical cancer screening 10/30/2013   Menarche at 15 Regular and moderate flow No history of abnormal pap in past G4P3, s/p 3 svd and 1 MC No history of abnormal MGM Noconcerns today No gyn surgeries  . Constipation 11/18/2015  . Depression with anxiety   . Diverticulitis   . Heart murmur   . Hyperlipidemia   . Hypertension   . Hypothyroid   . Left knee DJD   . Medicare annual wellness visit, subsequent 10/30/2013  . PONV (postoperative nausea and vomiting)   . Preventative health care 05/20/2016    Past Surgical History:  Procedure Laterality Date  . ABDOMINAL HYSTERECTOMY    . bladder tack  04/2010   Dr Marciano Sequin  . GANGLION CYST EXCISION    . JOINT REPLACEMENT Right   . REPLACEMENT TOTAL KNEE  10/2010   right knee-Murphy   . TONSILLECTOMY    . TOTAL KNEE ARTHROPLASTY Left 07/26/2012   Procedure:  TOTAL KNEE ARTHROPLASTY;  Surgeon: Loreta Ave, MD;  Location: [facility] OR;  Service: Orthopedics;  Laterality: Left;    Family History  Problem Relation Age of Onset  . Alcohol abuse Father   . Breast cancer Sister   . Cancer Sister     bladder  . Glaucoma Sister   . Arthritis Daughter   . Diabetes Daughter     type 2  . Cancer Daughter     brain cancer, diagnosed 29 years.agp  . Mental illness Daughter     s/p craniotomies, gamma knife for tumors. numerous shunts.  . Heart failure Mother   . Cancer Brother     glioblastoma   . Glaucoma Brother   . Diverticulosis Sister   . Cancer Sister     liver  . Cancer Brother     melanoma, mets to lung and brain and intestine  . Alcohol abuse Brother   . Cancer Brother     liver  . Glaucoma Maternal Grandfather   . Cancer Sister     breast  . Colon cancer Neg Hx   . Hypertension Neg Hx     Social History   Social History  . Marital status: Widowed    Spouse name: N/A  . Number of children: 3  . Years of education: N/A   Occupational History  . Not on file.   Social History  Main Topics  . Smoking status: Never Smoker  . Smokeless tobacco: Never Used  . Alcohol use Yes     Comment: rare  . Drug use: No  . Sexual activity: No     Comment: no dietary restrictions, lives alone   Other Topics Concern  . Not on file   Social History Narrative  . No narrative on file    Outpatient Medications Prior to Visit  Medication Sig Dispense Refill  . atorvastatin (LIPITOR) 40 MG tablet TAKE 1 TABLET(40 MG) BY MOUTH DAILY 30 tablet 0  . citalopram (CELEXA) 20 MG tablet TAKE 1 TABLET(20 MG) BY MOUTH DAILY 30 tablet 0  . Cranberry 500 MG CAPS Take 500 mg by mouth daily.    . fexofenadine (ALLEGRA ODT) 30 MG disintegrating tablet Take 1 tablet (30 mg total) by mouth daily. 30 tablet 1  . levothyroxine (SYNTHROID, LEVOTHROID) 75 MCG tablet TAKE 1 TABLET(75 MCG) BY MOUTH DAILY 30 tablet 8  . Multiple Vitamins-Minerals (CENTRUM  SILVER PO) Take 1 tablet by mouth daily.    Marland Kitchen. PROVENTIL HFA 108 (90 Base) MCG/ACT inhaler INHALE 2 PUFFS INTO THE LUNGS EVERY 6 HOURS AS NEEDED FOR WHEEZING OR SHORTNESS OF BREATH 6.7 g 2  . verapamil (CALAN-SR) 180 MG CR tablet TAKE 1 TABLET BY MOUTH EVERY EVENING AT BEDTIME 30 tablet 0  . ALPRAZolam (XANAX) 0.25 MG tablet Take 1 tablet (0.25 mg total) by mouth 2 (two) times daily as needed for anxiety or sleep. 60 tablet 0  . citalopram (CELEXA) 20 MG tablet TAKE 1 TABLET(20 MG) BY MOUTH DAILY 30 tablet 4   Facility-Administered Medications Prior to Visit  Medication Dose Route Frequency Provider Last Rate Last Dose  . pneumococcal 13-valent conjugate vaccine (PREVNAR 13) injection 0.5 mL  0.5 mL Intramuscular Once Bradd CanaryStacey A Bekki Tavenner, MD        Allergies  Allergen Reactions  . Codeine Nausea And Vomiting    Review of Systems  Constitutional: Negative for chills, fever and malaise/fatigue.  HENT: Negative for congestion and hearing loss.   Eyes: Negative for discharge.  Respiratory: Negative for cough, sputum production and shortness of breath.   Cardiovascular: Negative for chest pain, palpitations and leg swelling.  Gastrointestinal: Negative for abdominal pain, blood in stool, constipation, diarrhea, heartburn, nausea and vomiting.  Genitourinary: Negative for dysuria, frequency, hematuria and urgency.  Musculoskeletal: Negative for back pain, falls and myalgias.  Skin: Negative for rash.  Neurological: Negative for dizziness, sensory change, loss of consciousness, weakness and headaches.  Endo/Heme/Allergies: Negative for environmental allergies. Does not bruise/bleed easily.  Psychiatric/Behavioral: Negative for depression and suicidal ideas. The patient is nervous/anxious and has insomnia.        Objective:    Physical Exam  Constitutional: She is oriented to person, place, and time. She appears well-developed and well-nourished. No distress.  HENT:  Head: Normocephalic and  atraumatic.  Eyes: Conjunctivae are normal.  Neck: Neck supple. No thyromegaly present.  Cardiovascular: Normal rate, regular rhythm and normal heart sounds.   No murmur heard. Pulmonary/Chest: Effort normal and breath sounds normal. No respiratory distress.  Abdominal: Soft. Bowel sounds are normal. She exhibits no distension and no mass. There is no tenderness.  Musculoskeletal: She exhibits no edema.  Lymphadenopathy:    She has no cervical adenopathy.  Neurological: She is alert and oriented to person, place, and time.  Skin: Skin is warm and dry.  Psychiatric: She has a normal mood and affect. Her behavior is normal.  BP 138/72   Pulse (!) 56   Temp 98.2 F (36.8 C) (Oral)   Resp 18   Wt 176 lb 12.8 oz (80.2 kg)   SpO2 98%   BMI 32.34 kg/m  Wt Readings from Last 3 Encounters:  05/20/16 176 lb 12.8 oz (80.2 kg)  11/18/15 178 lb (80.7 kg)  08/12/15 176 lb 8 oz (80.1 kg)   BP Readings from Last 3 Encounters:  05/20/16 138/72  11/18/15 138/80  08/12/15 132/70     Immunization History  Administered Date(s) Administered  . Influenza Whole 11/17/2012  . Influenza, High Dose Seasonal PF 11/18/2015  . Influenza,inj,Quad PF,36+ Mos 10/30/2013  . Influenza-Unspecified 12/24/2014  . Pneumococcal Conjugate-13 10/30/2013  . Pneumococcal Polysaccharide-23 02/10/2015  . Tdap 09/04/2012    Health Maintenance  Topic Date Due  . INFLUENZA VACCINE  09/22/2016  . TETANUS/TDAP  09/05/2022  . DEXA SCAN  Completed  . PNA vac Low Risk Adult  Completed    Lab Results  Component Value Date   WBC 6.7 05/20/2016   HGB 13.2 05/20/2016   HCT 38.8 05/20/2016   PLT 210.0 05/20/2016   GLUCOSE 96 05/20/2016   CHOL 145 05/20/2016   TRIG 61.0 05/20/2016   HDL 66.30 05/20/2016   LDLCALC 66 05/20/2016   ALT 19 05/20/2016   AST 23 05/20/2016   NA 138 05/20/2016   K 4.8 05/20/2016   CL 102 05/20/2016   CREATININE 1.06 05/20/2016   BUN 14 05/20/2016   CO2 30 05/20/2016   TSH 0.21  (L) 05/20/2016   INR 0.99 07/24/2012   HGBA1C 5.7 05/20/2016    Lab Results  Component Value Date   TSH 0.21 (L) 05/20/2016   Lab Results  Component Value Date   WBC 6.7 05/20/2016   HGB 13.2 05/20/2016   HCT 38.8 05/20/2016   MCV 94.5 05/20/2016   PLT 210.0 05/20/2016   Lab Results  Component Value Date   NA 138 05/20/2016   K 4.8 05/20/2016   CO2 30 05/20/2016   GLUCOSE 96 05/20/2016   BUN 14 05/20/2016   CREATININE 1.06 05/20/2016   BILITOT 1.0 05/20/2016   ALKPHOS 66 05/20/2016   AST 23 05/20/2016   ALT 19 05/20/2016   PROT 6.9 05/20/2016   ALBUMIN 3.8 05/20/2016   CALCIUM 9.4 05/20/2016   GFR 52.60 (L) 05/20/2016   Lab Results  Component Value Date   CHOL 145 05/20/2016   Lab Results  Component Value Date   HDL 66.30 05/20/2016   Lab Results  Component Value Date   LDLCALC 66 05/20/2016   Lab Results  Component Value Date   TRIG 61.0 05/20/2016   Lab Results  Component Value Date   CHOLHDL 2 05/20/2016   Lab Results  Component Value Date   HGBA1C 5.7 05/20/2016         Assessment & Plan:   Problem List Items Addressed This Visit    Hypothyroidism    On Levothyroxine, continue to monitor      Relevant Orders   TSH (Completed)   HTN (hypertension) - Primary    Well controlled, no changes to meds. Encouraged heart healthy diet such as the DASH diet and exercise as tolerated.       Relevant Orders   CBC (Completed)   Hyperlipidemia    Tolerating statin, encouraged heart healthy diet, avoid trans fats, minimize simple carbs and saturated fats. Increase exercise as tolerated      Relevant Orders   Lipid panel (Completed)  Depression with anxiety    Struggles some still but feels her meds are adequate at this time, no changes      Hyperglycemia    hgba1c acceptable, minimize simple carbs. Increase exercise as tolerated.       Relevant Orders   Hemoglobin A1c (Completed)   Constipation    Encouraged increased hydration and  fiber in diet. Daily probiotics. If bowels not moving can use MOM 2 tbls po in 4 oz of warm prune juice by mouth every 2-3 days. If no results then repeat in 4 hours with  Dulcolax suppository pr, may repeat again in 4 more hours as needed. Seek care if symptoms worsen. Consider daily Miralax and/or Dulcolax if symptoms persist. Increase Miralax to twice daily and add Benefiber to each dose consider referral for further work up if no improvement      Preventative health care    Patient encouraged to maintain heart healthy diet, regular exercise, adequate sleep. Consider daily probiotics. Take medications as prescribed       Other Visit Diagnoses    Benign essential HTN       Relevant Medications   ALPRAZolam (XANAX) 0.25 MG tablet   Other Relevant Orders   Comprehensive metabolic panel (Completed)      I am having Ms. Reddin maintain her Multiple Vitamins-Minerals (CENTRUM SILVER PO), Cranberry, fexofenadine, levothyroxine, citalopram, PROVENTIL HFA, atorvastatin, verapamil, and ALPRAZolam. We will continue to administer pneumococcal 13-valent conjugate vaccine.  Meds ordered this encounter  Medications  . ALPRAZolam (XANAX) 0.25 MG tablet    Sig: Take 1 tablet (0.25 mg total) by mouth 2 (two) times daily as needed for anxiety or sleep.    Dispense:  60 tablet    Refill:  2    CMA served as scribe during this visit. History, Physical and Plan performed by medical provider. Documentation and orders reviewed and attested to.  Danise Edge, MD

## 2016-05-20 NOTE — Progress Notes (Signed)
Pre visit review using our clinic review tool, if applicable. No additional management support is needed unless otherwise documented below in the visit note. 

## 2016-05-20 NOTE — Patient Instructions (Signed)
Preventive Care 65 Years and Older, Female Preventive care refers to lifestyle choices and visits with your health care provider that can promote health and wellness. What does preventive care include?  A yearly physical exam. This is also called an annual well check.  Dental exams once or twice a year.  Routine eye exams. Ask your health care provider how often you should have your eyes checked.  Personal lifestyle choices, including:  Daily care of your teeth and gums.  Regular physical activity.  Eating a healthy diet.  Avoiding tobacco and drug use.  Limiting alcohol use.  Practicing safe sex.  Taking low-dose aspirin every day.  Taking vitamin and mineral supplements as recommended by your health care provider. What happens during an annual well check? The services and screenings done by your health care provider during your annual well check will depend on your age, overall health, lifestyle risk factors, and family history of disease. Counseling  Your health care provider may ask you questions about your:  Alcohol use.  Tobacco use.  Drug use.  Emotional well-being.  Home and relationship well-being.  Sexual activity.  Eating habits.  History of falls.  Memory and ability to understand (cognition).  Work and work environment.  Reproductive health. Screening  You may have the following tests or measurements:  Height, weight, and BMI.  Blood pressure.  Lipid and cholesterol levels. These may be checked every 5 years, or more frequently if you are over 50 years old.  Skin check.  Lung cancer screening. You may have this screening every year starting at age 55 if you have a 30-pack-year history of smoking and currently smoke or have quit within the past 15 years.  Fecal occult blood test (FOBT) of the stool. You may have this test every year starting at age 50.  Flexible sigmoidoscopy or colonoscopy. You may have a sigmoidoscopy every 5 years or  a colonoscopy every 10 years starting at age 50.  Hepatitis C blood test.  Hepatitis B blood test.  Sexually transmitted disease (STD) testing.  Diabetes screening. This is done by checking your blood sugar (glucose) after you have not eaten for a while (fasting). You may have this done every 1-3 years.  Bone density scan. This is done to screen for osteoporosis. You may have this done starting at age 81.  Mammogram. This may be done every 1-2 years. Talk to your health care provider about how often you should have regular mammograms. Talk with your health care provider about your test results, treatment options, and if necessary, the need for more tests. Vaccines  Your health care provider may recommend certain vaccines, such as:  Influenza vaccine. This is recommended every year.  Tetanus, diphtheria, and acellular pertussis (Tdap, Td) vaccine. You may need a Td booster every 10 years.  Varicella vaccine. You may need this if you have not been vaccinated.  Zoster vaccine. You may need this after age 60.  Measles, mumps, and rubella (MMR) vaccine. You may need at least one dose of MMR if you were born in 1957 or later. You may also need a second dose.  Pneumococcal 13-valent conjugate (PCV13) vaccine. One dose is recommended after age 81.  Pneumococcal polysaccharide (PPSV23) vaccine. One dose is recommended after age 81.  Meningococcal vaccine. You may need this if you have certain conditions.  Hepatitis A vaccine. You may need this if you have certain conditions or if you travel or work in places where you may be exposed to   hepatitis A.  Hepatitis B vaccine. You may need this if you have certain conditions or if you travel or work in places where you may be exposed to hepatitis B.  Haemophilus influenzae type b (Hib) vaccine. You may need this if you have certain conditions. Talk to your health care provider about which screenings and vaccines you need and how often you need  them. This information is not intended to replace advice given to you by your health care provider. Make sure you discuss any questions you have with your health care provider. Document Released: 03/07/2015 Document Revised: 10/29/2015 Document Reviewed: 12/10/2014 Elsevier Interactive Patient Education  2017 [employer]  

## 2016-05-23 NOTE — Assessment & Plan Note (Signed)
Struggles some still but feels her meds are adequate at this time, no changes

## 2016-05-23 NOTE — Progress Notes (Addendum)
Subjective:  I acted as a Neurosurgeon for Dr. Abner Greenspan. Alyssa Williamson, [place]   Patient ID: Alyssa Williamson, female    DOB: [birth year], 83 y.o.   MRN: [id number]  Chief Complaint  Patient presents with  . Follow-up  . Hypertension  . Hyperlipidemia    Hypertension  Pertinent negatives include no chest pain, headaches, malaise/fatigue, palpitations or shortness of breath.  Hyperlipidemia  Pertinent negatives include no chest pain, myalgias or shortness of breath.    Patient is in today for Annual Preventative Exam and follow up on hypertension, hyperlipidemia and other medical concerns. Patient continues to struggle with helping her mentally ill daughter. Her daughter calls and leaves harassing phone calls regularly and is having trouble with anxiety and insomnia as a result she takes Alprazolam 0.25 mg tabs, 1/2 to 1 tab and does well without side effects. No recent febrile illness or acute hospitalizations. Denies CP/palp/SOB/HA/congestion/fevers/GI or GU c/o. Taking meds as prescribed. Doing well with ADLs at home and trying to maintain a heart healthy diet.   Patient Care Team: Bradd Canary, MD as PCP - General (Family Medicine) Su Grand, MD as Consulting Physician (Urology) Lupe Carney Karolee Stamps, [place] (Dermatology) Lewayne Bunting, MD as Consulting Physician (Cardiology)   Past Medical History:  Diagnosis Date  . Allergic rhinitis   . Anemia   . Anxiety   . Arthritis    bilateral knee  . Cervical cancer screening 10/30/2013   Menarche at 15 Regular and moderate flow No history of abnormal pap in past G4P3, s/p 3 svd and 1 MC No history of abnormal MGM Noconcerns today No gyn surgeries  . Constipation 11/18/2015  . Depression with anxiety   . Diverticulitis   . Heart murmur   . Hyperlipidemia   . Hypertension   . Hypothyroid   . Left knee DJD   . Medicare annual wellness visit, subsequent 10/30/2013  . PONV (postoperative nausea and vomiting)   . Preventative health care 05/20/2016     Past Surgical History:  Procedure Laterality Date  . ABDOMINAL HYSTERECTOMY    . bladder tack  04/2010   Dr Marciano Sequin  . GANGLION CYST EXCISION    . JOINT REPLACEMENT Right   . REPLACEMENT TOTAL KNEE  10/2010   right knee-[facility]   . TONSILLECTOMY    . TOTAL KNEE ARTHROPLASTY Left 07/26/2012   Procedure: TOTAL KNEE ARTHROPLASTY;  Surgeon: Loreta Ave, MD;  Location: [facility] OR;  Service: Orthopedics;  Laterality: Left;    Family History  Problem Relation Age of Onset  . Alcohol abuse Father   . Breast cancer Sister   . Cancer Sister     bladder  . Glaucoma Sister   . Arthritis Daughter   . Diabetes Daughter     type 2  . Cancer Daughter     brain cancer, diagnosed 29 years.agp  . Mental illness Daughter     s/p craniotomies, gamma knife for tumors. numerous shunts.  . Heart failure Mother   . Cancer Brother     glioblastoma   . Glaucoma Brother   . Diverticulosis Sister   . Cancer Sister     liver  . Cancer Brother     melanoma, mets to lung and brain and intestine  . Alcohol abuse Brother   . Cancer Brother     liver  . Glaucoma Maternal Grandfather   . Cancer Sister     breast  . Colon cancer Neg Hx   . Hypertension  Neg Hx     Social History   Social History  . Marital status: Widowed    Spouse name: N/A  . Number of children: 3  . Years of education: N/A   Occupational History  . Not on file.   Social History Main Topics  . Smoking status: Never Smoker  . Smokeless tobacco: Never Used  . Alcohol use Yes     Comment: rare  . Drug use: No  . Sexual activity: No     Comment: no dietary restrictions, lives alone   Other Topics Concern  . Not on file   Social History Narrative  . No narrative on file    Outpatient Medications Prior to Visit  Medication Sig Dispense Refill  . atorvastatin (LIPITOR) 40 MG tablet TAKE 1 TABLET(40 MG) BY MOUTH DAILY 30 tablet 0  . citalopram (CELEXA) 20 MG tablet TAKE 1 TABLET(20 MG) BY MOUTH DAILY 30  tablet 0  . Cranberry 500 MG CAPS Take 500 mg by mouth daily.    . fexofenadine (ALLEGRA ODT) 30 MG disintegrating tablet Take 1 tablet (30 mg total) by mouth daily. 30 tablet 1  . levothyroxine (SYNTHROID, LEVOTHROID) 75 MCG tablet TAKE 1 TABLET(75 MCG) BY MOUTH DAILY 30 tablet 8  . Multiple Vitamins-Minerals (CENTRUM SILVER PO) Take 1 tablet by mouth daily.    Marland Kitchen PROVENTIL HFA 108 (90 Base) MCG/ACT inhaler INHALE 2 PUFFS INTO THE LUNGS EVERY 6 HOURS AS NEEDED FOR WHEEZING OR SHORTNESS OF BREATH 6.7 g 2  . verapamil (CALAN-SR) 180 MG CR tablet TAKE 1 TABLET BY MOUTH EVERY EVENING AT BEDTIME 30 tablet 0  . ALPRAZolam (XANAX) 0.25 MG tablet Take 1 tablet (0.25 mg total) by mouth 2 (two) times daily as needed for anxiety or sleep. 60 tablet 0  . citalopram (CELEXA) 20 MG tablet TAKE 1 TABLET(20 MG) BY MOUTH DAILY 30 tablet 4   Facility-Administered Medications Prior to Visit  Medication Dose Route Frequency Provider Last Rate Last Dose  . pneumococcal 13-valent conjugate vaccine (PREVNAR 13) injection 0.5 mL  0.5 mL Intramuscular Once Bradd Canary, MD        Allergies  Allergen Reactions  . Codeine Nausea And Vomiting    Review of Systems  Constitutional: Negative for chills, fever and malaise/fatigue.  HENT: Negative for congestion and hearing loss.   Eyes: Negative for discharge.  Respiratory: Negative for cough, sputum production and shortness of breath.   Cardiovascular: Negative for chest pain, palpitations and leg swelling.  Gastrointestinal: Positive for constipation. Negative for abdominal pain, blood in stool, diarrhea, heartburn, melena, nausea and vomiting.  Genitourinary: Negative for dysuria, frequency, hematuria and urgency.  Musculoskeletal: Negative for back pain, falls and myalgias.  Skin: Negative for rash.  Neurological: Negative for dizziness, sensory change, loss of consciousness, weakness and headaches.  Endo/Heme/Allergies: Negative for environmental allergies.  Does not bruise/bleed easily.  Psychiatric/Behavioral: Negative for depression and suicidal ideas. The patient is nervous/anxious and has insomnia.        Objective:    Physical Exam  Constitutional: She is oriented to person, place, and time. She appears well-developed and well-nourished. No distress.  HENT:  Head: Normocephalic and atraumatic.  Eyes: Conjunctivae are normal.  Neck: Neck supple. No thyromegaly present.  Cardiovascular: Normal rate, regular rhythm and normal heart sounds.   No murmur heard. Pulmonary/Chest: Effort normal and breath sounds normal. No respiratory distress.  Abdominal: Soft. Bowel sounds are normal. She exhibits no distension and no mass. There is no tenderness.  Musculoskeletal: She exhibits no edema.  Lymphadenopathy:    She has no cervical adenopathy.  Neurological: She is alert and oriented to person, place, and time.  Skin: Skin is warm and dry.  Psychiatric: She has a normal mood and affect. Her behavior is normal.    BP 138/72   Pulse (!) 56   Temp 98.2 F (36.8 C) (Oral)   Resp 18   Wt 176 lb 12.8 oz (80.2 kg)   SpO2 98%   BMI 32.34 kg/m  Wt Readings from Last 3 Encounters:  05/20/16 176 lb 12.8 oz (80.2 kg)  11/18/15 178 lb (80.7 kg)  08/12/15 176 lb 8 oz (80.1 kg)   BP Readings from Last 3 Encounters:  05/20/16 138/72  11/18/15 138/80  08/12/15 132/70     Immunization History  Administered Date(s) Administered  . Influenza Whole 11/17/2012  . Influenza, High Dose Seasonal PF 11/18/2015  . Influenza,inj,Quad PF,36+ Mos 10/30/2013  . Influenza-Unspecified 12/24/2014  . Pneumococcal Conjugate-13 10/30/2013  . Pneumococcal Polysaccharide-23 02/10/2015  . Tdap 09/04/2012    Health Maintenance  Topic Date Due  . INFLUENZA VACCINE  09/22/2016  . TETANUS/TDAP  09/05/2022  . DEXA SCAN  Completed  . PNA vac Low Risk Adult  Completed    Lab Results  Component Value Date   WBC 6.7 05/20/2016   HGB 13.2 05/20/2016   HCT  38.8 05/20/2016   PLT 210.0 05/20/2016   GLUCOSE 96 05/20/2016   CHOL 145 05/20/2016   TRIG 61.0 05/20/2016   HDL 66.30 05/20/2016   LDLCALC 66 05/20/2016   ALT 19 05/20/2016   AST 23 05/20/2016   NA 138 05/20/2016   K 4.8 05/20/2016   CL 102 05/20/2016   CREATININE 1.06 05/20/2016   BUN 14 05/20/2016   CO2 30 05/20/2016   TSH 0.21 (L) 05/20/2016   INR 0.99 07/24/2012   HGBA1C 5.7 05/20/2016    Lab Results  Component Value Date   TSH 0.21 (L) 05/20/2016   Lab Results  Component Value Date   WBC 6.7 05/20/2016   HGB 13.2 05/20/2016   HCT 38.8 05/20/2016   MCV 94.5 05/20/2016   PLT 210.0 05/20/2016   Lab Results  Component Value Date   NA 138 05/20/2016   K 4.8 05/20/2016   CO2 30 05/20/2016   GLUCOSE 96 05/20/2016   BUN 14 05/20/2016   CREATININE 1.06 05/20/2016   BILITOT 1.0 05/20/2016   ALKPHOS 66 05/20/2016   AST 23 05/20/2016   ALT 19 05/20/2016   PROT 6.9 05/20/2016   ALBUMIN 3.8 05/20/2016   CALCIUM 9.4 05/20/2016   GFR 52.60 (L) 05/20/2016   Lab Results  Component Value Date   CHOL 145 05/20/2016   Lab Results  Component Value Date   HDL 66.30 05/20/2016   Lab Results  Component Value Date   LDLCALC 66 05/20/2016   Lab Results  Component Value Date   TRIG 61.0 05/20/2016   Lab Results  Component Value Date   CHOLHDL 2 05/20/2016   Lab Results  Component Value Date   HGBA1C 5.7 05/20/2016         Assessment & Plan:   Problem List Items Addressed This Visit    Hypothyroidism    On Levothyroxine, continue to monitor      Relevant Orders   TSH (Completed)   HTN (hypertension) - Primary    Well controlled, no changes to meds. Encouraged heart healthy diet such as the DASH diet and exercise as  tolerated.       Relevant Orders   CBC (Completed)   Hyperlipidemia    Tolerating statin, encouraged heart healthy diet, avoid trans fats, minimize simple carbs and saturated fats. Increase exercise as tolerated      Relevant Orders    Lipid panel (Completed)   Depression with anxiety    Struggles some still but feels her meds are adequate at this time, no changes      Hyperglycemia    hgba1c acceptable, minimize simple carbs. Increase exercise as tolerated.       Relevant Orders   Hemoglobin A1c (Completed)   Constipation    Encouraged increased hydration and fiber in diet. Daily probiotics. If bowels not moving can use MOM 2 tbls po in 4 oz of warm prune juice by mouth every 2-3 days. If no results then repeat in 4 hours with  Dulcolax suppository pr, may repeat again in 4 more hours as needed. Seek care if symptoms worsen. Consider daily Miralax and/or Dulcolax if symptoms persist. Increase Miralax to twice daily and add Benefiber to each dose consider referral for further work up if no improvement      Preventative health care    Patient encouraged to maintain heart healthy diet, regular exercise, adequate sleep. Consider daily probiotics. Take medications as prescribed       Other Visit Diagnoses    Benign essential HTN       Relevant Medications   ALPRAZolam (XANAX) 0.25 MG tablet   Other Relevant Orders   Comprehensive metabolic panel (Completed)      I am having Ms. Wieting maintain her Multiple Vitamins-Minerals (CENTRUM SILVER PO), Cranberry, fexofenadine, levothyroxine, citalopram, PROVENTIL HFA, atorvastatin, verapamil, and ALPRAZolam. We will continue to administer pneumococcal 13-valent conjugate vaccine.  Meds ordered this encounter  Medications  . ALPRAZolam (XANAX) 0.25 MG tablet    Sig: Take 1 tablet (0.25 mg total) by mouth 2 (two) times daily as needed for anxiety or sleep.    Dispense:  60 tablet    Refill:  2    CMA served as scribe during this visit. History, Physical and Plan performed by medical provider. Documentation and orders reviewed and attested to.  Danise Edge, MD

## 2016-05-27 ENCOUNTER — Other Ambulatory Visit: Payer: Self-pay | Admitting: Family Medicine

## 2016-05-31 ENCOUNTER — Other Ambulatory Visit: Payer: Self-pay | Admitting: Family Medicine

## 2016-05-31 DIAGNOSIS — E039 Hypothyroidism, unspecified: Secondary | ICD-10-CM

## 2016-06-02 ENCOUNTER — Other Ambulatory Visit: Payer: Self-pay | Admitting: Family Medicine

## 2016-06-03 ENCOUNTER — Other Ambulatory Visit (INDEPENDENT_AMBULATORY_CARE_PROVIDER_SITE_OTHER): Payer: Medicare Other

## 2016-06-03 DIAGNOSIS — E039 Hypothyroidism, unspecified: Secondary | ICD-10-CM | POA: Diagnosis not present

## 2016-06-03 LAB — TSH: TSH: 0.23 u[IU]/mL — ABNORMAL LOW (ref 0.35–4.50)

## 2016-06-03 LAB — T4, FREE: FREE T4: 1.2 ng/dL (ref 0.60–1.60)

## 2016-06-03 LAB — T3, FREE: T3 FREE: 3.1 pg/mL (ref 2.3–4.2)

## 2016-06-30 ENCOUNTER — Other Ambulatory Visit: Payer: Self-pay | Admitting: Family Medicine

## 2016-08-09 ENCOUNTER — Encounter: Payer: Self-pay | Admitting: Family Medicine

## 2016-08-09 ENCOUNTER — Ambulatory Visit (HOSPITAL_BASED_OUTPATIENT_CLINIC_OR_DEPARTMENT_OTHER)
Admission: RE | Admit: 2016-08-09 | Discharge: 2016-08-09 | Disposition: A | Discharge status: Home / Self Care | Payer: Medicare Other | Source: Ambulatory Visit | Attending: Family Medicine | Admitting: Family Medicine

## 2016-08-09 ENCOUNTER — Other Ambulatory Visit: Payer: Self-pay | Admitting: Family Medicine

## 2016-08-09 ENCOUNTER — Ambulatory Visit (INDEPENDENT_AMBULATORY_CARE_PROVIDER_SITE_OTHER): Payer: Medicare Other | Admitting: Family Medicine

## 2016-08-09 VITALS — BP 138/82 | HR 110 | Temp 98.2°F | Ht 62.0 in | Wt 178.5 lb

## 2016-08-09 DIAGNOSIS — M79641 Pain in right hand: Secondary | ICD-10-CM

## 2016-08-09 DIAGNOSIS — S6291XA Unspecified fracture of right wrist and hand, initial encounter for closed fracture: Secondary | ICD-10-CM

## 2016-08-09 NOTE — Patient Instructions (Signed)
Hand Pain  Many things can cause hand pain. Some common causes are:  ? An injury.  ? Repeating the same movement with your hand over and over (overuse).  ? Osteoporosis.  ? Arthritis.  ? Lumps in the tendons or joints of the hand and wrist (ganglion cysts).  ? Infection.  Follow these instructions at home:  Pay attention to any changes in your symptoms. Take these actions to help with your discomfort:  ? If directed, put ice on the affected area:  ? Put ice in a plastic bag.  ? Place a towel between your skin and the bag.  ? Leave the ice on for 15?20 minutes, 3?4 times a day for 2 days.  ? Take over-the-counter and prescription medicines only as told by your health care provider.  ? Minimize stress on your hands and wrists as much as possible.  ? Take breaks from repetitive activity often.  ? Do stretches as told by your health care provider.  ? Do not do activities that make your pain worse.  Contact a health care provider if:  ? Your pain does not get better after a few days of self-care.  ? Your pain gets worse.  ? Your pain affects your ability to do your daily activities.  Get help right away if:  ? Your hand becomes warm, red, or swollen.  ? Your hand is numb or tingling.  ? Your hand is extremely swollen or deformed.  ? Your hand or fingers turn white or blue.  ? You cannot move your hand, wrist, or fingers.  This information is not intended to replace advice given to you by your health care provider. Make sure you discuss any questions you have with your health care provider.  Document Released: 03/07/2015 Document Revised: 07/17/2015 Document Reviewed: 03/06/2014  Elsevier Interactive Patient Education ? 2018 [employer]vier Inc[employer]

## 2016-08-09 NOTE — Progress Notes (Signed)
Pre visit review using our clinic review tool, if applicable. No additional management support is needed unless otherwise documented below in the visit note. 

## 2016-08-09 NOTE — Progress Notes (Signed)
Patient ID: Alyssa Williamson, female    DOB: [birth year]  Age: 83 y.o. MRN: [id number]    Subjective:  Subjective  HPI [name] presents for c/o R hand pain after a fall over the weekend.  She leaned on a folding table and it flew up and hit her in the face and then she fell on her hand that was closed in a fist.  Hand is black and blue and swollen -- she has 2 black eyes -- her vision is fine, no dizziness or blurry vision.   Review of Systems  Constitutional: Negative for fever.  HENT: Negative for congestion.   Respiratory: Negative for shortness of breath.   Cardiovascular: Negative for chest pain, palpitations and leg swelling.  Gastrointestinal: Negative for abdominal pain, blood in stool and nausea.  Genitourinary: Negative for dysuria and frequency.  Musculoskeletal: Positive for joint swelling. Negative for back pain.  Skin: Negative for rash.  Allergic/Immunologic: Negative for environmental allergies.  Neurological: Negative for dizziness and headaches.  Psychiatric/Behavioral: The patient is not nervous/anxious.     History Past Medical History:  Diagnosis Date  . Allergic rhinitis   . Anemia   . Anxiety   . Arthritis    bilateral knee  . Cervical cancer screening 10/30/2013   Menarche at 15 Regular and moderate flow No history of abnormal pap in past G4P3, s/p 3 svd and 1 MC No history of abnormal MGM Noconcerns today No gyn surgeries  . Constipation 11/18/2015  . Depression with anxiety   . Diverticulitis   . Heart murmur   . Hyperlipidemia   . Hypertension   . Hypothyroid   . Left knee DJD   . Medicare annual wellness visit, subsequent 10/30/2013  . PONV (postoperative nausea and vomiting)   . Preventative health care 05/20/2016    She has a past surgical history that includes Replacement total knee (10/2010); bladder tack (04/2010); Abdominal hysterectomy; Tonsillectomy; Joint replacement (Right); Ganglion cyst excision; and Total knee arthroplasty (Left,  07/26/2012).   Her family history includes Alcohol abuse in her brother and father; Arthritis in her daughter; Breast cancer in her sister; Cancer in her brother, brother, brother, daughter, sister, sister, and sister; Diabetes in her daughter; Diverticulosis in her sister; Glaucoma in her brother, maternal grandfather, and sister; Heart failure in her mother; Mental illness in her daughter.She reports that she has never smoked. She has never used smokeless tobacco. She reports that she drinks alcohol. She reports that she does not use drugs.  Current Outpatient Prescriptions on File Prior to Visit  Medication Sig Dispense Refill  . ALPRAZolam (XANAX) 0.25 MG tablet Take 1 tablet (0.25 mg total) by mouth 2 (two) times daily as needed for anxiety or sleep. 60 tablet 2  . atorvastatin (LIPITOR) 40 MG tablet TAKE 1 TABLET(40 MG) BY MOUTH DAILY 30 tablet 5  . citalopram (CELEXA) 20 MG tablet TAKE 1 TABLET(20 MG) BY MOUTH DAILY 30 tablet 0  . Cranberry 500 MG CAPS Take 500 mg by mouth daily.    . fexofenadine (ALLEGRA ODT) 30 MG disintegrating tablet Take 1 tablet (30 mg total) by mouth daily. 30 tablet 1  . levothyroxine (SYNTHROID, LEVOTHROID) 75 MCG tablet TAKE 1 TABLET(75 MCG) BY MOUTH DAILY 30 tablet 6  . Multiple Vitamins-Minerals (CENTRUM SILVER PO) Take 1 tablet by mouth daily.    Marland Kitchen PROVENTIL HFA 108 (90 Base) MCG/ACT inhaler INHALE 2 PUFFS INTO THE LUNGS EVERY 6 HOURS AS NEEDED FOR WHEEZING OR SHORTNESS OF BREATH  6.7 g 2  . verapamil (CALAN-SR) 180 MG CR tablet TAKE 1 TABLET BY MOUTH EVERY EVENING AT BEDTIME 30 tablet 5   Current Facility-Administered Medications on File Prior to Visit  Medication Dose Route Frequency Provider Last Rate Last Dose  . pneumococcal 13-valent conjugate vaccine (PREVNAR 13) injection 0.5 mL  0.5 mL Intramuscular Once Bradd Canary, MD         Objective:  Objective  Physical Exam  Constitutional: She appears well-developed and well-nourished. No distress.    HENT:  Head:    Musculoskeletal:       Hands: Skin: She is not diaphoretic.  Nursing note and vitals reviewed.  BP 138/82 (BP Location: Left Arm, Patient Position: Sitting, Cuff Size: Normal)   Pulse (!) 110   Temp 98.2 F (36.8 C) (Oral)   Ht 5\' 2"  (1.575 m)   Wt 178 lb 8 oz (81 kg)   SpO2 98%   BMI 32.65 kg/m  Wt Readings from Last 3 Encounters:  08/09/16 178 lb 8 oz (81 kg)  05/20/16 176 lb 12.8 oz (80.2 kg)  11/18/15 178 lb (80.7 kg)     Lab Results  Component Value Date   WBC 6.7 05/20/2016   HGB 13.2 05/20/2016   HCT 38.8 05/20/2016   PLT 210.0 05/20/2016   GLUCOSE 96 05/20/2016   CHOL 145 05/20/2016   TRIG 61.0 05/20/2016   HDL 66.30 05/20/2016   LDLCALC 66 05/20/2016   ALT 19 05/20/2016   AST 23 05/20/2016   NA 138 05/20/2016   K 4.8 05/20/2016   CL 102 05/20/2016   CREATININE 1.06 05/20/2016   BUN 14 05/20/2016   CO2 30 05/20/2016   TSH 0.23 (L) 06/03/2016   INR 0.99 07/24/2012   HGBA1C 5.7 05/20/2016    Mm Diag Breast Tomo Uni Right  Result Date: 09/22/2015 CLINICAL DATA:  Calcifications in the posterior, inferior right breast, slightly medially and possible mass in the medial right breast on a recent 2D screening mammogram. EXAM: 2D DIGITAL DIAGNOSTIC UNILATERAL RIGHT MAMMOGRAM WITH CAD AND ADJUNCT TOMO COMPARISON:  Previous exam(s). ACR Breast Density Category c: The breast tissue is heterogeneously dense, which may obscure small masses. FINDINGS: 2D and 3D tomographic images of the right breast demonstrate normal appearing fibroglandular tissue at the location of the recently suspected mass in the medial aspect of the breast. 2D spot magnification views of the right breast demonstrate loosely grouped small, rounded calcifications with central lucencies, shown to be within the skin in the inferior aspect of the breast in the true lateral projection. Mammographic images were processed with CAD. IMPRESSION: 1. No evidence of malignancy. 2. The recently  suspected mass in the medial right breast was overlapping of normal fibroglandular tissue. 3. The recently demonstrated calcifications in the posterior, inferior right breast are benign dermal calcifications. RECOMMENDATION: Bilateral screening mammogram in 11 months. That will be 1 year since mammographic evaluation of the left breast. I have discussed the findings and recommendations with the patient. Results were also provided in writing at the conclusion of the visit. If applicable, a reminder letter will be sent to the patient regarding the next appointment. BI-RADS CATEGORY  2: Benign. Electronically Signed   By: Beckie Salts M.D.   On: 09/22/2015 12:35    Assessment & Plan:  Plan  I am having Ms. Boise maintain her Multiple Vitamins-Minerals (CENTRUM SILVER PO), Cranberry, fexofenadine, citalopram, PROVENTIL HFA, ALPRAZolam, levothyroxine, verapamil, and atorvastatin. We will continue to administer pneumococcal 13-valent conjugate vaccine.  No orders of the defined types were placed in this encounter.   Problem List Items Addressed This Visit    None    Visit Diagnoses    Right hand pain    -  Primary   Relevant Orders   DG Hand Complete Right (Completed)    ace wrap Xray Referral to ortho-- [facility] if needed   Follow-up: Return if symptoms worsen or fail to improve.  Donato SchultzYvonne R Lowne Chase, DO

## 2016-08-26 ENCOUNTER — Other Ambulatory Visit: Payer: Self-pay | Admitting: Family Medicine

## 2016-09-20 ENCOUNTER — Telehealth: Payer: Self-pay | Admitting: Family Medicine

## 2016-09-20 NOTE — Telephone Encounter (Signed)
Pt called in because she said that she have a UTI. She would like to come in to give a UA and receive a Rx if possible.    Please advise.

## 2016-09-22 NOTE — Telephone Encounter (Signed)
Complete

## 2016-09-24 ENCOUNTER — Other Ambulatory Visit: Payer: Self-pay | Admitting: Family Medicine

## 2016-09-28 ENCOUNTER — Other Ambulatory Visit: Payer: Self-pay | Admitting: Family Medicine

## 2016-09-28 DIAGNOSIS — I10 Essential (primary) hypertension: Secondary | ICD-10-CM

## 2016-09-28 NOTE — Telephone Encounter (Signed)
Requesting:   alprazolam Contract    04/12/2016 UDS   Low risk on 10/10/2016 Last OV    05/20/2016 Last Refill   #60 with 2 refillls    Please Advise

## 2016-09-30 NOTE — Telephone Encounter (Signed)
Faxed hardcopy for alprazolam to [facility]

## 2016-11-08 ENCOUNTER — Telehealth: Payer: Self-pay | Admitting: Family Medicine

## 2016-11-08 ENCOUNTER — Encounter (HOSPITAL_BASED_OUTPATIENT_CLINIC_OR_DEPARTMENT_OTHER): Payer: Self-pay

## 2016-11-08 ENCOUNTER — Emergency Department (HOSPITAL_BASED_OUTPATIENT_CLINIC_OR_DEPARTMENT_OTHER)
Admission: EM | Admit: 2016-11-08 | Discharge: 2016-11-08 | Disposition: A | Discharge status: Home / Self Care | Payer: Medicare Other | Attending: Emergency Medicine | Admitting: Emergency Medicine

## 2016-11-08 ENCOUNTER — Emergency Department (HOSPITAL_BASED_OUTPATIENT_CLINIC_OR_DEPARTMENT_OTHER): Payer: Medicare Other

## 2016-11-08 DIAGNOSIS — Z7901 Long term (current) use of anticoagulants: Secondary | ICD-10-CM | POA: Insufficient documentation

## 2016-11-08 DIAGNOSIS — E039 Hypothyroidism, unspecified: Secondary | ICD-10-CM | POA: Insufficient documentation

## 2016-11-08 DIAGNOSIS — Z79899 Other long term (current) drug therapy: Secondary | ICD-10-CM | POA: Diagnosis not present

## 2016-11-08 DIAGNOSIS — R9431 Abnormal electrocardiogram [ECG] [EKG]: Secondary | ICD-10-CM | POA: Diagnosis present

## 2016-11-08 DIAGNOSIS — Z96651 Presence of right artificial knee joint: Secondary | ICD-10-CM | POA: Insufficient documentation

## 2016-11-08 DIAGNOSIS — I10 Essential (primary) hypertension: Secondary | ICD-10-CM | POA: Insufficient documentation

## 2016-11-08 DIAGNOSIS — I4891 Unspecified atrial fibrillation: Secondary | ICD-10-CM | POA: Insufficient documentation

## 2016-11-08 LAB — CBC WITH DIFFERENTIAL/PLATELET
BASOS ABS: 0 10*3/uL (ref 0.0–0.1)
BASOS PCT: 1 %
EOS PCT: 3 %
Eosinophils Absolute: 0.2 10*3/uL (ref 0.0–0.7)
HEMATOCRIT: 39.5 % (ref 36.0–46.0)
Hemoglobin: 13.7 g/dL (ref 12.0–15.0)
Lymphocytes Relative: 28 %
Lymphs Abs: 2.1 10*3/uL (ref 0.7–4.0)
MCH: 32.2 pg (ref 26.0–34.0)
MCHC: 34.7 g/dL (ref 30.0–36.0)
MCV: 92.7 fL (ref 78.0–100.0)
MONO ABS: 0.7 10*3/uL (ref 0.1–1.0)
Monocytes Relative: 9 %
NEUTROS ABS: 4.4 10*3/uL (ref 1.7–7.7)
Neutrophils Relative %: 59 %
PLATELETS: 211 10*3/uL (ref 150–400)
RBC: 4.26 MIL/uL (ref 3.87–5.11)
RDW: 12.8 % (ref 11.5–15.5)
WBC: 7.4 10*3/uL (ref 4.0–10.5)

## 2016-11-08 LAB — COMPREHENSIVE METABOLIC PANEL
ALBUMIN: 3.9 g/dL (ref 3.5–5.0)
ALT: 28 U/L (ref 14–54)
AST: 29 U/L (ref 15–41)
Alkaline Phosphatase: 75 U/L (ref 38–126)
Anion gap: 8 (ref 5–15)
BUN: 18 mg/dL (ref 6–20)
CHLORIDE: 102 mmol/L (ref 101–111)
CO2: 26 mmol/L (ref 22–32)
Calcium: 8.8 mg/dL — ABNORMAL LOW (ref 8.9–10.3)
Creatinine, Ser: 1.04 mg/dL — ABNORMAL HIGH (ref 0.44–1.00)
GFR calc Af Amer: 56 mL/min — ABNORMAL LOW (ref >60)
GFR calc non Af Amer: 48 mL/min — ABNORMAL LOW (ref >60)
GLUCOSE: 98 mg/dL (ref 65–99)
POTASSIUM: 4 mmol/L (ref 3.5–5.1)
Sodium: 136 mmol/L (ref 135–145)
Total Bilirubin: 0.9 mg/dL (ref 0.3–1.2)
Total Protein: 7.3 g/dL (ref 6.5–8.1)

## 2016-11-08 LAB — TSH: TSH: 0.883 u[IU]/mL (ref 0.350–4.500)

## 2016-11-08 LAB — MAGNESIUM: Magnesium: 2 mg/dL (ref 1.7–2.4)

## 2016-11-08 MED ORDER — APIXABAN 5 MG PO TABS: 5.0000 mg | ORAL_TABLET | Freq: Two times a day (BID) | ORAL | 0 refills | Status: DC

## 2016-11-08 NOTE — Discharge Instructions (Signed)
You have new onset atrial fibrillation. You are started on a blood thinner for your increased risk for stroke. Your verapimil is controlling your rate adequately, so continue this.   You are at increased risk of bleeding. Please come to ED for evaluation if you hit your head on this medication, have blood in your stool or black tarry stools, or uncontrolled bleeding.  You are given referral to afib clinic to be seen in the next few days. If you do not hear a call, please call phone number provided.   Return for worsening symptoms, including confusion, fatigue or weakness, difficulty breathing, chest pain or any other symptoms concerning to you.

## 2016-11-08 NOTE — ED Provider Notes (Signed)
[facility] Provider Note   CSN: [id number] Arrival date & time: 11/08/16  1605     History   Chief Complaint Chief Complaint  Patient presents with  . Abnormal ECG    HPI Alyssa Williamson is a 83 y.o. female.  HPI 83 year old female who was sent to ED for abnormal EKG from urgent care. She has a history of hypertension and hyperlipidemia. Has had several days of nonproductive cough, nasal congestion, postnasal drip and mild sinus headache. Was seen at urgent care, where she was told that she had an irregular heartbeat. An EKG subsequently showed new onset atrial fibrillation, and she was sent to ED for evaluation. She states that she otherwise has been feeling like she is in her usual state of health. Denies any fevers, confusion, generalized weakness or fatigue, lower extremity edema, shortness of breath, chest pain, orthopnea or PND. Denies any nausea, vomiting, diarrhea, or any other recent illnesses. Past Medical History:  Diagnosis Date  . Allergic rhinitis   . Anemia   . Anxiety   . Arthritis    bilateral knee  . Cervical cancer screening 10/30/2013   Menarche at 15 Regular and moderate flow No history of abnormal pap in past G4P3, s/p 3 svd and 1 MC No history of abnormal MGM Noconcerns today No gyn surgeries  . Constipation 11/18/2015  . Depression with anxiety   . Diverticulitis   . Heart murmur   . Hyperlipidemia   . Hypertension   . Hypothyroid   . Left knee DJD   . Medicare annual wellness visit, subsequent 10/30/2013  . PONV (postoperative nausea and vomiting)   . Preventative health care 05/20/2016    Patient Active Problem List   Diagnosis Date Noted  . Preventative health care 05/20/2016  . Constipation 11/18/2015  . History of thyroid nodule 07/24/2014  . Allergic rhinitis 07/05/2014  . Atypical chest pain 11/04/2013  . Other malaise and fatigue 10/30/2013  . Medicare annual wellness visit, subsequent 10/30/2013  . Cervical cancer screening  10/30/2013  . Hyperglycemia 05/23/2013  . Hyponatremia 07/28/2012  . Diverticulitis   . Arthritis   . Heart murmur   . Depression with anxiety   . Anemia   . Intertrigo 06/07/2012  . Arthritis of knee 05/24/2012  . Hypothyroidism 06/27/2011  . HTN (hypertension) 06/27/2011  . Hyperlipidemia 06/27/2011  . Recurrent UTI 06/27/2011  . Murmur 06/27/2011    Past Surgical History:  Procedure Laterality Date  . ABDOMINAL HYSTERECTOMY    . bladder tack  04/2010   Dr Marciano Sequin  . GANGLION CYST EXCISION    . JOINT REPLACEMENT Right   . REPLACEMENT TOTAL KNEE  10/2010   right knee-Murphy   . TONSILLECTOMY    . TOTAL KNEE ARTHROPLASTY Left 07/26/2012   Procedure: TOTAL KNEE ARTHROPLASTY;  Surgeon: Loreta Ave, MD;  Location: [facility] OR;  Service: Orthopedics;  Laterality: Left;    OB History    No data available       Home Medications    Prior to Admission medications   Medication Sig Start Date End Date Taking? Authorizing Provider  ALPRAZolam (XANAX) 0.25 MG tablet TAKE 1 TABLET BY MOUTH TWICE DAILY AS NEEDED FOR ANXIETY OR SLEEP 09/28/16   Bradd Canary, MD  apixaban (ELIQUIS) 5 MG TABS tablet Take 1 tablet (5 mg total) by mouth 2 (two) times daily. 11/08/16   Lavera Guise, MD  atorvastatin (LIPITOR) 40 MG tablet TAKE 1 TABLET(40 MG) BY MOUTH  DAILY 06/30/16   Bradd Canary, MD  citalopram (CELEXA) 20 MG tablet TAKE 1 TABLET(20 MG) BY MOUTH DAILY 09/24/16   Bradd Canary, MD  Cranberry 500 MG CAPS Take 500 mg by mouth daily.    [provider]  fexofenadine (ALLEGRA ODT) 30 MG disintegrating tablet Take 1 tablet (30 mg total) by mouth daily. 11/11/14   Bradd Canary, MD  levothyroxine (SYNTHROID, LEVOTHROID) 75 MCG tablet TAKE 1 TABLET(75 MCG) BY MOUTH DAILY 05/27/16   Bradd Canary, MD  Multiple Vitamins-Minerals (CENTRUM SILVER PO) Take 1 tablet by mouth daily.    [provider]  PROVENTIL HFA 108 (90 Base) MCG/ACT inhaler INHALE 2 PUFFS INTO THE LUNGS  EVERY 6 HOURS AS NEEDED FOR WHEEZING OR SHORTNESS OF BREATH 01/29/16   Bradd Canary, MD  verapamil (CALAN-SR) 180 MG CR tablet TAKE 1 TABLET BY MOUTH EVERY EVENING AT BEDTIME 06/30/16   Bradd Canary, MD    Family History Family History  Problem Relation Age of Onset  . Alcohol abuse Father   . Breast cancer Sister   . Cancer Sister        bladder  . Glaucoma Sister   . Arthritis Daughter   . Diabetes Daughter        type 2  . Cancer Daughter        brain cancer, diagnosed 29 years.agp  . Mental illness Daughter        s/p craniotomies, gamma knife for tumors. numerous shunts.  . Heart failure Mother   . Cancer Brother        glioblastoma   . Glaucoma Brother   . Diverticulosis Sister   . Cancer Sister        liver  . Cancer Brother        melanoma, mets to lung and brain and intestine  . Alcohol abuse Brother   . Cancer Brother        liver  . Glaucoma Maternal Grandfather   . Cancer Sister        breast  . Colon cancer Neg Hx   . Hypertension Neg Hx     Social History Social History  Substance Use Topics  . Smoking status: Never Smoker  . Smokeless tobacco: Never Used  . Alcohol use No     Allergies   Codeine   Review of Systems Review of Systems  Constitutional: Negative for fever.  Respiratory: Negative for shortness of breath.   Cardiovascular: Negative for chest pain and leg swelling.  Gastrointestinal: Negative for abdominal pain.  All other systems reviewed and are negative.    Physical Exam Updated Vital Signs BP 138/73 (BP Location: Right Arm)   Pulse 89   Temp 98 F (36.7 C) (Oral)   Resp 20   Ht  (1.575 m)   Wt 80.3 kg (177 lb)   SpO2 98%   BMI 32.37 kg/m   Physical Exam Physical Exam  Nursing note and vitals reviewed. Constitutional: Well developed, well nourished, non-toxic, and in no acute distress Head: Normocephalic and atraumatic.  Mouth/Throat: Oropharynx is clear and moist.  Neck: Normal range of motion. Neck  supple.  Cardiovascular: Normal rate and regular rhythm.   Pulmonary/Chest: Effort normal and breath sounds normal.  Abdominal: Soft. There is no tenderness. There is no rebound and no guarding.  Musculoskeletal: Normal range of motion.  Neurological: Alert, no facial droop, fluent speech, moves all extremities symmetrically Skin: Skin is warm and dry.  Psychiatric: Cooperative   ED Treatments / Results  Labs (all labs ordered are listed, but only abnormal results are displayed) Labs Reviewed  COMPREHENSIVE METABOLIC PANEL - Abnormal; Notable for the following:       Result Value   Creatinine, Ser 1.04 (*)    Calcium 8.8 (*)    GFR calc non Af Amer 48 (*)    GFR calc Af Amer 56 (*)    All other components within normal limits  CBC WITH DIFFERENTIAL/PLATELET  MAGNESIUM  TSH    EKG  EKG Interpretation  Date/Time:  Monday November 08 2016 16:20:08 EDT Ventricular Rate:  100 PR Interval:    QRS Duration: 78 QT Interval:  304 QTC Calculation: 392 R Axis:   36 Text Interpretation:  Atrial fibrillation Low voltage QRS Nonspecific T wave abnormality Abnormal ECG new onset atrial fibrillation Confirmed by Crista Curb [phone]) on 11/08/2016 5:44:26 PM       Radiology Dg Chest 2 View  Result Date: 11/08/2016 CLINICAL DATA:  AFib EXAM: CHEST  2 VIEW COMPARISON:  08/05/2014 FINDINGS: The lungs are clear without focal pneumonia, edema, pneumothorax or pleural effusion. Cardiopericardial silhouette is at upper limits of normal for size. The visualized bony structures of the thorax are intact. Telemetry leads overlie the chest. IMPRESSION: No active cardiopulmonary disease. Electronically Signed   By: Kennith Center M.D.   On: 11/08/2016 17:42    Procedures Procedures (including critical care time)  Medications Ordered in ED Medications - No data to display   Initial Impression / Assessment and Plan / ED Course  I have reviewed the triage vital signs and the nursing  notes.  Pertinent labs & imaging results that were available during my care of the patient were reviewed by me and considered in my medical decision making (see chart for details).    Presents with new onset A. fib. Is rate controlled, and normally takes verapamil for hypertension. She is asymptomatic. Thyroid studies are pending. Blood work shows no major electrolyte or metabolic derangements. Chads 2 vasc of 5. Started on Eliquis 5 mg BID. No clear onset time, and not candidate for cardioversion. But given she is well appearing, asymptomatic, and rate controlled, she is felt appropriate for outpatient management through the atrial fibrillation clinic. Referral is given. Risk benefits of anticoagulation discussed. Strict return and follow-up instructions reviewed. She expressed understanding of all discharge instructions and felt comfortable with the plan of care.   Final Clinical Impressions(s) / ED Diagnoses   Final diagnoses:  New onset atrial fibrillation (HCC)    New Prescriptions New Prescriptions   APIXABAN (ELIQUIS) 5 MG TABS TABLET    Take 1 tablet (5 mg total) by mouth 2 (two) times daily.     Lavera Guise, MD 11/08/16 [phone]

## 2016-11-08 NOTE — Telephone Encounter (Signed)
Patient was seen at an Urgent Care today for a sinus infection. They did an EKG at their office and there results were AFIB. She has walked in the office with EKG results/ DR Abner Greenspan has reviewed results and is advising patient to go to the ER

## 2016-11-08 NOTE — Telephone Encounter (Signed)
Reviewed EKG  Between patients and recommended patient present to ER for further consideraiton

## 2016-11-08 NOTE — ED Notes (Signed)
ED Provider at bedside. 

## 2016-11-08 NOTE — ED Notes (Signed)
ED Provider at bedside discussing test results and dispo plan of care, including use of blood thinner.

## 2016-11-08 NOTE — ED Triage Notes (Signed)
Pt states she was being seen at [facility] for sinus infection-they did an EKG and advised pt she was in Afib-pt NAD-steady gait

## 2016-11-11 ENCOUNTER — Encounter (HOSPITAL_COMMUNITY): Payer: Self-pay | Admitting: Nurse Practitioner

## 2016-11-11 ENCOUNTER — Ambulatory Visit (HOSPITAL_COMMUNITY)
Admission: RE | Admit: 2016-11-11 | Discharge: 2016-11-11 | Disposition: A | Discharge status: Home / Self Care | Payer: Medicare Other | Source: Ambulatory Visit | Attending: Nurse Practitioner | Admitting: Nurse Practitioner

## 2016-11-11 VITALS — BP 122/74 | HR 107 | Ht 62.0 in | Wt 176.2 lb

## 2016-11-11 DIAGNOSIS — E039 Hypothyroidism, unspecified: Secondary | ICD-10-CM | POA: Diagnosis not present

## 2016-11-11 DIAGNOSIS — Z833 Family history of diabetes mellitus: Secondary | ICD-10-CM | POA: Insufficient documentation

## 2016-11-11 DIAGNOSIS — Z803 Family history of malignant neoplasm of breast: Secondary | ICD-10-CM | POA: Insufficient documentation

## 2016-11-11 DIAGNOSIS — Z808 Family history of malignant neoplasm of other organs or systems: Secondary | ICD-10-CM | POA: Insufficient documentation

## 2016-11-11 DIAGNOSIS — Z9889 Other specified postprocedural states: Secondary | ICD-10-CM | POA: Insufficient documentation

## 2016-11-11 DIAGNOSIS — E785 Hyperlipidemia, unspecified: Secondary | ICD-10-CM | POA: Diagnosis not present

## 2016-11-11 DIAGNOSIS — I481 Persistent atrial fibrillation: Secondary | ICD-10-CM | POA: Insufficient documentation

## 2016-11-11 DIAGNOSIS — Z79899 Other long term (current) drug therapy: Secondary | ICD-10-CM | POA: Insufficient documentation

## 2016-11-11 DIAGNOSIS — Z811 Family history of alcohol abuse and dependence: Secondary | ICD-10-CM | POA: Diagnosis not present

## 2016-11-11 DIAGNOSIS — Z90710 Acquired absence of both cervix and uterus: Secondary | ICD-10-CM | POA: Diagnosis not present

## 2016-11-11 DIAGNOSIS — Z8052 Family history of malignant neoplasm of bladder: Secondary | ICD-10-CM | POA: Insufficient documentation

## 2016-11-11 DIAGNOSIS — Z7901 Long term (current) use of anticoagulants: Secondary | ICD-10-CM | POA: Diagnosis not present

## 2016-11-11 DIAGNOSIS — Z96652 Presence of left artificial knee joint: Secondary | ICD-10-CM | POA: Insufficient documentation

## 2016-11-11 DIAGNOSIS — Z8249 Family history of ischemic heart disease and other diseases of the circulatory system: Secondary | ICD-10-CM | POA: Insufficient documentation

## 2016-11-11 DIAGNOSIS — Z885 Allergy status to narcotic agent status: Secondary | ICD-10-CM | POA: Diagnosis not present

## 2016-11-11 DIAGNOSIS — F418 Other specified anxiety disorders: Secondary | ICD-10-CM | POA: Diagnosis not present

## 2016-11-11 DIAGNOSIS — I10 Essential (primary) hypertension: Secondary | ICD-10-CM | POA: Insufficient documentation

## 2016-11-11 DIAGNOSIS — I4819 Other persistent atrial fibrillation: Secondary | ICD-10-CM

## 2016-11-11 MED ORDER — VERAPAMIL HCL ER 240 MG PO TBCR: 240.0000 mg | EXTENDED_RELEASE_TABLET | Freq: Every day | ORAL | 3 refills | Status: DC

## 2016-11-11 NOTE — Patient Instructions (Signed)
Your physician has recommended you make the following change in your medication: 1)increase verapamil to  once a day

## 2016-11-12 ENCOUNTER — Other Ambulatory Visit: Payer: Self-pay

## 2016-11-12 DIAGNOSIS — I10 Essential (primary) hypertension: Secondary | ICD-10-CM

## 2016-11-12 NOTE — Telephone Encounter (Signed)
Requesting: xanax .  Contract: 05/09/14 UDS: 04/12/16  Next due 10/10/16 Last OV: 08/09/16 Next OV: 11/25/16 Last Refill: 09/28/16   Please advise

## 2016-11-12 NOTE — Progress Notes (Signed)
Primary Care Physician: Bradd Canary, MD Referring Physician: [facility] ER f/u   Alyssa Williamson is a 83 y.o. female  sent to ED for abnormal EKG from urgent care. She has a history of hypertension and hyperlipidemia. Has had several days of nonproductive cough, nasal congestion, postnasal drip and mild sinus headache. Was seen at urgent care, where she was told that she had an irregular heartbeat. An EKG subsequently showed new onset atrial fibrillation, and she was sent to ED for evaluation. She states that she otherwise has been feeling like she is in her usual state of health.  She is being seen in afib clinic for f/u.She appears to be asymptomatic. She has already been on verapamil for years. She eas placed on Eliquis 5 mg bid for a chadsvasc score of at least 4.She is not a big coffee drinker, no tobacco, no alcohol.Denies snoring/apnea. She does have a lot of stress being the guardian of an adult daughter that has had previous brain surgery and does not have the best reasoning or  judgement.  Today, she denies symptoms of palpitations, chest pain, shortness of breath, orthopnea, PND, lower extremity edema, dizziness, presyncope, syncope, or neurologic sequela. The patient is tolerating medications without difficulties and is otherwise without complaint today.   Past Medical History:  Diagnosis Date  . Allergic rhinitis   . Anemia   . Anxiety   . Arthritis    bilateral knee  . Cervical cancer screening 10/30/2013   Menarche at 15 Regular and moderate flow No history of abnormal pap in past G4P3, s/p 3 svd and 1 MC No history of abnormal MGM Noconcerns today No gyn surgeries  . Constipation 11/18/2015  . Depression with anxiety   . Diverticulitis   . Heart murmur   . Hyperlipidemia   . Hypertension   . Hypothyroid   . Left knee DJD   . Medicare annual wellness visit, subsequent 10/30/2013  . PONV (postoperative nausea and vomiting)   . Preventative health care 05/20/2016   Past Surgical  History:  Procedure Laterality Date  . ABDOMINAL HYSTERECTOMY    . bladder tack  04/2010   Dr Marciano Sequin  . GANGLION CYST EXCISION    . JOINT REPLACEMENT Right   . REPLACEMENT TOTAL KNEE  10/2010   right knee-Murphy   . TONSILLECTOMY    . TOTAL KNEE ARTHROPLASTY Left 07/26/2012   Procedure: TOTAL KNEE ARTHROPLASTY;  Surgeon: Loreta Ave, MD;  Location: [facility] OR;  Service: Orthopedics;  Laterality: Left;    Current Outpatient Prescriptions  Medication Sig Dispense Refill  . ALPRAZolam (XANAX) 0.25 MG tablet TAKE 1 TABLET BY MOUTH TWICE DAILY AS NEEDED FOR ANXIETY OR SLEEP 60 tablet 0  . apixaban (ELIQUIS) 5 MG TABS tablet Take 1 tablet (5 mg total) by mouth 2 (two) times daily. 60 tablet 0  . atorvastatin (LIPITOR) 40 MG tablet TAKE 1 TABLET(40 MG) BY MOUTH DAILY 30 tablet 5  . citalopram (CELEXA) 20 MG tablet TAKE 1 TABLET(20 MG) BY MOUTH DAILY 30 tablet 0  . Cranberry 500 MG CAPS Take 500 mg by mouth daily.    Marland Kitchen levothyroxine (SYNTHROID, LEVOTHROID) 75 MCG tablet TAKE 1 TABLET(75 MCG) BY MOUTH DAILY 30 tablet 6  . loratadine (CLARITIN) 10 MG tablet Take 10 mg by mouth daily.    . Multiple Vitamins-Minerals (CENTRUM SILVER PO) Take 1 tablet by mouth daily.    Marland Kitchen PROVENTIL HFA 108 (90 Base) MCG/ACT inhaler INHALE 2 PUFFS INTO THE  LUNGS EVERY 6 HOURS AS NEEDED FOR WHEEZING OR SHORTNESS OF BREATH 6.7 g 2  . verapamil (CALAN-SR) 240 MG CR tablet Take 1 tablet (240 mg total) by mouth daily. 30 tablet 3  . benzonatate (TESSALON) 100 MG capsule 1 capsule daily as needed.    . cefdinir (OMNICEF) 300 MG capsule 1 capsule 2 (two) times daily.     Current Facility-Administered Medications  Medication Dose Route Frequency Provider Last Rate Last Dose  . pneumococcal 13-valent conjugate vaccine (PREVNAR 13) injection 0.5 mL  0.5 mL Intramuscular Once Bradd Canary, MD        Allergies  Allergen Reactions  . Codeine Nausea And Vomiting    Social History   Social History  . Marital  status: Widowed    Spouse name: N/A  . Number of children: 3  . Years of education: N/A   Occupational History  . Not on file.   Social History Main Topics  . Smoking status: Never Smoker  . Smokeless tobacco: Never Used  . Alcohol use No  . Drug use: No  . Sexual activity: Not on file   Other Topics Concern  . Not on file   Social History Narrative  . No narrative on file    Family History  Problem Relation Age of Onset  . Alcohol abuse Father   . Breast cancer Sister   . Cancer Sister        bladder  . Glaucoma Sister   . Arthritis Daughter   . Diabetes Daughter        type 2  . Cancer Daughter        brain cancer, diagnosed 29 years.agp  . Mental illness Daughter        s/p craniotomies, gamma knife for tumors. numerous shunts.  . Heart failure Mother   . Cancer Brother        glioblastoma   . Glaucoma Brother   . Diverticulosis Sister   . Cancer Sister        liver  . Cancer Brother        melanoma, mets to lung and brain and intestine  . Alcohol abuse Brother   . Cancer Brother        liver  . Glaucoma Maternal Grandfather   . Cancer Sister        breast  . Colon cancer Neg Hx   . Hypertension Neg Hx     ROS- All systems are reviewed and negative except as per the HPI above  Physical Exam: Vitals:   11/11/16 1533  BP: 122/74  Pulse: (!) 107  Weight: 176 lb 3.2 oz (79.9 kg)  Height:  (1.575 m)   Wt Readings from Last 3 Encounters:  11/11/16 176 lb 3.2 oz (79.9 kg)  11/08/16 177 lb (80.3 kg)  08/09/16 178 lb 8 oz (81 kg)    Labs: Lab Results  Component Value Date   NA 136 11/08/2016   K 4.0 11/08/2016   CL 102 11/08/2016   CO2 26 11/08/2016   GLUCOSE 98 11/08/2016   BUN 18 11/08/2016   CREATININE 1.04 (H) 11/08/2016   CALCIUM 8.8 (L) 11/08/2016   PHOS 3.8 10/25/2013   MG 2.0 11/08/2016   Lab Results  Component Value Date   INR 0.99 07/24/2012   Lab Results  Component Value Date   CHOL 145 05/20/2016   HDL 66.30  05/20/2016   LDLCALC 66 05/20/2016   TRIG 61.0 05/20/2016  GEN- The patient is well appearing, alert and oriented x 3 today.   Head- normocephalic, atraumatic Eyes-  Sclera clear, conjunctiva pink Ears- hearing intact Oropharynx- clear Neck- supple, no JVP Lymph- no cervical lymphadenopathy Lungs- Clear to ausculation bilaterally, normal work of breathing Heart- irregular rate and rhythm, no murmurs, rubs or gallops, PMI not laterally displaced GI- soft, NT, ND, + BS Extremities- no clubbing, cyanosis, or edema MS- no significant deformity or atrophy Skin- no rash or lesion Psych- euthymic mood, full affect Neuro- strength and sensation are intact  EKG-afib at 107 bpm, qrs int 78 ms, qtc 472 ms Epic records reviewed    Assessment and Plan: 1. Persistent afib, asymptomatic General education re afib Triggers discussed Increase verapamil for v rate of 107 from 180 mg a day to  240 mg a day Continue Eliquis 5 mg bid Denies bleeding history Bleeding precautions reviewed Echo  F/u in 10-14 days in afib clinic  Hanalei Glace [facility] [address] [place], [place] [address] [phone]

## 2016-11-14 MED ORDER — ALPRAZOLAM 0.25 MG PO TABS: ORAL_TABLET | ORAL | 0 refills | Status: DC

## 2016-11-15 NOTE — Telephone Encounter (Signed)
rx faxed to pharmacy  PC 

## 2016-11-18 ENCOUNTER — Other Ambulatory Visit: Payer: Self-pay | Admitting: Family Medicine

## 2016-11-19 ENCOUNTER — Ambulatory Visit (HOSPITAL_COMMUNITY)
Admission: RE | Admit: 2016-11-19 | Discharge: 2016-11-19 | Disposition: A | Discharge status: Home / Self Care | Payer: Medicare Other | Source: Ambulatory Visit | Attending: Nurse Practitioner | Admitting: Nurse Practitioner

## 2016-11-19 ENCOUNTER — Ambulatory Visit (HOSPITAL_BASED_OUTPATIENT_CLINIC_OR_DEPARTMENT_OTHER)
Admission: RE | Admit: 2016-11-19 | Discharge: 2016-11-19 | Disposition: A | Discharge status: Home / Self Care | Payer: Medicare Other | Source: Ambulatory Visit | Attending: Nurse Practitioner | Admitting: Nurse Practitioner

## 2016-11-19 ENCOUNTER — Encounter (HOSPITAL_COMMUNITY): Payer: Self-pay | Admitting: Nurse Practitioner

## 2016-11-19 VITALS — BP 122/78 | HR 100 | Ht 62.0 in | Wt 176.6 lb

## 2016-11-19 DIAGNOSIS — Z801 Family history of malignant neoplasm of trachea, bronchus and lung: Secondary | ICD-10-CM | POA: Diagnosis not present

## 2016-11-19 DIAGNOSIS — Z96653 Presence of artificial knee joint, bilateral: Secondary | ICD-10-CM | POA: Diagnosis not present

## 2016-11-19 DIAGNOSIS — Z90710 Acquired absence of both cervix and uterus: Secondary | ICD-10-CM | POA: Diagnosis not present

## 2016-11-19 DIAGNOSIS — I481 Persistent atrial fibrillation: Secondary | ICD-10-CM | POA: Diagnosis not present

## 2016-11-19 DIAGNOSIS — Z808 Family history of malignant neoplasm of other organs or systems: Secondary | ICD-10-CM | POA: Diagnosis not present

## 2016-11-19 DIAGNOSIS — Z800 Family history of malignant neoplasm of digestive organs: Secondary | ICD-10-CM | POA: Diagnosis not present

## 2016-11-19 DIAGNOSIS — Z83511 Family history of glaucoma: Secondary | ICD-10-CM | POA: Diagnosis not present

## 2016-11-19 DIAGNOSIS — Z811 Family history of alcohol abuse and dependence: Secondary | ICD-10-CM | POA: Diagnosis not present

## 2016-11-19 DIAGNOSIS — Z9889 Other specified postprocedural states: Secondary | ICD-10-CM | POA: Insufficient documentation

## 2016-11-19 DIAGNOSIS — Z833 Family history of diabetes mellitus: Secondary | ICD-10-CM | POA: Insufficient documentation

## 2016-11-19 DIAGNOSIS — I4819 Other persistent atrial fibrillation: Secondary | ICD-10-CM

## 2016-11-19 DIAGNOSIS — Z885 Allergy status to narcotic agent status: Secondary | ICD-10-CM | POA: Insufficient documentation

## 2016-11-19 DIAGNOSIS — M1990 Unspecified osteoarthritis, unspecified site: Secondary | ICD-10-CM | POA: Insufficient documentation

## 2016-11-19 DIAGNOSIS — Z818 Family history of other mental and behavioral disorders: Secondary | ICD-10-CM | POA: Diagnosis not present

## 2016-11-19 DIAGNOSIS — Z8052 Family history of malignant neoplasm of bladder: Secondary | ICD-10-CM | POA: Diagnosis not present

## 2016-11-19 DIAGNOSIS — Z809 Family history of malignant neoplasm, unspecified: Secondary | ICD-10-CM | POA: Insufficient documentation

## 2016-11-19 DIAGNOSIS — Z803 Family history of malignant neoplasm of breast: Secondary | ICD-10-CM | POA: Insufficient documentation

## 2016-11-19 DIAGNOSIS — Z8261 Family history of arthritis: Secondary | ICD-10-CM | POA: Insufficient documentation

## 2016-11-19 DIAGNOSIS — I10 Essential (primary) hypertension: Secondary | ICD-10-CM | POA: Insufficient documentation

## 2016-11-19 DIAGNOSIS — E785 Hyperlipidemia, unspecified: Secondary | ICD-10-CM | POA: Diagnosis not present

## 2016-11-19 DIAGNOSIS — I4891 Unspecified atrial fibrillation: Secondary | ICD-10-CM | POA: Insufficient documentation

## 2016-11-19 LAB — ECHOCARDIOGRAM COMPLETE
HEIGHTINCHES: 62 in
Weight: 2825.6 oz

## 2016-11-19 NOTE — Telephone Encounter (Signed)
Pt called in to follow up on refill request. Advised of the turn over time for refill request. Pt says that she will be going out of town and have 4 pills left.

## 2016-11-19 NOTE — Progress Notes (Signed)
  Echocardiogram 2D Echocardiogram has been performed.  Alyssa Williamson 11/19/2016, 11:30 AM

## 2016-11-19 NOTE — Progress Notes (Signed)
Primary Care Physician: Bradd Canary, MD Referring Physician: [facility] ER f/u   Alyssa Williamson is a 83 y.o. female  sent to ED for abnormal EKG from urgent care. She has a history of hypertension and hyperlipidemia. She had several days of nonproductive cough, nasal congestion, postnasal drip and mild sinus headache. Was seen at urgent care, where she was told that she had an irregular heartbeat. An EKG subsequently showed new onset atrial fibrillation, and she was sent to ED for evaluation. She states that she otherwise has been feeling like she is in her usual state of health.  She is being seen in afib clinic for f/u of ER..She appears to be asymptomatic. She has already been on verapamil for years. She was placed on Eliquis 5 mg bid for a chadsvasc score of at least 4.She is not a big coffee drinker, no tobacco, no alcohol.Denies snoring/apnea. She does have a lot of stress being the guardian of an adult daughter that has had previous brain surgery and does not have the best reasoning or  Judgement.  F/u in Afib clinic 9/27. Verapamil was increased on last visit for better rate control. She is at 100 bpm in office, but feels great, asymptomatic, and is planning to go out of state to [place] for a week in a few days. She has an echo here following appointment. Doing well on eliquis 5 mg bid.  Today, she denies symptoms of palpitations, chest pain, shortness of breath, orthopnea, PND, lower extremity edema, dizziness, presyncope, syncope, or neurologic sequela. The patient is tolerating medications without difficulties and is otherwise without complaint today.   Past Medical History:  Diagnosis Date  . Allergic rhinitis   . Anemia   . Anxiety   . Arthritis    bilateral knee  . Cervical cancer screening 10/30/2013   Menarche at 15 Regular and moderate flow No history of abnormal pap in past G4P3, s/p 3 svd and 1 MC No history of abnormal MGM Noconcerns today No gyn surgeries  . Constipation  11/18/2015  . Depression with anxiety   . Diverticulitis   . Heart murmur   . Hyperlipidemia   . Hypertension   . Hypothyroid   . Left knee DJD   . Medicare annual wellness visit, subsequent 10/30/2013  . PONV (postoperative nausea and vomiting)   . Preventative health care 05/20/2016   Past Surgical History:  Procedure Laterality Date  . ABDOMINAL HYSTERECTOMY    . bladder tack  04/2010   Dr Marciano Sequin  . GANGLION CYST EXCISION    . JOINT REPLACEMENT Right   . REPLACEMENT TOTAL KNEE  10/2010   right knee-Murphy   . TONSILLECTOMY    . TOTAL KNEE ARTHROPLASTY Left 07/26/2012   Procedure: TOTAL KNEE ARTHROPLASTY;  Surgeon: Loreta Ave, MD;  Location: [facility] OR;  Service: Orthopedics;  Laterality: Left;    Current Outpatient Prescriptions  Medication Sig Dispense Refill  . ALPRAZolam (XANAX) 0.25 MG tablet TAKE 1 TABLET BY MOUTH TWICE DAILY AS NEEDED FOR ANXIETY OR SLEEP 60 tablet 0  . apixaban (ELIQUIS) 5 MG TABS tablet Take 1 tablet (5 mg total) by mouth 2 (two) times daily. 60 tablet 0  . atorvastatin (LIPITOR) 40 MG tablet TAKE 1 TABLET(40 MG) BY MOUTH DAILY 30 tablet 5  . citalopram (CELEXA) 20 MG tablet TAKE 1 TABLET(20 MG) BY MOUTH DAILY 30 tablet 0  . Cranberry 500 MG CAPS Take 500 mg by mouth daily.    Marland Kitchen  levothyroxine (SYNTHROID, LEVOTHROID) 75 MCG tablet TAKE 1 TABLET(75 MCG) BY MOUTH DAILY 30 tablet 6  . loratadine (CLARITIN) 10 MG tablet Take 10 mg by mouth daily.    . Multiple Vitamins-Minerals (CENTRUM SILVER PO) Take 1 tablet by mouth daily.    Marland Kitchen PROVENTIL HFA 108 (90 Base) MCG/ACT inhaler INHALE 2 PUFFS INTO THE LUNGS EVERY 6 HOURS AS NEEDED FOR WHEEZING OR SHORTNESS OF BREATH 6.7 g 2  . verapamil (CALAN-SR) 240 MG CR tablet Take 1 tablet (240 mg total) by mouth daily. 30 tablet 3   Current Facility-Administered Medications  Medication Dose Route Frequency Provider Last Rate Last Dose  . pneumococcal 13-valent conjugate vaccine (PREVNAR 13) injection 0.5 mL  0.5 mL  Intramuscular Once Bradd Canary, MD        Allergies  Allergen Reactions  . Codeine Nausea And Vomiting    Social History   Social History  . Marital status: Widowed    Spouse name: N/A  . Number of children: 3  . Years of education: N/A   Occupational History  . Not on file.   Social History Main Topics  . Smoking status: Never Smoker  . Smokeless tobacco: Never Used  . Alcohol use No  . Drug use: No  . Sexual activity: Not on file   Other Topics Concern  . Not on file   Social History Narrative  . No narrative on file    Family History  Problem Relation Age of Onset  . Alcohol abuse Father   . Breast cancer Sister   . Cancer Sister        bladder  . Glaucoma Sister   . Arthritis Daughter   . Diabetes Daughter        type 2  . Cancer Daughter        brain cancer, diagnosed 29 years.agp  . Mental illness Daughter        s/p craniotomies, gamma knife for tumors. numerous shunts.  . Heart failure Mother   . Cancer Brother        glioblastoma   . Glaucoma Brother   . Diverticulosis Sister   . Cancer Sister        liver  . Cancer Brother        melanoma, mets to lung and brain and intestine  . Alcohol abuse Brother   . Cancer Brother        liver  . Glaucoma Maternal Grandfather   . Cancer Sister        breast  . Colon cancer Neg Hx   . Hypertension Neg Hx     ROS- All systems are reviewed and negative except as per the HPI above  Physical Exam: Vitals:   11/19/16 1023  BP: 122/78  Pulse: 100  Weight: 176 lb 9.6 oz (80.1 kg)  Height:  (1.575 m)   Wt Readings from Last 3 Encounters:  11/19/16 176 lb 9.6 oz (80.1 kg)  11/11/16 176 lb 3.2 oz (79.9 kg)  11/08/16 177 lb (80.3 kg)    Labs: Lab Results  Component Value Date   NA 136 11/08/2016   K 4.0 11/08/2016   CL 102 11/08/2016   CO2 26 11/08/2016   GLUCOSE 98 11/08/2016   BUN 18 11/08/2016   CREATININE 1.04 (H) 11/08/2016   CALCIUM 8.8 (L) 11/08/2016   PHOS 3.8 10/25/2013     MG 2.0 11/08/2016   Lab Results  Component Value Date   INR 0.99 07/24/2012  Lab Results  Component Value Date   CHOL 145 05/20/2016   HDL 66.30 05/20/2016   LDLCALC 66 05/20/2016   TRIG 61.0 05/20/2016     GEN- The patient is well appearing, alert and oriented x 3 today.   Head- normocephalic, atraumatic Eyes-  Sclera clear, conjunctiva pink Ears- hearing intact Oropharynx- clear Neck- supple, no JVP Lymph- no cervical lymphadenopathy Lungs- Clear to ausculation bilaterally, normal work of breathing Heart- irregular rate and rhythm, no murmurs, rubs or gallops, PMI not laterally displaced GI- soft, NT, ND, + BS Extremities- no clubbing, cyanosis, or edema MS- no significant deformity or atrophy Skin- no rash or lesion Psych- euthymic mood, full affect Neuro- strength and sensation are intact  EKG-afib at 100 bpm, qrs int 78 ms, qtc 433 ms Epic records reviewed   Assessment and Plan: 1. Persistent afib, asymptomatic, new onset Feels great  Continue verapamil at 240 mg a day Continue Eliquis 5 mg bid Denies bleeding history Bleeding precautions reviewed Echo pending today  F/u with Dr. Jens Som in the [facility] office as pt lives in [place] in 3-4 weeks   Elvina Sidle. [facility] [address] [place], [place] [address] [phone]

## 2016-11-24 ENCOUNTER — Telehealth: Payer: Self-pay | Admitting: *Deleted

## 2016-11-24 ENCOUNTER — Other Ambulatory Visit (HOSPITAL_BASED_OUTPATIENT_CLINIC_OR_DEPARTMENT_OTHER): Payer: Medicare Other

## 2016-11-24 NOTE — Telephone Encounter (Signed)
Left message for patient to call and schedule appt with Dr. Jens Som in [facility].

## 2016-11-25 ENCOUNTER — Ambulatory Visit: Payer: Medicare Other | Admitting: Family Medicine

## 2016-11-25 ENCOUNTER — Ambulatory Visit (HOSPITAL_COMMUNITY): Payer: Medicare Other | Admitting: Nurse Practitioner

## 2016-11-25 NOTE — Telephone Encounter (Signed)
Left message for patient to call and schedule appointment with Dr. Jens Som in [facility] office.

## 2016-11-26 ENCOUNTER — Encounter: Payer: Self-pay | Admitting: Family Medicine

## 2016-11-26 NOTE — Telephone Encounter (Signed)
Completed.

## 2016-12-02 ENCOUNTER — Ambulatory Visit: Payer: Medicare Other | Admitting: Family Medicine

## 2016-12-07 ENCOUNTER — Telehealth: Payer: Self-pay | Admitting: Family Medicine

## 2016-12-07 DIAGNOSIS — R739 Hyperglycemia, unspecified: Secondary | ICD-10-CM

## 2016-12-07 DIAGNOSIS — E039 Hypothyroidism, unspecified: Secondary | ICD-10-CM

## 2016-12-07 DIAGNOSIS — E785 Hyperlipidemia, unspecified: Secondary | ICD-10-CM

## 2016-12-07 DIAGNOSIS — I10 Essential (primary) hypertension: Secondary | ICD-10-CM

## 2016-12-07 NOTE — Telephone Encounter (Signed)
Pt says that she has been taking ELIQUIS for a month now. She would like to know if she could come in to have labs completed to see how things are going while she's taking medication.   Please advise.   Pt would like a call back on cell.

## 2016-12-09 NOTE — Telephone Encounter (Signed)
OK to refill Eliquis 30 day supply til seen. OK to order labs hgba1c, cmp, cbc, tsh, lipid for HTN, hi cholesterol and hyperglycemia

## 2016-12-09 NOTE — Telephone Encounter (Signed)
Relation to ZO:XWRUpt:self Call back number:[phone] Pharmacy: [facility] [id number] - [place], [place] - [address]  Reason for call:  Patient requesting  apixaban (ELIQUIS) 5 MG TABS tablet refill due to patient not being able to see her cardiologist until January, please advise

## 2016-12-09 NOTE — Telephone Encounter (Signed)
Please advise on lab work and ill order it

## 2016-12-13 MED ORDER — APIXABAN 5 MG PO TABS: 5.0000 mg | ORAL_TABLET | Freq: Two times a day (BID) | ORAL | 0 refills | Status: DC

## 2016-12-13 NOTE — Telephone Encounter (Signed)
Medication sent to pharmacy  Lab work ordered.   Please call patient and add to Lab schedule.

## 2016-12-13 NOTE — Telephone Encounter (Signed)
Pt called in to follow up. Made pt aware of Rx and also informed her of labs needed. Pt says that she have an apt on 12/21/16, she would like to know if its okay that she complete labs then?   Please advise.

## 2016-12-14 NOTE — Telephone Encounter (Signed)
Her orders for lab work is in she can have the labs done when ever

## 2016-12-21 ENCOUNTER — Ambulatory Visit (INDEPENDENT_AMBULATORY_CARE_PROVIDER_SITE_OTHER): Payer: Medicare Other | Admitting: Family Medicine

## 2016-12-21 ENCOUNTER — Encounter: Payer: Self-pay | Admitting: Family Medicine

## 2016-12-21 VITALS — BP 126/60 | HR 74 | Temp 98.1°F | Resp 18 | Wt 178.0 lb

## 2016-12-21 DIAGNOSIS — Z23 Encounter for immunization: Secondary | ICD-10-CM | POA: Diagnosis not present

## 2016-12-21 DIAGNOSIS — I10 Essential (primary) hypertension: Secondary | ICD-10-CM | POA: Diagnosis not present

## 2016-12-21 DIAGNOSIS — E785 Hyperlipidemia, unspecified: Secondary | ICD-10-CM

## 2016-12-21 DIAGNOSIS — R739 Hyperglycemia, unspecified: Secondary | ICD-10-CM | POA: Diagnosis not present

## 2016-12-21 DIAGNOSIS — E039 Hypothyroidism, unspecified: Secondary | ICD-10-CM | POA: Diagnosis not present

## 2016-12-21 DIAGNOSIS — F418 Other specified anxiety disorders: Secondary | ICD-10-CM

## 2016-12-21 DIAGNOSIS — I4891 Unspecified atrial fibrillation: Secondary | ICD-10-CM

## 2016-12-21 HISTORY — DX: Unspecified atrial fibrillation: I48.91

## 2016-12-21 MED ORDER — ALPRAZOLAM 0.25 MG PO TABS: ORAL_TABLET | ORAL | 3 refills | Status: DC

## 2016-12-21 NOTE — Assessment & Plan Note (Signed)
Rate controlled and tolerating Eliquis  

## 2016-12-21 NOTE — Assessment & Plan Note (Signed)
hgba1c acceptable, minimize simple carbs. Increase exercise as tolerated. Continue current meds 

## 2016-12-21 NOTE — Progress Notes (Signed)
Subjective:  I acted as a Neurosurgeon for Dr. Abner Greenspan. Alyssa Williamson, [place]  Patient ID: Alyssa Williamson, female    DOB: 07-26-1931, 83 y.o.   MRN: [id number]  No chief complaint on file.   HPI  Patient is in today for a 6 month follow up and overall she feels well. No recent febrile illness. She is tolerating the Eliquis she is on. No bloody or tarry stool. Her anxiety is well controlled on current meds. Denies CP/palp/SOB/HA/congestion/fevers/GI or GU c/o. Taking meds as prescribed  Patient Care Team: Bradd Canary, MD as PCP - General (Family Medicine) Su Grand, MD as Consulting Physician (Urology) Norva Riffle (Dermatology) Lewayne Bunting, MD as Consulting Physician (Cardiology)   Past Medical History:  Diagnosis Date  . Allergic rhinitis   . Anemia   . Anxiety   . Arthritis    bilateral knee  . Atrial fibrillation (HCC) 12/21/2016  . Cervical cancer screening 10/30/2013   Menarche at 15 Regular and moderate flow No history of abnormal pap in past G4P3, s/p 3 svd and 1 MC No history of abnormal MGM Noconcerns today No gyn surgeries  . Constipation 11/18/2015  . Depression with anxiety   . Diverticulitis   . Heart murmur   . Hyperlipidemia   . Hypertension   . Hypothyroid   . Left knee DJD   . Medicare annual wellness visit, subsequent 10/30/2013  . PONV (postoperative nausea and vomiting)   . Preventative health care 05/20/2016    Past Surgical History:  Procedure Laterality Date  . ABDOMINAL HYSTERECTOMY    . bladder tack  04/2010   Dr Marciano Sequin  . GANGLION CYST EXCISION    . JOINT REPLACEMENT Right   . REPLACEMENT TOTAL KNEE  10/2010   right knee-Murphy   . TONSILLECTOMY      Family History  Problem Relation Age of Onset  . Alcohol abuse Father   . Breast cancer Sister   . Cancer Sister        bladder  . Glaucoma Sister   . Arthritis Daughter   . Diabetes Daughter        type 2  . Cancer Daughter        brain cancer, diagnosed 29 years.agp    . Mental illness Daughter        s/p craniotomies, gamma knife for tumors. numerous shunts.  . Heart failure Mother   . Cancer Brother        glioblastoma   . Glaucoma Brother   . Diverticulosis Sister   . Cancer Sister        liver  . Cancer Brother        melanoma, mets to lung and brain and intestine  . Alcohol abuse Brother   . Cancer Brother        liver  . Glaucoma Maternal Grandfather   . Cancer Sister        breast  . Colon cancer Neg Hx   . Hypertension Neg Hx     Social History   Socioeconomic History  . Marital status: Widowed    Spouse name: Not on file  . Number of children: 3  . Years of education: Not on file  . Highest education level: Not on file  Social Needs  . Financial resource strain: Not on file  . Food insecurity - worry: Not on file  . Food insecurity - inability: Not on file  . Transportation needs - medical: Not  on file  . Transportation needs - non-medical: Not on file  Occupational History  . Not on file  Tobacco Use  . Smoking status: Never Smoker  . Smokeless tobacco: Never Used  Substance and Sexual Activity  . Alcohol use: No  . Drug use: No  . Sexual activity: Not on file  Other Topics Concern  . Not on file  Social History Narrative  . Not on file    Outpatient Medications Prior to Visit  Medication Sig Dispense Refill  . apixaban (ELIQUIS) 5 MG TABS tablet Take 1 tablet (5 mg total) by mouth 2 (two) times daily. 60 tablet 0  . atorvastatin (LIPITOR) 40 MG tablet TAKE 1 TABLET(40 MG) BY MOUTH DAILY 30 tablet 5  . citalopram (CELEXA) 20 MG tablet TAKE 1 TABLET(20 MG) BY MOUTH DAILY 30 tablet 0  . Cranberry 500 MG CAPS Take 500 mg by mouth daily.    Marland Kitchen levothyroxine (SYNTHROID, LEVOTHROID) 75 MCG tablet TAKE 1 TABLET(75 MCG) BY MOUTH DAILY 30 tablet 6  . loratadine (CLARITIN) 10 MG tablet Take 10 mg by mouth daily.    . Multiple Vitamins-Minerals (CENTRUM SILVER PO) Take 1 tablet by mouth daily.    Marland Kitchen PROVENTIL HFA 108 (90  Base) MCG/ACT inhaler INHALE 2 PUFFS INTO THE LUNGS EVERY 6 HOURS AS NEEDED FOR WHEEZING OR SHORTNESS OF BREATH 6.7 g 2  . verapamil (CALAN-SR) 240 MG CR tablet Take 1 tablet (240 mg total) by mouth daily. 30 tablet 3  . ALPRAZolam (XANAX) 0.25 MG tablet TAKE 1 TABLET BY MOUTH TWICE DAILY AS NEEDED FOR ANXIETY OR SLEEP 60 tablet 0  . pneumococcal 13-valent conjugate vaccine (PREVNAR 13) injection 0.5 mL      No facility-administered medications prior to visit.     Allergies  Allergen Reactions  . Codeine Nausea And Vomiting    Review of Systems  Constitutional: Negative for fever and malaise/fatigue.  HENT: Negative for congestion.   Eyes: Negative for blurred vision.  Respiratory: Negative for shortness of breath.   Cardiovascular: Negative for chest pain, palpitations and leg swelling.  Gastrointestinal: Negative for abdominal pain, blood in stool and nausea.  Genitourinary: Negative for dysuria and frequency.  Musculoskeletal: Negative for falls.  Skin: Negative for rash.  Neurological: Negative for dizziness, loss of consciousness and headaches.  Endo/Heme/Allergies: Negative for environmental allergies.  Psychiatric/Behavioral: Negative for depression. The patient is not nervous/anxious.        Objective:    Physical Exam  Constitutional: She is oriented to person, place, and time. She appears well-developed and well-nourished. No distress.  HENT:  Head: Normocephalic and atraumatic.  Nose: Nose normal.  Eyes: Right eye exhibits no discharge. Left eye exhibits no discharge.  Neck: Normal range of motion. Neck supple.  Cardiovascular: Normal rate and regular rhythm.  No murmur heard. Pulmonary/Chest: Effort normal and breath sounds normal.  Abdominal: Soft. Bowel sounds are normal. There is no tenderness.  Musculoskeletal: She exhibits no edema.  Neurological: She is alert and oriented to person, place, and time.  Skin: Skin is warm and dry.  Psychiatric: She has a  normal mood and affect.  Nursing note and vitals reviewed.   BP 126/60 (BP Location: Left Arm, Patient Position: Sitting, Cuff Size: Small)   Pulse 74   Temp 98.1 F (36.7 C) (Oral)   Resp 18   Wt 178 lb (80.7 kg)   SpO2 94%   BMI 32.56 kg/m  Wt Readings from Last 3 Encounters:  12/21/16 178 lb (  80.7 kg)  11/19/16 176 lb 9.6 oz (80.1 kg)  11/11/16 176 lb 3.2 oz (79.9 kg)   BP Readings from Last 3 Encounters:  12/21/16 126/60  11/19/16 122/78  11/11/16 122/74     Immunization History  Administered Date(s) Administered  . Influenza Whole 11/17/2012  . Influenza, High Dose Seasonal PF 11/18/2015  . Influenza,inj,Quad PF,6+ Mos 10/30/2013  . Influenza-Unspecified 12/24/2014  . Pneumococcal Conjugate-13 10/30/2013  . Pneumococcal Polysaccharide-23 02/10/2015  . Tdap 09/04/2012    Health Maintenance  Topic Date Due  . INFLUENZA VACCINE  09/22/2016  . TETANUS/TDAP  09/05/2022  . DEXA SCAN  Completed  . PNA vac Low Risk Adult  Completed    Lab Results  Component Value Date   WBC 8.3 12/21/2016   HGB 12.1 12/21/2016   HCT 36.2 12/21/2016   PLT 239.0 12/21/2016   GLUCOSE 87 12/21/2016   CHOL 123 12/21/2016   TRIG 75.0 12/21/2016   HDL 53.50 12/21/2016   LDLCALC 55 12/21/2016   ALT 42 (H) 12/21/2016   AST 39 (H) 12/21/2016   NA 139 12/21/2016   K 4.5 12/21/2016   CL 102 12/21/2016   CREATININE 1.43 (H) 12/21/2016   BUN 28 (H) 12/21/2016   CO2 29 12/21/2016   TSH 0.883 11/08/2016   INR 0.99 07/24/2012   HGBA1C 6.0 12/21/2016    Lab Results  Component Value Date   TSH 0.883 11/08/2016   Lab Results  Component Value Date   WBC 8.3 12/21/2016   HGB 12.1 12/21/2016   HCT 36.2 12/21/2016   MCV 96.6 12/21/2016   PLT 239.0 12/21/2016   Lab Results  Component Value Date   NA 139 12/21/2016   K 4.5 12/21/2016   CO2 29 12/21/2016   GLUCOSE 87 12/21/2016   BUN 28 (H) 12/21/2016   CREATININE 1.43 (H) 12/21/2016   BILITOT 0.7 12/21/2016   ALKPHOS 75  12/21/2016   AST 39 (H) 12/21/2016   ALT 42 (H) 12/21/2016   PROT 6.8 12/21/2016   ALBUMIN 3.7 12/21/2016   CALCIUM 9.0 12/21/2016   ANIONGAP 8 11/08/2016   GFR 37.18 (L) 12/21/2016   Lab Results  Component Value Date   CHOL 123 12/21/2016   Lab Results  Component Value Date   HDL 53.50 12/21/2016   Lab Results  Component Value Date   LDLCALC 55 12/21/2016   Lab Results  Component Value Date   TRIG 75.0 12/21/2016   Lab Results  Component Value Date   CHOLHDL 2 12/21/2016   Lab Results  Component Value Date   HGBA1C 6.0 12/21/2016         Assessment & Plan:   Problem List Items Addressed This Visit    Hypothyroidism    On Levothyroxine, continue to monitor. Check free T4 today      Relevant Orders   T4, free (Completed)   HTN (hypertension)    Well controlled, no changes to meds. Encouraged heart healthy diet such as the DASH diet and exercise as tolerated.       Hyperlipidemia    Tolerating statin, encouraged heart healthy diet, avoid trans fats, minimize simple carbs and saturated fats. Increase exercise as tolerated      Relevant Orders   Lipid panel (Completed)   Depression with anxiety   Relevant Medications   ALPRAZolam (XANAX) 0.25 MG tablet   Other Relevant Orders   Pain Mgmt, Profile 5 DL w/Conf, U (Completed)   Hyperglycemia    hgba1c acceptable, minimize simple carbs.  Increase exercise as tolerated. Continue current meds      Relevant Orders   Hemoglobin A1c (Completed)   Atrial fibrillation (HCC)    Rate controlled and tolerating Eliquis      Hypocalcemia    recommend calcium intake of 1200 to 1500 mg daily, divided into roughly 3 doses. Best source is the diet and a single dairy serving is about 500 mg, a supplement of calcium citrate once or twice daily to balance diet is fine if not getting enough in diet. Check Vitamin D level      Relevant Orders   Comprehensive metabolic panel (Completed)   VITAMIN D 25 Hydroxy (Vit-D  Deficiency, Fractures) (Completed)    Other Visit Diagnoses    Needs flu shot    -  Primary   Relevant Orders   Flu vaccine HIGH DOSE PF (Fluzone High dose)   Benign essential HTN       Relevant Medications   ALPRAZolam (XANAX) 0.25 MG tablet   Other Relevant Orders   CBC (Completed)   Comprehensive metabolic panel (Completed)   T4, free (Completed)      I am having Marin Shutter maintain her Multiple Vitamins-Minerals (CENTRUM SILVER PO), Cranberry, PROVENTIL HFA, levothyroxine, atorvastatin, loratadine, verapamil, citalopram, apixaban, and ALPRAZolam. We will stop administering pneumococcal 13-valent conjugate vaccine.  Meds ordered this encounter  Medications  . ALPRAZolam (XANAX) 0.25 MG tablet    Sig: TAKE 1 TABLET BY MOUTH TWICE DAILY AS NEEDED FOR ANXIETY OR SLEEP    Dispense:  60 tablet    Refill:  3    CMA served as scribe during this visit. History, Physical and Plan performed by medical provider. Documentation and orders reviewed and attested to.  Danise Edge, MD

## 2016-12-21 NOTE — Patient Instructions (Signed)

## 2016-12-21 NOTE — Assessment & Plan Note (Signed)
Tolerating statin, encouraged heart healthy diet, avoid trans fats, minimize simple carbs and saturated fats. Increase exercise as tolerated 

## 2016-12-21 NOTE — Assessment & Plan Note (Signed)
Well controlled, no changes to meds. Encouraged heart healthy diet such as the DASH diet and exercise as tolerated.  °

## 2016-12-21 NOTE — Assessment & Plan Note (Signed)
On Levothyroxine, continue to monitor. Check free T4 today

## 2016-12-22 LAB — LIPID PANEL
CHOL/HDL RATIO: 2
Cholesterol: 123 mg/dL (ref 0–200)
HDL: 53.5 mg/dL (ref >39.00)
LDL Cholesterol: 55 mg/dL (ref 0–99)
NONHDL: 69.79
Triglycerides: 75 mg/dL (ref 0.0–149.0)
VLDL: 15 mg/dL (ref 0.0–40.0)

## 2016-12-22 LAB — COMPREHENSIVE METABOLIC PANEL
ALBUMIN: 3.7 g/dL (ref 3.5–5.2)
ALK PHOS: 75 U/L (ref 39–117)
ALT: 42 U/L — ABNORMAL HIGH (ref 0–35)
AST: 39 U/L — AB (ref 0–37)
BUN: 28 mg/dL — ABNORMAL HIGH (ref 6–23)
CHLORIDE: 102 meq/L (ref 96–112)
CO2: 29 meq/L (ref 19–32)
Calcium: 9 mg/dL (ref 8.4–10.5)
Creatinine, Ser: 1.43 mg/dL — ABNORMAL HIGH (ref 0.40–1.20)
GFR: 37.18 mL/min — ABNORMAL LOW (ref >60.00)
Glucose, Bld: 87 mg/dL (ref 70–99)
Potassium: 4.5 mEq/L (ref 3.5–5.1)
SODIUM: 139 meq/L (ref 135–145)
Total Bilirubin: 0.7 mg/dL (ref 0.2–1.2)
Total Protein: 6.8 g/dL (ref 6.0–8.3)

## 2016-12-22 LAB — CBC
HCT: 36.2 % (ref 36.0–46.0)
Hemoglobin: 12.1 g/dL (ref 12.0–15.0)
MCHC: 33.3 g/dL (ref 30.0–36.0)
MCV: 96.6 fl (ref 78.0–100.0)
Platelets: 239 10*3/uL (ref 150.0–400.0)
RBC: 3.75 Mil/uL — AB (ref 3.87–5.11)
RDW: 13.8 % (ref 11.5–15.5)
WBC: 8.3 10*3/uL (ref 4.0–10.5)

## 2016-12-22 LAB — T4, FREE: FREE T4: 1 ng/dL (ref 0.60–1.60)

## 2016-12-22 LAB — VITAMIN D 25 HYDROXY (VIT D DEFICIENCY, FRACTURES): VITD: 35.68 ng/mL (ref 30.00–100.00)

## 2016-12-22 LAB — HEMOGLOBIN A1C: HEMOGLOBIN A1C: 6 % (ref 4.6–6.5)

## 2016-12-24 ENCOUNTER — Telehealth: Payer: Self-pay | Admitting: Family Medicine

## 2016-12-24 LAB — PAIN MGMT, PROFILE 5 DL W/CONF, U
ALPHAHYDROXYALPRAZOLAM: 66 ng/mL — AB (ref <25)
ALPHAHYDROXYMIDAZOLAM: NEGATIVE ng/mL (ref <50)
AMINOCLONAZEPAM: NEGATIVE ng/mL (ref <25)
Alphahydroxytriazolam: NEGATIVE ng/mL (ref <50)
Amphetamines: NEGATIVE ng/mL (ref <500)
BARBITURATES: NEGATIVE ng/mL (ref <300)
BENZODIAZEPINES: POSITIVE ng/mL — AB (ref <100)
CREATININE: 35.9 mg/dL
Cocaine Metabolite: NEGATIVE ng/mL (ref <150)
Hydroxyethylflurazepam: NEGATIVE ng/mL (ref <50)
LORAZEPAM: NEGATIVE ng/mL (ref <50)
METHADONE METABOLITE: NEGATIVE ng/mL (ref <100)
Marijuana Metabolite: NEGATIVE ng/mL (ref <20)
NORDIAZEPAM: NEGATIVE ng/mL (ref <50)
OPIATES: NEGATIVE ng/mL (ref <100)
OXIDANT: NEGATIVE ug/mL (ref <200)
Oxazepam: NEGATIVE ng/mL (ref <50)
Oxycodone: NEGATIVE ng/mL (ref <100)
PH: 6.55 (ref 4.5–9.0)
Temazepam: NEGATIVE ng/mL (ref <50)

## 2016-12-24 NOTE — Telephone Encounter (Signed)
Caller name: Delorise ShinerGrace  Relation to pt: self Call back number:[phone] Pharmacy:  Reason for call: Pt was returning call for lab results. Please advise.

## 2016-12-26 NOTE — Assessment & Plan Note (Signed)
recommend calcium intake of 1200 to 1500 mg daily, divided into roughly 3 doses. Best source is the diet and a single dairy serving is about 500 mg, a supplement of calcium citrate once or twice daily to balance diet is fine if not getting enough in diet. Check Vitamin D level

## 2016-12-26 NOTE — Assessment & Plan Note (Signed)
Doing well on current meds. May continue with current doses. cotnract UTD

## 2016-12-27 DIAGNOSIS — Z23 Encounter for immunization: Secondary | ICD-10-CM | POA: Diagnosis not present

## 2016-12-27 NOTE — Telephone Encounter (Signed)
Completed.

## 2016-12-27 NOTE — Telephone Encounter (Signed)
Pt returned call from last week for lab results. Call pt at [phone].

## 2016-12-28 NOTE — Telephone Encounter (Signed)
Called patient and left detailed message about lab results. I have called her several time and left detailed message.

## 2017-01-04 ENCOUNTER — Other Ambulatory Visit: Payer: Self-pay | Admitting: Family Medicine

## 2017-01-06 ENCOUNTER — Other Ambulatory Visit: Payer: Self-pay | Admitting: Family Medicine

## 2017-01-07 ENCOUNTER — Encounter: Payer: Self-pay | Admitting: Cardiology

## 2017-01-10 ENCOUNTER — Other Ambulatory Visit: Payer: Self-pay | Admitting: Family Medicine

## 2017-01-10 NOTE — Progress Notes (Signed)
HPI: FU atrial fibrillation. Nuclear study May 2014 showed breast attenuation but no ischemia. Ejection fraction 79%. Echocardiogram September 2018 showed normal LV function, mild mitral regurgitation and mild biatrial enlargement. Noted to be in new onset atrial 9/18. Seen in atrial fibrillation clinic; treated with rate control and anticoagulation. TSH normal. Since last seen, the patient denies any dyspnea on exertion, orthopnea, PND, pedal edema, palpitations, syncope or chest pain.   Current Outpatient Medications  Medication Sig Dispense Refill  . ALPRAZolam (XANAX) 0.25 MG tablet TAKE 1 TABLET BY MOUTH TWICE DAILY AS NEEDED FOR ANXIETY OR SLEEP 60 tablet 3  . apixaban (ELIQUIS) 5 MG TABS tablet Take 1 tablet (5 mg total) by mouth 2 (two) times daily. 60 tablet 0  . atorvastatin (LIPITOR) 40 MG tablet TAKE 1 TABLET(40 MG) BY MOUTH DAILY 30 tablet 0  . citalopram (CELEXA) 20 MG tablet TAKE 1 TABLET(20 MG) BY MOUTH DAILY 30 tablet 0  . Cranberry 500 MG CAPS Take 500 mg by mouth daily.    Marland Kitchen. levothyroxine (SYNTHROID, LEVOTHROID) 75 MCG tablet TAKE 1 TABLET(75 MCG) BY MOUTH DAILY 30 tablet 0  . loratadine (CLARITIN) 10 MG tablet Take 10 mg by mouth daily.    . Multiple Vitamins-Minerals (CENTRUM SILVER PO) Take 1 tablet by mouth daily.    Marland Kitchen. PROVENTIL HFA 108 (90 Base) MCG/ACT inhaler INHALE 2 PUFFS INTO THE LUNGS EVERY 6 HOURS AS NEEDED FOR WHEEZING OR SHORTNESS OF BREATH 6.7 g 2  . verapamil (CALAN-SR) 240 MG CR tablet Take 1 tablet (240 mg total) by mouth daily. 30 tablet 3   No current facility-administered medications for this visit.      Past Medical History:  Diagnosis Date  . Allergic rhinitis   . Anemia   . Anxiety   . Arthritis    bilateral knee  . Atrial fibrillation (HCC) 12/21/2016  . Cervical cancer screening 10/30/2013   Menarche at 15 Regular and moderate flow No history of abnormal pap in past G4P3, s/p 3 svd and 1 MC No history of abnormal MGM Noconcerns today  No gyn surgeries  . Constipation 11/18/2015  . Depression with anxiety   . Diverticulitis   . Heart murmur   . Hyperlipidemia   . Hypertension   . Hypothyroid   . Left knee DJD   . Medicare annual wellness visit, subsequent 10/30/2013  . PONV (postoperative nausea and vomiting)   . Preventative health care 05/20/2016    Past Surgical History:  Procedure Laterality Date  . ABDOMINAL HYSTERECTOMY    . bladder tack  04/2010   Dr Marciano Sequinimothy  [facility]  . GANGLION CYST EXCISION    . JOINT REPLACEMENT Right   . REPLACEMENT TOTAL KNEE  10/2010   right knee-Murphy   . TONSILLECTOMY    . TOTAL KNEE ARTHROPLASTY Left 07/26/2012   Procedure: TOTAL KNEE ARTHROPLASTY;  Surgeon: Loreta Aveaniel F [facility], MD;  [facility];  Service: Orthopedics;  Laterality: Left;    Social History   Socioeconomic History  . Marital status: Widowed    Spouse name: Not on file  . Number of children: 3  . Years of education: Not on file  . Highest education level: Not on file  Social Needs  . Financial resource strain: Not on file  . Food insecurity - worry: Not on file  . Food insecurity - inability: Not on file  . Transportation needs - medical: Not on file  . Transportation needs - non-medical: Not on file  Occupational History  . Not on file  Tobacco Use  . Smoking status: Never Smoker  . Smokeless tobacco: Never Used  Substance and Sexual Activity  . Alcohol use: No  . Drug use: No  . Sexual activity: Not on file  Other Topics Concern  . Not on file  Social History Narrative  . Not on file    Family History  Problem Relation Age of Onset  . Alcohol abuse Father   . Breast cancer Sister   . Cancer Sister        bladder  . Glaucoma Sister   . Arthritis Daughter   . Diabetes Daughter        type 2  . Cancer Daughter        brain cancer, diagnosed 29 years.agp  . Mental illness Daughter        s/p craniotomies, gamma knife for tumors. numerous shunts.  . Heart failure Mother   . Cancer Brother          glioblastoma   . Glaucoma Brother   . Diverticulosis Sister   . Cancer Sister        liver  . Cancer Brother        melanoma, mets to lung and brain and intestine  . Alcohol abuse Brother   . Cancer Brother        liver  . Glaucoma Maternal Grandfather   . Cancer Sister        breast  . Colon cancer Neg Hx   . Hypertension Neg Hx     ROS: no fevers or chills, productive cough, hemoptysis, dysphasia, odynophagia, melena, hematochezia, dysuria, hematuria, rash, seizure activity, orthopnea, PND, pedal edema, claudication. Remaining systems are negative.  Physical Exam: Well-developed well-nourished in no acute distress.  Skin is warm and dry.  HEENT is normal.  Neck is supple.  Chest is clear to auscultation with normal expansion.  Cardiovascular exam is RRR Abdominal exam nontender or distended. No masses palpated. Extremities show no edema. neuro grossly intact   A/P  1 persistent atrial fibrillation- patient back in sinus rhythm today. She was asymptomatic with her atrial fibrillation previously. Long discussion today concerning rate control versus rhythm control. We will plan rate control if atrial fibrillation recurs and anticoagulation long-term. Continue verapamil. Continue apixaban (CHADSvasc 4). Recent creatinine1.4. Will recheck in 4 weeks. If greater than 1.5 would need to decrease apixaban to 2.5 BID.  2 hyperlipidemia-continue statin. Lipids and liver monitored by primary care.  3 hypertension-Blood pressure is controlled. Continue present medications.  Olga MillersBrian Avarey Yaeger, MD

## 2017-01-12 ENCOUNTER — Telehealth: Payer: Self-pay | Admitting: Family Medicine

## 2017-01-12 NOTE — Telephone Encounter (Signed)
Copied from CRM [id number]. Topic: Quick Communication - See Telephone Encounter >> Jan 12, 2017 10:26 AM Herby AbrahamJohnson, Shiquita C wrote: CRM for notification. See Telephone encounter for:  01/12/17.    Self.   Refill for Eliquis    Pharmacy: [phone] - [place], [place] - [phone] &

## 2017-01-14 ENCOUNTER — Other Ambulatory Visit: Payer: Self-pay | Admitting: Family Medicine

## 2017-01-17 MED ORDER — APIXABAN 5 MG PO TABS: 5.0000 mg | ORAL_TABLET | Freq: Two times a day (BID) | ORAL | 0 refills | Status: DC

## 2017-01-17 NOTE — Telephone Encounter (Signed)
rx sent in 

## 2017-01-19 ENCOUNTER — Encounter: Payer: Self-pay | Admitting: Cardiology

## 2017-01-19 ENCOUNTER — Ambulatory Visit (INDEPENDENT_AMBULATORY_CARE_PROVIDER_SITE_OTHER): Payer: Medicare Other | Admitting: Cardiology

## 2017-01-19 VITALS — BP 139/81 | HR 71 | Ht 62.0 in | Wt 175.4 lb

## 2017-01-19 DIAGNOSIS — I10 Essential (primary) hypertension: Secondary | ICD-10-CM

## 2017-01-19 DIAGNOSIS — I481 Persistent atrial fibrillation: Secondary | ICD-10-CM

## 2017-01-19 DIAGNOSIS — I4819 Other persistent atrial fibrillation: Secondary | ICD-10-CM

## 2017-01-19 DIAGNOSIS — E7800 Pure hypercholesterolemia, unspecified: Secondary | ICD-10-CM | POA: Diagnosis not present

## 2017-01-19 DIAGNOSIS — I480 Paroxysmal atrial fibrillation: Secondary | ICD-10-CM | POA: Diagnosis not present

## 2017-01-19 NOTE — Patient Instructions (Signed)
Medication Instructions:   NO CHANGE  Labwork:  Your physician recommends that you return for lab work in: 4 WEEKS  Follow-Up:  Your physician wants you to follow-up in: 6 MONTHSWITH DR Jens SomRENSHAW You will receive a reminder letter in the mail two months in advance. If you don't receive a letter, please call our office to schedule the follow-up appointment.   If you need a refill on your cardiac medications before your next appointment, please call your pharmacy.

## 2017-02-01 ENCOUNTER — Other Ambulatory Visit: Payer: Self-pay | Admitting: Family Medicine

## 2017-02-08 ENCOUNTER — Other Ambulatory Visit: Payer: Self-pay | Admitting: Family Medicine

## 2017-02-08 ENCOUNTER — Telehealth: Payer: Self-pay | Admitting: Cardiology

## 2017-02-08 MED ORDER — APIXABAN 5 MG PO TABS: 5.0000 mg | ORAL_TABLET | Freq: Two times a day (BID) | ORAL | 1 refills | Status: DC

## 2017-02-08 NOTE — Telephone Encounter (Signed)
°*  STAT* If patient is at the pharmacy, call can be transferred to refill team.   1. Which medications need to be refilled? (please list name of each medication and dose if known) eliquis 5mg   2. Which pharmacy/location (including street and city if local pharmacy) is medication to be sent to? [facility] on [phone] 3. Do they need a 30 day or 90 day supply?  60

## 2017-02-18 ENCOUNTER — Other Ambulatory Visit (INDEPENDENT_AMBULATORY_CARE_PROVIDER_SITE_OTHER): Payer: Medicare Other

## 2017-02-18 DIAGNOSIS — E785 Hyperlipidemia, unspecified: Secondary | ICD-10-CM

## 2017-02-18 DIAGNOSIS — E039 Hypothyroidism, unspecified: Secondary | ICD-10-CM | POA: Diagnosis not present

## 2017-02-18 DIAGNOSIS — I10 Essential (primary) hypertension: Secondary | ICD-10-CM

## 2017-02-18 DIAGNOSIS — R739 Hyperglycemia, unspecified: Secondary | ICD-10-CM

## 2017-02-18 LAB — LIPID PANEL
CHOL/HDL RATIO: 2
Cholesterol: 130 mg/dL (ref 0–200)
HDL: 60.6 mg/dL (ref >39.00)
LDL Cholesterol: 60 mg/dL (ref 0–99)
NonHDL: 69.27
TRIGLYCERIDES: 46 mg/dL (ref 0.0–149.0)
VLDL: 9.2 mg/dL (ref 0.0–40.0)

## 2017-02-18 LAB — COMPREHENSIVE METABOLIC PANEL
ALK PHOS: 58 U/L (ref 39–117)
ALT: 20 U/L (ref 0–35)
AST: 21 U/L (ref 0–37)
Albumin: 3.6 g/dL (ref 3.5–5.2)
BILIRUBIN TOTAL: 0.9 mg/dL (ref 0.2–1.2)
BUN: 20 mg/dL (ref 6–23)
CALCIUM: 8.5 mg/dL (ref 8.4–10.5)
CO2: 26 meq/L (ref 19–32)
Chloride: 102 mEq/L (ref 96–112)
Creatinine, Ser: 1.26 mg/dL — ABNORMAL HIGH (ref 0.40–1.20)
GFR: 43.01 mL/min — AB (ref >60.00)
Glucose, Bld: 77 mg/dL (ref 70–99)
POTASSIUM: 4.4 meq/L (ref 3.5–5.1)
Sodium: 136 mEq/L (ref 135–145)
Total Protein: 6.6 g/dL (ref 6.0–8.3)

## 2017-02-18 LAB — CBC
HCT: 37.3 % (ref 36.0–46.0)
HEMOGLOBIN: 12.6 g/dL (ref 12.0–15.0)
MCHC: 33.7 g/dL (ref 30.0–36.0)
MCV: 96.7 fl (ref 78.0–100.0)
PLATELETS: 186 10*3/uL (ref 150.0–400.0)
RBC: 3.86 Mil/uL — AB (ref 3.87–5.11)
RDW: 13.9 % (ref 11.5–15.5)
WBC: 5.7 10*3/uL (ref 4.0–10.5)

## 2017-02-18 LAB — TSH: TSH: 0.98 u[IU]/mL (ref 0.35–4.50)

## 2017-02-18 LAB — HEMOGLOBIN A1C: Hgb A1c MFr Bld: 5.6 % (ref 4.6–6.5)

## 2017-03-07 ENCOUNTER — Other Ambulatory Visit: Payer: Self-pay | Admitting: Family Medicine

## 2017-03-16 ENCOUNTER — Ambulatory Visit: Payer: Medicare Other | Admitting: Cardiology

## 2017-03-16 ENCOUNTER — Other Ambulatory Visit (HOSPITAL_COMMUNITY): Payer: Self-pay | Admitting: Nurse Practitioner

## 2017-03-16 NOTE — Telephone Encounter (Signed)
We do not fill for Dr. Jens Somrenshaw.

## 2017-03-29 ENCOUNTER — Ambulatory Visit: Payer: Medicare Other | Admitting: Family Medicine

## 2017-04-04 ENCOUNTER — Encounter: Payer: Self-pay | Admitting: Family Medicine

## 2017-04-04 ENCOUNTER — Ambulatory Visit (INDEPENDENT_AMBULATORY_CARE_PROVIDER_SITE_OTHER): Payer: Medicare Other | Admitting: Family Medicine

## 2017-04-04 DIAGNOSIS — I10 Essential (primary) hypertension: Secondary | ICD-10-CM | POA: Diagnosis not present

## 2017-04-04 DIAGNOSIS — E7800 Pure hypercholesterolemia, unspecified: Secondary | ICD-10-CM

## 2017-04-04 DIAGNOSIS — R0982 Postnasal drip: Secondary | ICD-10-CM | POA: Insufficient documentation

## 2017-04-04 DIAGNOSIS — R739 Hyperglycemia, unspecified: Secondary | ICD-10-CM

## 2017-04-04 NOTE — Patient Instructions (Addendum)
Shingrix is the new shingles shot, 2 shots over 2-6 months Hypertension Hypertension is another name for high blood pressure. High blood pressure forces your heart to work harder to pump blood. This can cause problems over time. There are two numbers in a blood pressure reading. There is a top number (systolic) over a bottom number (diastolic). It is best to have a blood pressure below 120/80. Healthy choices can help lower your blood pressure. You may need medicine to help lower your blood pressure if:  Your blood pressure cannot be lowered with healthy choices.  Your blood pressure is higher than 130/80.  Follow these instructions at home: Eating and drinking  If directed, follow the DASH eating plan. This diet includes: ? Filling half of your plate at each meal with fruits and vegetables. ? Filling one quarter of your plate at each meal with whole grains. Whole grains include whole wheat pasta, brown rice, and whole grain bread. ? Eating or drinking low-fat dairy products, such as skim milk or low-fat yogurt. ? Filling one quarter of your plate at each meal with low-fat (lean) proteins. Low-fat proteins include fish, skinless chicken, eggs, beans, and tofu. ? Avoiding fatty meat, cured and processed meat, or chicken with skin. ? Avoiding premade or processed food.  Eat less than 1,500 mg of salt (sodium) a day.  Limit alcohol use to no more than 1 drink a day for nonpregnant women and 2 drinks a day for men. One drink equals 12 oz of beer, 5 oz of wine, or 1 oz of hard liquor. Lifestyle  Work with your doctor to stay at a healthy weight or to lose weight. Ask your doctor what the best weight is for you.  Get at least 30 minutes of exercise that causes your heart to beat faster (aerobic exercise) most days of the week. This may include walking, swimming, or biking.  Get at least 30 minutes of exercise that strengthens your muscles (resistance exercise) at least 3 days a week. This may  include lifting weights or pilates.  Do not use any products that contain nicotine or tobacco. This includes cigarettes and e-cigarettes. If you need help quitting, ask your doctor.  Check your blood pressure at home as told by your doctor.  Keep all follow-up visits as told by your doctor. This is important. Medicines  Take over-the-counter and prescription medicines only as told by your doctor. Follow directions carefully.  Do not skip doses of blood pressure medicine. The medicine does not work as well if you skip doses. Skipping doses also puts you at risk for problems.  Ask your doctor about side effects or reactions to medicines that you should watch for. Contact a doctor if:  You think you are having a reaction to the medicine you are taking.  You have headaches that keep coming back (recurring).  You feel dizzy.  You have swelling in your ankles.  You have trouble with your vision. Get help right away if:  You get a very bad headache.  You start to feel confused.  You feel weak or numb.  You feel faint.  You get very bad pain in your: ? Chest. ? Belly (abdomen).  You throw up (vomit) more than once.  You have trouble breathing. Summary  Hypertension is another name for high blood pressure.  Making healthy choices can help lower blood pressure. If your blood pressure cannot be controlled with healthy choices, you may need to take medicine. This information is not   intended to replace advice given to you by your health care provider. Make sure you discuss any questions you have with your health care provider. Document Released: 07/28/2007 Document Revised: 01/07/2016 Document Reviewed: 01/07/2016 Elsevier Interactive Patient Education  2018 Elsevier Inc.  

## 2017-04-04 NOTE — Progress Notes (Signed)
Patient ID: Alyssa Williamson, female   DOB: [birth year], 84 y.o.   MRN: [id number]   Subjective:    Patient ID: Alyssa Williamson, female    DOB: [birth year], 84 y.o.   MRN: [id number]  Chief Complaint  Patient presents with  . Hypertension    Pt here for follow up  . Hypothyroidism    Pt here for follow up  . depression and anxiety    Pt here for follow up    HPI Patient is in today for follow up. She feels well today. She had a ad fall a couple of months ago and she hurt her face with 2 black eyes. She is mostly better but she notes more nasal congestion since falling and hurting her nose. No recent febrile illness or hospitalizations. Denies CP/palp/SOB/HA/fevers/GI or GU c/o. Taking meds as prescribed  Past Medical History:  Diagnosis Date  . Allergic rhinitis   . Anemia   . Anxiety   . Arthritis    bilateral knee  . Atrial fibrillation (HCC) 12/21/2016  . Cervical cancer screening 10/30/2013   Menarche at 15 Regular and moderate flow No history of abnormal pap in past G4P3, s/p 3 svd and 1 MC No history of abnormal MGM Noconcerns today No gyn surgeries  . Constipation 11/18/2015  . Depression with anxiety   . Diverticulitis   . Heart murmur   . Hyperlipidemia   . Hypertension   . Hypothyroid   . Left knee DJD   . Medicare annual wellness visit, subsequent 10/30/2013  . PONV (postoperative nausea and vomiting)   . Preventative health care 05/20/2016    Past Surgical History:  Procedure Laterality Date  . ABDOMINAL HYSTERECTOMY    . bladder tack  04/2010   Dr Marciano Sequinimothy  [facility]  . GANGLION CYST EXCISION    . JOINT REPLACEMENT Right   . REPLACEMENT TOTAL KNEE  10/2010   right knee-[facility]   . TONSILLECTOMY    . TOTAL KNEE ARTHROPLASTY Left 07/26/2012   Procedure: TOTAL KNEE ARTHROPLASTY;  Surgeon: Loreta Aveaniel F Murphy, MD;  [facility];  Service: Orthopedics;  Laterality: Left;    Family History  Problem Relation Age of Onset  . Alcohol abuse Father   . Breast cancer Sister   .  Cancer Sister        bladder  . Glaucoma Sister   . Arthritis Daughter   . Diabetes Daughter        type 2  . Cancer Daughter        brain cancer, diagnosed 29 years.agp  . Mental illness Daughter        s/p craniotomies, gamma knife for tumors. numerous shunts.  . Heart failure Mother   . Cancer Brother        glioblastoma   . Glaucoma Brother   . Diverticulosis Sister   . Cancer Sister        liver  . Cancer Brother        melanoma, mets to lung and brain and intestine  . Alcohol abuse Brother   . Cancer Brother        liver  . Glaucoma Maternal Grandfather   . Cancer Sister        breast  . Colon cancer Neg Hx   . Hypertension Neg Hx     Social History   Socioeconomic History  . Marital status: Widowed    Spouse name: Not on file  . Number of children: 3  . Years  of education: Not on file  . Highest education level: Not on file  Social Needs  . Financial resource strain: Not on file  . Food insecurity - worry: Not on file  . Food insecurity - inability: Not on file  . Transportation needs - medical: Not on file  . Transportation needs - non-medical: Not on file  Occupational History  . Not on file  Tobacco Use  . Smoking status: Never Smoker  . Smokeless tobacco: Never Used  Substance and Sexual Activity  . Alcohol use: No  . Drug use: No  . Sexual activity: Not on file  Other Topics Concern  . Not on file  Social History Narrative  . Not on file    Outpatient Medications Prior to Visit  Medication Sig Dispense Refill  . ALPRAZolam (XANAX) 0.25 MG tablet TAKE 1 TABLET BY MOUTH TWICE DAILY AS NEEDED FOR ANXIETY OR SLEEP 60 tablet 3  . apixaban (ELIQUIS) 5 MG TABS tablet Take 1 tablet (5 mg total) by mouth 2 (two) times daily. 180 tablet 1  . atorvastatin (LIPITOR) 40 MG tablet TAKE 1 TABLET(40 MG) BY MOUTH DAILY 30 tablet 0  . citalopram (CELEXA) 20 MG tablet TAKE 1 TABLET(20 MG) BY MOUTH DAILY 30 tablet 0  . Cranberry 500 MG CAPS Take 500 mg by mouth  daily.    Marland Kitchen levothyroxine (SYNTHROID, LEVOTHROID) 75 MCG tablet TAKE 1 TABLET(75 MCG) BY MOUTH DAILY 30 tablet 0  . loratadine (CLARITIN) 10 MG tablet Take 10 mg by mouth daily.    . Multiple Vitamins-Minerals (CENTRUM SILVER PO) Take 1 tablet by mouth daily.    Marland Kitchen PROVENTIL HFA 108 (90 Base) MCG/ACT inhaler INHALE 2 PUFFS INTO THE LUNGS EVERY 6 HOURS AS NEEDED FOR WHEEZING OR SHORTNESS OF BREATH 6.7 g 2  . verapamil (CALAN-SR) 240 MG CR tablet TAKE 1 TABLET(240 MG) BY MOUTH DAILY 30 tablet 0   No facility-administered medications prior to visit.     Allergies  Allergen Reactions  . Codeine Nausea And Vomiting    Review of Systems  Constitutional: Negative for fever and malaise/fatigue.  HENT: Positive for congestion.   Eyes: Negative for blurred vision.  Respiratory: Negative for shortness of breath.   Cardiovascular: Negative for chest pain, palpitations and leg swelling.  Gastrointestinal: Negative for abdominal pain, blood in stool and nausea.  Genitourinary: Negative for dysuria and frequency.  Musculoskeletal: Negative for falls.  Skin: Negative for rash.  Neurological: Negative for dizziness, loss of consciousness and headaches.  Endo/Heme/Allergies: Negative for environmental allergies.  Psychiatric/Behavioral: Negative for depression. The patient is not nervous/anxious.        Objective:    Physical Exam  Constitutional: She is oriented to person, place, and time. She appears well-developed and well-nourished. No distress.  HENT:  Head: Normocephalic and atraumatic.  Nose: Nose normal.  Eyes: Right eye exhibits no discharge. Left eye exhibits no discharge.  Neck: Normal range of motion. Neck supple.  Cardiovascular: Normal rate and regular rhythm.  No murmur heard. Pulmonary/Chest: Effort normal and breath sounds normal.  Abdominal: Soft. Bowel sounds are normal. There is no tenderness.  Musculoskeletal: She exhibits no edema.  Neurological: She is alert and  oriented to person, place, and time.  Skin: Skin is warm and dry.  Psychiatric: She has a normal mood and affect.  Nursing note and vitals reviewed.   BP (!) 130/58 (BP Location: Left Arm, Cuff Size: Large)   Pulse 61   Temp (!) 97.5 F (36.4 C) (  Oral)   Resp 16   Ht 5\' 2"  (1.575 m)   Wt 177 lb 3.2 oz (80.4 kg)   SpO2 98%   BMI 32.41 kg/m  Wt Readings from Last 3 Encounters:  04/04/17 177 lb 3.2 oz (80.4 kg)  01/19/17 175 lb 6.4 oz (79.6 kg)  12/21/16 178 lb (80.7 kg)     Lab Results  Component Value Date   WBC 5.7 02/18/2017   HGB 12.6 02/18/2017   HCT 37.3 02/18/2017   PLT 186.0 02/18/2017   GLUCOSE 77 02/18/2017   CHOL 130 02/18/2017   TRIG 46.0 02/18/2017   HDL 60.60 02/18/2017   LDLCALC 60 02/18/2017   ALT 20 02/18/2017   AST 21 02/18/2017   NA 136 02/18/2017   K 4.4 02/18/2017   CL 102 02/18/2017   CREATININE 1.26 (H) 02/18/2017   BUN 20 02/18/2017   CO2 26 02/18/2017   TSH 0.98 02/18/2017   INR 0.99 07/24/2012   HGBA1C 5.6 02/18/2017    Lab Results  Component Value Date   TSH 0.98 02/18/2017   Lab Results  Component Value Date   WBC 5.7 02/18/2017   HGB 12.6 02/18/2017   HCT 37.3 02/18/2017   MCV 96.7 02/18/2017   PLT 186.0 02/18/2017   Lab Results  Component Value Date   NA 136 02/18/2017   K 4.4 02/18/2017   CO2 26 02/18/2017   GLUCOSE 77 02/18/2017   BUN 20 02/18/2017   CREATININE 1.26 (H) 02/18/2017   BILITOT 0.9 02/18/2017   ALKPHOS 58 02/18/2017   AST 21 02/18/2017   ALT 20 02/18/2017   PROT 6.6 02/18/2017   ALBUMIN 3.6 02/18/2017   CALCIUM 8.5 02/18/2017   ANIONGAP 8 11/08/2016   GFR 43.01 (L) 02/18/2017   Lab Results  Component Value Date   CHOL 130 02/18/2017   Lab Results  Component Value Date   HDL 60.60 02/18/2017   Lab Results  Component Value Date   LDLCALC 60 02/18/2017   Lab Results  Component Value Date   TRIG 46.0 02/18/2017   Lab Results  Component Value Date   CHOLHDL 2 02/18/2017   Lab Results   Component Value Date   HGBA1C 5.6 02/18/2017       Assessment & Plan:   Problem List Items Addressed This Visit    HTN (hypertension)    Well controlled, no changes to meds. Encouraged heart healthy diet such as the DASH diet and exercise as tolerated.       Relevant Orders   CBC   Comprehensive metabolic panel   TSH   Hyperlipidemia    Encouraged heart healthy diet, increase exercise, avoid trans fats, consider a krill oil cap daily      Relevant Orders   Lipid panel   Hyperglycemia    hgba1c acceptable, minimize simple carbs. Increase exercise as tolerated.       Relevant Orders   Hemoglobin A1c   PND (post-nasal drip)    She has had increased nasal congestion and PND since she had a fall last month. She will let us know if symptoms worsen or do not resolve and we will consider referral to ENT         I am having Alyssa Shutter maintain her Multiple Vitamins-Minerals (CENTRUM SILVER PO), Cranberry, PROVENTIL HFA, loratadine, ALPRAZolam, citalopram, apixaban, levothyroxine, atorvastatin, and verapamil.  No orders of the defined types were placed in this encounter.    Danise Edge, MD

## 2017-04-04 NOTE — Assessment & Plan Note (Signed)
She has had increased nasal congestion and PND since she had a fall last month. She will let us know if symptoms worsen or do not resolve and we will consider referral to ENT

## 2017-04-04 NOTE — Assessment & Plan Note (Signed)
Encouraged heart healthy diet, increase exercise, avoid trans fats, consider a krill oil cap daily 

## 2017-04-04 NOTE — Assessment & Plan Note (Signed)
hgba1c acceptable, minimize simple carbs. Increase exercise as tolerated.  

## 2017-04-04 NOTE — Assessment & Plan Note (Signed)
Well controlled, no changes to meds. Encouraged heart healthy diet such as the DASH diet and exercise as tolerated.  °

## 2017-04-07 ENCOUNTER — Other Ambulatory Visit: Payer: Self-pay | Admitting: Family Medicine

## 2017-04-12 ENCOUNTER — Other Ambulatory Visit: Payer: Self-pay

## 2017-04-12 MED ORDER — VERAPAMIL HCL ER 240 MG PO TBCR: 240.0000 mg | EXTENDED_RELEASE_TABLET | Freq: Every day | ORAL | 4 refills | Status: DC

## 2017-04-15 ENCOUNTER — Other Ambulatory Visit: Payer: Self-pay | Admitting: Family Medicine

## 2017-05-09 ENCOUNTER — Other Ambulatory Visit: Payer: Self-pay | Admitting: Family Medicine

## 2017-05-16 ENCOUNTER — Other Ambulatory Visit: Payer: Self-pay | Admitting: Family Medicine

## 2017-05-30 ENCOUNTER — Other Ambulatory Visit: Payer: Self-pay

## 2017-05-30 ENCOUNTER — Telehealth: Payer: Self-pay

## 2017-05-30 DIAGNOSIS — I10 Essential (primary) hypertension: Secondary | ICD-10-CM

## 2017-05-30 MED ORDER — ALPRAZOLAM 0.25 MG PO TABS: ORAL_TABLET | ORAL | 0 refills | Status: DC

## 2017-05-30 NOTE — Telephone Encounter (Signed)
OK to refill 30 without refill but needs a repeat UDS to get more refills after that.

## 2017-05-30 NOTE — Telephone Encounter (Signed)
RequestingPrudy Williamson: [facility] Contract: 12/21/16 UDS:12/21/16 LowRisk Last OV: 04/04/17 Next OV: 10/10/17 Last Refill:12/21/16 #60 [id number]   Please advise

## 2017-05-31 NOTE — Telephone Encounter (Signed)
Called left message for patient to call the office back 

## 2017-05-31 NOTE — Telephone Encounter (Signed)
Called pt no answer left voicemail asked her to call back pt needs uds before anymore refills

## 2017-06-06 ENCOUNTER — Other Ambulatory Visit: Payer: Self-pay | Admitting: Family Medicine

## 2017-06-06 ENCOUNTER — Other Ambulatory Visit: Payer: Self-pay

## 2017-06-06 DIAGNOSIS — F32A Depression, unspecified: Secondary | ICD-10-CM

## 2017-06-06 DIAGNOSIS — Z79899 Other long term (current) drug therapy: Secondary | ICD-10-CM

## 2017-06-06 DIAGNOSIS — F329 Major depressive disorder, single episode, unspecified: Secondary | ICD-10-CM

## 2017-06-06 DIAGNOSIS — F419 Anxiety disorder, unspecified: Secondary | ICD-10-CM

## 2017-06-06 NOTE — Telephone Encounter (Signed)
Pt called to check status of RX. Per Windell MouldingRuth they will phone in RX. Scheduled appt for "labs" for UDS on 06/22/17 with pt.

## 2017-06-13 ENCOUNTER — Other Ambulatory Visit: Payer: Self-pay | Admitting: Family Medicine

## 2017-06-17 ENCOUNTER — Other Ambulatory Visit: Payer: Self-pay | Admitting: Family Medicine

## 2017-06-22 ENCOUNTER — Other Ambulatory Visit: Payer: Medicare Other

## 2017-07-03 ENCOUNTER — Emergency Department (HOSPITAL_BASED_OUTPATIENT_CLINIC_OR_DEPARTMENT_OTHER)
Admission: EM | Admit: 2017-07-03 | Discharge: 2017-07-03 | Disposition: A | Discharge status: Home / Self Care | Payer: Medicare Other | Attending: Emergency Medicine | Admitting: Emergency Medicine

## 2017-07-03 ENCOUNTER — Other Ambulatory Visit: Payer: Self-pay

## 2017-07-03 ENCOUNTER — Encounter (HOSPITAL_BASED_OUTPATIENT_CLINIC_OR_DEPARTMENT_OTHER): Payer: Self-pay | Admitting: Emergency Medicine

## 2017-07-03 ENCOUNTER — Emergency Department (HOSPITAL_BASED_OUTPATIENT_CLINIC_OR_DEPARTMENT_OTHER): Payer: Medicare Other

## 2017-07-03 DIAGNOSIS — E039 Hypothyroidism, unspecified: Secondary | ICD-10-CM | POA: Insufficient documentation

## 2017-07-03 DIAGNOSIS — W19XXXA Unspecified fall, initial encounter: Secondary | ICD-10-CM

## 2017-07-03 DIAGNOSIS — Y939 Activity, unspecified: Secondary | ICD-10-CM | POA: Insufficient documentation

## 2017-07-03 DIAGNOSIS — I10 Essential (primary) hypertension: Secondary | ICD-10-CM | POA: Diagnosis not present

## 2017-07-03 DIAGNOSIS — E785 Hyperlipidemia, unspecified: Secondary | ICD-10-CM | POA: Diagnosis not present

## 2017-07-03 DIAGNOSIS — Y929 Unspecified place or not applicable: Secondary | ICD-10-CM | POA: Diagnosis not present

## 2017-07-03 DIAGNOSIS — S0083XA Contusion of other part of head, initial encounter: Secondary | ICD-10-CM

## 2017-07-03 DIAGNOSIS — Z7901 Long term (current) use of anticoagulants: Secondary | ICD-10-CM | POA: Diagnosis not present

## 2017-07-03 DIAGNOSIS — Z79899 Other long term (current) drug therapy: Secondary | ICD-10-CM | POA: Insufficient documentation

## 2017-07-03 DIAGNOSIS — W010XXA Fall on same level from slipping, tripping and stumbling without subsequent striking against object, initial encounter: Secondary | ICD-10-CM | POA: Insufficient documentation

## 2017-07-03 DIAGNOSIS — S0181XA Laceration without foreign body of other part of head, initial encounter: Secondary | ICD-10-CM

## 2017-07-03 DIAGNOSIS — Y998 Other external cause status: Secondary | ICD-10-CM | POA: Diagnosis not present

## 2017-07-03 DIAGNOSIS — S0990XA Unspecified injury of head, initial encounter: Secondary | ICD-10-CM | POA: Insufficient documentation

## 2017-07-03 DIAGNOSIS — S01111A Laceration without foreign body of right eyelid and periocular area, initial encounter: Secondary | ICD-10-CM | POA: Diagnosis not present

## 2017-07-03 DIAGNOSIS — I4891 Unspecified atrial fibrillation: Secondary | ICD-10-CM | POA: Diagnosis not present

## 2017-07-03 MED ORDER — LIDOCAINE HCL (PF) 1 % IJ SOLN
5.0000 mL | Freq: Once | INTRAMUSCULAR | Status: DC
  Filled 2017-07-03: qty 5

## 2017-07-03 NOTE — Discharge Instructions (Addendum)
Sutures out in 5 to 7 days.  Watch out for mental status changes headaches or other signs of delayed head bleed.

## 2017-07-03 NOTE — ED Triage Notes (Signed)
Patient states that she tripped and fell and hit her right head on the concrete. The patient reports that she is on blood thinners. The patient denies any LOC

## 2017-07-04 ENCOUNTER — Other Ambulatory Visit: Payer: Self-pay | Admitting: Family Medicine

## 2017-07-04 NOTE — ED Provider Notes (Signed)
[facility] EMERGENCY DEPARTMENT Provider Note   CSN: [id number] Arrival date & time: 07/03/17  1817     History   Chief Complaint Chief Complaint  Patient presents with  . Fall    HPI Alyssa Williamson is a 84 y.o. female.  HPI Patient presents after a fall.  States she lost her balance and fell striking her face on cement.  No loss conscious.  She is on Eliquis for atrial fibrillation.  No headache.  No confusion.  Does have bruising to face.  Thinks the cuts came from her glasses.  No extremity pain.  No neck pain. Past Medical History:  Diagnosis Date  . Allergic rhinitis   . Anemia   . Anxiety   . Arthritis    bilateral knee  . Atrial fibrillation (HCC) 12/21/2016  . Cervical cancer screening 10/30/2013   Menarche at 15 Regular and moderate flow No history of abnormal pap in past G4P3, s/p 3 svd and 1 MC No history of abnormal MGM Noconcerns today No gyn surgeries  . Constipation 11/18/2015  . Depression with anxiety   . Diverticulitis   . Heart murmur   . Hyperlipidemia   . Hypertension   . Hypothyroid   . Left knee DJD   . Medicare annual wellness visit, subsequent 10/30/2013  . PONV (postoperative nausea and vomiting)   . Preventative health care 05/20/2016    Patient Active Problem List   Diagnosis Date Noted  . PND (post-nasal drip) 04/04/2017  . Hypocalcemia 12/26/2016  . Atrial fibrillation (HCC) 12/21/2016  . Preventative health care 05/20/2016  . Constipation 11/18/2015  . History of thyroid nodule 07/24/2014  . Allergic rhinitis 07/05/2014  . Atypical chest pain 11/04/2013  . Other malaise and fatigue 10/30/2013  . Medicare annual wellness visit, subsequent 10/30/2013  . Cervical cancer screening 10/30/2013  . Hyperglycemia 05/23/2013  . Hyponatremia 07/28/2012  . Diverticulitis   . Arthritis   . Heart murmur   . Depression with anxiety   . Anemia   . Intertrigo 06/07/2012  . Arthritis of knee 05/24/2012  . Hypothyroidism 06/27/2011  .  HTN (hypertension) 06/27/2011  . Hyperlipidemia 06/27/2011  . Recurrent UTI 06/27/2011  . Murmur 06/27/2011    Past Surgical History:  Procedure Laterality Date  . ABDOMINAL HYSTERECTOMY    . bladder tack  04/2010   Dr Marciano Sequin  . GANGLION CYST EXCISION    . JOINT REPLACEMENT Right   . REPLACEMENT TOTAL KNEE  10/2010   right knee-Murphy   . TONSILLECTOMY    . TOTAL KNEE ARTHROPLASTY Left 07/26/2012   Procedure: TOTAL KNEE ARTHROPLASTY;  Surgeon: Loreta Ave, MD;  Location: [facility] OR;  Service: Orthopedics;  Laterality: Left;     OB History   None      Home Medications    Prior to Admission medications   Medication Sig Start Date End Date Taking? Authorizing Provider  ALPRAZolam (XANAX) 0.25 MG tablet TAKE 1 TABLET BY MOUTH TWICE DAILY AS NEEDED FOR ANXIETY OR SLEEP 05/30/17   Bradd Canary, MD  apixaban (ELIQUIS) 5 MG TABS tablet Take 1 tablet (5 mg total) by mouth 2 (two) times daily. 02/08/17   Lewayne Bunting, MD  atorvastatin (LIPITOR) 40 MG tablet TAKE 1 TABLET(40 MG) BY MOUTH DAILY 06/14/17   Bradd Canary, MD  citalopram (CELEXA) 20 MG tablet TAKE 1 TABLET(20 MG) BY MOUTH DAILY 06/21/17   Bradd Canary, MD  Cranberry 500 MG CAPS Take 500  mg by mouth daily.    [provider]  levothyroxine (SYNTHROID, LEVOTHROID) 75 MCG tablet TAKE 1 TABLET(75 MCG) BY MOUTH DAILY 06/06/17   Bradd Canary, MD  loratadine (CLARITIN) 10 MG tablet Take 10 mg by mouth daily.    [provider]  Multiple Vitamins-Minerals (CENTRUM SILVER PO) Take 1 tablet by mouth daily.    [provider]  PROVENTIL HFA 108 (90 Base) MCG/ACT inhaler INHALE 2 PUFFS INTO THE LUNGS EVERY 6 HOURS AS NEEDED FOR WHEEZING OR SHORTNESS OF BREATH 01/29/16   Bradd Canary, MD  verapamil (CALAN-SR) 240 MG CR tablet Take 1 tablet (240 mg total) by mouth daily. 04/12/17   Lewayne Bunting, MD    Family History Family History  Problem Relation Age of Onset  . Alcohol abuse Father     . Breast cancer Sister   . Cancer Sister        bladder  . Glaucoma Sister   . Arthritis Daughter   . Diabetes Daughter        type 2  . Cancer Daughter        brain cancer, diagnosed 29 years.agp  . Mental illness Daughter        s/p craniotomies, gamma knife for tumors. numerous shunts.  . Heart failure Mother   . Cancer Brother        glioblastoma   . Glaucoma Brother   . Diverticulosis Sister   . Cancer Sister        liver  . Cancer Brother        melanoma, mets to lung and brain and intestine  . Alcohol abuse Brother   . Cancer Brother        liver  . Glaucoma Maternal Grandfather   . Cancer Sister        breast  . Colon cancer Neg Hx   . Hypertension Neg Hx     Social History Social History   Tobacco Use  . Smoking status: Never Smoker  . Smokeless tobacco: Never Used  Substance Use Topics  . Alcohol use: No  . Drug use: No     Allergies   Codeine   Review of Systems Review of Systems  Constitutional: Negative for appetite change.  HENT: Negative for congestion.   Respiratory: Negative for shortness of breath.   Cardiovascular: Negative for chest pain.  Gastrointestinal: Negative for abdominal pain.  Genitourinary: Negative for flank pain.  Musculoskeletal: Negative for back pain.  Skin: Positive for wound.  Neurological: Negative for weakness.  Hematological: Bruises/bleeds easily.  Psychiatric/Behavioral: Negative for confusion.     Physical Exam Updated Vital Signs BP (!) 146/63   Pulse 64   Temp 98.3 F (36.8 C) (Oral)   Resp 18   Ht  (1.549 m)   Wt 78.9 kg (174 lb)   SpO2 100%   BMI 32.88 kg/m   Physical Exam  Constitutional: She appears well-developed.  HENT:  Right periorbital hematoma.  Swelling with 2 smaller lacerations laterally.  First 1 around 1 cm and rather linear second 1 around 1 cm and more curved.  Some bruising in right nose area.  No underlying bony tenderness.  Eye movements intact.  Eyes: Pupils are  equal, round, and reactive to light.  Neck: Neck supple.  Cardiovascular: Normal rate.  Pulmonary/Chest: Effort normal.  Abdominal: There is no tenderness.  Musculoskeletal: She exhibits no edema.  Neurological: She is alert.  Skin: Skin is warm. Capillary refill takes less  than 2 seconds.     ED Treatments / Results  Labs (all labs ordered are listed, but only abnormal results are displayed) Labs Reviewed - No data to display  EKG None  Radiology Ct Head Wo Contrast  Result Date: 07/03/2017 CLINICAL DATA:  84 year old female with acute headache following fall and head injury. Initial encounter. EXAM: CT HEAD WITHOUT CONTRAST TECHNIQUE: Contiguous axial images were obtained from the base of the skull through the vertex without intravenous contrast. COMPARISON:  01/01/2006 MR FINDINGS: Brain: No evidence of acute infarction, hemorrhage, hydrocephalus, extra-axial collection or mass lesion/mass effect. Mild atrophy and chronic small-vessel white matter ischemic changes again noted. Vascular: Atherosclerotic calcifications noted. Skull: No acute abnormality Sinuses/Orbits: No acute abnormality. No postseptal or intraconal abnormality. Other: RIGHT preseptal facial soft tissue swelling/hematoma noted. IMPRESSION: 1. No evidence of acute intracranial abnormality 2. RIGHT preseptal facial soft tissue swelling/hematoma without fracture. 3. Atrophy and chronic small-vessel white matter ischemic changes. Electronically Signed   By: Harmon Pier M.D.   On: 07/03/2017 18:49    Procedures .Marland KitchenLaceration Repair Date/Time: 07/04/2017 12:36 AM Performed by: Benjiman Core, MD Authorized by: Benjiman Core, MD   Consent:    Consent obtained:  Verbal   Consent given by:  Patient   Risks discussed:  Pain, poor cosmetic result and infection   Alternatives discussed:  No treatment, delayed treatment and observation Anesthesia (see MAR for exact dosages):    Anesthesia method:  Local infiltration    Local anesthetic:  Lidocaine 1% w/o epi Laceration details:    Location:  Face   Face location:  R eyebrow   Length (cm):  2 Repair type:    Repair type:  Simple Pre-procedure details:    Preparation:  Patient was prepped and draped in usual sterile fashion Exploration:    Contaminated: no   Treatment:    Amount of cleaning:  Standard   Visualized foreign bodies/material removed: no   Skin repair:    Repair method:  Sutures   Suture size:  5-0   Wound skin closure material used: vicryl rapide.   Suture technique:  Simple interrupted   Number of sutures:  6 Approximation:    Approximation:  Close Post-procedure details:    Dressing:  Sterile dressing   Patient tolerance of procedure:  Tolerated well, no immediate complications   (including critical care time)  Medications Ordered in ED Medications - No data to display   Initial Impression / Assessment and Plan / ED Course  I have reviewed the triage vital signs and the nursing notes.  Pertinent labs & imaging results that were available during my care of the patient were reviewed by me and considered in my medical decision making (see chart for details).     Patient with fall.  Hematoma on anticoagulation.  Head CT reassuring.  Facial lacerations closed.  Discharge home.  Final Clinical Impressions(s) / ED Diagnoses   Final diagnoses:  Fall, initial encounter  Contusion of face, initial encounter  Facial laceration, initial encounter    ED Discharge Orders    None       Benjiman Core, MD 07/04/17 [phone]

## 2017-07-05 ENCOUNTER — Other Ambulatory Visit: Payer: Self-pay

## 2017-07-05 DIAGNOSIS — I10 Essential (primary) hypertension: Secondary | ICD-10-CM

## 2017-07-05 MED ORDER — ALPRAZOLAM 0.25 MG PO TABS: ORAL_TABLET | ORAL | 0 refills | Status: DC

## 2017-07-05 NOTE — Telephone Encounter (Signed)
Requesting:xanax Contract:yes UDS:low risk next screen 12/21/17 Last OV:04/04/17 Next OV:10/10/17 Last Refill:05/30/17  #[id number] Database:   Please advise

## 2017-07-07 ENCOUNTER — Encounter: Payer: Self-pay | Admitting: Family Medicine

## 2017-07-07 ENCOUNTER — Ambulatory Visit (INDEPENDENT_AMBULATORY_CARE_PROVIDER_SITE_OTHER): Payer: Medicare Other | Admitting: Family Medicine

## 2017-07-07 DIAGNOSIS — I4891 Unspecified atrial fibrillation: Secondary | ICD-10-CM

## 2017-07-07 DIAGNOSIS — I10 Essential (primary) hypertension: Secondary | ICD-10-CM | POA: Diagnosis not present

## 2017-07-07 DIAGNOSIS — W19XXXD Unspecified fall, subsequent encounter: Secondary | ICD-10-CM | POA: Diagnosis not present

## 2017-07-07 DIAGNOSIS — F419 Anxiety disorder, unspecified: Secondary | ICD-10-CM

## 2017-07-07 DIAGNOSIS — E039 Hypothyroidism, unspecified: Secondary | ICD-10-CM

## 2017-07-07 NOTE — Progress Notes (Signed)
Subjective:  I acted as a Neurosurgeon for Dr. Abner Greenspan. [name], [place]  Patient ID: Alyssa Williamson, female    DOB: 1931/06/03, 84 y.o.   MRN: [id number]  No chief complaint on file.   HPI  Patient is in today for a hospital follow up. Patient fell on 07/03/17 she injured her right eye. She fell and struck the right side of her face causing significant bruising, swelling, pain. It is feeling much better but there is still a significant amount of swelling. No visual changes or pain. No neurologic concerns. Denies CP/palp/SOB/HA/congestion/fevers/GI or GU c/o. Taking meds as prescribed  Patient Care Team: Bradd Canary, MD as PCP - General (Family Medicine) Su Grand, MD as Consulting Physician (Urology) Norva Riffle (Dermatology) Lewayne Bunting, MD as Consulting Physician (Cardiology)   Past Medical History:  Diagnosis Date  . Allergic rhinitis   . Anemia   . Anxiety   . Arthritis    bilateral knee  . Atrial fibrillation (HCC) 12/21/2016  . Cervical cancer screening 10/30/2013   Menarche at 15 Regular and moderate flow No history of abnormal pap in past G4P3, s/p 3 svd and 1 MC No history of abnormal MGM Noconcerns today No gyn surgeries  . Constipation 11/18/2015  . Depression with anxiety   . Diverticulitis   . Heart murmur   . Hyperlipidemia   . Hypertension   . Hypothyroid   . Left knee DJD   . Medicare annual wellness visit, subsequent 10/30/2013  . PONV (postoperative nausea and vomiting)   . Preventative health care 05/20/2016    Past Surgical History:  Procedure Laterality Date  . ABDOMINAL HYSTERECTOMY    . bladder tack  04/2010   Dr Marciano Sequin  . GANGLION CYST EXCISION    . JOINT REPLACEMENT Right   . REPLACEMENT TOTAL KNEE  10/2010   right knee-Murphy   . TONSILLECTOMY    . TOTAL KNEE ARTHROPLASTY Left 07/26/2012   Procedure: TOTAL KNEE ARTHROPLASTY;  Surgeon: Loreta Ave, MD;  Location: [facility] OR;  Service: Orthopedics;  Laterality: Left;     Family History  Problem Relation Age of Onset  . Alcohol abuse Father   . Breast cancer Sister   . Cancer Sister        bladder  . Glaucoma Sister   . Arthritis Daughter   . Diabetes Daughter        type 2  . Cancer Daughter        brain cancer, diagnosed 29 years.agp  . Mental illness Daughter        s/p craniotomies, gamma knife for tumors. numerous shunts.  . Heart failure Mother   . Cancer Brother        glioblastoma   . Glaucoma Brother   . Diverticulosis Sister   . Cancer Sister        liver  . Cancer Brother        melanoma, mets to lung and brain and intestine  . Alcohol abuse Brother   . Cancer Brother        liver  . Glaucoma Maternal Grandfather   . Cancer Sister        breast  . Colon cancer Neg Hx   . Hypertension Neg Hx     Social History   Socioeconomic History  . Marital status: Widowed    Spouse name: Not on file  . Number of children: 3  . Years of education: Not on file  .  Highest education level: Not on file  Occupational History  . Not on file  Social Needs  . Financial resource strain: Not on file  . Food insecurity:    Worry: Not on file    Inability: Not on file  . Transportation needs:    Medical: Not on file    Non-medical: Not on file  Tobacco Use  . Smoking status: Never Smoker  . Smokeless tobacco: Never Used  Substance and Sexual Activity  . Alcohol use: No  . Drug use: No  . Sexual activity: Not on file  Lifestyle  . Physical activity:    Days per week: Not on file    Minutes per session: Not on file  . Stress: Not on file  Relationships  . Social connections:    Talks on phone: Not on file    Gets together: Not on file    Attends religious service: Not on file    Active member of club or organization: Not on file    Attends meetings of clubs or organizations: Not on file    Relationship status: Not on file  . Intimate partner violence:    Fear of current or ex partner: Not on file    Emotionally abused: Not  on file    Physically abused: Not on file    Forced sexual activity: Not on file  Other Topics Concern  . Not on file  Social History Narrative  . Not on file    Outpatient Medications Prior to Visit  Medication Sig Dispense Refill  . ALPRAZolam (XANAX) 0.25 MG tablet TAKE 1 TABLET BY MOUTH TWICE DAILY AS NEEDED FOR ANXIETY OR SLEEP 30 tablet 0  . apixaban (ELIQUIS) 5 MG TABS tablet Take 1 tablet (5 mg total) by mouth 2 (two) times daily. 180 tablet 1  . atorvastatin (LIPITOR) 40 MG tablet TAKE 1 TABLET(40 MG) BY MOUTH DAILY 30 tablet 0  . Cranberry 500 MG CAPS Take 500 mg by mouth daily.    Marland Kitchen levothyroxine (SYNTHROID, LEVOTHROID) 75 MCG tablet TAKE 1 TABLET(75 MCG) BY MOUTH DAILY 30 tablet 0  . loratadine (CLARITIN) 10 MG tablet Take 10 mg by mouth daily.    . Multiple Vitamins-Minerals (CENTRUM SILVER PO) Take 1 tablet by mouth daily.    Marland Kitchen PROVENTIL HFA 108 (90 Base) MCG/ACT inhaler INHALE 2 PUFFS INTO THE LUNGS EVERY 6 HOURS AS NEEDED FOR WHEEZING OR SHORTNESS OF BREATH 6.7 g 2  . verapamil (CALAN-SR) 240 MG CR tablet Take 1 tablet (240 mg total) by mouth daily. 30 tablet 4  . citalopram (CELEXA) 20 MG tablet TAKE 1 TABLET(20 MG) BY MOUTH DAILY 90 tablet 1   No facility-administered medications prior to visit.     Allergies  Allergen Reactions  . Codeine Nausea And Vomiting    Review of Systems  Constitutional: Negative for fever and malaise/fatigue.  HENT: Negative for congestion.   Eyes: Negative for blurred vision.  Respiratory: Negative for shortness of breath.   Cardiovascular: Negative for chest pain, palpitations and leg swelling.  Gastrointestinal: Negative for abdominal pain, blood in stool and nausea.  Genitourinary: Negative for dysuria and frequency.  Musculoskeletal: Negative for falls.  Skin: Negative for rash.  Neurological: Negative for dizziness, loss of consciousness and headaches.  Endo/Heme/Allergies: Negative for environmental allergies.   Psychiatric/Behavioral: Negative for depression. The patient is not nervous/anxious.        Objective:    Physical Exam  Constitutional: She is oriented to person, place, and time.  No distress.  HENT:  Head: Normocephalic and atraumatic.  Bruising around right eye. Swelling noted. Stiches in place. Significant edema noted around eye.   Eyes: Conjunctivae are normal.  Neck: Neck supple. No thyromegaly present.  Cardiovascular: Normal rate and normal heart sounds.  No murmur heard. Pulmonary/Chest: Effort normal and breath sounds normal. She has no wheezes.  Abdominal: She exhibits no distension and no mass.  Musculoskeletal: She exhibits no edema.  Lymphadenopathy:    She has no cervical adenopathy.  Neurological: She is alert and oriented to person, place, and time.  Skin: Skin is warm and dry. No rash noted. She is not diaphoretic.  Psychiatric: Judgment normal.    BP (!) 140/58 (BP Location: Left Arm, Patient Position: Sitting, Cuff Size: Large)   Pulse (!) 56   Temp 98 F (36.7 C) (Oral)   Resp 18   Wt 177 lb 6.4 oz (80.5 kg)   SpO2 98%   BMI 33.52 kg/m  Wt Readings from Last 3 Encounters:  07/07/17 177 lb 6.4 oz (80.5 kg)  07/03/17 174 lb (78.9 kg)  04/04/17 177 lb 3.2 oz (80.4 kg)   BP Readings from Last 3 Encounters:  07/07/17 (!) 140/58  07/03/17 (!) 146/63  04/04/17 (!) 130/58     Immunization History  Administered Date(s) Administered  . Influenza Whole 11/17/2012  . Influenza, High Dose Seasonal PF 11/18/2015, 12/27/2016  . Influenza,inj,Quad PF,6+ Mos 10/30/2013  . Influenza-Unspecified 12/24/2014  . Pneumococcal Conjugate-13 10/30/2013  . Pneumococcal Polysaccharide-23 02/10/2015  . Tdap 09/04/2012    Health Maintenance  Topic Date Due  . INFLUENZA VACCINE  09/22/2017  . TETANUS/TDAP  09/05/2022  . DEXA SCAN  Completed  . PNA vac Low Risk Adult  Completed    Lab Results  Component Value Date   WBC 5.7 02/18/2017   HGB 12.6 02/18/2017    HCT 37.3 02/18/2017   PLT 186.0 02/18/2017   GLUCOSE 77 02/18/2017   CHOL 130 02/18/2017   TRIG 46.0 02/18/2017   HDL 60.60 02/18/2017   LDLCALC 60 02/18/2017   ALT 20 02/18/2017   AST 21 02/18/2017   NA 136 02/18/2017   K 4.4 02/18/2017   CL 102 02/18/2017   CREATININE 1.26 (H) 02/18/2017   BUN 20 02/18/2017   CO2 26 02/18/2017   TSH 0.98 02/18/2017   INR 0.99 07/24/2012   HGBA1C 5.6 02/18/2017    Lab Results  Component Value Date   TSH 0.98 02/18/2017   Lab Results  Component Value Date   WBC 5.7 02/18/2017   HGB 12.6 02/18/2017   HCT 37.3 02/18/2017   MCV 96.7 02/18/2017   PLT 186.0 02/18/2017   Lab Results  Component Value Date   NA 136 02/18/2017   K 4.4 02/18/2017   CO2 26 02/18/2017   GLUCOSE 77 02/18/2017   BUN 20 02/18/2017   CREATININE 1.26 (H) 02/18/2017   BILITOT 0.9 02/18/2017   ALKPHOS 58 02/18/2017   AST 21 02/18/2017   ALT 20 02/18/2017   PROT 6.6 02/18/2017   ALBUMIN 3.6 02/18/2017   CALCIUM 8.5 02/18/2017   ANIONGAP 8 11/08/2016   GFR 43.01 (L) 02/18/2017   Lab Results  Component Value Date   CHOL 130 02/18/2017   Lab Results  Component Value Date   HDL 60.60 02/18/2017   Lab Results  Component Value Date   LDLCALC 60 02/18/2017   Lab Results  Component Value Date   TRIG 46.0 02/18/2017   Lab Results  Component Value  Date   CHOLHDL 2 02/18/2017   Lab Results  Component Value Date   HGBA1C 5.6 02/18/2017         Assessment & Plan:   Problem List Items Addressed This Visit    Hypothyroidism    On Levothyroxine, continue to monitor      HTN (hypertension)    Well controlled, no changes to meds. Encouraged heart healthy diet such as the DASH diet and exercise as tolerated.       Anxiety    Uses Alprazolam infrequently with good results. No concerning side effects, uds and contract UTD      Atrial fibrillation (HCC)    Rate controlled and tolerating Eliquis      Fall    Patient was recently hospitalized  after a fall but she is feeling much better now and denies any concerning headaches or neurologic concerns. She received several stitches but due to the swelling we will wait a few more days to remove the stitches.          I have discontinued Kaylyn Layer. Dittmer's citalopram. I am also having her maintain her Multiple Vitamins-Minerals (CENTRUM SILVER PO), Cranberry, PROVENTIL HFA, loratadine, apixaban, verapamil, atorvastatin, levothyroxine, and ALPRAZolam.  No orders of the defined types were placed in this encounter.   CMA served as Neurosurgeon during this visit. History, Physical and Plan performed by medical provider. Documentation and orders reviewed and attested to.  Danise Edge, MD

## 2017-07-07 NOTE — Patient Instructions (Signed)
Sutured Wound Care Sutures are stitches that can be used to close wounds. Taking care of your wound properly can help prevent pain and infection. It can also help your wound to heal more quickly. How is this treated? Wound Care  Keep the wound clean and dry.  If you were given a bandage (dressing), change it at least one time per day or as told by your doctor. You should also change it if it gets wet or dirty.  Keep the wound completely dry for the first 24 hours or as told by your doctor. After that time, you may shower or bathe. However, make sure that the wound is not soaked in water until the sutures have been removed.  Clean the wound one time each day or as told by your doctor. ? Wash the wound with soap and water. ? Rinse the wound with water to remove all soap. ? Pat the wound dry with a clean towel. Do not rub the wound.  After cleaning the wound, put a thin layer of antibiotic ointment on it as told your doctor. This ointment: ? Helps to prevent infection. ? Keeps the bandage from sticking to the wound.  Have the sutures removed as told by your doctor. General Instructions  Take or apply medicines only as told by your doctor.  To help prevent scarring, make sure to cover your wound with sunscreen whenever you are outside after the sutures are removed and the wound is healed. Make sure to wear a sunscreen of at least 30 SPF.  If you were prescribed an antibiotic medicine or ointment, finish all of it even if you start to feel better.  Do not scratch or pick at the wound.  Keep all follow-up visits as told by your doctor. This is important.  Check your wound every day for signs of infection. Watch for: ? Redness, swelling, or pain. ? Fluid, blood, or pus.  Raise (elevate) the injured area above the level of your heart while you are sitting or lying down, if possible.  Avoid stretching your wound.  Drink enough fluids to keep your pee (urine) clear or pale  yellow. Contact a doctor if:  You were given a tetanus shot and you have any of these where the needle went in: ? Swelling. ? Very bad pain. ? Redness. ? Bleeding.  You have a fever.  A wound that was closed breaks open.  You notice a bad smell coming from the wound.  You notice something coming out of the wound, such as wood or glass.  Medicine does not help your pain.  You have any of these at the site of the wound. ? More redness. ? More swelling. ? More pain.  You have any of these coming from the wound. ? Fluid. ? Blood. ? Pus.  You notice a change in the color of your skin near the wound.  You need to change the bandage often due to fluid, blood, or pus coming from the wound.  You have a new rash.  You have numbness around the wound. Get help right away if:  You have very bad swelling around the wound.  Your pain suddenly gets worse and is very bad.  You have painful lumps near the wound or on skin that is anywhere on your body.  You have a red streak going away from the wound.  The wound is on your hand or foot and you cannot move a finger or toe like normal.  The   wound is on your hand or foot and you notice that your fingers or toes look pale or bluish. This information is not intended to replace advice given to you by your health care provider. Make sure you discuss any questions you have with your health care provider. Document Released: 07/28/2007 Document Revised: 07/17/2015 Document Reviewed: 09/20/2012 Elsevier Interactive Patient Education  2017 [employer]  

## 2017-07-10 DIAGNOSIS — W19XXXA Unspecified fall, initial encounter: Secondary | ICD-10-CM | POA: Insufficient documentation

## 2017-07-10 NOTE — Assessment & Plan Note (Signed)
Well controlled, no changes to meds. Encouraged heart healthy diet such as the DASH diet and exercise as tolerated.  °

## 2017-07-10 NOTE — Assessment & Plan Note (Addendum)
Patient was recently hospitalized after a fall but she is feeling much better now and denies any concerning headaches or neurologic concerns. She received several stitches but due to the swelling we will wait a few more days to remove the stitches.

## 2017-07-10 NOTE — Assessment & Plan Note (Signed)
Uses Alprazolam infrequently with good results. No concerning side effects, uds and contract UTD

## 2017-07-10 NOTE — Assessment & Plan Note (Signed)
Rate controlled and tolerating Eliquis  

## 2017-07-10 NOTE — Assessment & Plan Note (Signed)
On Levothyroxine, continue to monitor 

## 2017-07-11 ENCOUNTER — Ambulatory Visit (INDEPENDENT_AMBULATORY_CARE_PROVIDER_SITE_OTHER): Payer: Medicare Other | Admitting: Family Medicine

## 2017-07-11 ENCOUNTER — Other Ambulatory Visit: Payer: Self-pay | Admitting: Family Medicine

## 2017-07-11 DIAGNOSIS — R739 Hyperglycemia, unspecified: Secondary | ICD-10-CM | POA: Diagnosis not present

## 2017-07-11 DIAGNOSIS — I10 Essential (primary) hypertension: Secondary | ICD-10-CM | POA: Diagnosis not present

## 2017-07-11 DIAGNOSIS — Z4802 Encounter for removal of sutures: Secondary | ICD-10-CM | POA: Diagnosis not present

## 2017-07-11 NOTE — Patient Instructions (Signed)
Suture Removal, Care After Refer to this sheet in the next few weeks. These instructions provide you with information on caring for yourself after your procedure. Your health care provider may also give you more specific instructions. Your treatment has been planned according to current medical practices, but problems sometimes occur. Call your health care provider if you have any problems or questions after your procedure. What can I expect after the procedure? After your stitches (sutures) are removed, it is typical to have the following:  Some discomfort and swelling in the wound area.  Slight redness in the area.  Follow these instructions at home:  If you have skin adhesive strips over the wound area, do not take the strips off. They will fall off on their own in a few days. If the strips remain in place after 14 days, you may remove them.  Change any bandages (dressings) at least once a day or as directed by your health care provider. If the bandage sticks, soak it off with warm, soapy water.  Apply cream or ointment only as directed by your health care provider. If using cream or ointment, wash the area with soap and water 2 times a day to remove all the cream or ointment. Rinse off the soap and pat the area dry with a clean towel.  Keep the wound area dry and clean. If the bandage becomes wet or dirty, or if it develops a bad smell, change it as soon as possible.  Continue to protect the wound from injury.  Use sunscreen when out in the sun. New scars become sunburned easily. Contact a health care provider if:  You have increasing redness, swelling, or pain in the wound.  You see pus coming from the wound.  You have a fever.  You notice a bad smell coming from the wound or dressing.  Your wound breaks open (edges not staying together). This information is not intended to replace advice given to you by your health care provider. Make sure you discuss any questions you have  with your health care provider. Document Released: 11/03/2000 Document Revised: 07/17/2015 Document Reviewed: 09/20/2012 Elsevier Interactive Patient Education  2017 Elsevier Inc.  

## 2017-07-11 NOTE — Assessment & Plan Note (Signed)
hgba1c acceptable, minimize simple carbs. Increase exercise as tolerated.  

## 2017-07-11 NOTE — Assessment & Plan Note (Signed)
Well controlled, no changes to meds. Encouraged heart healthy diet such as the DASH diet and exercise as tolerated.  °

## 2017-07-11 NOTE — Assessment & Plan Note (Signed)
5 sutures removed from above right eyebrow. Cleansed with H2O2 and removed without difficulty. Covered with antibiotic ointment and bandaid. No bleeding or concerns

## 2017-07-11 NOTE — Progress Notes (Signed)
Patient ID: Alyssa Williamson, female   DOB: 1932-02-17, 84 y.o.   MRN: [id number]   Subjective:    Patient ID: Alyssa Williamson, female    DOB: [birth year], 84 y.o.   MRN: [id number]  No chief complaint on file.   HPI Patient is in today for follow up on her recent fall and suture removal. She denies any headache, visual changes or neurologic concerns. No further falls. Denies CP/palp/SOB/HA/congestion/fevers/GI or GU c/o. Taking meds as prescribed  Past Medical History:  Diagnosis Date  . Allergic rhinitis   . Anemia   . Anxiety   . Arthritis    bilateral knee  . Atrial fibrillation (HCC) 12/21/2016  . Cervical cancer screening 10/30/2013   Menarche at 15 Regular and moderate flow No history of abnormal pap in past G4P3, s/p 3 svd and 1 MC No history of abnormal MGM Noconcerns today No gyn surgeries  . Constipation 11/18/2015  . Depression with anxiety   . Diverticulitis   . Heart murmur   . Hyperlipidemia   . Hypertension   . Hypothyroid   . Left knee DJD   . Medicare annual wellness visit, subsequent 10/30/2013  . PONV (postoperative nausea and vomiting)   . Preventative health care 05/20/2016    Past Surgical History:  Procedure Laterality Date  . ABDOMINAL HYSTERECTOMY    . bladder tack  04/2010   Dr Marciano Sequin  . GANGLION CYST EXCISION    . JOINT REPLACEMENT Right   . REPLACEMENT TOTAL KNEE  10/2010   right knee-Murphy   . TONSILLECTOMY    . TOTAL KNEE ARTHROPLASTY Left 07/26/2012   Procedure: TOTAL KNEE ARTHROPLASTY;  Surgeon: Loreta Ave, MD;  Location: [facility] OR;  Service: Orthopedics;  Laterality: Left;    Family History  Problem Relation Age of Onset  . Alcohol abuse Father   . Breast cancer Sister   . Cancer Sister        bladder  . Glaucoma Sister   . Arthritis Daughter   . Diabetes Daughter        type 2  . Cancer Daughter        brain cancer, diagnosed 29 years.agp  . Mental illness Daughter        s/p craniotomies, gamma knife for tumors. numerous  shunts.  . Heart failure Mother   . Cancer Brother        glioblastoma   . Glaucoma Brother   . Diverticulosis Sister   . Cancer Sister        liver  . Cancer Brother        melanoma, mets to lung and brain and intestine  . Alcohol abuse Brother   . Cancer Brother        liver  . Glaucoma Maternal Grandfather   . Cancer Sister        breast  . Colon cancer Neg Hx   . Hypertension Neg Hx     Social History   Socioeconomic History  . Marital status: Widowed    Spouse name: Not on file  . Number of children: 3  . Years of education: Not on file  . Highest education level: Not on file  Occupational History  . Not on file  Social Needs  . Financial resource strain: Not on file  . Food insecurity:    Worry: Not on file    Inability: Not on file  . Transportation needs:    Medical: Not on file  Non-medical: Not on file  Tobacco Use  . Smoking status: Never Smoker  . Smokeless tobacco: Never Used  Substance and Sexual Activity  . Alcohol use: No  . Drug use: No  . Sexual activity: Not on file  Lifestyle  . Physical activity:    Days per week: Not on file    Minutes per session: Not on file  . Stress: Not on file  Relationships  . Social connections:    Talks on phone: Not on file    Gets together: Not on file    Attends religious service: Not on file    Active member of club or organization: Not on file    Attends meetings of clubs or organizations: Not on file    Relationship status: Not on file  . Intimate partner violence:    Fear of current or ex partner: Not on file    Emotionally abused: Not on file    Physically abused: Not on file    Forced sexual activity: Not on file  Other Topics Concern  . Not on file  Social History Narrative  . Not on file    Outpatient Medications Prior to Visit  Medication Sig Dispense Refill  . ALPRAZolam (XANAX) 0.25 MG tablet TAKE 1 TABLET BY MOUTH TWICE DAILY AS NEEDED FOR ANXIETY OR SLEEP 30 tablet 0  . apixaban  (ELIQUIS) 5 MG TABS tablet Take 1 tablet (5 mg total) by mouth 2 (two) times daily. 180 tablet 1  . atorvastatin (LIPITOR) 40 MG tablet TAKE 1 TABLET(40 MG) BY MOUTH DAILY 30 tablet 0  . Cranberry 500 MG CAPS Take 500 mg by mouth daily.    Marland Kitchen levothyroxine (SYNTHROID, LEVOTHROID) 75 MCG tablet TAKE 1 TABLET(75 MCG) BY MOUTH DAILY 30 tablet 0  . loratadine (CLARITIN) 10 MG tablet Take 10 mg by mouth daily.    . Multiple Vitamins-Minerals (CENTRUM SILVER PO) Take 1 tablet by mouth daily.    Marland Kitchen PROVENTIL HFA 108 (90 Base) MCG/ACT inhaler INHALE 2 PUFFS INTO THE LUNGS EVERY 6 HOURS AS NEEDED FOR WHEEZING OR SHORTNESS OF BREATH 6.7 g 2  . verapamil (CALAN-SR) 240 MG CR tablet Take 1 tablet (240 mg total) by mouth daily. 30 tablet 4   No facility-administered medications prior to visit.     Allergies  Allergen Reactions  . Codeine Nausea And Vomiting    Review of Systems  Constitutional: Negative for fever and malaise/fatigue.  HENT: Negative for congestion.   Eyes: Negative for blurred vision.  Respiratory: Negative for shortness of breath.   Cardiovascular: Negative for chest pain, palpitations and leg swelling.  Gastrointestinal: Negative for abdominal pain, blood in stool and nausea.  Genitourinary: Negative for dysuria and frequency.  Musculoskeletal: Negative for falls.  Skin: Negative for rash.  Neurological: Negative for dizziness, loss of consciousness and headaches.  Endo/Heme/Allergies: Negative for environmental allergies.  Psychiatric/Behavioral: Negative for depression. The patient is not nervous/anxious.        Objective:    Physical Exam  Constitutional: She is oriented to person, place, and time. She appears well-developed and well-nourished. No distress.  HENT:  Head: Normocephalic and atraumatic.  Nose: Nose normal.  Eyes: Right eye exhibits no discharge. Left eye exhibits no discharge.  Neck: Normal range of motion. Neck supple.  Cardiovascular: Normal rate and  regular rhythm.  No murmur heard. Pulmonary/Chest: Effort normal and breath sounds normal.  Abdominal: Soft. Bowel sounds are normal. There is no tenderness.  Musculoskeletal: She exhibits no edema.  Neurological: She is alert and oriented to person, place, and time.  Skin: Skin is warm and dry.  Psychiatric: She has a normal mood and affect.  Nursing note and vitals reviewed.   BP (!) 122/58 (BP Location: Left Arm, Patient Position: Sitting, Cuff Size: Normal)   Pulse (!) 48   Temp 98 F (36.7 C) (Oral)   Resp 18   Wt 177 lb (80.3 kg)   SpO2 98%   BMI 33.44 kg/m  Wt Readings from Last 3 Encounters:  07/11/17 177 lb (80.3 kg)  07/07/17 177 lb 6.4 oz (80.5 kg)  07/03/17 174 lb (78.9 kg)     Lab Results  Component Value Date   WBC 5.7 02/18/2017   HGB 12.6 02/18/2017   HCT 37.3 02/18/2017   PLT 186.0 02/18/2017   GLUCOSE 77 02/18/2017   CHOL 130 02/18/2017   TRIG 46.0 02/18/2017   HDL 60.60 02/18/2017   LDLCALC 60 02/18/2017   ALT 20 02/18/2017   AST 21 02/18/2017   NA 136 02/18/2017   K 4.4 02/18/2017   CL 102 02/18/2017   CREATININE 1.26 (H) 02/18/2017   BUN 20 02/18/2017   CO2 26 02/18/2017   TSH 0.98 02/18/2017   INR 0.99 07/24/2012   HGBA1C 5.6 02/18/2017    Lab Results  Component Value Date   TSH 0.98 02/18/2017   Lab Results  Component Value Date   WBC 5.7 02/18/2017   HGB 12.6 02/18/2017   HCT 37.3 02/18/2017   MCV 96.7 02/18/2017   PLT 186.0 02/18/2017   Lab Results  Component Value Date   NA 136 02/18/2017   K 4.4 02/18/2017   CO2 26 02/18/2017   GLUCOSE 77 02/18/2017   BUN 20 02/18/2017   CREATININE 1.26 (H) 02/18/2017   BILITOT 0.9 02/18/2017   ALKPHOS 58 02/18/2017   AST 21 02/18/2017   ALT 20 02/18/2017   PROT 6.6 02/18/2017   ALBUMIN 3.6 02/18/2017   CALCIUM 8.5 02/18/2017   ANIONGAP 8 11/08/2016   GFR 43.01 (L) 02/18/2017   Lab Results  Component Value Date   CHOL 130 02/18/2017   Lab Results  Component Value Date    HDL 60.60 02/18/2017   Lab Results  Component Value Date   LDLCALC 60 02/18/2017   Lab Results  Component Value Date   TRIG 46.0 02/18/2017   Lab Results  Component Value Date   CHOLHDL 2 02/18/2017   Lab Results  Component Value Date   HGBA1C 5.6 02/18/2017       Assessment & Plan:   Problem List Items Addressed This Visit    HTN (hypertension)    Well controlled, no changes to meds. Encouraged heart healthy diet such as the DASH diet and exercise as tolerated.       Hyperglycemia    hgba1c acceptable, minimize simple carbs. Increase exercise as tolerated.      Visit for suture removal    5 sutures removed from above right eyebrow. Cleansed with H2O2 and removed without difficulty. Covered with antibiotic ointment and bandaid. No bleeding or concerns         I am having Marin Shutter maintain her Multiple Vitamins-Minerals (CENTRUM SILVER PO), Cranberry, PROVENTIL HFA, loratadine, apixaban, verapamil, atorvastatin, levothyroxine, and ALPRAZolam.  No orders of the defined types were placed in this encounter.   CMA served as Neurosurgeon during this visit. History, Physical and Plan performed by medical provider. Documentation and orders reviewed and attested to.  Danise Edge, MD

## 2017-07-13 NOTE — Progress Notes (Deleted)
HPI: FU atrial fibrillation.Nuclear study May 2014 showed breast attenuation but no ischemia. Ejection fraction 79%. Echocardiogram September 2018 showed normal LV function, mild mitral regurgitation and mild biatrial enlargement. Noted to be in new onset atrial 9/18. Seen in atrial fibrillation clinic; treated with rate control and anticoagulation. TSH normal.  Patient fell in May striking her head.  Head CT showed no intracranial hemorrhage; had facial laceration.  Since last seen,   Current Outpatient Medications  Medication Sig Dispense Refill  . ALPRAZolam (XANAX) 0.25 MG tablet TAKE 1 TABLET BY MOUTH TWICE DAILY AS NEEDED FOR ANXIETY OR SLEEP 30 tablet 0  . apixaban (ELIQUIS) 5 MG TABS tablet Take 1 tablet (5 mg total) by mouth 2 (two) times daily. 180 tablet 1  . atorvastatin (LIPITOR) 40 MG tablet TAKE 1 TABLET(40 MG) BY MOUTH DAILY 30 tablet 0  . Cranberry 500 MG CAPS Take 500 mg by mouth daily.    Marland Kitchen levothyroxine (SYNTHROID, LEVOTHROID) 75 MCG tablet TAKE 1 TABLET(75 MCG) BY MOUTH DAILY 30 tablet 0  . loratadine (CLARITIN) 10 MG tablet Take 10 mg by mouth daily.    . Multiple Vitamins-Minerals (CENTRUM SILVER PO) Take 1 tablet by mouth daily.    Marland Kitchen PROVENTIL HFA 108 (90 Base) MCG/ACT inhaler INHALE 2 PUFFS INTO THE LUNGS EVERY 6 HOURS AS NEEDED FOR WHEEZING OR SHORTNESS OF BREATH 6.7 g 2  . verapamil (CALAN-SR) 240 MG CR tablet Take 1 tablet (240 mg total) by mouth daily. 30 tablet 4   No current facility-administered medications for this visit.      Past Medical History:  Diagnosis Date  . Allergic rhinitis   . Anemia   . Anxiety   . Arthritis    bilateral knee  . Atrial fibrillation (HCC) 12/21/2016  . Cervical cancer screening 10/30/2013   Menarche at 15 Regular and moderate flow No history of abnormal pap in past G4P3, s/p 3 svd and 1 MC No history of abnormal MGM Noconcerns today No gyn surgeries  . Constipation 11/18/2015  . Depression with anxiety   .  Diverticulitis   . Heart murmur   . Hyperlipidemia   . Hypertension   . Hypothyroid   . Left knee DJD   . Medicare annual wellness visit, subsequent 10/30/2013  . PONV (postoperative nausea and vomiting)   . Preventative health care 05/20/2016    Past Surgical History:  Procedure Laterality Date  . ABDOMINAL HYSTERECTOMY    . bladder tack  04/2010   Dr Marciano Sequin  . GANGLION CYST EXCISION    . JOINT REPLACEMENT Right   . REPLACEMENT TOTAL KNEE  10/2010   right knee-Murphy   . TONSILLECTOMY    . TOTAL KNEE ARTHROPLASTY Left 07/26/2012   Procedure: TOTAL KNEE ARTHROPLASTY;  Surgeon: Loreta Ave, MD;  Location: [facility] OR;  Service: Orthopedics;  Laterality: Left;    Social History   Socioeconomic History  . Marital status: Widowed    Spouse name: Not on file  . Number of children: 3  . Years of education: Not on file  . Highest education level: Not on file  Occupational History  . Not on file  Social Needs  . Financial resource strain: Not on file  . Food insecurity:    Worry: Not on file    Inability: Not on file  . Transportation needs:    Medical: Not on file    Non-medical: Not on file  Tobacco Use  . Smoking status:  Never Smoker  . Smokeless tobacco: Never Used  Substance and Sexual Activity  . Alcohol use: No  . Drug use: No  . Sexual activity: Not on file  Lifestyle  . Physical activity:    Days per week: Not on file    Minutes per session: Not on file  . Stress: Not on file  Relationships  . Social connections:    Talks on phone: Not on file    Gets together: Not on file    Attends religious service: Not on file    Active member of club or organization: Not on file    Attends meetings of clubs or organizations: Not on file    Relationship status: Not on file  . Intimate partner violence:    Fear of current or ex partner: Not on file    Emotionally abused: Not on file    Physically abused: Not on file    Forced sexual activity: Not on file  Other  Topics Concern  . Not on file  Social History Narrative  . Not on file    Family History  Problem Relation Age of Onset  . Alcohol abuse Father   . Breast cancer Sister   . Cancer Sister        bladder  . Glaucoma Sister   . Arthritis Daughter   . Diabetes Daughter        type 2  . Cancer Daughter        brain cancer, diagnosed 29 years.agp  . Mental illness Daughter        s/p craniotomies, gamma knife for tumors. numerous shunts.  . Heart failure Mother   . Cancer Brother        glioblastoma   . Glaucoma Brother   . Diverticulosis Sister   . Cancer Sister        liver  . Cancer Brother        melanoma, mets to lung and brain and intestine  . Alcohol abuse Brother   . Cancer Brother        liver  . Glaucoma Maternal Grandfather   . Cancer Sister        breast  . Colon cancer Neg Hx   . Hypertension Neg Hx     ROS: no fevers or chills, productive cough, hemoptysis, dysphasia, odynophagia, melena, hematochezia, dysuria, hematuria, rash, seizure activity, orthopnea, PND, pedal edema, claudication. Remaining systems are negative.  Physical Exam: Well-developed well-nourished in no acute distress.  Skin is warm and dry.  HEENT is normal.  Neck is supple.  Chest is clear to auscultation with normal expansion.  Cardiovascular exam is regular rate and rhythm.  Abdominal exam nontender or distended. No masses palpated. Extremities show no edema. neuro grossly intact  ECG- personally reviewed  A/P  1 paroxysmal atrial fibrillation-plan to continue verapamil for rate control if atrial fibrillation recurs.  Continue apixaban.  Patient did fall recently.  If these episodes become more frequent would need to consider discontinuing anticoagulation.  2 hypertension-blood pressure is controlled.  Continue present medications.  3 hyperlipidemia-continue statin.  [name], MD

## 2017-07-20 ENCOUNTER — Ambulatory Visit: Payer: Medicare Other | Admitting: Cardiology

## 2017-07-26 ENCOUNTER — Other Ambulatory Visit: Payer: Self-pay | Admitting: Family Medicine

## 2017-08-01 ENCOUNTER — Other Ambulatory Visit: Payer: Self-pay | Admitting: Family Medicine

## 2017-08-01 DIAGNOSIS — I10 Essential (primary) hypertension: Secondary | ICD-10-CM

## 2017-08-01 NOTE — Telephone Encounter (Signed)
Requesting: XANAX 0.25 MG Contract: 12/21/16 UDS:12/21/16 Low risk Last OV: 07/11/17 Next OV: 10/10/17 Last Refill: 07/05/17   Please advise

## 2017-08-04 ENCOUNTER — Other Ambulatory Visit: Payer: Self-pay | Admitting: Family Medicine

## 2017-08-29 ENCOUNTER — Other Ambulatory Visit: Payer: Self-pay | Admitting: Family Medicine

## 2017-08-29 DIAGNOSIS — I10 Essential (primary) hypertension: Secondary | ICD-10-CM

## 2017-08-29 NOTE — Telephone Encounter (Signed)
Copied from CRM [phone]#[id number]. Topic: Quick Communication - See Telephone Encounter >> Aug 29, 2017 12:41 PM Tamela OddiMartin, [place], NT wrote: CRM for notification. See Telephone encounter for: 08/29/17. Patien called and states she needs a refill of her ALPRAZolam (XANAX) 0.25 MG tablet   [facility] [id number] - [place], [place] - [address] [phone] (Phone) [phone] (Fax)

## 2017-08-30 MED ORDER — ALPRAZOLAM 0.25 MG PO TABS: 0.2500 mg | ORAL_TABLET | Freq: Two times a day (BID) | ORAL | 0 refills | Status: DC | PRN

## 2017-08-30 NOTE — Telephone Encounter (Signed)
Rx refill request: Alprazolam 0.25mg      Last filled: 08/01/17   LOV: 07/07/17  PCP: Abner GreenspanBlyth  Pharmacy: verified

## 2017-08-30 NOTE — Telephone Encounter (Signed)
Requesting:xanax Contract: yes UDS: low risk next screen 12/21/17 Last OV:07/11/17 Next OV:10/10/17 Last Refill:08/01/17 Database:   Please advise

## 2017-09-01 ENCOUNTER — Other Ambulatory Visit: Payer: Self-pay | Admitting: Cardiology

## 2017-09-06 ENCOUNTER — Other Ambulatory Visit: Payer: Self-pay | Admitting: Family Medicine

## 2017-09-13 MED ORDER — APIXABAN 5 MG PO TABS: ORAL_TABLET | ORAL | 1 refills | Status: DC

## 2017-09-13 NOTE — Addendum Note (Signed)
Addended by: Chana BodeGREEN, Orrie Schubert L on: 09/13/2017 08:36 AM   Modules accepted: Orders

## 2017-09-21 ENCOUNTER — Other Ambulatory Visit: Payer: Self-pay | Admitting: Family Medicine

## 2017-09-21 DIAGNOSIS — I10 Essential (primary) hypertension: Secondary | ICD-10-CM

## 2017-09-21 NOTE — Telephone Encounter (Signed)
Request sent to provider for approval.  

## 2017-09-21 NOTE — Telephone Encounter (Signed)
Received refill request for ALPRAZolam (XANAX) 0.25 MG tablet. Last office visit 07/11/17 and last refill 08/30/17.  Contract signed: 12/21/16 UDS: low risk, next screen 12/21/17

## 2017-09-21 NOTE — Telephone Encounter (Signed)
I sent in refill but she needs Updated UDS at visit next month

## 2017-09-26 ENCOUNTER — Other Ambulatory Visit: Payer: Self-pay | Admitting: Cardiology

## 2017-10-03 ENCOUNTER — Other Ambulatory Visit: Payer: Medicare Other

## 2017-10-03 NOTE — Progress Notes (Signed)
HPI: FU atrial fibrillation.Nuclear study May 2014 showed breast attenuation but no ischemia. Ejection fraction 79%. Echocardiogram September 2018 showed normal LV function, mild mitral regurgitation and mild biatrial enlargement. Noted to be in new onset atrial 9/18. Seen in atrial fibrillation clinic; treated with rate control and anticoagulation. TSH normal. Since last seen, the patient denies any dyspnea on exertion, orthopnea, PND, pedal edema, palpitations, syncope or chest pain.   Current Outpatient Medications  Medication Sig Dispense Refill  . ALPRAZolam (XANAX) 0.25 MG tablet TAKE 1 TABLET(0.25 MG) BY MOUTH TWICE DAILY AS NEEDED FOR ANXIETY OR SLEEP 30 tablet 0  . apixaban (ELIQUIS) 5 MG TABS tablet TAKE 1 TABLET(5 MG) BY MOUTH TWICE DAILY 180 tablet 1  . atorvastatin (LIPITOR) 40 MG tablet TAKE 1 TABLET(40 MG) BY MOUTH DAILY 90 tablet 1  . Cranberry 500 MG CAPS Take 500 mg by mouth daily.    Marland Kitchen. levothyroxine (SYNTHROID, LEVOTHROID) 75 MCG tablet TAKE 1 TABLET(75 MCG) BY MOUTH DAILY 90 tablet 1  . loratadine (CLARITIN) 10 MG tablet Take 10 mg by mouth daily.    . Multiple Vitamins-Minerals (CENTRUM SILVER PO) Take 1 tablet by mouth daily.    Marland Kitchen. PROVENTIL HFA 108 (90 Base) MCG/ACT inhaler INHALE 2 PUFFS INTO THE LUNGS EVERY 6 HOURS AS NEEDED FOR WHEEZING OR SHORTNESS OF BREATH 6.7 g 2  . verapamil (CALAN-SR) 240 MG CR tablet Take 1 tablet (240 mg total) by mouth daily. KEEP OV. 30 tablet 0   No current facility-administered medications for this visit.      Past Medical History:  Diagnosis Date  . Allergic rhinitis   . Anemia   . Anxiety   . Arthritis    bilateral knee  . Atrial fibrillation (HCC) 12/21/2016  . Cervical cancer screening 10/30/2013   Menarche at 15 Regular and moderate flow No history of abnormal pap in past G4P3, s/p 3 svd and 1 MC No history of abnormal MGM Noconcerns today No gyn surgeries  . Constipation 11/18/2015  . Depression with anxiety   .  Diverticulitis   . Heart murmur   . Hyperlipidemia   . Hypertension   . Hypothyroid   . Left knee DJD   . Medicare annual wellness visit, subsequent 10/30/2013  . PONV (postoperative nausea and vomiting)   . Preventative health care 05/20/2016    Past Surgical History:  Procedure Laterality Date  . ABDOMINAL HYSTERECTOMY    . bladder tack  04/2010   Dr Marciano Sequinimothy  [place]  . GANGLION CYST EXCISION    . JOINT REPLACEMENT Right   . REPLACEMENT TOTAL KNEE  10/2010   right knee-Murphy   . TONSILLECTOMY    . TOTAL KNEE ARTHROPLASTY Left 07/26/2012   Procedure: TOTAL KNEE ARTHROPLASTY;  Surgeon: Loreta Aveaniel F Murphy, MD;  [facility];  Service: Orthopedics;  Laterality: Left;    Social History   Socioeconomic History  . Marital status: Widowed    Spouse name: Not on file  . Number of children: 3  . Years of education: Not on file  . Highest education level: Not on file  Occupational History  . Not on file  Social Needs  . Financial resource strain: Not on file  . Food insecurity:    Worry: Not on file    Inability: Not on file  . Transportation needs:    Medical: Not on file    Non-medical: Not on file  Tobacco Use  . Smoking status: Never Smoker  .  Smokeless tobacco: Never Used  Substance and Sexual Activity  . Alcohol use: No  . Drug use: No  . Sexual activity: Not on file  Lifestyle  . Physical activity:    Days per week: Not on file    Minutes per session: Not on file  . Stress: Not on file  Relationships  . Social connections:    Talks on phone: Not on file    Gets together: Not on file    Attends religious service: Not on file    Active member of club or organization: Not on file    Attends meetings of clubs or organizations: Not on file    Relationship status: Not on file  . Intimate partner violence:    Fear of current or ex partner: Not on file    Emotionally abused: Not on file    Physically abused: Not on file    Forced sexual activity: Not on file  Other  Topics Concern  . Not on file  Social History Narrative  . Not on file    Family History  Problem Relation Age of Onset  . Alcohol abuse Father   . Breast cancer Sister   . Cancer Sister        bladder  . Glaucoma Sister   . Arthritis Daughter   . Diabetes Daughter        type 2  . Cancer Daughter        brain cancer, diagnosed 29 years.agp  . Mental illness Daughter        s/p craniotomies, gamma knife for tumors. numerous shunts.  . Heart failure Mother   . Cancer Brother        glioblastoma   . Glaucoma Brother   . Diverticulosis Sister   . Cancer Sister        liver  . Cancer Brother        melanoma, mets to lung and brain and intestine  . Alcohol abuse Brother   . Cancer Brother        liver  . Glaucoma Maternal Grandfather   . Cancer Sister        breast  . Colon cancer Neg Hx   . Hypertension Neg Hx     ROS: no fevers or chills, productive cough, hemoptysis, dysphasia, odynophagia, melena, hematochezia, dysuria, hematuria, rash, seizure activity, orthopnea, PND, pedal edema, claudication. Remaining systems are negative.  Physical Exam: Well-developed well-nourished in no acute distress.  Skin is warm and dry.  HEENT is normal.  Neck is supple.  Chest is clear to auscultation with normal expansion.  Cardiovascular exam is regular rate and rhythm.  Abdominal exam nontender or distended. No masses palpated. Extremities show no edema. neuro grossly intact  ECG-sinus rhythm at a rate of 55.  Nonspecific ST changes.  Personally reviewed  A/P  1 paroxysmal atrial fibrillation-plan to continue verapamil for rate control.  Continue apixaban.  Check hgb and renal function.    2 hypertension-blood pressure is controlled.  Continue present medications.  3 hyperlipidemia-continue statin.  Olga MillersBrian Chadley Dziedzic, MD

## 2017-10-05 ENCOUNTER — Encounter: Payer: Self-pay | Admitting: Cardiology

## 2017-10-05 ENCOUNTER — Encounter

## 2017-10-05 ENCOUNTER — Ambulatory Visit (INDEPENDENT_AMBULATORY_CARE_PROVIDER_SITE_OTHER): Payer: Medicare Other | Admitting: Cardiology

## 2017-10-05 VITALS — BP 140/70 | HR 55 | Ht 61.0 in | Wt 177.8 lb

## 2017-10-05 DIAGNOSIS — I480 Paroxysmal atrial fibrillation: Secondary | ICD-10-CM | POA: Diagnosis not present

## 2017-10-05 DIAGNOSIS — I10 Essential (primary) hypertension: Secondary | ICD-10-CM

## 2017-10-05 DIAGNOSIS — E7800 Pure hypercholesterolemia, unspecified: Secondary | ICD-10-CM

## 2017-10-05 MED ORDER — APIXABAN 5 MG PO TABS: ORAL_TABLET | ORAL | 3 refills | Status: DC

## 2017-10-05 NOTE — Patient Instructions (Signed)
Medication Instructions:   NO CHANGE  Labwork:  Your physician recommends that you HAVE LAB WORK TODAY  Follow-Up:  Your physician wants you to follow-up in: ONE YEAR WITH DR CRENSHAW You will receive a reminder letter in the mail two months in advance. If you don't receive a letter, please call our office to schedule the follow-up appointment.     

## 2017-10-06 LAB — BASIC METABOLIC PANEL
BUN/Creatinine Ratio: 18 (ref 12–28)
BUN: 22 mg/dL (ref 8–27)
CO2: 21 mmol/L (ref 20–29)
CREATININE: 1.21 mg/dL — AB (ref 0.57–1.00)
Calcium: 9.1 mg/dL (ref 8.7–10.3)
Chloride: 104 mmol/L (ref 96–106)
GFR, EST AFRICAN AMERICAN: 47 mL/min/{1.73_m2} — AB (ref >59)
GFR, EST NON AFRICAN AMERICAN: 41 mL/min/{1.73_m2} — AB (ref >59)
Glucose: 79 mg/dL (ref 65–99)
POTASSIUM: 4.7 mmol/L (ref 3.5–5.2)
SODIUM: 141 mmol/L (ref 134–144)

## 2017-10-06 LAB — CBC
HEMATOCRIT: 39.6 % (ref 34.0–46.6)
Hemoglobin: 13.3 g/dL (ref 11.1–15.9)
MCH: 31.4 pg (ref 26.6–33.0)
MCHC: 33.6 g/dL (ref 31.5–35.7)
MCV: 94 fL (ref 79–97)
Platelets: 237 10*3/uL (ref 150–450)
RBC: 4.23 x10E6/uL (ref 3.77–5.28)
RDW: 12.2 % — AB (ref 12.3–15.4)
WBC: 9.8 10*3/uL (ref 3.4–10.8)

## 2017-10-10 ENCOUNTER — Encounter: Payer: Medicare Other | Admitting: Family Medicine

## 2017-10-21 ENCOUNTER — Other Ambulatory Visit: Payer: Self-pay | Admitting: Family Medicine

## 2017-10-21 DIAGNOSIS — I10 Essential (primary) hypertension: Secondary | ICD-10-CM

## 2017-10-21 NOTE — Telephone Encounter (Signed)
Requesting:xanax Contract:yes UDS:low risk next screen 12/21/17 Last OV:07/11/17 Next OV:11/01/17 Last Refill:09/21/17 #30-0rf Database:   Please advise

## 2017-10-28 ENCOUNTER — Other Ambulatory Visit: Payer: Self-pay | Admitting: Cardiology

## 2017-11-01 ENCOUNTER — Encounter: Payer: Medicare Other | Admitting: Family Medicine

## 2017-11-23 ENCOUNTER — Other Ambulatory Visit: Payer: Self-pay | Admitting: Family Medicine

## 2017-11-23 DIAGNOSIS — I10 Essential (primary) hypertension: Secondary | ICD-10-CM

## 2017-11-24 NOTE — Telephone Encounter (Signed)
Requesting:xanax Contract:yes UDS:low risk next screen 12/21/17 Last OV:07/11/17 Next OV:01/05/18 Last Refill:10/24/17 #30-0rf Database:   Please advise

## 2017-12-23 ENCOUNTER — Other Ambulatory Visit: Payer: Self-pay | Admitting: Family Medicine

## 2017-12-23 DIAGNOSIS — I10 Essential (primary) hypertension: Secondary | ICD-10-CM

## 2017-12-23 NOTE — Telephone Encounter (Signed)
Copied from CRM [id number]. Topic: Quick Communication - Rx Refill/Question >> Dec 23, 2017  3:46 PM Jens Som A wrote: Medication: ALPRAZolam [name] 0.25 MG tablet [[id number]]   Has the patient contacted their pharmacy? Yes  (Agent: If no, request that the patient contact the pharmacy for the refill.) (Agent: If yes, when and what did the pharmacy advise?)  Preferred Pharmacy (with phone number or street name): [id number] - [place], [place] - [address] [place] [address] Phone: [phone] Fax: [phone]    Agent: Please be advised that RX refills may take up to 3 business days. We ask that you follow-up with your pharmacy.

## 2017-12-23 NOTE — Telephone Encounter (Signed)
Requesting:xanax Contract:yes UDS:low risk next screen 12/21/17 Last OV:07/11/17 Next OV:01/05/18 Last Refill:11/24/17  #30-0rf Database:   Please advise

## 2017-12-27 NOTE — Telephone Encounter (Signed)
Refilled 12/25/2017.

## 2018-01-05 ENCOUNTER — Ambulatory Visit (INDEPENDENT_AMBULATORY_CARE_PROVIDER_SITE_OTHER): Payer: Medicare Other | Admitting: Family Medicine

## 2018-01-05 VITALS — BP 104/62 | HR 63 | Temp 97.9°F | Resp 18 | Ht 61.0 in | Wt 177.0 lb

## 2018-01-05 DIAGNOSIS — R6883 Chills (without fever): Secondary | ICD-10-CM

## 2018-01-05 DIAGNOSIS — G4701 Insomnia due to medical condition: Secondary | ICD-10-CM | POA: Diagnosis not present

## 2018-01-05 DIAGNOSIS — D649 Anemia, unspecified: Secondary | ICD-10-CM

## 2018-01-05 DIAGNOSIS — E039 Hypothyroidism, unspecified: Secondary | ICD-10-CM | POA: Diagnosis not present

## 2018-01-05 DIAGNOSIS — R739 Hyperglycemia, unspecified: Secondary | ICD-10-CM | POA: Diagnosis not present

## 2018-01-05 DIAGNOSIS — N390 Urinary tract infection, site not specified: Secondary | ICD-10-CM

## 2018-01-05 DIAGNOSIS — I10 Essential (primary) hypertension: Secondary | ICD-10-CM

## 2018-01-05 DIAGNOSIS — E7800 Pure hypercholesterolemia, unspecified: Secondary | ICD-10-CM

## 2018-01-05 DIAGNOSIS — I4891 Unspecified atrial fibrillation: Secondary | ICD-10-CM

## 2018-01-05 DIAGNOSIS — Z0000 Encounter for general adult medical examination without abnormal findings: Secondary | ICD-10-CM

## 2018-01-05 DIAGNOSIS — Z23 Encounter for immunization: Secondary | ICD-10-CM | POA: Diagnosis not present

## 2018-01-05 MED ORDER — ALPRAZOLAM 0.25 MG PO TABS: ORAL_TABLET | ORAL | 5 refills | Status: DC

## 2018-01-05 NOTE — Assessment & Plan Note (Signed)
hgba1c acceptable, minimize simple carbs. Increase exercise as tolerated.  

## 2018-01-05 NOTE — Progress Notes (Signed)
Subjective:    Patient ID: Alyssa Williamson, female    DOB: 1931/07/28, 84 y.o.   MRN: [id number]  No chief complaint on file.   HPI Patient is in today for annual preventative exam and follow-up on chronic medical concerns including hypertension, hyperlipidemia, hypothyroidism and recurrent UTIs.  She notes she is been having intermittent trouble with chills mostly at night for about 3 weeks now.  Also notes about 10 days worth of congestion productive of clear sputum.  She continues to struggle with stress secondary to her daughter's significant mental illness.  She tries to maintain a heart healthy diet and stay active.  She manages her activities of daily living well.  No recent hospitalization or acute febrile illness. Denies CP/palp/SOB/HA/fevers/GI or GU c/o. Taking meds as prescribed  Past Medical History:  Diagnosis Date  . Allergic rhinitis   . Anemia   . Anxiety   . Arthritis    bilateral knee  . Atrial fibrillation (HCC) 12/21/2016  . Cervical cancer screening 10/30/2013   Menarche at 15 Regular and moderate flow No history of abnormal pap in past G4P3, s/p 3 svd and 1 MC No history of abnormal MGM Noconcerns today No gyn surgeries  . Constipation 11/18/2015  . Depression with anxiety   . Diverticulitis   . Heart murmur   . Hyperlipidemia   . Hypertension   . Hypothyroid   . Left knee DJD   . Medicare annual wellness visit, subsequent 10/30/2013  . PONV (postoperative nausea and vomiting)   . Preventative health care 05/20/2016    Past Surgical History:  Procedure Laterality Date  . ABDOMINAL HYSTERECTOMY    . bladder tack  04/2010   Dr Marciano Sequin  . GANGLION CYST EXCISION    . JOINT REPLACEMENT Right   . REPLACEMENT TOTAL KNEE  10/2010   right knee-Murphy   . TONSILLECTOMY    . TOTAL KNEE ARTHROPLASTY Left 07/26/2012   Procedure: TOTAL KNEE ARTHROPLASTY;  Surgeon: Loreta Ave, MD;  Location: [facility] OR;  Service: Orthopedics;  Laterality: Left;    Family History    Problem Relation Age of Onset  . Alcohol abuse Father   . Breast cancer Sister   . Cancer Sister        bladder  . Glaucoma Sister   . Arthritis Daughter   . Diabetes Daughter        type 2  . Cancer Daughter        brain cancer, diagnosed 29 years.agp  . Mental illness Daughter        s/p craniotomies, gamma knife for tumors. numerous shunts.  . Heart failure Mother   . Cancer Brother        glioblastoma   . Glaucoma Brother   . Diverticulosis Sister   . Cancer Sister        liver  . Cancer Brother        melanoma, mets to lung and brain and intestine  . Alcohol abuse Brother   . Cancer Brother        liver  . Glaucoma Maternal Grandfather   . Cancer Sister        breast  . Colon cancer Neg Hx   . Hypertension Neg Hx     Social History   Socioeconomic History  . Marital status: Widowed    Spouse name: Not on file  . Number of children: 3  . Years of education: Not on file  . Highest education  level: Not on file  Occupational History  . Not on file  Social Needs  . Financial resource strain: Not on file  . Food insecurity:    Worry: Not on file    Inability: Not on file  . Transportation needs:    Medical: Not on file    Non-medical: Not on file  Tobacco Use  . Smoking status: Never Smoker  . Smokeless tobacco: Never Used  Substance and Sexual Activity  . Alcohol use: No  . Drug use: No  . Sexual activity: Not on file  Lifestyle  . Physical activity:    Days per week: Not on file    Minutes per session: Not on file  . Stress: Not on file  Relationships  . Social connections:    Talks on phone: Not on file    Gets together: Not on file    Attends religious service: Not on file    Active member of club or organization: Not on file    Attends meetings of clubs or organizations: Not on file    Relationship status: Not on file  . Intimate partner violence:    Fear of current or ex partner: Not on file    Emotionally abused: Not on file     Physically abused: Not on file    Forced sexual activity: Not on file  Other Topics Concern  . Not on file  Social History Narrative  . Not on file    Outpatient Medications Prior to Visit  Medication Sig Dispense Refill  . apixaban (ELIQUIS) 5 MG TABS tablet TAKE 1 TABLET(5 MG) BY MOUTH TWICE DAILY 180 tablet 3  . atorvastatin (LIPITOR) 40 MG tablet TAKE 1 TABLET(40 MG) BY MOUTH DAILY 90 tablet 1  . Cranberry 500 MG CAPS Take 500 mg by mouth daily.    Marland Kitchen. levothyroxine (SYNTHROID, LEVOTHROID) 75 MCG tablet TAKE 1 TABLET(75 MCG) BY MOUTH DAILY 90 tablet 1  . loratadine (CLARITIN) 10 MG tablet Take 10 mg by mouth daily.    . Multiple Vitamins-Minerals (CENTRUM SILVER PO) Take 1 tablet by mouth daily.    Marland Kitchen. PROVENTIL HFA 108 (90 Base) MCG/ACT inhaler INHALE 2 PUFFS INTO THE LUNGS EVERY 6 HOURS AS NEEDED FOR WHEEZING OR SHORTNESS OF BREATH 6.7 g 2  . verapamil (CALAN-SR) 240 MG CR tablet TAKE 1 TABLET BY MOUTH DAILY 30 tablet 11  . ALPRAZolam (XANAX) 0.25 MG tablet TAKE 1 TABLET(0.25 MG) BY MOUTH TWICE DAILY AS NEEDED FOR ANXIETY OR SLEEP 30 tablet 0   No facility-administered medications prior to visit.     Allergies  Allergen Reactions  . Codeine Nausea And Vomiting    Review of Systems  Constitutional: Positive for chills and malaise/fatigue. Negative for fever.  HENT: Positive for congestion.   Eyes: Negative for blurred vision.  Respiratory: Positive for sputum production. Negative for shortness of breath.   Cardiovascular: Negative for chest pain, palpitations and leg swelling.  Gastrointestinal: Negative for abdominal pain, blood in stool, heartburn and nausea.  Genitourinary: Negative for dysuria and frequency.  Musculoskeletal: Negative for falls.  Skin: Negative for rash.  Neurological: Negative for dizziness, loss of consciousness and headaches.  Endo/Heme/Allergies: Negative for environmental allergies.  Psychiatric/Behavioral: Negative for depression. The patient is not  nervous/anxious.        Objective:    Physical Exam  Constitutional: She is oriented to person, place, and time. She appears well-developed and well-nourished. No distress.  HENT:  Head: Normocephalic and atraumatic.  Nose: Nose  normal.  Eyes: Right eye exhibits no discharge. Left eye exhibits no discharge.  Neck: Normal range of motion. Neck supple.  Cardiovascular: Normal rate.  No murmur heard. irreulgaly irregular  Pulmonary/Chest: Effort normal and breath sounds normal.  Abdominal: Soft. Bowel sounds are normal. There is no tenderness.  Musculoskeletal: She exhibits no edema.  Neurological: She is alert and oriented to person, place, and time.  Skin: Skin is warm and dry.  Psychiatric: She has a normal mood and affect.  Nursing note and vitals reviewed.   BP 104/62 (BP Location: Left Arm, Patient Position: Sitting, Cuff Size: Large)   Pulse 63   Temp 97.9 F (36.6 C) (Oral)   Resp 18   Ht 5\' 1"  (1.549 m)   Wt 177 lb (80.3 kg)   SpO2 97%   BMI 33.44 kg/m  Wt Readings from Last 3 Encounters:  01/05/18 177 lb (80.3 kg)  10/05/17 177 lb 12.8 oz (80.6 kg)  07/11/17 177 lb (80.3 kg)     Lab Results  Component Value Date   WBC 7.6 01/05/2018   HGB 13.7 01/05/2018   HCT 39.9 01/05/2018   PLT 235.0 01/05/2018   GLUCOSE 97 01/05/2018   CHOL 165 01/05/2018   TRIG 128.0 01/05/2018   HDL 63.20 01/05/2018   LDLCALC 77 01/05/2018   ALT 44 (H) 01/05/2018   AST 33 01/05/2018   NA 139 01/05/2018   K 4.7 01/05/2018   CL 102 01/05/2018   CREATININE 1.47 (H) 01/05/2018   BUN 22 01/05/2018   CO2 30 01/05/2018   TSH 0.50 01/05/2018   INR 0.99 07/24/2012   HGBA1C 5.6 01/05/2018    Lab Results  Component Value Date   TSH 0.50 01/05/2018   Lab Results  Component Value Date   WBC 7.6 01/05/2018   HGB 13.7 01/05/2018   HCT 39.9 01/05/2018   MCV 97.3 01/05/2018   PLT 235.0 01/05/2018   Lab Results  Component Value Date   NA 139 01/05/2018   K 4.7 01/05/2018    CO2 30 01/05/2018   GLUCOSE 97 01/05/2018   BUN 22 01/05/2018   CREATININE 1.47 (H) 01/05/2018   BILITOT 0.8 01/05/2018   ALKPHOS 203 (H) 01/05/2018   AST 33 01/05/2018   ALT 44 (H) 01/05/2018   PROT 6.7 01/05/2018   ALBUMIN 3.9 01/05/2018   CALCIUM 9.4 01/05/2018   ANIONGAP 8 11/08/2016   GFR 35.92 (L) 01/05/2018   Lab Results  Component Value Date   CHOL 165 01/05/2018   Lab Results  Component Value Date   HDL 63.20 01/05/2018   Lab Results  Component Value Date   LDLCALC 77 01/05/2018   Lab Results  Component Value Date   TRIG 128.0 01/05/2018   Lab Results  Component Value Date   CHOLHDL 3 01/05/2018   Lab Results  Component Value Date   HGBA1C 5.6 01/05/2018       Assessment & Plan:   Problem List Items Addressed This Visit    Hypothyroidism    On Levothyroxine, continue to monitor      Relevant Orders   Lipid panel (Completed)   TSH (Completed)   HTN (hypertension)    Well controlled, no changes to meds. Encouraged heart healthy diet such as the DASH diet and exercise as tolerated      Relevant Orders   CBC (Completed)   Comprehensive metabolic panel (Completed)   Hyperlipidemia    Encouraged heart healthy diet, increase exercise, avoid trans fats, consider a  krill oil cap daily      Recurrent UTI    ecoli uti identified, treated with abx      Anemia    Increase leafy greens, consider increased lean red meat and using cast iron cookware. Continue to monitor, report any concerns      Hyperglycemia    hgba1c acceptable, minimize simple carbs. Increase exercise as tolerated.       Relevant Orders   Hemoglobin A1c (Completed)   Preventative health care    Patient encouraged to maintain heart healthy diet, regular exercise, adequate sleep. Consider daily probiotics. Take medications as prescribed. Labs revieweed.       Atrial fibrillation (HCC)    Rate controlled tolerating Eliquis       Other Visit Diagnoses    Insomnia due to  medical condition    -  Primary   Benign essential HTN       Relevant Medications   ALPRAZolam (XANAX) 0.25 MG tablet   Chills       Relevant Orders   Urinalysis   Urine Culture (Completed)      I am having Marin Shutter maintain her Multiple Vitamins-Minerals (CENTRUM SILVER PO), Cranberry, PROVENTIL HFA, loratadine, levothyroxine, atorvastatin, apixaban, verapamil, and ALPRAZolam.  Meds ordered this encounter  Medications  . ALPRAZolam (XANAX) 0.25 MG tablet    Sig: TAKE 1 TABLET(0.25 MG) BY MOUTH TWICE DAILY AS NEEDED FOR ANXIETY OR SLEEP    Dispense:  30 tablet    Refill:  5     Danise Edge, MD

## 2018-01-05 NOTE — Assessment & Plan Note (Signed)
Increase leafy greens, consider increased lean red meat and using cast iron cookware. Continue to monitor, report any concerns 

## 2018-01-05 NOTE — Assessment & Plan Note (Signed)
Well controlled, no changes to meds. Encouraged heart healthy diet such as the DASH diet and exercise as tolerated.  °

## 2018-01-05 NOTE — Assessment & Plan Note (Signed)
On Levothyroxine, continue to monitor 

## 2018-01-05 NOTE — Assessment & Plan Note (Signed)
Encouraged heart healthy diet, increase exercise, avoid trans fats, consider a krill oil cap daily 

## 2018-01-05 NOTE — Patient Instructions (Addendum)

## 2018-01-06 LAB — LIPID PANEL
CHOL/HDL RATIO: 3
Cholesterol: 165 mg/dL (ref 0–200)
HDL: 63.2 mg/dL (ref >39.00)
LDL CALC: 77 mg/dL (ref 0–99)
NonHDL: 102.21
Triglycerides: 128 mg/dL (ref 0.0–149.0)
VLDL: 25.6 mg/dL (ref 0.0–40.0)

## 2018-01-06 LAB — CBC
HCT: 39.9 % (ref 36.0–46.0)
Hemoglobin: 13.7 g/dL (ref 12.0–15.0)
MCHC: 34.4 g/dL (ref 30.0–36.0)
MCV: 97.3 fl (ref 78.0–100.0)
Platelets: 235 10*3/uL (ref 150.0–400.0)
RBC: 4.1 Mil/uL (ref 3.87–5.11)
RDW: 13.4 % (ref 11.5–15.5)
WBC: 7.6 10*3/uL (ref 4.0–10.5)

## 2018-01-06 LAB — COMPREHENSIVE METABOLIC PANEL
ALBUMIN: 3.9 g/dL (ref 3.5–5.2)
ALT: 44 U/L — ABNORMAL HIGH (ref 0–35)
AST: 33 U/L (ref 0–37)
Alkaline Phosphatase: 203 U/L — ABNORMAL HIGH (ref 39–117)
BUN: 22 mg/dL (ref 6–23)
CHLORIDE: 102 meq/L (ref 96–112)
CO2: 30 meq/L (ref 19–32)
CREATININE: 1.47 mg/dL — AB (ref 0.40–1.20)
Calcium: 9.4 mg/dL (ref 8.4–10.5)
GFR: 35.92 mL/min — ABNORMAL LOW (ref >60.00)
Glucose, Bld: 97 mg/dL (ref 70–99)
Potassium: 4.7 mEq/L (ref 3.5–5.1)
Sodium: 139 mEq/L (ref 135–145)
Total Bilirubin: 0.8 mg/dL (ref 0.2–1.2)
Total Protein: 6.7 g/dL (ref 6.0–8.3)

## 2018-01-06 LAB — URINALYSIS, ROUTINE W REFLEX MICROSCOPIC
BILIRUBIN URINE: NEGATIVE
Hgb urine dipstick: NEGATIVE
KETONES UR: NEGATIVE
NITRITE: NEGATIVE
PH: 6 (ref 5.0–8.0)
Specific Gravity, Urine: <=1.005 — AB (ref 1.000–1.030)
Total Protein, Urine: NEGATIVE
Urine Glucose: NEGATIVE
Urobilinogen, UA: 0.2 (ref 0.0–1.0)

## 2018-01-06 LAB — TSH: TSH: 0.5 u[IU]/mL (ref 0.35–4.50)

## 2018-01-06 LAB — HEMOGLOBIN A1C: HEMOGLOBIN A1C: 5.6 % (ref 4.6–6.5)

## 2018-01-08 ENCOUNTER — Other Ambulatory Visit: Payer: Self-pay | Admitting: Family Medicine

## 2018-01-08 LAB — URINE CULTURE
MICRO NUMBER: 91373138
SPECIMEN QUALITY:: ADEQUATE

## 2018-01-08 MED ORDER — CEFDINIR 300 MG PO CAPS: 300.0000 mg | ORAL_CAPSULE | Freq: Two times a day (BID) | ORAL | 0 refills | Status: DC

## 2018-01-08 NOTE — Assessment & Plan Note (Signed)
Rate controlled tolerating Eliquis 

## 2018-01-08 NOTE — Assessment & Plan Note (Signed)
Patient encouraged to maintain heart healthy diet, regular exercise, adequate sleep. Consider daily probiotics. Take medications as prescribed. Labs revieweed.

## 2018-01-08 NOTE — Assessment & Plan Note (Signed)
ecoli uti identified, treated with abx

## 2018-01-09 NOTE — Addendum Note (Signed)
Addended by: Crissie SicklesARTER, [place] Mckeough A on: 01/09/2018 10:07 AM   Modules accepted: Orders

## 2018-01-11 ENCOUNTER — Ambulatory Visit: Payer: Self-pay

## 2018-01-11 NOTE — Telephone Encounter (Signed)
Returned call to pt. To discuss allergic symptoms.  Stated she started taking Cefdinir on Monday for a UTI.  Reported she took 2 doses on Monday and 2 doses on Tuesday.  Stated she started having "itching of whole body, but mainly the head, hands, and feet" on Monday that continued through Tuesday.  Reported she did not see any rash.  Denied any swelling of mouth, lips, tongue, or throat.  Denied shortness of breath.  Stated she did not take the Cefdinir today, and she does not have any further itching.  Reported she is on Claritin daily, and did take this today.  Was placed on Cefdinir for E. Coli in urine.  Denied any urinary symptoms at this time.  Will make Dr. Abner GreenspanBlyth aware of reaction, and request further recommendations.  Pt. given care advice per protocol.  Verb. Understanding.       Reason for Disposition . [1] MODERATE-SEVERE widespread itching (i.e., interferes with sleep, normal activities or school) AND [2] not improved after 24 hours of itching Care Advice    C/o severe itching x 48 hrs. while taking Cefdinir for UTI; Denied rash, swelling of mouth, swelling of extremities, or shortness of breath.  Symptoms have subsided since stopping medication on Tues. night.  Will not sched. office appt., but send note to PCP.  Answer Assessment - Initial Assessment Questions 1. DESCRIPTION: "Describe the itching you are having."     Head, hands, and feet were itching ; "it was really my whole body "  2. SEVERITY: "How bad is it?"    - MILD - doesn't interfere with normal activities   - MODERATE - SEVERE: interferes with work, school, sleep, or other activities      Severe 3. SCRATCHING: "Are there any scratch marks? Bleeding?"     Scratched; especially her hands; denied any break in skin/ bleeding  4. ONSET: "When did this begin?"      Monday 5. CAUSE: "What do you think is causing the itching?" (ask about swimming pools, pollen, animals, soaps, etc.)     Cefdinir 6. OTHER SYMPTOMS: "Do you have  any other symptoms?"      Denied any swelling of mouth, lips, tongue or throat; denied shortness of          breath, denied rash  7. PREGNANCY: "Is there any chance you are pregnant?" "When was your last menstrual period?"     N/a  Protocols used: ITCHING [facility]- WIDESPREAD-A-AH

## 2018-01-11 NOTE — Telephone Encounter (Signed)
Please add Cefdinir to her allergies and send in cipofloxacin 250 mg tabs, 1 tab po bid x 3 days instead that should work and is IT consultantvey different.

## 2018-01-12 MED ORDER — CIPROFLOXACIN HCL 250 MG PO TABS: 250.0000 mg | ORAL_TABLET | Freq: Two times a day (BID) | ORAL | 0 refills | Status: DC

## 2018-01-12 NOTE — Addendum Note (Signed)
Addended by: Crissie SicklesARTER, Messiyah Waterson A on: 01/12/2018 04:11 PM   Modules accepted: Orders

## 2018-01-12 NOTE — Telephone Encounter (Signed)
Medications sent in  Patient made aware

## 2018-01-27 ENCOUNTER — Other Ambulatory Visit: Payer: Self-pay | Admitting: Family Medicine

## 2018-01-27 ENCOUNTER — Ambulatory Visit: Payer: Self-pay | Admitting: *Deleted

## 2018-01-27 NOTE — Telephone Encounter (Signed)
"  Pt called stating she is "sore" in stomach/upper area below breast bone. She said that this is the 3x this has happened. Last night pt had chills, vomiting, and sweating. Denies feeling faint or dizzy. Please call back to discuss".  Called patient back regarding soreness in her upper abd. She stated at first she thought it was what she had eaten. Had gone to the beach, on the way back home stopped at [employer]. She develop chills, sweating and vomited yesterday. And has soreness across her upper abd, and it is constant. She denies diarrhea or fever.  She wanted Dr. Rogelia RohrerBlythe to know.  Because of her age and discomfort, per protocol, pt is going to an ED. She thinks it may be her gall bladder and wants to get that checked out. [facility]. Pt advised to call back for any other concerns. Pt voiced understanding.  Reason for Disposition . [1] MILD-MODERATE pain AND [2] constant AND [3] present > 2 hours  Answer Assessment - Initial Assessment Questions 1. LOCATION: "Where does it hurt?"      Upper abd 2. RADIATION: "Does the pain shoot anywhere else?" (e.g., chest, back)     Goes a little to the back 3. ONSET: "When did the pain begin?" (e.g., minutes, hours or days ago)      today 4. SUDDEN: "Gradual or sudden onset?"     gradual 5. PATTERN "Does the pain come and go, or is it constant?"    - If constant: "Is it getting better, staying the same, or worsening?"      (Note: Constant means the pain never goes away completely; most serious pain is constant and it progresses)     - If intermittent: "How long does it last?" "Do you have pain now?"     (Note: Intermittent means the pain goes away completely between bouts)     constant 6. SEVERITY: "How bad is the pain?"  (e.g., Scale 1-10; mild, moderate, or severe)    - MILD (1-3): doesn't interfere with normal activities, abdomen soft and not tender to touch     - MODERATE (4-7): interferes with normal activities or awakens  from sleep, tender to touch     - SEVERE (8-10): excruciating pain, doubled over, unable to do any normal activities       Soreness #5  7. RECURRENT SYMPTOM: "Have you ever had this type of abdominal pain before?" If so, ask: "When was the last time?" and "What happened that time?"      Yes about 6 weeks ago 8. AGGRAVATING FACTORS: "Does anything seem to cause this pain?" (e.g., foods, stress, alcohol)     Not sure 9. CARDIAC SYMPTOMS: "Do you have any of the following symptoms: chest pain, difficulty breathing, sweating, nausea?"     Sweating and nausea 10. OTHER SYMPTOMS: "Do you have any other symptoms?" (e.g., fever, vomiting, diarrhea)       Vomited last night  Protocols used: ABDOMINAL PAIN - UPPER-A-AH

## 2018-01-30 ENCOUNTER — Other Ambulatory Visit (INDEPENDENT_AMBULATORY_CARE_PROVIDER_SITE_OTHER): Payer: Medicare Other

## 2018-01-30 DIAGNOSIS — I10 Essential (primary) hypertension: Secondary | ICD-10-CM

## 2018-01-30 LAB — COMPREHENSIVE METABOLIC PANEL
ALBUMIN: 3.6 g/dL (ref 3.5–5.2)
ALT: 110 U/L — ABNORMAL HIGH (ref 0–35)
AST: 70 U/L — AB (ref 0–37)
Alkaline Phosphatase: 262 U/L — ABNORMAL HIGH (ref 39–117)
BUN: 14 mg/dL (ref 6–23)
CALCIUM: 9.2 mg/dL (ref 8.4–10.5)
CO2: 27 meq/L (ref 19–32)
CREATININE: 0.98 mg/dL (ref 0.40–1.20)
Chloride: 102 mEq/L (ref 96–112)
GFR: 57.35 mL/min — ABNORMAL LOW (ref >60.00)
Glucose, Bld: 90 mg/dL (ref 70–99)
POTASSIUM: 4.6 meq/L (ref 3.5–5.1)
SODIUM: 138 meq/L (ref 135–145)
Total Bilirubin: 1.4 mg/dL — ABNORMAL HIGH (ref 0.2–1.2)
Total Protein: 6.4 g/dL (ref 6.0–8.3)

## 2018-02-01 ENCOUNTER — Other Ambulatory Visit: Payer: Self-pay | Admitting: Family Medicine

## 2018-02-01 DIAGNOSIS — R748 Abnormal levels of other serum enzymes: Secondary | ICD-10-CM

## 2018-02-05 ENCOUNTER — Telehealth: Payer: Self-pay | Admitting: Family Medicine

## 2018-02-07 ENCOUNTER — Other Ambulatory Visit (INDEPENDENT_AMBULATORY_CARE_PROVIDER_SITE_OTHER): Payer: Medicare Other

## 2018-02-07 DIAGNOSIS — R748 Abnormal levels of other serum enzymes: Secondary | ICD-10-CM | POA: Diagnosis not present

## 2018-02-07 LAB — COMPREHENSIVE METABOLIC PANEL
ALBUMIN: 3.6 g/dL (ref 3.5–5.2)
ALK PHOS: 197 U/L — AB (ref 39–117)
ALT: 52 U/L — AB (ref 0–35)
AST: 42 U/L — ABNORMAL HIGH (ref 0–37)
BILIRUBIN TOTAL: 1 mg/dL (ref 0.2–1.2)
BUN: 14 mg/dL (ref 6–23)
CO2: 26 mEq/L (ref 19–32)
Calcium: 8.8 mg/dL (ref 8.4–10.5)
Chloride: 102 mEq/L (ref 96–112)
Creatinine, Ser: 0.95 mg/dL (ref 0.40–1.20)
GFR: 59.44 mL/min — ABNORMAL LOW (ref >60.00)
Glucose, Bld: 106 mg/dL — ABNORMAL HIGH (ref 70–99)
Potassium: 4.4 mEq/L (ref 3.5–5.1)
SODIUM: 137 meq/L (ref 135–145)
TOTAL PROTEIN: 6.3 g/dL (ref 6.0–8.3)

## 2018-02-07 LAB — CBC WITH DIFFERENTIAL/PLATELET
BASOS ABS: 0.1 10*3/uL (ref 0.0–0.1)
BASOS PCT: 1.1 % (ref 0.0–3.0)
Eosinophils Absolute: 0.1 10*3/uL (ref 0.0–0.7)
Eosinophils Relative: 2.2 % (ref 0.0–5.0)
HCT: 37.8 % (ref 36.0–46.0)
HEMOGLOBIN: 13 g/dL (ref 12.0–15.0)
LYMPHS ABS: 1.3 10*3/uL (ref 0.7–4.0)
LYMPHS PCT: 22.4 % (ref 12.0–46.0)
MCHC: 34.5 g/dL (ref 30.0–36.0)
MCV: 95.5 fl (ref 78.0–100.0)
MONO ABS: 0.5 10*3/uL (ref 0.1–1.0)
Monocytes Relative: 8.1 % (ref 3.0–12.0)
Neutro Abs: 3.8 10*3/uL (ref 1.4–7.7)
Neutrophils Relative %: 66.2 % (ref 43.0–77.0)
Platelets: 246 10*3/uL (ref 150.0–400.0)
RBC: 3.96 Mil/uL (ref 3.87–5.11)
RDW: 13.2 % (ref 11.5–15.5)
WBC: 5.7 10*3/uL (ref 4.0–10.5)

## 2018-02-08 ENCOUNTER — Other Ambulatory Visit: Payer: Self-pay | Admitting: Family Medicine

## 2018-02-08 DIAGNOSIS — R748 Abnormal levels of other serum enzymes: Secondary | ICD-10-CM

## 2018-02-08 LAB — LACTATE DEHYDROGENASE: LDH: 187 U/L (ref 120–250)

## 2018-02-10 MED ORDER — CITALOPRAM HYDROBROMIDE 20 MG PO TABS: ORAL_TABLET | ORAL | 1 refills | Status: DC

## 2018-02-10 NOTE — Addendum Note (Signed)
Addended by: Crissie SicklesARTER, Zac Torti A on: 02/10/2018 02:32 PM   Modules accepted: Orders

## 2018-02-10 NOTE — Telephone Encounter (Signed)
Patient sis calling to state she was seen for a physical on 01-05-18 and came back for labs on 01-30-18 and she is unsure why this medication was refused, for needing  Appt. The patient is completely out and she is requesting to have this medication filled.  Please advise

## 2018-02-10 NOTE — Telephone Encounter (Signed)
Medication sent in. 

## 2018-03-06 ENCOUNTER — Other Ambulatory Visit: Payer: Self-pay | Admitting: Family Medicine

## 2018-03-07 ENCOUNTER — Ambulatory Visit (HOSPITAL_BASED_OUTPATIENT_CLINIC_OR_DEPARTMENT_OTHER)
Admission: RE | Admit: 2018-03-07 | Discharge: 2018-03-07 | Disposition: A | Discharge status: Home / Self Care | Payer: Medicare Other | Source: Ambulatory Visit | Attending: Medical | Admitting: Medical

## 2018-03-07 ENCOUNTER — Ambulatory Visit (INDEPENDENT_AMBULATORY_CARE_PROVIDER_SITE_OTHER): Payer: Medicare Other | Admitting: Medical

## 2018-03-07 ENCOUNTER — Encounter: Payer: Self-pay | Admitting: Medical

## 2018-03-07 VITALS — BP 155/70 | HR 63 | Temp 97.9°F | Resp 16 | Ht 61.0 in | Wt 175.8 lb

## 2018-03-07 DIAGNOSIS — M79609 Pain in unspecified limb: Secondary | ICD-10-CM | POA: Insufficient documentation

## 2018-03-07 NOTE — Patient Instructions (Addendum)
You do have some recent pain in left hamstring area and popliteal region. Will get [place] of lower ext tonight at 8:30 pm.   If test is positive for dvt/clot then will let you know tonight. If negative then will let you know tomorrow morning.  Baker cyst also possible.   If no cause for pain found then would refer you to sports med.  Can continue tylenol for pain.  Check bp at home tonight and tomorrow. Want update on bp level. May need to adjust dose of bp medication. Some of increase may be from pain?   Follow up date to be determined after [place] review

## 2018-03-07 NOTE — Progress Notes (Signed)
Subjective:    Patient ID: Alyssa Williamson, female    DOB: 1931-07-01, 85 y.o.   MRN: [id number]  HPI  Pt in with some pain in back of her left hamstring and left popliteal area. No direct hip pain pain. No fall or trauma. Pain level for about 6 days.   Pt has tried tylenol for the pain.   Pain is noticed at test. Pt calf is not swollen. Pt is not short of breath.   Pt had pain in past before but would be transient and only last one day.    Review of Systems  Constitutional: Negative for chills, fatigue and fever.  Respiratory: Negative for cough, chest tightness, shortness of breath and wheezing.   Cardiovascular: Negative for chest pain and palpitations.  Musculoskeletal:       Left lower ext hamstring and popliteal region pain.  Skin: Negative for rash.  Hematological: Negative for adenopathy. Does not bruise/bleed easily.  Psychiatric/Behavioral: Negative for behavioral problems and confusion.   Past Medical History:  Diagnosis Date  . Allergic rhinitis   . Anemia   . Anxiety   . Arthritis    bilateral knee  . Atrial fibrillation (HCC) 12/21/2016  . Cervical cancer screening 10/30/2013   Menarche at 15 Regular and moderate flow No history of abnormal pap in past G4P3, s/p 3 svd and 1 MC No history of abnormal MGM Noconcerns today No gyn surgeries  . Constipation 11/18/2015  . Depression with anxiety   . Diverticulitis   . Heart murmur   . Hyperlipidemia   . Hypertension   . Hypothyroid   . Left knee DJD   . Medicare annual wellness visit, subsequent 10/30/2013  . PONV (postoperative nausea and vomiting)   . Preventative health care 05/20/2016     Social History   Socioeconomic History  . Marital status: Widowed    Spouse name: Not on file  . Number of children: 3  . Years of education: Not on file  . Highest education level: Not on file  Occupational History  . Not on file  Social Needs  . Financial resource strain: Not on file  . Food insecurity:    Worry:  Not on file    Inability: Not on file  . Transportation needs:    Medical: Not on file    Non-medical: Not on file  Tobacco Use  . Smoking status: Never Smoker  . Smokeless tobacco: Never Used  Substance and Sexual Activity  . Alcohol use: No  . Drug use: No  . Sexual activity: Not on file  Lifestyle  . Physical activity:    Days per week: Not on file    Minutes per session: Not on file  . Stress: Not on file  Relationships  . Social connections:    Talks on phone: Not on file    Gets together: Not on file    Attends religious service: Not on file    Active member of club or organization: Not on file    Attends meetings of clubs or organizations: Not on file    Relationship status: Not on file  . Intimate partner violence:    Fear of current or ex partner: Not on file    Emotionally abused: Not on file    Physically abused: Not on file    Forced sexual activity: Not on file  Other Topics Concern  . Not on file  Social History Narrative  . Not on file  Past Surgical History:  Procedure Laterality Date  . ABDOMINAL HYSTERECTOMY    . bladder tack  04/2010   Dr Marciano Sequin  . GANGLION CYST EXCISION    . JOINT REPLACEMENT Right   . REPLACEMENT TOTAL KNEE  10/2010   right knee-Murphy   . TONSILLECTOMY    . TOTAL KNEE ARTHROPLASTY Left 07/26/2012   Procedure: TOTAL KNEE ARTHROPLASTY;  Surgeon: Loreta Ave, MD;  Location: [facility] OR;  Service: Orthopedics;  Laterality: Left;    Family History  Problem Relation Age of Onset  . Alcohol abuse Father   . Breast cancer Sister   . Cancer Sister        bladder  . Glaucoma Sister   . Arthritis Daughter   . Diabetes Daughter        type 2  . Cancer Daughter        brain cancer, diagnosed 29 years.agp  . Mental illness Daughter        s/p craniotomies, gamma knife for tumors. numerous shunts.  . Heart failure Mother   . Cancer Brother        glioblastoma   . Glaucoma Brother   . Diverticulosis Sister   . Cancer  Sister        liver  . Cancer Brother        melanoma, mets to lung and brain and intestine  . Alcohol abuse Brother   . Cancer Brother        liver  . Glaucoma Maternal Grandfather   . Cancer Sister        breast  . Colon cancer Neg Hx   . Hypertension Neg Hx     Allergies  Allergen Reactions  . Cefdinir Dermatitis  . Codeine Nausea And Vomiting    Current Outpatient Medications on File Prior to Visit  Medication Sig Dispense Refill  . ALPRAZolam (XANAX) 0.25 MG tablet TAKE 1 TABLET(0.25 MG) BY MOUTH TWICE DAILY AS NEEDED FOR ANXIETY OR SLEEP 30 tablet 5  . apixaban (ELIQUIS) 5 MG TABS tablet TAKE 1 TABLET(5 MG) BY MOUTH TWICE DAILY 180 tablet 3  . atorvastatin (LIPITOR) 40 MG tablet TAKE 1 TABLET(40 MG) BY MOUTH DAILY 90 tablet 1  . ciprofloxacin (CIPRO) 250 MG tablet Take 1 tablet (250 mg total) by mouth 2 (two) times daily. 6 tablet 0  . citalopram (CELEXA) 20 MG tablet TAKE 1 TABLET(20 MG) BY MOUTH DAILY 90 tablet 1  . Cranberry 500 MG CAPS Take 500 mg by mouth daily.    Marland Kitchen levothyroxine (SYNTHROID, LEVOTHROID) 75 MCG tablet TAKE 1 TABLET(75 MCG) BY MOUTH DAILY 90 tablet 0  . loratadine (CLARITIN) 10 MG tablet Take 10 mg by mouth daily.    . Multiple Vitamins-Minerals (CENTRUM SILVER PO) Take 1 tablet by mouth daily.    Marland Kitchen PROVENTIL HFA 108 (90 Base) MCG/ACT inhaler INHALE 2 PUFFS INTO THE LUNGS EVERY 6 HOURS AS NEEDED FOR WHEEZING OR SHORTNESS OF BREATH 6.7 g 2  . verapamil (CALAN-SR) 240 MG CR tablet TAKE 1 TABLET BY MOUTH DAILY 30 tablet 11   No current facility-administered medications on file prior to visit.     BP (!) 163/47   Pulse 63   Temp 97.9 F (36.6 C) (Oral)   Resp 16   Ht 5\' 1"  (1.549 m)   Wt 175 lb 12.8 oz (79.7 kg)   SpO2 98%   BMI 33.22 kg/m       Objective:   Physical Exam  General-  No acute distress. Pleasant patient. Neck- Full range of motion, no jvd Lungs- Clear, even and unlabored. Heart- regular rate and rhythm. Neurologic- CNII-  XII grossly intact. Back - no mid lumbar pain on palpation. No left si pain but 5 days ago mild si pain. Left hip- no pain on rom or palpation. Left hamstring- mid aspect pain on palpatoin. Left knee- no pain on flexion or extension. Mild pain on palpation poplliteal fossae. Mild bulge Left calf- no swelling or pain on palpation.          Assessment & Plan:  You do have some recent pain in left hamstring area and popliteal region. Will get US of lower ext tonight at 8:30 pm.   If test is positive for dvt/clot then will let you know tonight. If negative then will let you know tomorrow morning.  Baker cyst also possible.   If no cause for pain found then would refer you to sports med.  Can continue tylenol for pain.  Check bp at home tonight and tomorrow. Want update on bp level. May need to adjust dose of bp medication. Some of increase may be from pain?   Follow up date to be determined after US review  Esperanza RichtersEdward Mishti Swanton, PA-C

## 2018-03-28 ENCOUNTER — Other Ambulatory Visit (INDEPENDENT_AMBULATORY_CARE_PROVIDER_SITE_OTHER): Payer: Medicare Other

## 2018-03-28 DIAGNOSIS — R748 Abnormal levels of other serum enzymes: Secondary | ICD-10-CM | POA: Diagnosis not present

## 2018-03-28 LAB — COMPREHENSIVE METABOLIC PANEL
ALT: 28 U/L (ref 0–35)
AST: 25 U/L (ref 0–37)
Albumin: 3.6 g/dL (ref 3.5–5.2)
Alkaline Phosphatase: 85 U/L (ref 39–117)
BILIRUBIN TOTAL: 0.8 mg/dL (ref 0.2–1.2)
BUN: 17 mg/dL (ref 6–23)
CO2: 27 meq/L (ref 19–32)
Calcium: 8.9 mg/dL (ref 8.4–10.5)
Chloride: 103 mEq/L (ref 96–112)
Creatinine, Ser: 1.1 mg/dL (ref 0.40–1.20)
GFR: 47.21 mL/min — ABNORMAL LOW (ref >60.00)
Glucose, Bld: 96 mg/dL (ref 70–99)
Potassium: 4.3 mEq/L (ref 3.5–5.1)
Sodium: 139 mEq/L (ref 135–145)
Total Protein: 6.3 g/dL (ref 6.0–8.3)

## 2018-05-01 ENCOUNTER — Other Ambulatory Visit: Payer: Self-pay | Admitting: Family Medicine

## 2018-06-27 ENCOUNTER — Other Ambulatory Visit: Payer: Self-pay | Admitting: Family Medicine

## 2018-06-27 DIAGNOSIS — I10 Essential (primary) hypertension: Secondary | ICD-10-CM

## 2018-06-29 NOTE — Telephone Encounter (Signed)
Requesting:xanax Contract:yes UDS:low risk next screen  Last OV:02/1418 Next OV:06/26/18 Last Refill:01/05/18  #30-05rf Database:   Please advise

## 2018-07-06 ENCOUNTER — Ambulatory Visit: Payer: Medicare Other | Admitting: Family Medicine

## 2018-07-27 ENCOUNTER — Other Ambulatory Visit: Payer: Self-pay | Admitting: Family Medicine

## 2018-07-27 DIAGNOSIS — I10 Essential (primary) hypertension: Secondary | ICD-10-CM

## 2018-07-28 NOTE — Telephone Encounter (Signed)
Requesting:xanax Contract:yes UDS:n/a Last OV:03/07/18 Next OV:08/18/18 Last Refill:06/29/18  #30-0rf Database:   Please advise

## 2018-08-04 ENCOUNTER — Other Ambulatory Visit: Payer: Self-pay | Admitting: Family Medicine

## 2018-08-16 ENCOUNTER — Telehealth: Payer: Self-pay | Admitting: *Deleted

## 2018-08-16 NOTE — Telephone Encounter (Signed)
A message was left, re: follow up visit. 

## 2018-08-18 ENCOUNTER — Other Ambulatory Visit: Payer: Self-pay

## 2018-08-18 ENCOUNTER — Ambulatory Visit (INDEPENDENT_AMBULATORY_CARE_PROVIDER_SITE_OTHER): Payer: Medicare Other | Admitting: Family Medicine

## 2018-08-18 DIAGNOSIS — I10 Essential (primary) hypertension: Secondary | ICD-10-CM

## 2018-08-18 DIAGNOSIS — R739 Hyperglycemia, unspecified: Secondary | ICD-10-CM | POA: Diagnosis not present

## 2018-08-18 DIAGNOSIS — E7800 Pure hypercholesterolemia, unspecified: Secondary | ICD-10-CM

## 2018-08-18 DIAGNOSIS — E039 Hypothyroidism, unspecified: Secondary | ICD-10-CM

## 2018-08-20 NOTE — Assessment & Plan Note (Signed)
hgba1c acceptable, minimize simple carbs. Increase exercise as tolerated.  

## 2018-08-20 NOTE — Assessment & Plan Note (Signed)
Encouraged to check vitals weekly and report any concerns.  no changes to meds. Encouraged heart healthy diet such as the DASH diet and exercise as tolerated.  

## 2018-08-20 NOTE — Progress Notes (Signed)
Virtual Visit via Phone Note  I connected with Alyssa Williamson on 08/18/18 at 10:40 AM EDT by a phone enabled telemedicine application and verified that I am speaking with the correct person using two identifiers.  Location: Patient: home Provider: home   I discussed the limitations of evaluation and management by telemedicine and the availability of in person appointments. The patient expressed understanding and agreed to proceed. Crissie SicklesPrincess Carter CMA tried to get patient set up with video visit and was unsuccessful so visit was completed via telephone    Subjective:    Patient ID: Alyssa Williamson, female    DOB: 1931-09-10, 85 y.o.   MRN: [id number]  No chief complaint on file.   HPI Patient is in today for follow up on chronic medical concerns including hypertension, hyperlipidemia, hyperglycemia and thyroid disease. She feels well today. No recent febrile illness although she did have a URI about 2 months ago with chills but symptoms have resolved. She is quarantining most of the time although she has had some contact with her church community. She has been wearing a mask when she sees them. Denies CP/palp/SOB/HA/congestion/fevers/GI or GU c/o. Taking meds as prescribed  Past Medical History:  Diagnosis Date  . Allergic rhinitis   . Anemia   . Anxiety   . Arthritis    bilateral knee  . Atrial fibrillation (HCC) 12/21/2016  . Cervical cancer screening 10/30/2013   Menarche at 15 Regular and moderate flow No history of abnormal pap in past G4P3, s/p 3 svd and 1 MC No history of abnormal MGM Noconcerns today No gyn surgeries  . Constipation 11/18/2015  . Depression with anxiety   . Diverticulitis   . Heart murmur   . Hyperlipidemia   . Hypertension   . Hypothyroid   . Left knee DJD   . Medicare annual wellness visit, subsequent 10/30/2013  . PONV (postoperative nausea and vomiting)   . Preventative health care 05/20/2016    Past Surgical History:  Procedure Laterality Date  .  ABDOMINAL HYSTERECTOMY    . bladder tack  04/2010   Dr Marciano Sequinimothy  [place]  . GANGLION CYST EXCISION    . JOINT REPLACEMENT Right   . REPLACEMENT TOTAL KNEE  10/2010   right knee-Murphy   . TONSILLECTOMY    . TOTAL KNEE ARTHROPLASTY Left 07/26/2012   Procedure: TOTAL KNEE ARTHROPLASTY;  Surgeon: Loreta Aveaniel F Murphy, MD;  Location: [facility] OR;  Service: Orthopedics;  Laterality: Left;    Family History  Problem Relation Age of Onset  . Alcohol abuse Father   . Breast cancer Sister   . Cancer Sister        bladder  . Glaucoma Sister   . Arthritis Daughter   . Diabetes Daughter        type 2  . Cancer Daughter        brain cancer, diagnosed 29 years.agp  . Mental illness Daughter        s/p craniotomies, gamma knife for tumors. numerous shunts.  . Heart failure Mother   . Cancer Brother        glioblastoma   . Glaucoma Brother   . Diverticulosis Sister   . Cancer Sister        liver  . Cancer Brother        melanoma, mets to lung and brain and intestine  . Alcohol abuse Brother   . Cancer Brother        liver  . Glaucoma Maternal Grandfather   .  Cancer Sister        breast  . Colon cancer Neg Hx   . Hypertension Neg Hx     Social History   Socioeconomic History  . Marital status: Widowed    Spouse name: Not on file  . Number of children: 3  . Years of education: Not on file  . Highest education level: Not on file  Occupational History  . Not on file  Social Needs  . Financial resource strain: Not on file  . Food insecurity    Worry: Not on file    Inability: Not on file  . Transportation needs    Medical: Not on file    Non-medical: Not on file  Tobacco Use  . Smoking status: Never Smoker  . Smokeless tobacco: Never Used  Substance and Sexual Activity  . Alcohol use: No  . Drug use: No  . Sexual activity: Not on file  Lifestyle  . Physical activity    Days per week: Not on file    Minutes per session: Not on file  . Stress: Not on file  Relationships  . Social  Herbalist on phone: Not on file    Gets together: Not on file    Attends religious service: Not on file    Active member of club or organization: Not on file    Attends meetings of clubs or organizations: Not on file    Relationship status: Not on file  . Intimate partner violence    Fear of current or ex partner: Not on file    Emotionally abused: Not on file    Physically abused: Not on file    Forced sexual activity: Not on file  Other Topics Concern  . Not on file  Social History Narrative  . Not on file    Outpatient Medications Prior to Visit  Medication Sig Dispense Refill  . ALPRAZolam (XANAX) 0.25 MG tablet TAKE 1 TABLET(0.25 MG) BY MOUTH TWICE DAILY AS NEEDED FOR ANXIETY OR SLEEP 30 tablet 0  . apixaban (ELIQUIS) 5 MG TABS tablet TAKE 1 TABLET(5 MG) BY MOUTH TWICE DAILY 180 tablet 3  . atorvastatin (LIPITOR) 40 MG tablet TAKE 1 TABLET(40 MG) BY MOUTH DAILY 90 tablet 1  . ciprofloxacin (CIPRO) 250 MG tablet Take 1 tablet (250 mg total) by mouth 2 (two) times daily. 6 tablet 0  . citalopram (CELEXA) 20 MG tablet TAKE 1 TABLET(20 MG) BY MOUTH DAILY 90 tablet 1  . Cranberry 500 MG CAPS Take 500 mg by mouth daily.    Marland Kitchen levothyroxine (SYNTHROID) 75 MCG tablet TAKE 1 TABLET(75 MCG) BY MOUTH DAILY 90 tablet 0  . loratadine (CLARITIN) 10 MG tablet Take 10 mg by mouth daily.    . Multiple Vitamins-Minerals (CENTRUM SILVER PO) Take 1 tablet by mouth daily.    Marland Kitchen PROVENTIL HFA 108 (90 Base) MCG/ACT inhaler INHALE 2 PUFFS INTO THE LUNGS EVERY 6 HOURS AS NEEDED FOR WHEEZING OR SHORTNESS OF BREATH 6.7 g 2  . verapamil (CALAN-SR) 240 MG CR tablet TAKE 1 TABLET BY MOUTH DAILY 30 tablet 11   No facility-administered medications prior to visit.     Allergies  Allergen Reactions  . Cefdinir Dermatitis  . Codeine Nausea And Vomiting    Review of Systems  Constitutional: Negative for fever and malaise/fatigue.  HENT: Negative for congestion.   Eyes: Negative for blurred  vision.  Respiratory: Negative for shortness of breath.   Cardiovascular: Negative for chest pain, palpitations  and leg swelling.  Gastrointestinal: Negative for abdominal pain, blood in stool and nausea.  Genitourinary: Negative for dysuria and frequency.  Musculoskeletal: Negative for falls.  Skin: Negative for rash.  Neurological: Negative for dizziness, loss of consciousness and headaches.  Endo/Heme/Allergies: Negative for environmental allergies.  Psychiatric/Behavioral: Negative for depression. The patient is not nervous/anxious.        Objective:    Physical Exam  Unable to obtain via phone visit There were no vitals taken for this visit. Wt Readings from Last 3 Encounters:  03/07/18 175 lb 12.8 oz (79.7 kg)  01/05/18 177 lb (80.3 kg)  10/05/17 177 lb 12.8 oz (80.6 kg)    Diabetic Foot Exam - Simple   No data filed     Lab Results  Component Value Date   WBC 5.7 02/07/2018   HGB 13.0 02/07/2018   HCT 37.8 02/07/2018   PLT 246.0 02/07/2018   GLUCOSE 96 03/28/2018   CHOL 165 01/05/2018   TRIG 128.0 01/05/2018   HDL 63.20 01/05/2018   LDLCALC 77 01/05/2018   ALT 28 03/28/2018   AST 25 03/28/2018   NA 139 03/28/2018   K 4.3 03/28/2018   CL 103 03/28/2018   CREATININE 1.10 03/28/2018   BUN 17 03/28/2018   CO2 27 03/28/2018   TSH 0.50 01/05/2018   INR 0.99 07/24/2012   HGBA1C 5.6 01/05/2018    Lab Results  Component Value Date   TSH 0.50 01/05/2018   Lab Results  Component Value Date   WBC 5.7 02/07/2018   HGB 13.0 02/07/2018   HCT 37.8 02/07/2018   MCV 95.5 02/07/2018   PLT 246.0 02/07/2018   Lab Results  Component Value Date   NA 139 03/28/2018   K 4.3 03/28/2018   CO2 27 03/28/2018   GLUCOSE 96 03/28/2018   BUN 17 03/28/2018   CREATININE 1.10 03/28/2018   BILITOT 0.8 03/28/2018   ALKPHOS 85 03/28/2018   AST 25 03/28/2018   ALT 28 03/28/2018   PROT 6.3 03/28/2018   ALBUMIN 3.6 03/28/2018   CALCIUM 8.9 03/28/2018   ANIONGAP 8 11/08/2016    GFR 47.21 (L) 03/28/2018   Lab Results  Component Value Date   CHOL 165 01/05/2018   Lab Results  Component Value Date   HDL 63.20 01/05/2018   Lab Results  Component Value Date   LDLCALC 77 01/05/2018   Lab Results  Component Value Date   TRIG 128.0 01/05/2018   Lab Results  Component Value Date   CHOLHDL 3 01/05/2018   Lab Results  Component Value Date   HGBA1C 5.6 01/05/2018       Assessment & Plan:   Problem List Items Addressed This Visit    Hypothyroidism    On Levothyroxine, continue to monitor      HTN (hypertension)    Encouraged to check vitals weekly and report any concerns. no changes to meds. Encouraged heart healthy diet such as the DASH diet and exercise as tolerated.       Hyperlipidemia    Encouraged heart healthy diet, increase exercise, avoid trans fats, consider a krill oil cap daily      Hyperglycemia    hgba1c acceptable, minimize simple carbs. Increase exercise as tolerated.          I am having Alyssa Williamson maintain her Multiple Vitamins-Minerals (CENTRUM SILVER PO), Cranberry, Proventil HFA, loratadine, apixaban, verapamil, ciprofloxacin, citalopram, atorvastatin, ALPRAZolam, and levothyroxine.  No orders of the defined types were placed in this encounter.  I discussed the assessment and treatment plan with the patient. The patient was provided an opportunity to ask questions and all were answered. The patient agreed with the plan and demonstrated an understanding of the instructions.   The patient was advised to call back or seek an in-person evaluation if the symptoms worsen or if the condition fails to improve as anticipated.  I provided 25 minutes of non-face-to-face time during this encounter.   Penni Homans, MD

## 2018-08-20 NOTE — Assessment & Plan Note (Signed)
On Levothyroxine, continue to monitor 

## 2018-08-20 NOTE — Assessment & Plan Note (Signed)
Encouraged heart healthy diet, increase exercise, avoid trans fats, consider a krill oil cap daily 

## 2018-08-23 ENCOUNTER — Other Ambulatory Visit: Payer: Self-pay | Admitting: Family Medicine

## 2018-08-23 DIAGNOSIS — I10 Essential (primary) hypertension: Secondary | ICD-10-CM

## 2018-08-23 NOTE — Telephone Encounter (Signed)
Requesting:xanax Contract:yes UDS:low risk next screen 12/21/17 Last OV:03/07/18 Next OV:n/a Last Refill:07/28/18  #30-0rf Database:   Please advise

## 2018-09-06 ENCOUNTER — Other Ambulatory Visit: Payer: Self-pay | Admitting: Family Medicine

## 2018-09-22 ENCOUNTER — Other Ambulatory Visit: Payer: Self-pay | Admitting: Family Medicine

## 2018-09-22 DIAGNOSIS — I10 Essential (primary) hypertension: Secondary | ICD-10-CM

## 2018-09-22 NOTE — Telephone Encounter (Signed)
Requesting: xanax Contract:yes KSK:SHNGI one  Last OV:08/18/18 Next OV:n/a Last Refill:08/23/18 #30-0rf Database:   Please advise

## 2018-10-19 ENCOUNTER — Other Ambulatory Visit: Payer: Self-pay | Admitting: Cardiology

## 2018-10-19 MED ORDER — APIXABAN 5 MG PO TABS: ORAL_TABLET | ORAL | 0 refills | Status: DC

## 2018-10-19 NOTE — Telephone Encounter (Signed)
 *  STAT* If patient is at the pharmacy, call can be transferred to refill team.   1. Which medications need to be refilled? (please list name of each medication and dose if known) apixaban (ELIQUIS) 5 MG TABS tablet  2. Which pharmacy/location (including street and city if local pharmacy) is medication to be sent to? [facility], [address], [place]  3. Do they need a 30 day or 90 day supply? [place]

## 2018-10-19 NOTE — Telephone Encounter (Signed)
72f 79.7kg Scr 1.10 03/28/18 Lovw/[place] 01/19/17

## 2018-10-20 ENCOUNTER — Other Ambulatory Visit: Payer: Self-pay | Admitting: Family Medicine

## 2018-10-20 DIAGNOSIS — I10 Essential (primary) hypertension: Secondary | ICD-10-CM

## 2018-10-23 NOTE — Telephone Encounter (Signed)
Requesting:xanax Contract:yes UDS:n/a Last OV:08/18/18 Next OV:n/a Last Refill:09/22/18 #30-0rf Database:   Please advise

## 2018-11-04 ENCOUNTER — Other Ambulatory Visit: Payer: Self-pay | Admitting: Family Medicine

## 2018-11-08 ENCOUNTER — Telehealth: Payer: Self-pay | Admitting: *Deleted

## 2018-11-08 DIAGNOSIS — E039 Hypothyroidism, unspecified: Secondary | ICD-10-CM

## 2018-11-08 DIAGNOSIS — E785 Hyperlipidemia, unspecified: Secondary | ICD-10-CM

## 2018-11-08 DIAGNOSIS — I10 Essential (primary) hypertension: Secondary | ICD-10-CM

## 2018-11-08 NOTE — Telephone Encounter (Signed)
Appointment scheduled for labs on 11/13/18 and will get nv schedule open for her to get flu shot.   Copied from [place] [phone]. Topic: General - Other >> Nov 06, 2018 10:15 AM Alyssa Williamson wrote: Reason for CRM: Patient believes she needs to get blood work done.  There are no future orders. Patient would like to get done when she comes in for her flu shot on Friday 9/18. Call back [phone]

## 2018-11-10 ENCOUNTER — Ambulatory Visit: Payer: Medicare Other

## 2018-11-11 NOTE — Progress Notes (Signed)
HPI: FUatrial fibrillation.Nuclear study May 2014 showed breast attenuation but no ischemia. Ejection fraction 79%.Echocardiogram September 2018 showed normal LV function, mild mitral regurgitation and mild biatrial enlargement.Noted to be in new onset atrial 9/18. Seen in atrial fibrillation clinic; treated with rate control and anticoagulation. TSH normal. Since last seen,the patient denies any dyspnea on exertion, orthopnea, PND, pedal edema, palpitations, syncope or chest pain.   Current Outpatient Medications  Medication Sig Dispense Refill  . ALPRAZolam (XANAX) 0.25 MG tablet TAKE 1 TABLET(0.25 MG) BY MOUTH TWICE DAILY AS NEEDED FOR ANXIETY OR SLEEP 30 tablet 2  . apixaban (ELIQUIS) 5 MG TABS tablet TAKE 1 TABLET(5 MG) BY MOUTH TWICE DAILY 60 tablet 0  . atorvastatin (LIPITOR) 40 MG tablet TAKE 1 TABLET(40 MG) BY MOUTH DAILY 90 tablet 1  . citalopram (CELEXA) 20 MG tablet TAKE 1 TABLET(20 MG) BY MOUTH DAILY 90 tablet 1  . Cranberry 500 MG CAPS Take 500 mg by mouth daily.    Marland Kitchen levothyroxine (SYNTHROID) 75 MCG tablet TAKE 1 TABLET(75 MCG) BY MOUTH DAILY 90 tablet 1  . loratadine (CLARITIN) 10 MG tablet Take 10 mg by mouth daily.    . Multiple Vitamins-Minerals (CENTRUM SILVER PO) Take 1 tablet by mouth daily.    Marland Kitchen PROVENTIL HFA 108 (90 Base) MCG/ACT inhaler INHALE 2 PUFFS INTO THE LUNGS EVERY 6 HOURS AS NEEDED FOR WHEEZING OR SHORTNESS OF BREATH 6.7 g 2  . verapamil (CALAN-SR) 240 MG CR tablet TAKE 1 TABLET BY MOUTH DAILY 30 tablet 0   No current facility-administered medications for this visit.      Past Medical History:  Diagnosis Date  . Allergic rhinitis   . Anemia   . Anxiety   . Arthritis    bilateral knee  . Atrial fibrillation ([place]) 12/21/2016  . Cervical cancer screening 10/30/2013   Menarche at 15 Regular and moderate flow No history of abnormal pap in past G4P3, s/p 3 svd and 1 MC No history of abnormal MGM Noconcerns today No gyn surgeries  . Constipation  11/18/2015  . Depression with anxiety   . Diverticulitis   . Heart murmur   . Hyperlipidemia   . Hypertension   . Hypothyroid   . Left knee DJD   . Medicare annual wellness visit, subsequent 10/30/2013  . PONV (postoperative nausea and vomiting)   . Preventative health care 05/20/2016    Past Surgical History:  Procedure Laterality Date  . ABDOMINAL HYSTERECTOMY    . bladder tack  04/2010   Dr Dinah Beers  . GANGLION CYST EXCISION    . JOINT REPLACEMENT Right   . REPLACEMENT TOTAL KNEE  10/2010   right knee-Murphy   . TONSILLECTOMY    . TOTAL KNEE ARTHROPLASTY Left 07/26/2012   Procedure: TOTAL KNEE ARTHROPLASTY;  Surgeon: Ninetta Lights, MD;  Location: [place];  Service: Orthopedics;  Laterality: Left;    Social History   Socioeconomic History  . Marital status: Widowed    Spouse name: Not on file  . Number of children: 3  . Years of education: Not on file  . Highest education level: Not on file  Occupational History  . Not on file  Social Needs  . Financial resource strain: Not on file  . Food insecurity    Worry: Not on file    Inability: Not on file  . Transportation needs    Medical: Not on file    Non-medical: Not on file  Tobacco Use  .  Smoking status: Never Smoker  . Smokeless tobacco: Never Used  Substance and Sexual Activity  . Alcohol use: No  . Drug use: No  . Sexual activity: Not on file  Lifestyle  . Physical activity    Days per week: Not on file    Minutes per session: Not on file  . Stress: Not on file  Relationships  . Social Musicianconnections    Talks on phone: Not on file    Gets together: Not on file    Attends religious service: Not on file    Active member of club or organization: Not on file    Attends meetings of clubs or organizations: Not on file    Relationship status: Not on file  . Intimate partner violence    Fear of current or ex partner: Not on file    Emotionally abused: Not on file    Physically abused: Not on file    Forced  sexual activity: Not on file  Other Topics Concern  . Not on file  Social History Narrative  . Not on file    Family History  Problem Relation Age of Onset  . Alcohol abuse Father   . Breast cancer Sister   . Cancer Sister        bladder  . Glaucoma Sister   . Arthritis Daughter   . Diabetes Daughter        type 2  . Cancer Daughter        brain cancer, diagnosed 29 years.agp  . Mental illness Daughter        s/p craniotomies, gamma knife for tumors. numerous shunts.  . Heart failure Mother   . Cancer Brother        glioblastoma   . Glaucoma Brother   . Diverticulosis Sister   . Cancer Sister        liver  . Cancer Brother        melanoma, mets to lung and brain and intestine  . Alcohol abuse Brother   . Cancer Brother        liver  . Glaucoma Maternal Grandfather   . Cancer Sister        breast  . Colon cancer Neg Hx   . Hypertension Neg Hx     ROS: no fevers or chills, productive cough, hemoptysis, dysphasia, odynophagia, melena, hematochezia, dysuria, hematuria, rash, seizure activity, orthopnea, PND, pedal edema, claudication. Remaining systems are negative.  Physical Exam: Well-developed well-nourished in no acute distress.  Skin is warm and dry.  HEENT is normal.  Neck is supple.  Chest is clear to auscultation with normal expansion.  Cardiovascular exam is regular rate and rhythm.  Abdominal exam nontender or distended. No masses palpated. Extremities show no edema. neuro grossly intact  ECG-sinus rhythm at a rate of 65, nonspecific ST changes.  Personally reviewed  A/P  1 paroxysmal atrial fibrillation-patient remains in sinus rhythm.  Continue present dose of verapamil and apixaban. Recent laboratories personally reviewed from September 21.  Creatinine 1.18.  Check hemoglobin.  2 hypertension-blood pressure controlled.  Continue present medications and follow.  Laboratories from September 21 personally reviewed and potassium 4.6 and sodium 138.  3  hyperlipidemia-continue statin.  Laboratories from September 21 personally reviewed.  Liver functions normal.  Total cholesterol 147 with LDL 69.  Olga MillersBrian Mendel Binsfeld, MD

## 2018-11-13 ENCOUNTER — Ambulatory Visit (INDEPENDENT_AMBULATORY_CARE_PROVIDER_SITE_OTHER): Payer: Medicare Other | Admitting: *Deleted

## 2018-11-13 ENCOUNTER — Other Ambulatory Visit (INDEPENDENT_AMBULATORY_CARE_PROVIDER_SITE_OTHER): Payer: Medicare Other

## 2018-11-13 ENCOUNTER — Other Ambulatory Visit: Payer: Self-pay

## 2018-11-13 DIAGNOSIS — E039 Hypothyroidism, unspecified: Secondary | ICD-10-CM

## 2018-11-13 DIAGNOSIS — I10 Essential (primary) hypertension: Secondary | ICD-10-CM

## 2018-11-13 DIAGNOSIS — Z23 Encounter for immunization: Secondary | ICD-10-CM

## 2018-11-13 DIAGNOSIS — E785 Hyperlipidemia, unspecified: Secondary | ICD-10-CM

## 2018-11-13 NOTE — Progress Notes (Signed)
Patient here today for flu vaccine.  Vaccine given and patient tolerated well. 

## 2018-11-14 LAB — LIPID PANEL
Cholesterol: 147 mg/dL (ref 0–200)
HDL: 60 mg/dL (ref >39.00)
LDL Cholesterol: 69 mg/dL (ref 0–99)
NonHDL: 86.72
Total CHOL/HDL Ratio: 2
Triglycerides: 89 mg/dL (ref 0.0–149.0)
VLDL: 17.8 mg/dL (ref 0.0–40.0)

## 2018-11-14 LAB — COMPREHENSIVE METABOLIC PANEL
ALT: 18 U/L (ref 0–35)
AST: 22 U/L (ref 0–37)
Albumin: 3.7 g/dL (ref 3.5–5.2)
Alkaline Phosphatase: 74 U/L (ref 39–117)
BUN: 22 mg/dL (ref 6–23)
CO2: 27 mEq/L (ref 19–32)
Calcium: 9.1 mg/dL (ref 8.4–10.5)
Chloride: 102 mEq/L (ref 96–112)
Creatinine, Ser: 1.18 mg/dL (ref 0.40–1.20)
GFR: 43.47 mL/min — ABNORMAL LOW (ref >60.00)
Glucose, Bld: 116 mg/dL — ABNORMAL HIGH (ref 70–99)
Potassium: 4.6 mEq/L (ref 3.5–5.1)
Sodium: 138 mEq/L (ref 135–145)
Total Bilirubin: 0.7 mg/dL (ref 0.2–1.2)
Total Protein: 6.2 g/dL (ref 6.0–8.3)

## 2018-11-14 LAB — TSH: TSH: 0.27 u[IU]/mL — ABNORMAL LOW (ref 0.35–4.50)

## 2018-11-15 ENCOUNTER — Other Ambulatory Visit: Payer: Self-pay

## 2018-11-15 ENCOUNTER — Other Ambulatory Visit (INDEPENDENT_AMBULATORY_CARE_PROVIDER_SITE_OTHER): Payer: Medicare Other

## 2018-11-15 ENCOUNTER — Ambulatory Visit: Payer: Medicare Other

## 2018-11-15 ENCOUNTER — Ambulatory Visit (INDEPENDENT_AMBULATORY_CARE_PROVIDER_SITE_OTHER): Payer: Medicare Other | Admitting: Cardiology

## 2018-11-15 ENCOUNTER — Encounter: Payer: Self-pay | Admitting: Cardiology

## 2018-11-15 VITALS — BP 148/72 | HR 65 | Ht 61.0 in | Wt 179.8 lb

## 2018-11-15 DIAGNOSIS — E7800 Pure hypercholesterolemia, unspecified: Secondary | ICD-10-CM

## 2018-11-15 DIAGNOSIS — I10 Essential (primary) hypertension: Secondary | ICD-10-CM | POA: Diagnosis not present

## 2018-11-15 DIAGNOSIS — E039 Hypothyroidism, unspecified: Secondary | ICD-10-CM | POA: Diagnosis not present

## 2018-11-15 DIAGNOSIS — I480 Paroxysmal atrial fibrillation: Secondary | ICD-10-CM

## 2018-11-15 LAB — T4, FREE: Free T4: 1.02 ng/dL (ref 0.60–1.60)

## 2018-11-15 NOTE — Patient Instructions (Signed)
Medication Instructions:  NO CHANGE If you need a refill on your cardiac medications before your next appointment, please call your pharmacy.   Lab work: Your physician recommends that you HAVE LAB WORK TODAY If you have labs (blood work) drawn today and your tests are completely normal, you will receive your results only by: . MyChart Message (if you have MyChart) OR . A paper copy in the mail If you have any lab test that is abnormal or we need to change your treatment, we will call you to review the results.  Follow-Up: At [facility], you and your health needs are our priority.  As part of our continuing mission to provide you with exceptional heart care, we have created designated Provider Care Teams.  These Care Teams include your primary Cardiologist (physician) and Advanced Practice Providers (APPs -  Physician Assistants and Nurse Practitioners) who all work together to provide you with the care you need, when you need it. Your physician wants you to follow-up in: ONE YEAR WITH DR CRENSHAW You will receive a reminder letter in the mail two months in advance. If you don't receive a letter, please call our office to schedule the follow-up appointment.     

## 2018-11-16 ENCOUNTER — Encounter: Payer: Self-pay | Admitting: *Deleted

## 2018-11-16 LAB — CBC
Hematocrit: 41.3 % (ref 34.0–46.6)
Hemoglobin: 13.7 g/dL (ref 11.1–15.9)
MCH: 32 pg (ref 26.6–33.0)
MCHC: 33.2 g/dL (ref 31.5–35.7)
MCV: 97 fL (ref 79–97)
Platelets: 190 10*3/uL (ref 150–450)
RBC: 4.28 x10E6/uL (ref 3.77–5.28)
RDW: 12.8 % (ref 11.7–15.4)
WBC: 9.7 10*3/uL (ref 3.4–10.8)

## 2018-11-20 ENCOUNTER — Other Ambulatory Visit: Payer: Self-pay | Admitting: Cardiology

## 2018-11-20 MED ORDER — APIXABAN 5 MG PO TABS: ORAL_TABLET | ORAL | 1 refills | Status: DC

## 2018-11-20 NOTE — Telephone Encounter (Signed)
° ° ° ° °*  STAT* If patient is at the pharmacy, call can be transferred to refill team.   1. Which medications need to be refilled? (please list name of each medication and dose if known) apixaban (ELIQUIS) 5 MG TABS tablet 2. Which pharmacy/location (including street and city if local pharmacy) is medication to be sent to? [id number] - [place], [place] - [address]  3. Do they need a 30 day or 90 day supply? [place]

## 2018-11-21 ENCOUNTER — Other Ambulatory Visit: Payer: Self-pay

## 2018-11-21 MED ORDER — APIXABAN 5 MG PO TABS: ORAL_TABLET | ORAL | 1 refills | Status: DC

## 2018-11-22 ENCOUNTER — Other Ambulatory Visit: Payer: Self-pay

## 2018-11-22 MED ORDER — VERAPAMIL HCL ER 240 MG PO TBCR: 240.0000 mg | EXTENDED_RELEASE_TABLET | Freq: Every day | ORAL | 3 refills | Status: DC

## 2018-11-23 ENCOUNTER — Telehealth: Payer: Self-pay | Admitting: Cardiology

## 2018-11-23 NOTE — Telephone Encounter (Signed)
Called and lmomed the pt regarding changing their eliquis to a different pharmacy but there was no answer

## 2018-11-23 NOTE — Telephone Encounter (Signed)
Pt calling stating that she would like a call back to change her pharmacy for Eliquis, to be able to get her medication cheaper. Call back [phone]. Please address

## 2019-01-14 ENCOUNTER — Other Ambulatory Visit: Payer: Self-pay | Admitting: Family Medicine

## 2019-01-14 DIAGNOSIS — I10 Essential (primary) hypertension: Secondary | ICD-10-CM

## 2019-01-15 NOTE — Telephone Encounter (Signed)
She needs a visit by next month to get any further refills. Did refill today

## 2019-01-15 NOTE — Telephone Encounter (Signed)
Requesting:xanax Contract:yes UDS:n/a Last OV:08/18/18 Next OV:n/a Last Refill:10/23/18 #30-2rf Database:   Please advise

## 2019-02-12 ENCOUNTER — Other Ambulatory Visit: Payer: Self-pay | Admitting: Family Medicine

## 2019-02-12 DIAGNOSIS — I10 Essential (primary) hypertension: Secondary | ICD-10-CM

## 2019-02-12 NOTE — Telephone Encounter (Signed)
Requesting:xanax Contract:no UDS:n/a Last OV:08/18/18 Next OV:n/a Last Refill:01/15/19  #30-0rf Database:   Please advise

## 2019-02-13 ENCOUNTER — Other Ambulatory Visit: Payer: Self-pay | Admitting: Family Medicine

## 2019-02-13 DIAGNOSIS — I10 Essential (primary) hypertension: Secondary | ICD-10-CM

## 2019-02-13 MED ORDER — ALPRAZOLAM 0.25 MG PO TABS: 0.2500 mg | ORAL_TABLET | Freq: Every day | ORAL | 0 refills | Status: DC | PRN

## 2019-02-13 NOTE — Telephone Encounter (Signed)
Please schedule patient visit with PCP can be vv

## 2019-02-13 NOTE — Telephone Encounter (Signed)
Needs appt for any further refills after this one

## 2019-02-13 NOTE — Telephone Encounter (Signed)
LM to schedule virtual visit with pt before additional refills.

## 2019-02-14 ENCOUNTER — Other Ambulatory Visit: Payer: Self-pay | Admitting: Family Medicine

## 2019-02-14 NOTE — Telephone Encounter (Signed)
We are trying to get patient scheduled  Can you approve medication  Please advise

## 2019-02-14 NOTE — Telephone Encounter (Signed)
I already sent it to [facility] on 02/13/19

## 2019-03-09 ENCOUNTER — Other Ambulatory Visit: Payer: Self-pay | Admitting: Family Medicine

## 2019-03-15 ENCOUNTER — Other Ambulatory Visit: Payer: Self-pay | Admitting: Family Medicine

## 2019-03-15 DIAGNOSIS — I10 Essential (primary) hypertension: Secondary | ICD-10-CM

## 2019-03-15 NOTE — Telephone Encounter (Signed)
Requesting:xanax Contract:no UDS:no Last OV:08/18/18 Next OV:03/19/19 Last Refill:02/13/20  #30-0rf Database:   Please advise

## 2019-03-16 ENCOUNTER — Other Ambulatory Visit: Payer: Self-pay

## 2019-03-19 ENCOUNTER — Other Ambulatory Visit: Payer: Self-pay

## 2019-03-19 ENCOUNTER — Ambulatory Visit (INDEPENDENT_AMBULATORY_CARE_PROVIDER_SITE_OTHER): Payer: Medicare Other | Admitting: Family Medicine

## 2019-03-19 ENCOUNTER — Encounter: Payer: Self-pay | Admitting: Family Medicine

## 2019-03-19 VITALS — BP 124/60 | HR 60 | Temp 97.8°F | Resp 18 | Wt 143.0 lb

## 2019-03-19 DIAGNOSIS — R739 Hyperglycemia, unspecified: Secondary | ICD-10-CM

## 2019-03-19 DIAGNOSIS — I10 Essential (primary) hypertension: Secondary | ICD-10-CM | POA: Diagnosis not present

## 2019-03-19 DIAGNOSIS — I4891 Unspecified atrial fibrillation: Secondary | ICD-10-CM

## 2019-03-19 DIAGNOSIS — E039 Hypothyroidism, unspecified: Secondary | ICD-10-CM

## 2019-03-19 DIAGNOSIS — E7800 Pure hypercholesterolemia, unspecified: Secondary | ICD-10-CM

## 2019-03-19 LAB — COMPREHENSIVE METABOLIC PANEL
ALT: 19 U/L (ref 0–35)
AST: 21 U/L (ref 0–37)
Albumin: 3.7 g/dL (ref 3.5–5.2)
Alkaline Phosphatase: 85 U/L (ref 39–117)
BUN: 19 mg/dL (ref 6–23)
CO2: 29 mEq/L (ref 19–32)
Calcium: 8.8 mg/dL (ref 8.4–10.5)
Chloride: 101 mEq/L (ref 96–112)
Creatinine, Ser: 1.1 mg/dL (ref 0.40–1.20)
GFR: 47.1 mL/min — ABNORMAL LOW (ref >60.00)
Glucose, Bld: 85 mg/dL (ref 70–99)
Potassium: 4.7 mEq/L (ref 3.5–5.1)
Sodium: 137 mEq/L (ref 135–145)
Total Bilirubin: 0.9 mg/dL (ref 0.2–1.2)
Total Protein: 6.2 g/dL (ref 6.0–8.3)

## 2019-03-19 LAB — CBC
HCT: 41 % (ref 36.0–46.0)
Hemoglobin: 13.8 g/dL (ref 12.0–15.0)
MCHC: 33.5 g/dL (ref 30.0–36.0)
MCV: 98.1 fl (ref 78.0–100.0)
Platelets: 196 10*3/uL (ref 150.0–400.0)
RBC: 4.19 Mil/uL (ref 3.87–5.11)
RDW: 13.1 % (ref 11.5–15.5)
WBC: 6.4 10*3/uL (ref 4.0–10.5)

## 2019-03-19 LAB — LIPID PANEL
Cholesterol: 149 mg/dL (ref 0–200)
HDL: 60.6 mg/dL (ref >39.00)
LDL Cholesterol: 74 mg/dL (ref 0–99)
NonHDL: 88.52
Total CHOL/HDL Ratio: 2
Triglycerides: 72 mg/dL (ref 0.0–149.0)
VLDL: 14.4 mg/dL (ref 0.0–40.0)

## 2019-03-19 LAB — T4, FREE: Free T4: 1.11 ng/dL (ref 0.60–1.60)

## 2019-03-19 LAB — HEMOGLOBIN A1C: Hgb A1c MFr Bld: 5.8 % (ref 4.6–6.5)

## 2019-03-19 LAB — TSH: TSH: 0.72 u[IU]/mL (ref 0.35–4.50)

## 2019-03-19 MED ORDER — ALBUTEROL SULFATE HFA 108 (90 BASE) MCG/ACT IN AERS: INHALATION_SPRAY | RESPIRATORY_TRACT | 2 refills | Status: DC

## 2019-03-19 MED ORDER — VERAPAMIL HCL ER 240 MG PO TBCR: 240.0000 mg | EXTENDED_RELEASE_TABLET | Freq: Every day | ORAL | 3 refills | Status: DC

## 2019-03-19 NOTE — Assessment & Plan Note (Signed)
On Levothyroxine, continue to monitor 

## 2019-03-19 NOTE — Progress Notes (Signed)
Subjective:    Patient ID: Alyssa Williamson, female    DOB: [birth year], 86 y.o.   MRN: [id number]  Chief Complaint  Patient presents with  . Follow-up    HPI Patient is in today for follow up on chronic medical concerns. She is feeling well. No recent febrile illness or hospitalizations. No polyuria or polydipsia. Denies CP/palp/SOB/HA/congestion/fevers/GI or GU c/o. Taking meds as prescribed  Past Medical History:  Diagnosis Date  . Allergic rhinitis   . Anemia   . Anxiety   . Arthritis    bilateral knee  . Atrial fibrillation ([facility]) 12/21/2016  . Cervical cancer screening 10/30/2013   Menarche at 15 Regular and moderate flow No history of abnormal pap in past G4P3, s/p 3 svd and 1 MC No history of abnormal MGM Noconcerns today No gyn surgeries  . Constipation 11/18/2015  . Depression with anxiety   . Diverticulitis   . Heart murmur   . Hyperlipidemia   . Hypertension   . Hypothyroid   . Left knee DJD   . Medicare annual wellness visit, subsequent 10/30/2013  . PONV (postoperative nausea and vomiting)   . Preventative health care 05/20/2016    Past Surgical History:  Procedure Laterality Date  . ABDOMINAL HYSTERECTOMY    . bladder tack  04/2010   Dr Marciano Sequin  . GANGLION CYST EXCISION    . JOINT REPLACEMENT Right   . REPLACEMENT TOTAL KNEE  10/2010   right knee-Murphy   . TONSILLECTOMY    . TOTAL KNEE ARTHROPLASTY Left 07/26/2012   Procedure: TOTAL KNEE ARTHROPLASTY;  Surgeon: Loreta Ave, MD;  Location: [facility];  Service: Orthopedics;  Laterality: Left;    Family History  Problem Relation Age of Onset  . Alcohol abuse Father   . Breast cancer Sister   . Cancer Sister        bladder  . Glaucoma Sister   . Arthritis Daughter   . Diabetes Daughter        type 2  . Cancer Daughter        brain cancer, diagnosed 29 years.agp  . Mental illness Daughter        s/p craniotomies, gamma knife for tumors. numerous shunts.  . Heart failure Mother   . Cancer Brother         glioblastoma   . Glaucoma Brother   . Diverticulosis Sister   . Cancer Sister        liver  . Cancer Brother        melanoma, mets to lung and brain and intestine  . Alcohol abuse Brother   . Cancer Brother        liver  . Glaucoma Maternal Grandfather   . Cancer Sister        breast  . Colon cancer Neg Hx   . Hypertension Neg Hx     Social History   Socioeconomic History  . Marital status: Widowed    Spouse name: Not on file  . Number of children: 3  . Years of education: Not on file  . Highest education level: Not on file  Occupational History  . Not on file  Tobacco Use  . Smoking status: Never Smoker  . Smokeless tobacco: Never Used  Substance and Sexual Activity  . Alcohol use: No  . Drug use: No  . Sexual activity: Not on file  Other Topics Concern  . Not on file  Social History Narrative  . Not on file  Social Determinants of Health   Financial Resource Strain:   . Difficulty of Paying Living Expenses: Not on file  Food Insecurity:   . Worried About Programme researcher, broadcasting/film/video in the Last Year: Not on file  . Ran Out of Food in the Last Year: Not on file  Transportation Needs:   . Lack of Transportation (Medical): Not on file  . Lack of Transportation (Non-Medical): Not on file  Physical Activity:   . Days of Exercise per Week: Not on file  . Minutes of Exercise per Session: Not on file  Stress:   . Feeling of Stress : Not on file  Social Connections:   . Frequency of Communication with Friends and Family: Not on file  . Frequency of Social Gatherings with Friends and Family: Not on file  . Attends Religious Services: Not on file  . Active Member of Clubs or Organizations: Not on file  . Attends Banker Meetings: Not on file  . Marital Status: Not on file  Intimate Partner Violence:   . Fear of Current or Ex-Partner: Not on file  . Emotionally Abused: Not on file  . Physically Abused: Not on file  . Sexually Abused: Not on file     Outpatient Medications Prior to Visit  Medication Sig Dispense Refill  . ALPRAZolam (XANAX) 0.25 MG tablet TAKE 1 TABLET(0.25 MG) BY MOUTH DAILY AS NEEDED FOR ANXIETY 30 tablet 1  . apixaban (ELIQUIS) 5 MG TABS tablet TAKE 1 TABLET(5 MG) BY MOUTH TWICE DAILY 180 tablet 1  . atorvastatin (LIPITOR) 40 MG tablet TAKE 1 TABLET(40 MG) BY MOUTH DAILY 90 tablet 1  . citalopram (CELEXA) 20 MG tablet TAKE 1 TABLET(20 MG) BY MOUTH DAILY 90 tablet 1  . Cranberry 500 MG CAPS Take 500 mg by mouth daily.    Marland Kitchen levothyroxine (SYNTHROID) 75 MCG tablet TAKE 1 TABLET(75 MCG) BY MOUTH DAILY 90 tablet 1  . loratadine (CLARITIN) 10 MG tablet Take 10 mg by mouth daily.    . Multiple Vitamins-Minerals (CENTRUM SILVER PO) Take 1 tablet by mouth daily.    Marland Kitchen PROVENTIL HFA 108 (90 Base) MCG/ACT inhaler INHALE 2 PUFFS INTO THE LUNGS EVERY 6 HOURS AS NEEDED FOR WHEEZING OR SHORTNESS OF BREATH 6.7 g 2  . verapamil (CALAN-SR) 240 MG CR tablet Take 1 tablet (240 mg total) by mouth daily. 30 tablet 3   No facility-administered medications prior to visit.    Allergies  Allergen Reactions  . Cefdinir Dermatitis  . Codeine Nausea And Vomiting    Review of Systems  Constitutional: Positive for malaise/fatigue. Negative for fever.  HENT: Negative for congestion.   Eyes: Negative for blurred vision.  Respiratory: Negative for shortness of breath.   Cardiovascular: Negative for chest pain, palpitations and leg swelling.  Gastrointestinal: Negative for abdominal pain, blood in stool and nausea.  Genitourinary: Negative for dysuria and frequency.  Musculoskeletal: Positive for joint pain. Negative for falls.  Skin: Negative for rash.  Neurological: Negative for dizziness, loss of consciousness and headaches.  Endo/Heme/Allergies: Negative for environmental allergies.  Psychiatric/Behavioral: Negative for depression. The patient is not nervous/anxious.        Objective:    Physical Exam Vitals and nursing note  reviewed.  Constitutional:      General: She is not in acute distress.    Appearance: She is well-developed.  HENT:     Head: Normocephalic and atraumatic.     Nose: Nose normal.  Eyes:     General:  Right eye: No discharge.        Left eye: No discharge.  Cardiovascular:     Rate and Rhythm: Normal rate. Rhythm irregular.     Heart sounds: No murmur.  Pulmonary:     Effort: Pulmonary effort is normal.     Breath sounds: Normal breath sounds.  Abdominal:     General: Bowel sounds are normal.     Palpations: Abdomen is soft.     Tenderness: There is no abdominal tenderness.  Musculoskeletal:     Cervical back: Normal range of motion and neck supple.  Skin:    General: Skin is warm and dry.  Neurological:     Mental Status: She is alert and oriented to person, place, and time.     BP 124/60 (BP Location: Left Arm, Patient Position: Sitting, Cuff Size: Normal)   Pulse 60   Temp 97.8 F (36.6 C) (Temporal)   Resp 18   Wt 143 lb (64.9 kg)   SpO2 97%   BMI 27.02 kg/m  Wt Readings from Last 3 Encounters:  03/19/19 143 lb (64.9 kg)  11/15/18 179 lb 12.8 oz (81.6 kg)  03/07/18 175 lb 12.8 oz (79.7 kg)    Diabetic Foot Exam - Simple   No data filed     Lab Results  Component Value Date   WBC 9.7 11/15/2018   HGB 13.7 11/15/2018   HCT 41.3 11/15/2018   PLT 190 11/15/2018   GLUCOSE 116 (H) 11/13/2018   CHOL 147 11/13/2018   TRIG 89.0 11/13/2018   HDL 60.00 11/13/2018   LDLCALC 69 11/13/2018   ALT 18 11/13/2018   AST 22 11/13/2018   NA 138 11/13/2018   K 4.6 11/13/2018   CL 102 11/13/2018   CREATININE 1.18 11/13/2018   BUN 22 11/13/2018   CO2 27 11/13/2018   TSH 0.27 (L) 11/13/2018   INR 0.99 07/24/2012   HGBA1C 5.6 01/05/2018    Lab Results  Component Value Date   TSH 0.27 (L) 11/13/2018   Lab Results  Component Value Date   WBC 9.7 11/15/2018   HGB 13.7 11/15/2018   HCT 41.3 11/15/2018   MCV 97 11/15/2018   PLT 190 11/15/2018   Lab  Results  Component Value Date   NA 138 11/13/2018   K 4.6 11/13/2018   CO2 27 11/13/2018   GLUCOSE 116 (H) 11/13/2018   BUN 22 11/13/2018   CREATININE 1.18 11/13/2018   BILITOT 0.7 11/13/2018   ALKPHOS 74 11/13/2018   AST 22 11/13/2018   ALT 18 11/13/2018   PROT 6.2 11/13/2018   ALBUMIN 3.7 11/13/2018   CALCIUM 9.1 11/13/2018   ANIONGAP 8 11/08/2016   GFR 43.47 (L) 11/13/2018   Lab Results  Component Value Date   CHOL 147 11/13/2018   Lab Results  Component Value Date   HDL 60.00 11/13/2018   Lab Results  Component Value Date   LDLCALC 69 11/13/2018   Lab Results  Component Value Date   TRIG 89.0 11/13/2018   Lab Results  Component Value Date   CHOLHDL 2 11/13/2018   Lab Results  Component Value Date   HGBA1C 5.6 01/05/2018       Assessment & Plan:   Problem List Items Addressed This Visit    Hypothyroidism    On Levothyroxine, continue to monitor      Relevant Orders   TSH   T4, free   HTN (hypertension)    Well controlled, no changes to meds. Encouraged  heart healthy diet such as the DASH diet and exercise as tolerated. Refill given on Verapamil      Relevant Medications   verapamil (CALAN-SR) 240 MG CR tablet   Other Relevant Orders   CBC   Comprehensive metabolic panel   Hyperlipidemia - Primary    Tolerating statin, encouraged heart healthy diet, avoid trans fats, minimize simple carbs and saturated fats. Increase exercise as tolerated      Relevant Medications   verapamil (CALAN-SR) 240 MG CR tablet   Other Relevant Orders   Lipid panel   Hyperglycemia    hgba1c acceptable, minimize simple carbs. Increase exercise as tolerated.      Relevant Orders   Hemoglobin A1c   Atrial fibrillation (HCC)    Rate controlled and tolerating meds.      Relevant Medications   verapamil (CALAN-SR) 240 MG CR tablet      I have changed Delorise Shiner E. Mallery's Proventil HFA to albuterol. I am also having her maintain her Multiple Vitamins-Minerals  (CENTRUM SILVER PO), Cranberry, loratadine, apixaban, atorvastatin, citalopram, levothyroxine, ALPRAZolam, and verapamil.  Meds ordered this encounter  Medications  . verapamil (CALAN-SR) 240 MG CR tablet    Sig: Take 1 tablet (240 mg total) by mouth daily.    Dispense:  30 tablet    Refill:  3  . albuterol (PROVENTIL HFA) 108 (90 Base) MCG/ACT inhaler    Sig: INHALE 2 PUFFS INTO THE LUNGS EVERY 6 HOURS AS NEEDED FOR WHEEZING OR SHORTNESS OF BREATH    Dispense:  6.7 g    Refill:  2     Danise Edge, MD

## 2019-03-19 NOTE — Patient Instructions (Signed)
[facility] Blood Pressure Cuff,  Upper arm  Pulse oximeter want oxygen in the 90s  Multivitamin with minerals, selenium Vitamin D 1000-2000 IU daily Probiotic daily  Melatonin 2.5 mg to 5 mg at bedtime  COVID-19 COVID-19 is a respiratory infection that is caused by a virus called severe acute respiratory syndrome coronavirus 2 (SARS-CoV-2). The disease is also known as coronavirus disease or novel coronavirus. In some people, the virus may not cause any symptoms. In others, it may cause a serious infection. The infection can get worse quickly and can lead to complications, such as:  Pneumonia, or infection of the lungs.  Acute respiratory distress syndrome or ARDS. This is a condition in which fluid build-up in the lungs prevents the lungs from filling with air and passing oxygen into the blood.  Acute respiratory failure. This is a condition in which there is not enough oxygen passing from the lungs to the body or when carbon dioxide is not passing from the lungs out of the body.  Sepsis or septic shock. This is a serious bodily reaction to an infection.  Blood clotting problems.  Secondary infections due to bacteria or fungus.  Organ failure. This is when your body's organs stop working. The virus that causes COVID-19 is contagious. This means that it can spread from person to person through droplets from coughs and sneezes (respiratory secretions). What are the causes? This illness is caused by a virus. You may catch the virus by:  Breathing in droplets from an infected person. Droplets can be spread by a person breathing, speaking, singing, coughing, or sneezing.  Touching something, like a table or a doorknob, that was exposed to the virus (contaminated) and then touching your mouth, nose, or eyes. What increases the risk? Risk for infection You are more likely to be infected with this virus if you:  Are within 6 feet (2 meters) of a person with COVID-19.  Provide care for or  live with a person who is infected with COVID-19.  Spend time in crowded indoor spaces or live in shared housing. Risk for serious illness You are more likely to become seriously ill from the virus if you:  Are 84 years of age or older. The higher your age, the more you are at risk for serious illness.  Live in a nursing home or long-term care facility.  Have cancer.  Have a long-term (chronic) disease such as: ? Chronic lung disease, including chronic obstructive pulmonary disease or asthma. ? A long-term disease that lowers your body's ability to fight infection (immunocompromised). ? Heart disease, including heart failure, a condition in which the arteries that lead to the heart become narrow or blocked (coronary artery disease), a disease which makes the heart muscle thick, weak, or stiff (cardiomyopathy). ? Diabetes. ? Chronic kidney disease. ? Sickle cell disease, a condition in which red blood cells have an abnormal "sickle" shape. ? Liver disease.  Are obese. What are the signs or symptoms? Symptoms of this condition can range from mild to severe. Symptoms may appear any time from 2 to 14 days after being exposed to the virus. They include:  A fever or chills.  A cough.  Difficulty breathing.  Headaches, body aches, or muscle aches.  Runny or stuffy (congested) nose.  A sore throat.  New loss of taste or smell. Some people may also have stomach problems, such as nausea, vomiting, or diarrhea. Other people may not have any symptoms of COVID-19. How is this diagnosed? This condition may  be diagnosed based on:  Your signs and symptoms, especially if: ? You live in an area with a COVID-19 outbreak. ? You recently traveled to or from an area where the virus is common. ? You provide care for or live with a person who was diagnosed with COVID-19. ? You were exposed to a person who was diagnosed with COVID-19.  A physical exam.  Lab tests, which may  include: ? Taking a sample of fluid from the back of your nose and throat (nasopharyngeal fluid), your nose, or your throat using a swab. ? A sample of mucus from your lungs (sputum). ? Blood tests.  Imaging tests, which may include, X-rays, CT scan, or ultrasound. How is this treated? At present, there is no medicine to treat COVID-19. Medicines that treat other diseases are being used on a trial basis to see if they are effective against COVID-19. Your health care provider will talk with you about ways to treat your symptoms. For most people, the infection is mild and can be managed at home with rest, fluids, and over-the-counter medicines. Treatment for a serious infection usually takes places in a hospital intensive care unit (ICU). It may include one or more of the following treatments. These treatments are given until your symptoms improve.  Receiving fluids and medicines through an IV.  Supplemental oxygen. Extra oxygen is given through a tube in the nose, a face mask, or a hood.  Positioning you to lie on your stomach (prone position). This makes it easier for oxygen to get into the lungs.  Continuous positive airway pressure (CPAP) or bi-level positive airway pressure (BPAP) machine. This treatment uses mild air pressure to keep the airways open. A tube that is connected to a motor delivers oxygen to the body.  Ventilator. This treatment moves air into and out of the lungs by using a tube that is placed in your windpipe.  Tracheostomy. This is a procedure to create a hole in the neck so that a breathing tube can be inserted.  Extracorporeal membrane oxygenation (ECMO). This procedure gives the lungs a chance to recover by taking over the functions of the heart and lungs. It supplies oxygen to the body and removes carbon dioxide. Follow these instructions at home: Lifestyle  If you are sick, stay home except to get medical care. Your health care provider will tell you how long to  stay home. Call your health care provider before you go for medical care.  Rest at home as told by your health care provider.  Do not use any products that contain nicotine or tobacco, such as cigarettes, e-cigarettes, and chewing tobacco. If you need help quitting, ask your health care provider.  Return to your normal activities as told by your health care provider. Ask your health care provider what activities are safe for you. General instructions  Take over-the-counter and prescription medicines only as told by your health care provider.  Drink enough fluid to keep your urine pale yellow.  Keep all follow-up visits as told by your health care provider. This is important. How is this prevented?  There is no vaccine to help prevent COVID-19 infection. However, there are steps you can take to protect yourself and others from this virus. To protect yourself:   Do not travel to areas where COVID-19 is a risk. The areas where COVID-19 is reported change often. To identify high-risk areas and travel restrictions, check the CDC travel website: FatFares.com.br  If you live in, or must travel  to, an area where COVID-19 is a risk, take precautions to avoid infection. ? Stay away from people who are sick. ? Wash your hands often with soap and water for 20 seconds. If soap and water are not available, use an alcohol-based hand sanitizer. ? Avoid touching your mouth, face, eyes, or nose. ? Avoid going out in public, follow guidance from your state and local health authorities. ? If you must go out in public, wear a cloth face covering or face mask. Make sure your mask covers your nose and mouth. ? Avoid crowded indoor spaces. Stay at least 6 feet (2 meters) away from others. ? Disinfect objects and surfaces that are frequently touched every day. This may include:  Counters and tables.  Doorknobs and light switches.  Sinks and faucets.  Electronics, such as phones, remote  controls, keyboards, computers, and tablets. To protect others: If you have symptoms of COVID-19, take steps to prevent the virus from spreading to others.  If you think you have a COVID-19 infection, contact your health care provider right away. Tell your health care team that you think you may have a COVID-19 infection.  Stay home. Leave your house only to seek medical care. Do not use public transport.  Do not travel while you are sick.  Wash your hands often with soap and water for 20 seconds. If soap and water are not available, use alcohol-based hand sanitizer.  Stay away from other members of your household. Let healthy household members care for children and pets, if possible. If you have to care for children or pets, wash your hands often and wear a mask. If possible, stay in your own room, separate from others. Use a different bathroom.  Make sure that all people in your household wash their hands well and often.  Cough or sneeze into a tissue or your sleeve or elbow. Do not cough or sneeze into your hand or into the air.  Wear a cloth face covering or face mask. Make sure your mask covers your nose and mouth. Where to find more information  Centers for Disease Control and Prevention: PurpleGadgets.be  World Health Organization: https://www.castaneda.info/ Contact a health care provider if:  You live in or have traveled to an area where COVID-19 is a risk and you have symptoms of the infection.  You have had contact with someone who has COVID-19 and you have symptoms of the infection. Get help right away if:  You have trouble breathing.  You have pain or pressure in your chest.  You have confusion.  You have bluish lips and fingernails.  You have difficulty waking from sleep.  You have symptoms that get worse. These symptoms may represent a serious problem that is an emergency. Do not wait to see if the symptoms will go away.  Get medical help right away. Call your local emergency services (911 in the [place] Do not drive yourself to the hospital. Let the emergency medical personnel know if you think you have COVID-19. Summary  COVID-19 is a respiratory infection that is caused by a virus. It is also known as coronavirus disease or novel coronavirus. It can cause serious infections, such as pneumonia, acute respiratory distress syndrome, acute respiratory failure, or sepsis.  The virus that causes COVID-19 is contagious. This means that it can spread from person to person through droplets from breathing, speaking, singing, coughing, or sneezing.  You are more likely to develop a serious illness if you are 60 years of age or  older, have a weak immune system, live in a nursing home, or have chronic disease.  There is no medicine to treat COVID-19. Your health care provider will talk with you about ways to treat your symptoms.  Take steps to protect yourself and others from infection. Wash your hands often and disinfect objects and surfaces that are frequently touched every day. Stay away from people who are sick and wear a mask if you are sick. This information is not intended to replace advice given to you by your health care provider. Make sure you discuss any questions you have with your health care provider. Document Revised: 12/08/2018 Document Reviewed: 03/16/2018 Elsevier Patient Education  [place].

## 2019-03-19 NOTE — Assessment & Plan Note (Signed)
hgba1c acceptable, minimize simple carbs. Increase exercise as tolerated.  

## 2019-03-19 NOTE — Assessment & Plan Note (Signed)
Rate controlled and tolerating meds.  

## 2019-03-19 NOTE — Assessment & Plan Note (Signed)
Tolerating statin, encouraged heart healthy diet, avoid trans fats, minimize simple carbs and saturated fats. Increase exercise as tolerated 

## 2019-03-19 NOTE — Assessment & Plan Note (Addendum)
Well controlled, no changes to meds. Encouraged heart healthy diet such as the DASH diet and exercise as tolerated. Refill given on Verapamil

## 2019-05-08 ENCOUNTER — Other Ambulatory Visit: Payer: Self-pay | Admitting: Pharmacist

## 2019-05-08 MED ORDER — APIXABAN 5 MG PO TABS: ORAL_TABLET | ORAL | 1 refills | Status: DC

## 2019-05-08 NOTE — Telephone Encounter (Signed)
Afib Last OV 10/2018 with Dr Jens Som Scr = 1.10 on 03/18/2018 Wt 64.9kg

## 2019-05-10 ENCOUNTER — Other Ambulatory Visit: Payer: Self-pay | Admitting: Family Medicine

## 2019-05-10 DIAGNOSIS — I10 Essential (primary) hypertension: Secondary | ICD-10-CM

## 2019-05-10 NOTE — Telephone Encounter (Signed)
Requesting: alprazolam Contract:05/20/16 UDS:12/21/2016 Last Visit:03/19/19 Next Visit:n/a Last Refill:03/15/19(30,1)  Please Advise. Medication pending

## 2019-06-07 ENCOUNTER — Other Ambulatory Visit: Payer: Self-pay | Admitting: Family Medicine

## 2019-06-09 ENCOUNTER — Other Ambulatory Visit: Payer: Self-pay | Admitting: Family Medicine

## 2019-06-12 ENCOUNTER — Other Ambulatory Visit: Payer: Self-pay | Admitting: Family Medicine

## 2019-06-12 DIAGNOSIS — I10 Essential (primary) hypertension: Secondary | ICD-10-CM

## 2019-06-12 NOTE — Telephone Encounter (Signed)
Requesting: xanax Contract: N/A UDS:N/A Last Visit:1.25.21 Next Visit:N/A Last Refill: 3.18.21  Please Advise

## 2019-06-14 NOTE — Progress Notes (Signed)
HPI: FUatrial fibrillation.Nuclear study May 2014 showed breast attenuation but no ischemia. Ejection fraction 79%. Echo September 2018 showed normal LV function, mild mitral regurgitation and mild biatrial enlargement.Noted to be in new onset atrial 9/18. Seen in atrial fibrillation clinic; treated with rate control and anticoagulation. TSH normal. Since last seen,she has noticed increased dyspnea on exertion and mild pedal edema in February.  Now she describes a mild wheeze when she exerts herself.  She has not had chest pain, palpitations or syncope.  Current Outpatient Medications  Medication Sig Dispense Refill  . albuterol (PROVENTIL HFA) 108 (90 Base) MCG/ACT inhaler INHALE 2 PUFFS INTO THE LUNGS EVERY 6 HOURS AS NEEDED FOR WHEEZING OR SHORTNESS OF BREATH 6.7 g 2  . ALPRAZolam (XANAX) 0.25 MG tablet TAKE 1 TABLET(0.25 MG) BY MOUTH DAILY AS NEEDED FOR ANXIETY 30 tablet 0  . apixaban (ELIQUIS) 5 MG TABS tablet TAKE 1 TABLET(5 MG) BY MOUTH TWICE DAILY 180 tablet 1  . atorvastatin (LIPITOR) 40 MG tablet TAKE 1 TABLET(40 MG) BY MOUTH DAILY 90 tablet 1  . citalopram (CELEXA) 20 MG tablet TAKE 1 TABLET(20 MG) BY MOUTH DAILY 90 tablet 1  . Cranberry 500 MG CAPS Take 500 mg by mouth daily.    Marland Kitchen levothyroxine (SYNTHROID) 75 MCG tablet TAKE 1 TABLET(75 MCG) BY MOUTH DAILY 90 tablet 1  . loratadine (CLARITIN) 10 MG tablet Take 10 mg by mouth daily.    . Multiple Vitamins-Minerals (CENTRUM SILVER PO) Take 1 tablet by mouth daily.    . verapamil (CALAN-SR) 240 MG CR tablet Take 1 tablet (240 mg total) by mouth daily. 90 tablet 1   No current facility-administered medications for this visit.     Past Medical History:  Diagnosis Date  . Allergic rhinitis   . Anemia   . Anxiety   . Arthritis    bilateral knee  . Atrial fibrillation ([place]) 12/21/2016  . Cervical cancer screening 10/30/2013   Menarche at 15 Regular and moderate flow No history of abnormal pap in past G4P3, s/p 3 svd and 1  MC No history of abnormal MGM Noconcerns today No gyn surgeries  . Constipation 11/18/2015  . Depression with anxiety   . Diverticulitis   . Heart murmur   . Hyperlipidemia   . Hypertension   . Hypothyroid   . Left knee DJD   . Medicare annual wellness visit, subsequent 10/30/2013  . PONV (postoperative nausea and vomiting)   . Preventative health care 05/20/2016    Past Surgical History:  Procedure Laterality Date  . ABDOMINAL HYSTERECTOMY    . bladder tack  04/2010   Dr Dinah Beers  . GANGLION CYST EXCISION    . JOINT REPLACEMENT Right   . REPLACEMENT TOTAL KNEE  10/2010   right knee-[place]   . TONSILLECTOMY    . TOTAL KNEE ARTHROPLASTY Left 07/26/2012   Procedure: TOTAL KNEE ARTHROPLASTY;  Surgeon: Ninetta Lights, MD;  Location: [facility];  Service: Orthopedics;  Laterality: Left;    Social History   Socioeconomic History  . Marital status: Widowed    Spouse name: Not on file  . Number of children: 3  . Years of education: Not on file  . Highest education level: Not on file  Occupational History  . Not on file  Tobacco Use  . Smoking status: Never Smoker  . Smokeless tobacco: Never Used  Substance and Sexual Activity  . Alcohol use: No  . Drug use: No  . Sexual  activity: Not on file  Other Topics Concern  . Not on file  Social History Narrative  . Not on file   Social Determinants of Health   Financial Resource Strain:   . Difficulty of Paying Living Expenses:   Food Insecurity:   . Worried About Programme researcher, broadcasting/film/video in the Last Year:   . Barista in the Last Year:   Transportation Needs:   . Freight forwarder (Medical):   Marland Kitchen Lack of Transportation (Non-Medical):   Physical Activity:   . Days of Exercise per Week:   . Minutes of Exercise per Session:   Stress:   . Feeling of Stress :   Social Connections:   . Frequency of Communication with Friends and Family:   . Frequency of Social Gatherings with Friends and Family:   . Attends Religious  Services:   . Active Member of Clubs or Organizations:   . Attends Banker Meetings:   Marland Kitchen Marital Status:   Intimate Partner Violence:   . Fear of Current or Ex-Partner:   . Emotionally Abused:   Marland Kitchen Physically Abused:   . Sexually Abused:     Family History  Problem Relation Age of Onset  . Alcohol abuse Father   . Breast cancer Sister   . Cancer Sister        bladder  . Glaucoma Sister   . Arthritis Daughter   . Diabetes Daughter        type 2  . Cancer Daughter        brain cancer, diagnosed 29 years.agp  . Mental illness Daughter        s/p craniotomies, gamma knife for tumors. numerous shunts.  . Heart failure Mother   . Cancer Brother        glioblastoma   . Glaucoma Brother   . Diverticulosis Sister   . Cancer Sister        liver  . Cancer Brother        melanoma, mets to lung and brain and intestine  . Alcohol abuse Brother   . Cancer Brother        liver  . Glaucoma Maternal Grandfather   . Cancer Sister        breast  . Colon cancer Neg Hx   . Hypertension Neg Hx     ROS: no fevers or chills, productive cough, hemoptysis, dysphasia, odynophagia, melena, hematochezia, dysuria, hematuria, rash, seizure activity, orthopnea, PND, pedal edema, claudication. Remaining systems are negative.  Physical Exam: Well-developed well-nourished in no acute distress.  Skin is warm and dry.  HEENT is normal.  Neck is supple.  Chest is clear to auscultation with normal expansion.  Cardiovascular exam is regular rate and rhythm.  Abdominal exam nontender or distended. No masses palpated. Extremities show trace to 1+ edema. neuro grossly intact  ECG-sinus rhythm at a rate of 57, low voltage, nonspecific ST changes.  Personally reviewed  A/P  1 paroxysmal atrial fibrillation-patient remains in sinus rhythm today.  Continue verapamil and apixaban.  2 chronic diastolic congestive heart failure-patient with recent onset of dyspnea on exertion and pedal edema.   We will plan to repeat echocardiogram to reassess LV function.  I will give Lasix 20 mg every other day.  Check potassium and renal function in 1 week.  We discussed low-sodium diet.  3 hypertension-patient's blood pressure is controlled.  Continue present medical regimen.  4 hyperlipidemia-continue statin.  [name], MD

## 2019-06-20 ENCOUNTER — Other Ambulatory Visit: Payer: Self-pay

## 2019-06-20 ENCOUNTER — Ambulatory Visit (INDEPENDENT_AMBULATORY_CARE_PROVIDER_SITE_OTHER): Payer: Medicare Other | Admitting: Cardiology

## 2019-06-20 ENCOUNTER — Encounter: Payer: Self-pay | Admitting: Cardiology

## 2019-06-20 VITALS — BP 130/74 | HR 57 | Ht 62.0 in | Wt 186.8 lb

## 2019-06-20 DIAGNOSIS — I10 Essential (primary) hypertension: Secondary | ICD-10-CM | POA: Diagnosis not present

## 2019-06-20 DIAGNOSIS — E7800 Pure hypercholesterolemia, unspecified: Secondary | ICD-10-CM | POA: Diagnosis not present

## 2019-06-20 DIAGNOSIS — I480 Paroxysmal atrial fibrillation: Secondary | ICD-10-CM

## 2019-06-20 DIAGNOSIS — I509 Heart failure, unspecified: Secondary | ICD-10-CM

## 2019-06-20 MED ORDER — FUROSEMIDE 20 MG PO TABS: 20.0000 mg | ORAL_TABLET | ORAL | 3 refills | Status: DC

## 2019-06-20 NOTE — Patient Instructions (Signed)
Medication Instructions:   START FUROSEMIDE 20 MG ONE TABLET EVERY OTHER DAY  *If you need a refill on your cardiac medications before your next appointment, please call your pharmacy*   Lab Work: Your physician recommends that you return for lab work in: ONE WEEK  If you have labs (blood work) drawn today and your tests are completely normal, you will receive your results only by: Marland Kitchen MyChart Message (if you have MyChart) OR . A paper copy in the mail If you have any lab test that is abnormal or we need to change your treatment, we will call you to review the results.   Testing/Procedures: Your physician has requested that you have an echocardiogram. Echocardiography is a painless test that uses sound waves to create images of your heart. It provides your doctor with information about the size and shape of your heart and how well your heart's chambers and valves are working. This procedure takes approximately one hour. There are no restrictions for this procedure.[facility]-1ST FLOOR IMAGING   Follow-Up: At [facility], you and your health needs are our priority.  As part of our continuing mission to provide you with exceptional heart care, we have created designated Provider Care Teams.  These Care Teams include your primary Cardiologist (physician) and Advanced Practice Providers (APPs -  Physician Assistants and Nurse Practitioners) who all work together to provide you with the care you need, when you need it.  We recommend signing up for the patient portal called "MyChart".  Sign up information is provided on this After Visit Summary.  MyChart is used to connect with patients for Virtual Visits (Telemedicine).  Patients are able to view lab/test results, encounter notes, upcoming appointments, etc.  Non-urgent messages can be sent to your provider as well.   To learn more about what you can do with MyChart, go to [email or web address].    Your next appointment:   6  month(s)  The format for your next appointment:   Either In Person or Virtual  Provider:   [name], MD

## 2019-06-26 ENCOUNTER — Ambulatory Visit (HOSPITAL_BASED_OUTPATIENT_CLINIC_OR_DEPARTMENT_OTHER)
Admission: RE | Admit: 2019-06-26 | Discharge: 2019-06-26 | Disposition: A | Discharge status: Home / Self Care | Payer: Medicare Other | Source: Ambulatory Visit | Attending: Cardiology | Admitting: Cardiology

## 2019-06-26 ENCOUNTER — Other Ambulatory Visit: Payer: Self-pay

## 2019-06-26 DIAGNOSIS — I509 Heart failure, unspecified: Secondary | ICD-10-CM | POA: Insufficient documentation

## 2019-06-26 NOTE — Progress Notes (Signed)
  Echocardiogram 2D Echocardiogram has been performed.  Sinda Du 06/26/2019, 3:59 PM

## 2019-06-27 LAB — BASIC METABOLIC PANEL
BUN/Creatinine Ratio: 17 (ref 12–28)
BUN: 20 mg/dL (ref 8–27)
CO2: 23 mmol/L (ref 20–29)
Calcium: 8.6 mg/dL — ABNORMAL LOW (ref 8.7–10.3)
Chloride: 102 mmol/L (ref 96–106)
Creatinine, Ser: 1.2 mg/dL — ABNORMAL HIGH (ref 0.57–1.00)
GFR calc Af Amer: 47 mL/min/{1.73_m2} — ABNORMAL LOW (ref >59)
GFR calc non Af Amer: 41 mL/min/{1.73_m2} — ABNORMAL LOW (ref >59)
Glucose: 103 mg/dL — ABNORMAL HIGH (ref 65–99)
Potassium: 4.3 mmol/L (ref 3.5–5.2)
Sodium: 139 mmol/L (ref 134–144)

## 2019-07-03 ENCOUNTER — Other Ambulatory Visit: Payer: Self-pay | Admitting: Family Medicine

## 2019-07-06 ENCOUNTER — Other Ambulatory Visit: Payer: Self-pay

## 2019-07-06 ENCOUNTER — Ambulatory Visit (INDEPENDENT_AMBULATORY_CARE_PROVIDER_SITE_OTHER): Payer: Medicare Other | Admitting: Medical

## 2019-07-06 ENCOUNTER — Ambulatory Visit (HOSPITAL_BASED_OUTPATIENT_CLINIC_OR_DEPARTMENT_OTHER)
Admission: RE | Admit: 2019-07-06 | Discharge: 2019-07-06 | Disposition: A | Discharge status: Home / Self Care | Payer: Medicare Other | Source: Ambulatory Visit | Attending: Medical | Admitting: Medical

## 2019-07-06 VITALS — BP 158/51 | HR 66 | Temp 97.0°F | Resp 18 | Ht 62.0 in | Wt 184.4 lb

## 2019-07-06 DIAGNOSIS — M545 Low back pain, unspecified: Secondary | ICD-10-CM

## 2019-07-06 MED ORDER — PREDNISONE 10 MG PO TABS
ORAL_TABLET | ORAL | 0 refills | Status: DC
Start: 2019-07-06 — End: 2020-02-06

## 2019-07-06 MED ORDER — CYCLOBENZAPRINE HCL 5 MG PO TABS
5.0000 mg | ORAL_TABLET | Freq: Every day | ORAL | 0 refills | Status: DC
Start: 2019-07-06 — End: 2019-12-25

## 2019-07-06 NOTE — Patient Instructions (Signed)
For your low back pain region pain for 2 weeks with hx of pain in the past will get xray of lumbar spine.  Will prescribe low dose taper prednisone and low dose flexeril.  Avoid nsaids due to kidney function.  If any skin rash or urinary symptoms let me know.  Back stretching exercises.  Follow up 10 days or as needed   Back Exercises These exercises help to make your trunk and back strong. They also help to keep the lower back flexible. Doing these exercises can help to prevent back pain or lessen existing pain.  If you have back pain, try to do these exercises 2-3 times each day or as told by your doctor.  As you get better, do the exercises once each day. Repeat the exercises more often as told by your doctor.  To stop back pain from coming back, do the exercises once each day, or as told by your doctor. Exercises Single knee to chest Do these steps 3-5 times in a row for each leg: 1. Lie on your back on a firm bed or the floor with your legs stretched out. 2. Bring one knee to your chest. 3. Grab your knee or thigh with both hands and hold them it in place. 4. Pull on your knee until you feel a gentle stretch in your lower back or buttocks. 5. Keep doing the stretch for 10-30 seconds. 6. Slowly let go of your leg and straighten it. Pelvic tilt Do these steps 5-10 times in a row: 1. Lie on your back on a firm bed or the floor with your legs stretched out. 2. Bend your knees so they point up to the ceiling. Your feet should be flat on the floor. 3. Tighten your lower belly (abdomen) muscles to press your lower back against the floor. This will make your tailbone point up to the ceiling instead of pointing down to your feet or the floor. 4. Stay in this position for 5-10 seconds while you gently tighten your muscles and breathe evenly. Cat-cow Do these steps until your lower back bends more easily: 1. Get on your hands and knees on a firm surface. Keep your hands under your  shoulders, and keep your knees under your hips. You may put padding under your knees. 2. Let your head hang down toward your chest. Tighten (contract) the muscles in your belly. Point your tailbone toward the floor so your lower back becomes rounded like the back of a cat. 3. Stay in this position for 5 seconds. 4. Slowly lift your head. Let the muscles of your belly relax. Point your tailbone up toward the ceiling so your back forms a sagging arch like the back of a cow. 5. Stay in this position for 5 seconds.  Press-ups Do these steps 5-10 times in a row: 1. Lie on your belly (face-down) on the floor. 2. Place your hands near your head, about shoulder-width apart. 3. While you keep your back relaxed and keep your hips on the floor, slowly straighten your arms to raise the top half of your body and lift your shoulders. Do not use your back muscles. You may change where you place your hands in order to make yourself more comfortable. 4. Stay in this position for 5 seconds. 5. Slowly return to lying flat on the floor.  Bridges Do these steps 10 times in a row: 1. Lie on your back on a firm surface. 2. Bend your knees so they point up to  the ceiling. Your feet should be flat on the floor. Your arms should be flat at your sides, next to your body. 3. Tighten your butt muscles and lift your butt off the floor until your waist is almost as high as your knees. If you do not feel the muscles working in your butt and the back of your thighs, slide your feet 1-2 inches farther away from your butt. 4. Stay in this position for 3-5 seconds. 5. Slowly lower your butt to the floor, and let your butt muscles relax. If this exercise is too easy, try doing it with your arms crossed over your chest. Belly crunches Do these steps 5-10 times in a row: 1. Lie on your back on a firm bed or the floor with your legs stretched out. 2. Bend your knees so they point up to the ceiling. Your feet should be flat on the  floor. 3. Cross your arms over your chest. 4. Tip your chin a little bit toward your chest but do not bend your neck. 5. Tighten your belly muscles and slowly raise your chest just enough to lift your shoulder blades a tiny bit off of the floor. Avoid raising your body higher than that, because it can put too much stress on your low back. 6. Slowly lower your chest and your head to the floor. Back lifts Do these steps 5-10 times in a row: 1. Lie on your belly (face-down) with your arms at your sides, and rest your forehead on the floor. 2. Tighten the muscles in your legs and your butt. 3. Slowly lift your chest off of the floor while you keep your hips on the floor. Keep the back of your head in line with the curve in your back. Look at the floor while you do this. 4. Stay in this position for 3-5 seconds. 5. Slowly lower your chest and your face to the floor. Contact a doctor if:  Your back pain gets a lot worse when you do an exercise.  Your back pain does not get better 2 hours after you exercise. If you have any of these problems, stop doing the exercises. Do not do them again unless your doctor says it is okay. Get help right away if:  You have sudden, very bad back pain. If this happens, stop doing the exercises. Do not do them again unless your doctor says it is okay. This information is not intended to replace advice given to you by your health care provider. Make sure you discuss any questions you have with your health care provider. Document Revised: 11/03/2017 Document Reviewed: 11/03/2017 Elsevier Patient Education  2020 [employer].

## 2019-07-06 NOTE — Progress Notes (Signed)
Subjective:    Patient ID: Alyssa Williamson, female    DOB: Sep 27, 1931, 86 y.o.   MRN: [id number]  HPI  Pt in for evaluation of rt side back pain. Pain has been present for about 2 weeks.   Pain has not been mid lumbar. No radiating pain. No frequent urination and no dysuria. No rash on her skin.   Dull ache that worsens at time with paritcular movement.  States on off pain in the is area for about year or 2.  Occasional mild spasm to back.   Review of Systems  Constitutional: Negative for chills, fatigue and fever.  Respiratory: Negative for cough, chest tightness, shortness of breath and wheezing.   Cardiovascular: Negative for chest pain and palpitations.  Gastrointestinal: Negative for abdominal pain.  Musculoskeletal: Positive for back pain. Negative for neck pain and neck stiffness.  Skin: Negative for rash.  Neurological: Negative for dizziness, weakness, numbness and headaches.  Hematological: Negative for adenopathy. Does not bruise/bleed easily.  Psychiatric/Behavioral: Negative for behavioral problems, dysphoric mood and suicidal ideas. The patient is not nervous/anxious.     Past Medical History:  Diagnosis Date  . Allergic rhinitis   . Anemia   . Anxiety   . Arthritis    bilateral knee  . Atrial fibrillation ([place]) 12/21/2016  . Cervical cancer screening 10/30/2013   Menarche at 15 Regular and moderate flow No history of abnormal pap in past G4P3, s/p 3 svd and 1 MC No history of abnormal MGM Noconcerns today No gyn surgeries  . Constipation 11/18/2015  . Depression with anxiety   . Diverticulitis   . Heart murmur   . Hyperlipidemia   . Hypertension   . Hypothyroid   . Left knee DJD   . Medicare annual wellness visit, subsequent 10/30/2013  . PONV (postoperative nausea and vomiting)   . Preventative health care 05/20/2016     Social History   Socioeconomic History  . Marital status: Widowed    Spouse name: Not on file  . Number of children: 3  . Years of  education: Not on file  . Highest education level: Not on file  Occupational History  . Not on file  Tobacco Use  . Smoking status: Never Smoker  . Smokeless tobacco: Never Used  Substance and Sexual Activity  . Alcohol use: No  . Drug use: No  . Sexual activity: Not on file  Other Topics Concern  . Not on file  Social History Narrative  . Not on file   Social Determinants of Health   Financial Resource Strain:   . Difficulty of Paying Living Expenses:   Food Insecurity:   . Worried About Charity fundraiser in the Last Year:   . Arboriculturist in the Last Year:   Transportation Needs:   . Film/video editor (Medical):   Marland Kitchen Lack of Transportation (Non-Medical):   Physical Activity:   . Days of Exercise per Week:   . Minutes of Exercise per Session:   Stress:   . Feeling of Stress :   Social Connections:   . Frequency of Communication with Friends and Family:   . Frequency of Social Gatherings with Friends and Family:   . Attends Religious Services:   . Active Member of Clubs or Organizations:   . Attends Archivist Meetings:   Marland Kitchen Marital Status:   Intimate Partner Violence:   . Fear of Current or Ex-Partner:   . Emotionally Abused:   .  Physically Abused:   . Sexually Abused:     Past Surgical History:  Procedure Laterality Date  . ABDOMINAL HYSTERECTOMY    . bladder tack  04/2010   Dr Marciano Sequin  . GANGLION CYST EXCISION    . JOINT REPLACEMENT Right   . REPLACEMENT TOTAL KNEE  10/2010   right knee-Murphy   . TONSILLECTOMY    . TOTAL KNEE ARTHROPLASTY Left 07/26/2012   Procedure: TOTAL KNEE ARTHROPLASTY;  Surgeon: Loreta Ave, MD;  Location: [facility];  Service: Orthopedics;  Laterality: Left;    Family History  Problem Relation Age of Onset  . Alcohol abuse Father   . Breast cancer Sister   . Cancer Sister        bladder  . Glaucoma Sister   . Arthritis Daughter   . Diabetes Daughter        type 2  . Cancer Daughter        brain  cancer, diagnosed 29 years.agp  . Mental illness Daughter        s/p craniotomies, gamma knife for tumors. numerous shunts.  . Heart failure Mother   . Cancer Brother        glioblastoma   . Glaucoma Brother   . Diverticulosis Sister   . Cancer Sister        liver  . Cancer Brother        melanoma, mets to lung and brain and intestine  . Alcohol abuse Brother   . Cancer Brother        liver  . Glaucoma Maternal Grandfather   . Cancer Sister        breast  . Colon cancer Neg Hx   . Hypertension Neg Hx     Allergies  Allergen Reactions  . Cefdinir Dermatitis  . Codeine Nausea And Vomiting    Current Outpatient Medications on File Prior to Visit  Medication Sig Dispense Refill  . albuterol (PROVENTIL HFA) 108 (90 Base) MCG/ACT inhaler INHALE 2 PUFFS INTO THE LUNGS EVERY 6 HOURS AS NEEDED FOR WHEEZING OR SHORTNESS OF BREATH 6.7 g 2  . ALPRAZolam (XANAX) 0.25 MG tablet TAKE 1 TABLET(0.25 MG) BY MOUTH DAILY AS NEEDED FOR ANXIETY 30 tablet 0  . apixaban (ELIQUIS) 5 MG TABS tablet TAKE 1 TABLET(5 MG) BY MOUTH TWICE DAILY 180 tablet 1  . atorvastatin (LIPITOR) 40 MG tablet TAKE 1 TABLET(40 MG) BY MOUTH DAILY 90 tablet 1  . citalopram (CELEXA) 20 MG tablet TAKE 1 TABLET(20 MG) BY MOUTH DAILY 90 tablet 1  . Cranberry 500 MG CAPS Take 500 mg by mouth daily.    . furosemide (LASIX) 20 MG tablet Take 1 tablet (20 mg total) by mouth every other day. 45 tablet 3  . levothyroxine (SYNTHROID) 75 MCG tablet TAKE 1 TABLET(75 MCG) BY MOUTH DAILY 90 tablet 1  . loratadine (CLARITIN) 10 MG tablet Take 10 mg by mouth daily.    . Multiple Vitamins-Minerals (CENTRUM SILVER PO) Take 1 tablet by mouth daily.    . verapamil (CALAN-SR) 240 MG CR tablet TAKE 1 TABLET BY MOUTH EVERY DAY 25 tablet 2   No current facility-administered medications on file prior to visit.    BP (!) 158/51 (BP Location: Left Arm, Patient Position: Sitting, Cuff Size: Large)   Pulse 66   Temp (!) 97 F (36.1 C) (Temporal)    Resp 18   Ht 5\' 2"  (1.575 m)   Wt 184 lb 6.4 oz (83.6 kg)   SpO2  97%   BMI 33.73 kg/m       Objective:   Physical Exam   General- No acute distress. Pleasant patient. Neck- Full range of motion, no jvd Lungs- Clear, even and unlabored. Heart- regular rate and rhythm. Neurologic- CNII- XII grossly intact. Back- no mid lumbar tenderness. Mild pain on range of motion/changing position. No pain presently on palpation. Pain on straight leg lift. Lower ext- l5-s1 sensation intact.     Assessment & Plan:  For your low back pain region pain for 2 weeks with hx of pain in the past will get xray of lumbar spine.  Will prescribe low dose taper prednisone and low dose flexeril.  Avoid nsaids due to kidney function.  If any skin rash or urinary symptoms let me know.  Back stretching exercises.  Follow up 10 days or as needed  Time spent with patient today was  22  minutes which consisted of chart review, discussing diagnosis, work up treatment and documentation.

## 2019-07-09 ENCOUNTER — Other Ambulatory Visit: Payer: Self-pay

## 2019-07-09 ENCOUNTER — Other Ambulatory Visit: Payer: Self-pay | Admitting: Family Medicine

## 2019-07-09 DIAGNOSIS — I10 Essential (primary) hypertension: Secondary | ICD-10-CM

## 2019-07-09 MED ORDER — ALPRAZOLAM 0.25 MG PO TABS: ORAL_TABLET | ORAL | 0 refills | Status: DC

## 2019-07-09 NOTE — Telephone Encounter (Signed)
Patient called in to see if Dr. Abner Greenspan could send in a prescription for  ALPRAZolam [name] 0.25 MG tablet [[id number]]    Please send it to: [facility]   Address: [address], [place], [place] [address]  Phone number is :  [phone]

## 2019-07-09 NOTE — Telephone Encounter (Signed)
Last written: 06/12/19 Last ov: 03/19/19 Next ov: 07/19/19 Contract: will update at next ov UDS: will update at next ov

## 2019-07-09 NOTE — Telephone Encounter (Signed)
Refill completed.

## 2019-07-10 NOTE — Telephone Encounter (Signed)
Notified pt Rx complete.  She requests that [facility] be removed from her pharmacy list and to start using [facility] listed below. Pharmacy list updated.

## 2019-07-19 ENCOUNTER — Ambulatory Visit: Payer: Medicare Other | Admitting: Family Medicine

## 2019-07-20 IMAGING — CR DG CHEST 2V
2 series · 2 of 2 positions shown · non-contrast
Comparison: 08/05/2014

CLINICAL DATA: AFib

EXAM:
CHEST  2 VIEW

[w chest pa]
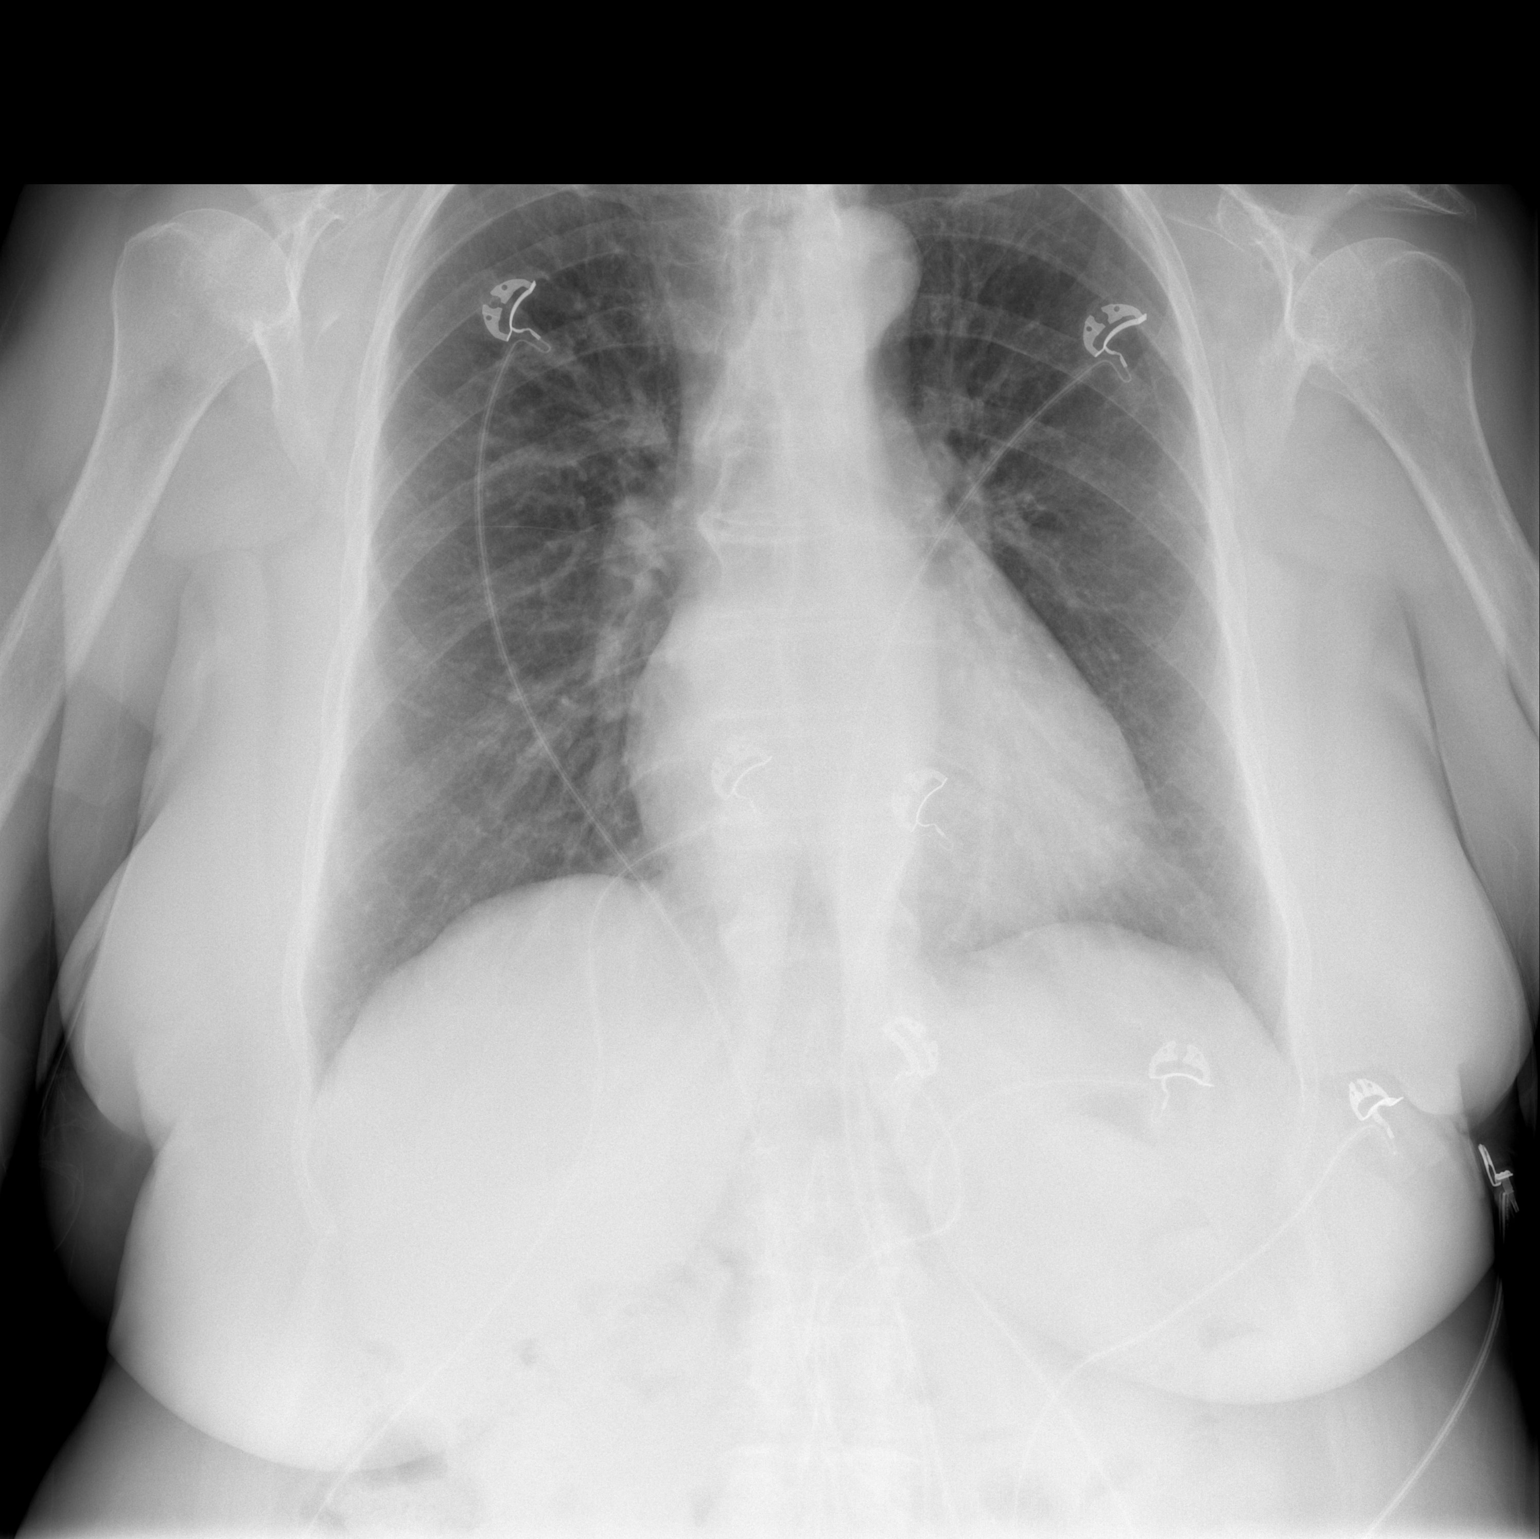

[w chest lat]
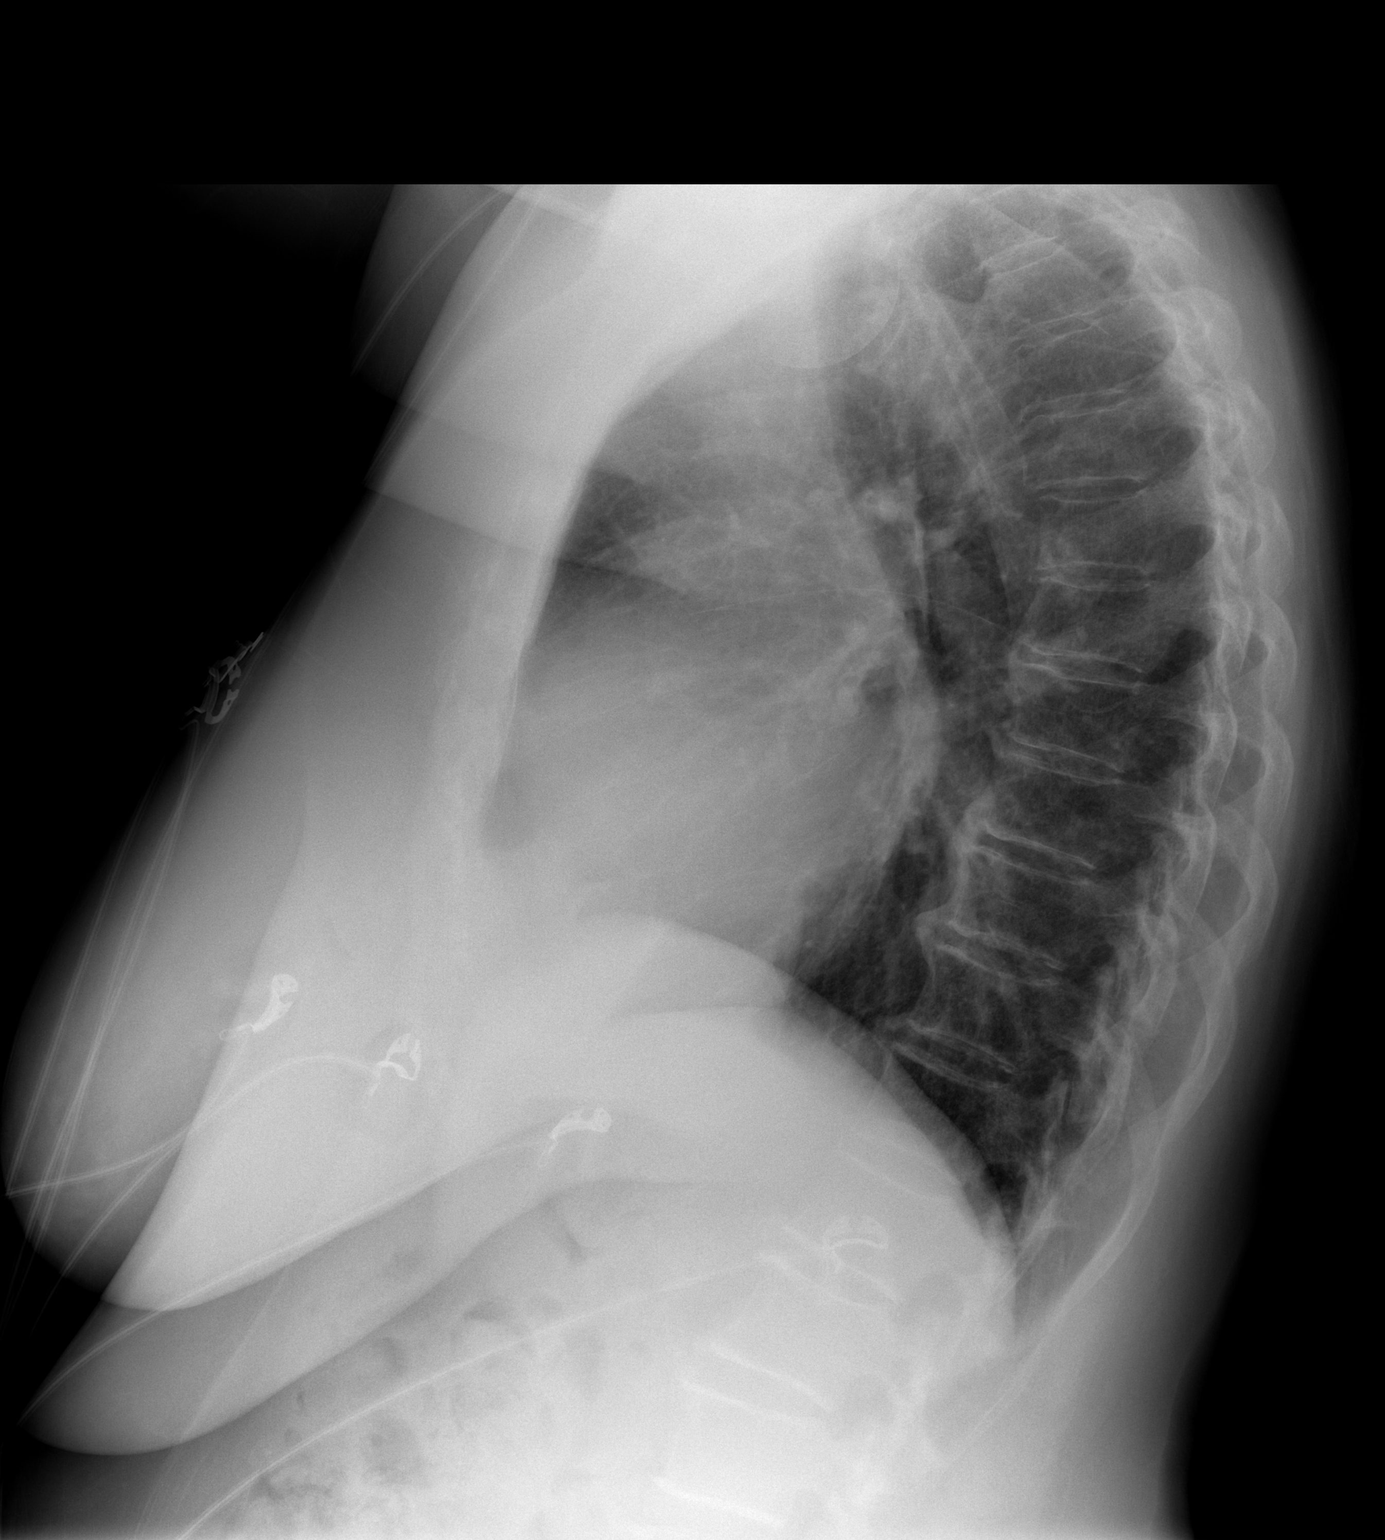

[2 of 2 positions shown; findings below may reference images not displayed]

FINDINGS: The lungs are clear without focal pneumonia, edema, pneumothorax or
pleural effusion. Cardiopericardial silhouette is at upper limits of
normal for size. The visualized bony structures of the thorax are
intact. Telemetry leads overlie the chest.
IMPRESSION: No active cardiopulmonary disease.

## 2019-07-30 ENCOUNTER — Telehealth: Payer: Self-pay

## 2019-07-30 NOTE — Telephone Encounter (Signed)
Patient had question about thyroid medication.  [facility] to [facility] and she got a different brand.  Advised that its a different manufacturer but same medication.

## 2019-07-30 NOTE — Telephone Encounter (Signed)
Patient called in to speak  With the nurse about her medication. Please give the patient a call back at [phone]

## 2019-08-06 ENCOUNTER — Other Ambulatory Visit: Payer: Self-pay

## 2019-08-06 ENCOUNTER — Ambulatory Visit (HOSPITAL_BASED_OUTPATIENT_CLINIC_OR_DEPARTMENT_OTHER)
Admission: RE | Admit: 2019-08-06 | Discharge: 2019-08-06 | Disposition: A | Discharge status: Home / Self Care | Payer: Medicare Other | Source: Ambulatory Visit | Attending: Family | Admitting: Family

## 2019-08-06 ENCOUNTER — Ambulatory Visit (INDEPENDENT_AMBULATORY_CARE_PROVIDER_SITE_OTHER): Payer: Medicare Other | Admitting: Family

## 2019-08-06 ENCOUNTER — Encounter: Payer: Self-pay | Admitting: Family

## 2019-08-06 VITALS — BP 141/46 | HR 60 | Temp 97.9°F | Resp 16 | Ht 62.0 in | Wt 190.0 lb

## 2019-08-06 DIAGNOSIS — I509 Heart failure, unspecified: Secondary | ICD-10-CM

## 2019-08-06 DIAGNOSIS — I10 Essential (primary) hypertension: Secondary | ICD-10-CM | POA: Diagnosis not present

## 2019-08-06 DIAGNOSIS — I5033 Acute on chronic diastolic (congestive) heart failure: Secondary | ICD-10-CM

## 2019-08-06 MED ORDER — FUROSEMIDE 20 MG PO TABS: 20.0000 mg | ORAL_TABLET | Freq: Every day | ORAL | 3 refills | Status: DC

## 2019-08-06 MED ORDER — ALPRAZOLAM 0.25 MG PO TABS: ORAL_TABLET | ORAL | 0 refills | Status: DC

## 2019-08-06 NOTE — Patient Instructions (Addendum)
Please complete lab work prior to leaving. Increase your lasix to one tab by mouth once every day. Complete chest x-ray on the first floor.

## 2019-08-06 NOTE — Progress Notes (Signed)
Subjective:    Patient ID: Alyssa Williamson, female    DOB: Jul 26, 1931, 86 y.o.   MRN: [id number]  HPI  Patient is a 86 year old female who presents today with chief complaint of dyspnea on exertion and bilateral lower extremity edema.  Her past medical history is significant for chronic diastolic heart failure, hypertension, hyperlipidemia, and paroxysmal atrial fibrillation.  She is followed by cardiology and is currently maintained on Lasix 20 mg every other day.  Wt Readings from Last 3 Encounters:  08/06/19 190 lb (86.2 kg)  07/06/19 184 lb 6.4 oz (83.6 kg)  06/20/19 186 lb 12.8 oz (84.7 kg)   She was treated with inhaler, abx, steroids back on 04/08/19.    Reports that sob/swelling started around that time.    Review of Systems See HPI  Past Medical History:  Diagnosis Date  . Allergic rhinitis   . Anemia   . Anxiety   . Arthritis    bilateral knee  . Atrial fibrillation ([facility]) 12/21/2016  . Cervical cancer screening 10/30/2013   Menarche at 15 Regular and moderate flow No history of abnormal pap in past G4P3, s/p 3 svd and 1 MC No history of abnormal MGM Noconcerns today No gyn surgeries  . Constipation 11/18/2015  . Depression with anxiety   . Diverticulitis   . Heart murmur   . Hyperlipidemia   . Hypertension   . Hypothyroid   . Left knee DJD   . Medicare annual wellness visit, subsequent 10/30/2013  . PONV (postoperative nausea and vomiting)   . Preventative health care 05/20/2016     Social History   Socioeconomic History  . Marital status: Widowed    Spouse name: Not on file  . Number of children: 3  . Years of education: Not on file  . Highest education level: Not on file  Occupational History  . Not on file  Tobacco Use  . Smoking status: Never Smoker  . Smokeless tobacco: Never Used  Vaping Use  . Vaping Use: Never used  Substance and Sexual Activity  . Alcohol use: No  . Drug use: No  . Sexual activity: Not on file  Other Topics Concern  . Not on  file  Social History Narrative  . Not on file   Social Determinants of Health   Financial Resource Strain:   . Difficulty of Paying Living Expenses:   Food Insecurity:   . Worried About Programme researcher, broadcasting/film/video in the Last Year:   . Barista in the Last Year:   Transportation Needs:   . Freight forwarder (Medical):   Marland Kitchen Lack of Transportation (Non-Medical):   Physical Activity:   . Days of Exercise per Week:   . Minutes of Exercise per Session:   Stress:   . Feeling of Stress :   Social Connections:   . Frequency of Communication with Friends and Family:   . Frequency of Social Gatherings with Friends and Family:   . Attends Religious Services:   . Active Member of Clubs or Organizations:   . Attends Banker Meetings:   Marland Kitchen Marital Status:   Intimate Partner Violence:   . Fear of Current or Ex-Partner:   . Emotionally Abused:   Marland Kitchen Physically Abused:   . Sexually Abused:     Past Surgical History:  Procedure Laterality Date  . ABDOMINAL HYSTERECTOMY    . bladder tack  04/2010   Dr Marciano Sequin  . GANGLION CYST EXCISION    .  JOINT REPLACEMENT Right   . REPLACEMENT TOTAL KNEE  10/2010   right knee-Murphy   . TONSILLECTOMY    . TOTAL KNEE ARTHROPLASTY Left 07/26/2012   Procedure: TOTAL KNEE ARTHROPLASTY;  Surgeon: Ninetta Lights, MD;  Location: [facility];  Service: Orthopedics;  Laterality: Left;    Family History  Problem Relation Age of Onset  . Alcohol abuse Father   . Breast cancer Sister   . Cancer Sister        bladder  . Glaucoma Sister   . Arthritis Daughter   . Diabetes Daughter        type 2  . Cancer Daughter        brain cancer, diagnosed 54 years.agp  . Mental illness Daughter        s/p craniotomies, gamma knife for tumors. numerous shunts.  . Heart failure Mother   . Cancer Brother        glioblastoma   . Glaucoma Brother   . Diverticulosis Sister   . Cancer Sister        liver  . Cancer Brother        melanoma, mets to lung  and brain and intestine  . Alcohol abuse Brother   . Cancer Brother        liver  . Glaucoma Maternal Grandfather   . Cancer Sister        breast  . Colon cancer Neg Hx   . Hypertension Neg Hx     Allergies  Allergen Reactions  . Cefdinir Dermatitis  . Codeine Nausea And Vomiting    Current Outpatient Medications on File Prior to Visit  Medication Sig Dispense Refill  . albuterol (PROVENTIL HFA) 108 (90 Base) MCG/ACT inhaler INHALE 2 PUFFS INTO THE LUNGS EVERY 6 HOURS AS NEEDED FOR WHEEZING OR SHORTNESS OF BREATH 6.7 g 2  . ALPRAZolam (XANAX) 0.25 MG tablet TAKE 1 TABLET(0.25 MG) BY MOUTH DAILY AS NEEDED FOR ANXIETY 30 tablet 0  . apixaban (ELIQUIS) 5 MG TABS tablet TAKE 1 TABLET(5 MG) BY MOUTH TWICE DAILY 180 tablet 1  . atorvastatin (LIPITOR) 40 MG tablet TAKE 1 TABLET(40 MG) BY MOUTH DAILY 90 tablet 1  . citalopram (CELEXA) 20 MG tablet TAKE 1 TABLET(20 MG) BY MOUTH DAILY 90 tablet 1  . Cranberry 500 MG CAPS Take 500 mg by mouth daily.    . cyclobenzaprine (FLEXERIL) 5 MG tablet Take 1 tablet (5 mg total) by mouth at bedtime. 3 tablet 0  . furosemide (LASIX) 20 MG tablet Take 1 tablet (20 mg total) by mouth every other day. 45 tablet 3  . levothyroxine (SYNTHROID) 75 MCG tablet TAKE 1 TABLET(75 MCG) BY MOUTH DAILY 90 tablet 1  . loratadine (CLARITIN) 10 MG tablet Take 10 mg by mouth daily.    . Multiple Vitamins-Minerals (CENTRUM SILVER PO) Take 1 tablet by mouth daily.    . predniSONE (DELTASONE) 10 MG tablet 4 tab po day 1, 3 tab po day 2, 2 tab po day 3, 1 tab po day 4. 10 tablet 0  . verapamil (CALAN-SR) 240 MG CR tablet TAKE 1 TABLET BY MOUTH EVERY DAY 25 tablet 2   No current facility-administered medications on file prior to visit.    BP (!) 141/46 (BP Location: Left Arm, Patient Position: Sitting, Cuff Size: Large)   Pulse 60   Temp 97.9 F (36.6 C) (Temporal)   Resp 16   Ht 5\' 2"  (1.575 m)   Wt 190 lb (86.2 kg)  SpO2 98%   BMI 34.75 kg/m         Objective:   Physical Exam Constitutional:      Appearance: She is well-developed.  Neck:     Thyroid: No thyromegaly.  Cardiovascular:     Rate and Rhythm: Normal rate and regular rhythm.     Heart sounds: Normal heart sounds. No murmur heard.   Pulmonary:     Effort: Pulmonary effort is normal. No respiratory distress.     Breath sounds: Normal breath sounds. No wheezing.  Musculoskeletal:     Cervical back: Neck supple.     Right lower leg: 3+ Edema present.     Left lower leg: 3+ Edema present.  Skin:    General: Skin is warm and dry.     Comments: Mild chronic venous stasis changes of bilateral shins  Neurological:     Mental Status: She is alert and oriented to person, place, and time.  Psychiatric:        Behavior: Behavior normal.        Thought Content: Thought content normal.        Judgment: Judgment normal.           Assessment & Plan:  Acute on chronic Diastolic CHF-Patient appears volume overloaded. Will increase lasix from QOD to once daily.  Follow up in 1 week.   HTN- BP acceptable.  Monitor.  BP Readings from Last 3 Encounters:  08/06/19 (!) 141/46  07/06/19 (!) 158/51  06/20/19 130/74    This visit occurred during the SARS-CoV-2 public health emergency.  Safety protocols were in place, including screening questions prior to the visit, additional usage of staff PPE, and extensive cleaning of exam room while observing appropriate contact time as indicated for disinfecting solutions.

## 2019-08-07 ENCOUNTER — Other Ambulatory Visit (INDEPENDENT_AMBULATORY_CARE_PROVIDER_SITE_OTHER): Payer: Medicare Other

## 2019-08-07 DIAGNOSIS — I5033 Acute on chronic diastolic (congestive) heart failure: Secondary | ICD-10-CM

## 2019-08-07 LAB — BASIC METABOLIC PANEL
BUN: 23 mg/dL (ref 6–23)
CO2: 28 mEq/L (ref 19–32)
Calcium: 9.1 mg/dL (ref 8.4–10.5)
Chloride: 102 mEq/L (ref 96–112)
Creatinine, Ser: 1.14 mg/dL (ref 0.40–1.20)
GFR: 45.15 mL/min — ABNORMAL LOW (ref >60.00)
Glucose, Bld: 84 mg/dL (ref 70–99)
Potassium: 4.3 mEq/L (ref 3.5–5.1)
Sodium: 136 mEq/L (ref 135–145)

## 2019-08-07 LAB — BRAIN NATRIURETIC PEPTIDE: Pro B Natriuretic peptide (BNP): 280 pg/mL — ABNORMAL HIGH (ref 0.0–100.0)

## 2019-08-13 ENCOUNTER — Encounter: Payer: Self-pay | Admitting: Family

## 2019-08-13 ENCOUNTER — Other Ambulatory Visit: Payer: Self-pay

## 2019-08-13 ENCOUNTER — Ambulatory Visit (INDEPENDENT_AMBULATORY_CARE_PROVIDER_SITE_OTHER): Payer: Medicare Other | Admitting: Family

## 2019-08-13 VITALS — BP 155/55 | HR 59 | Temp 97.2°F | Resp 16 | Wt 187.0 lb

## 2019-08-13 DIAGNOSIS — I5033 Acute on chronic diastolic (congestive) heart failure: Secondary | ICD-10-CM | POA: Diagnosis not present

## 2019-08-13 LAB — BASIC METABOLIC PANEL
BUN: 27 mg/dL — ABNORMAL HIGH (ref 6–23)
CO2: 29 mEq/L (ref 19–32)
Calcium: 8.9 mg/dL (ref 8.4–10.5)
Chloride: 101 mEq/L (ref 96–112)
Creatinine, Ser: 1.21 mg/dL — ABNORMAL HIGH (ref 0.40–1.20)
GFR: 42.15 mL/min — ABNORMAL LOW (ref >60.00)
Glucose, Bld: 135 mg/dL — ABNORMAL HIGH (ref 70–99)
Potassium: 4.2 mEq/L (ref 3.5–5.1)
Sodium: 138 mEq/L (ref 135–145)

## 2019-08-13 MED ORDER — ALBUTEROL SULFATE HFA 108 (90 BASE) MCG/ACT IN AERS: INHALATION_SPRAY | RESPIRATORY_TRACT | 2 refills | Status: DC

## 2019-08-13 NOTE — Progress Notes (Signed)
Subjective:    Patient ID: Alyssa Williamson, female    DOB: 1931-04-18, 86 y.o.   MRN: [id number]  HPI   Patient is an 86 yr old female who presents today for follow up of her acute on chronic diastolic heart failure.   Wt Readings from Last 3 Encounters:  08/13/19 187 lb (84.8 kg)  08/06/19 190 lb (86.2 kg)  07/06/19 184 lb 6.4 oz (83.6 kg)   Last visit she c/o SOB and LE edema. We increased her lasix from QOD to QD.  She reports improvement in her LE edema as well as resolution of her SOB.     Review of Systems See HPI  Past Medical History:  Diagnosis Date  . Allergic rhinitis   . Anemia   . Anxiety   . Arthritis    bilateral knee  . Atrial fibrillation (HCC) 12/21/2016  . Cervical cancer screening 10/30/2013   Menarche at 15 Regular and moderate flow No history of abnormal pap in past G4P3, s/p 3 svd and 1 MC No history of abnormal MGM Noconcerns today No gyn surgeries  . Constipation 11/18/2015  . Depression with anxiety   . Diverticulitis   . Heart murmur   . Hyperlipidemia   . Hypertension   . Hypothyroid   . Left knee DJD   . Medicare annual wellness visit, subsequent 10/30/2013  . PONV (postoperative nausea and vomiting)   . Preventative health care 05/20/2016     Social History   Socioeconomic History  . Marital status: Widowed    Spouse name: Not on file  . Number of children: 3  . Years of education: Not on file  . Highest education level: Not on file  Occupational History  . Not on file  Tobacco Use  . Smoking status: Never Smoker  . Smokeless tobacco: Never Used  Vaping Use  . Vaping Use: Never used  Substance and Sexual Activity  . Alcohol use: No  . Drug use: No  . Sexual activity: Not on file  Other Topics Concern  . Not on file  Social History Narrative  . Not on file   Social Determinants of Health   Financial Resource Strain:   . Difficulty of Paying Living Expenses:   Food Insecurity:   . Worried About Programme researcher, broadcasting/film/video in the  Last Year:   . Barista in the Last Year:   Transportation Needs:   . Freight forwarder (Medical):   Marland Kitchen Lack of Transportation (Non-Medical):   Physical Activity:   . Days of Exercise per Week:   . Minutes of Exercise per Session:   Stress:   . Feeling of Stress :   Social Connections:   . Frequency of Communication with Friends and Family:   . Frequency of Social Gatherings with Friends and Family:   . Attends Religious Services:   . Active Member of Clubs or Organizations:   . Attends Banker Meetings:   Marland Kitchen Marital Status:   Intimate Partner Violence:   . Fear of Current or Ex-Partner:   . Emotionally Abused:   Marland Kitchen Physically Abused:   . Sexually Abused:     Past Surgical History:  Procedure Laterality Date  . ABDOMINAL HYSTERECTOMY    . bladder tack  04/2010   Dr Marciano Sequin  . GANGLION CYST EXCISION    . JOINT REPLACEMENT Right   . REPLACEMENT TOTAL KNEE  10/2010   right knee-Murphy   . TONSILLECTOMY    .  TOTAL KNEE ARTHROPLASTY Left 07/26/2012   Procedure: TOTAL KNEE ARTHROPLASTY;  Surgeon: Loreta Ave, MD;  Location: [facility] OR;  Service: Orthopedics;  Laterality: Left;    Family History  Problem Relation Age of Onset  . Alcohol abuse Father   . Breast cancer Sister   . Cancer Sister        bladder  . Glaucoma Sister   . Arthritis Daughter   . Diabetes Daughter        type 2  . Cancer Daughter        brain cancer, diagnosed 29 years.agp  . Mental illness Daughter        s/p craniotomies, gamma knife for tumors. numerous shunts.  . Heart failure Mother   . Cancer Brother        glioblastoma   . Glaucoma Brother   . Diverticulosis Sister   . Cancer Sister        liver  . Cancer Brother        melanoma, mets to lung and brain and intestine  . Alcohol abuse Brother   . Cancer Brother        liver  . Glaucoma Maternal Grandfather   . Cancer Sister        breast  . Colon cancer Neg Hx   . Hypertension Neg Hx     Allergies    Allergen Reactions  . Cefdinir Dermatitis  . Codeine Nausea And Vomiting    Current Outpatient Medications on File Prior to Visit  Medication Sig Dispense Refill  . albuterol (PROVENTIL HFA) 108 (90 Base) MCG/ACT inhaler INHALE 2 PUFFS INTO THE LUNGS EVERY 6 HOURS AS NEEDED FOR WHEEZING OR SHORTNESS OF BREATH 6.7 g 2  . ALPRAZolam (XANAX) 0.25 MG tablet TAKE 1 TABLET(0.25 MG) BY MOUTH DAILY AS NEEDED FOR ANXIETY 30 tablet 0  . apixaban (ELIQUIS) 5 MG TABS tablet TAKE 1 TABLET(5 MG) BY MOUTH TWICE DAILY 180 tablet 1  . atorvastatin (LIPITOR) 40 MG tablet TAKE 1 TABLET(40 MG) BY MOUTH DAILY 90 tablet 1  . citalopram (CELEXA) 20 MG tablet TAKE 1 TABLET(20 MG) BY MOUTH DAILY 90 tablet 1  . Cranberry 500 MG CAPS Take 500 mg by mouth daily.    . cyclobenzaprine (FLEXERIL) 5 MG tablet Take 1 tablet (5 mg total) by mouth at bedtime. 3 tablet 0  . furosemide (LASIX) 20 MG tablet Take 1 tablet (20 mg total) by mouth daily. 30 tablet 3  . levothyroxine (SYNTHROID) 75 MCG tablet TAKE 1 TABLET(75 MCG) BY MOUTH DAILY 90 tablet 1  . loratadine (CLARITIN) 10 MG tablet Take 10 mg by mouth daily.    . Multiple Vitamins-Minerals (CENTRUM SILVER PO) Take 1 tablet by mouth daily.    . predniSONE (DELTASONE) 10 MG tablet 4 tab po day 1, 3 tab po day 2, 2 tab po day 3, 1 tab po day 4. 10 tablet 0  . verapamil (CALAN-SR) 240 MG CR tablet TAKE 1 TABLET BY MOUTH EVERY DAY 25 tablet 2   No current facility-administered medications on file prior to visit.    BP (!) 155/55 (BP Location: Left Arm, Patient Position: Sitting, Cuff Size: Small)   Pulse (!) 59   Temp (!) 97.2 F (36.2 C) (Temporal)   Resp 16   Wt 187 lb (84.8 kg)   SpO2 100%   BMI 34.20 kg/m       Objective:   Physical Exam Constitutional:      Appearance: She is well-developed.  Cardiovascular:     Rate and Rhythm: Normal rate and regular rhythm.     Heart sounds: Normal heart sounds. No murmur heard.   Pulmonary:     Effort:  Pulmonary effort is normal. No respiratory distress.     Breath sounds: Normal breath sounds. No wheezing.  Musculoskeletal:     Right lower leg: 1+ Edema present.     Left lower leg: 1+ Edema present.  Psychiatric:        Behavior: Behavior normal.        Thought Content: Thought content normal.        Judgment: Judgment normal.           Assessment & Plan:  Acute on chronic diastolic chf- improved.  Continue lasix 20mg  once daily. Obtain follow up bmet. Pt is advised as follows:   Weigh yourself daily.  Call us if you gain 3 pounds overnight or 5 pounds in 1 week.    This visit occurred during the SARS-CoV-2 public health emergency.  Safety protocols were in place, including screening questions prior to the visit, additional usage of staff PPE, and extensive cleaning of exam room while observing appropriate contact time as indicated for disinfecting solutions.

## 2019-08-13 NOTE — Patient Instructions (Addendum)
Please continue lasix once daily. Complete lab work prior to leaving. Weigh yourself daily.  Call us if you gain 3 pounds overnight or 5 pounds in 1 week.

## 2019-08-20 NOTE — Progress Notes (Signed)
Mailed out to pt 

## 2019-08-31 ENCOUNTER — Other Ambulatory Visit: Payer: Self-pay | Admitting: Family

## 2019-08-31 DIAGNOSIS — I10 Essential (primary) hypertension: Secondary | ICD-10-CM

## 2019-08-31 NOTE — Telephone Encounter (Signed)
Requesting: xanax 0.5 mg Contract:N/A UDS:12/21/2016 Last Visit:08/13/19 Next Visit:12/25/2019 Last Refill:08/06/2019  Please Advise

## 2019-09-03 ENCOUNTER — Other Ambulatory Visit: Payer: Self-pay | Admitting: Family Medicine

## 2019-10-03 ENCOUNTER — Other Ambulatory Visit: Payer: Self-pay | Admitting: Family Medicine

## 2019-10-03 DIAGNOSIS — I10 Essential (primary) hypertension: Secondary | ICD-10-CM

## 2019-10-03 NOTE — Telephone Encounter (Signed)
Requesting: xanax Contract:n/a UDS: n/a Last Visit:08/13/19 Next Visit:12/25/19 Last Refill:08/31/19  Please Advise

## 2019-10-26 ENCOUNTER — Other Ambulatory Visit: Payer: Self-pay | Admitting: Family Medicine

## 2019-11-01 ENCOUNTER — Other Ambulatory Visit: Payer: Self-pay | Admitting: Family Medicine

## 2019-11-01 DIAGNOSIS — I10 Essential (primary) hypertension: Secondary | ICD-10-CM

## 2019-11-02 NOTE — Telephone Encounter (Signed)
Requesting: xanax Contract: n/a UDS: n/a Last Visit:08/13/19 Next Visit:12/25/19 Last Refill:10/03/19  Please Advise

## 2019-11-06 ENCOUNTER — Telehealth: Payer: Self-pay | Admitting: Family Medicine

## 2019-11-06 NOTE — Telephone Encounter (Signed)
Patient states she needs cataract surgery. She would like a referral and to know who Dr. Abner Greenspan would recommend within [facility].

## 2019-11-07 ENCOUNTER — Other Ambulatory Visit: Payer: Self-pay | Admitting: Family Medicine

## 2019-11-07 DIAGNOSIS — H269 Unspecified cataract: Secondary | ICD-10-CM

## 2019-11-07 NOTE — Telephone Encounter (Signed)
Called [facility] office and yes, they do cataract surgeries there, and have spoken with patient and it's okay to obtain a referral.

## 2019-11-07 NOTE — Telephone Encounter (Signed)
Referral placed.

## 2019-11-07 NOTE — Telephone Encounter (Signed)
I use Dr Laruth Bouchard office for opthamology care a lot. Please confirm they actually do the cataract surgeries themselves and let the patient know. If they do not do the surgeries themselves then confirm whom they refer to and let patient know. We can refer if patient agrees.

## 2019-11-19 ENCOUNTER — Other Ambulatory Visit: Payer: Self-pay | Admitting: Cardiology

## 2019-11-19 NOTE — Telephone Encounter (Signed)
Please review for refill, thanks ! 

## 2019-11-19 NOTE — Telephone Encounter (Signed)
Pt's age 86, wt 84.8 kg, SCr 1.21, CrCl  52.56, last ov w/ [facility] 06/20/19.

## 2019-11-20 ENCOUNTER — Other Ambulatory Visit: Payer: Self-pay | Admitting: Cardiology

## 2019-11-20 NOTE — Telephone Encounter (Signed)
*  STAT* If patient is at the pharmacy, call can be transferred to refill team.   1. Which medications need to be refilled? (please list name of each medication and dose if known) Eliquis   2. Which pharmacy/location (including street and city if local pharmacy) is medication to be sent to? [facility] [phone]  3. Do they need a 30 day or 90 day supply? 30 patient also have a coming apt

## 2019-11-21 MED ORDER — APIXABAN 5 MG PO TABS: 5.0000 mg | ORAL_TABLET | Freq: Two times a day (BID) | ORAL | 1 refills | Status: DC

## 2019-11-28 ENCOUNTER — Other Ambulatory Visit: Payer: Self-pay | Admitting: Family Medicine

## 2019-11-28 DIAGNOSIS — I10 Essential (primary) hypertension: Secondary | ICD-10-CM

## 2019-11-28 NOTE — Telephone Encounter (Signed)
Last written:11/02/19 Last ov:08/13/19 Next ov:12/25/19 Contract: none UDS:12/28/16

## 2019-11-30 ENCOUNTER — Other Ambulatory Visit: Payer: Self-pay | Admitting: Family Medicine

## 2019-11-30 DIAGNOSIS — I10 Essential (primary) hypertension: Secondary | ICD-10-CM

## 2019-11-30 NOTE — Telephone Encounter (Deleted)
Last written: Last ov: Next ov: Contract: UDS:;  

## 2019-11-30 NOTE — Telephone Encounter (Signed)
Medication approved on 11/28/19

## 2019-12-03 ENCOUNTER — Other Ambulatory Visit: Payer: Self-pay | Admitting: Family Medicine

## 2019-12-03 ENCOUNTER — Telehealth: Payer: Self-pay | Admitting: Family Medicine

## 2019-12-03 DIAGNOSIS — I10 Essential (primary) hypertension: Secondary | ICD-10-CM

## 2019-12-03 MED ORDER — ALPRAZOLAM 0.25 MG PO TABS: 0.2500 mg | ORAL_TABLET | Freq: Every day | ORAL | 0 refills | Status: DC | PRN

## 2019-12-03 NOTE — Telephone Encounter (Signed)
Medication: ALPRAZolam (XANAX) 0.25 MG tablet [[id number]      Has the patient contacted their pharmacy?  (If no, request that the patient contact the pharmacy for the refill.) (If yes, when and what did the pharmacy advise?)     Preferred Pharmacy (with phone number or street name): [facility] [address] [place], [place] - [address]  [address] [place] [address]  Phone:  [phone] Fax:  [phone]      Agent: Please be advised that RX refills may take up to 3 business days. We ask that you follow-up with your pharmacy.

## 2019-12-03 NOTE — Telephone Encounter (Signed)
Attempted to contact to f/u on Rx request. No answer alert forwarded to pcp to order control    Last written:07/2019 Last ov:08/06/19 Next ov:11/220/21 Contract: [id number]

## 2019-12-03 NOTE — Telephone Encounter (Signed)
Called pt home to f/u on request medication was sent to pharmacy on 10/6 /21

## 2019-12-03 NOTE — Telephone Encounter (Signed)
I sent in her Alprazolam she has an appointment in a couple of weeks

## 2019-12-03 NOTE — Telephone Encounter (Signed)
Rx from 11/28/19 did not go thru can you please resend to pharmacy.   Last written:07/2019 Last ov:08/06/19 Next ov:11/220/21 Contract: [id number]

## 2019-12-17 ENCOUNTER — Other Ambulatory Visit: Payer: Self-pay | Admitting: Family Medicine

## 2019-12-17 ENCOUNTER — Other Ambulatory Visit: Payer: Self-pay | Admitting: Family

## 2019-12-17 DIAGNOSIS — I509 Heart failure, unspecified: Secondary | ICD-10-CM

## 2019-12-25 ENCOUNTER — Other Ambulatory Visit: Payer: Self-pay

## 2019-12-25 ENCOUNTER — Encounter: Payer: Self-pay | Admitting: Family Medicine

## 2019-12-25 ENCOUNTER — Ambulatory Visit (INDEPENDENT_AMBULATORY_CARE_PROVIDER_SITE_OTHER): Payer: Medicare Other | Admitting: Family Medicine

## 2019-12-25 VITALS — BP 126/72 | HR 58 | Temp 98.1°F | Resp 16 | Wt 187.2 lb

## 2019-12-25 DIAGNOSIS — E039 Hypothyroidism, unspecified: Secondary | ICD-10-CM | POA: Diagnosis not present

## 2019-12-25 DIAGNOSIS — R739 Hyperglycemia, unspecified: Secondary | ICD-10-CM | POA: Diagnosis not present

## 2019-12-25 DIAGNOSIS — F419 Anxiety disorder, unspecified: Secondary | ICD-10-CM

## 2019-12-25 DIAGNOSIS — I10 Essential (primary) hypertension: Secondary | ICD-10-CM | POA: Diagnosis not present

## 2019-12-25 DIAGNOSIS — E7800 Pure hypercholesterolemia, unspecified: Secondary | ICD-10-CM

## 2019-12-25 DIAGNOSIS — Z23 Encounter for immunization: Secondary | ICD-10-CM | POA: Diagnosis not present

## 2019-12-25 DIAGNOSIS — I4891 Unspecified atrial fibrillation: Secondary | ICD-10-CM

## 2019-12-25 NOTE — Assessment & Plan Note (Signed)
Follows with Dr Jens Som and sees him next month.

## 2019-12-25 NOTE — Progress Notes (Signed)
Subjective:    Patient ID: Alyssa Williamson, female    DOB: 1931-04-28, 86 y.o.   MRN: [id number]  Chief Complaint  Patient presents with  . Follow-up  . Hypertension    HPI Patient is in today for follow up on chronic medical concerns.  She denies any recent febrile illness or hospitalizations although she did note a significant illness back in February.  She went to urgent care with congestion fatigue malaise was treated with antibiotic and the congestion improved but she felt excessively tired for a couple of weeks.  This is all resolved.  She is using less alprazolam as she is managing the stress of caring for her daughter better these days.  No recent other acute concerns.  She is trying to stay active and maintain a heart healthy diet.  Is tolerating her medications. Denies CP/palp/SOB/HA/congestion/fevers/GI or GU c/o. Taking meds as prescribed  Past Medical History:  Diagnosis Date  . Allergic rhinitis   . Anemia   . Anxiety   . Arthritis    bilateral knee  . Atrial fibrillation (HCC) 12/21/2016  . Cervical cancer screening 10/30/2013   Menarche at 15 Regular and moderate flow No history of abnormal pap in past G4P3, s/p 3 svd and 1 MC No history of abnormal MGM Noconcerns today No gyn surgeries  . Constipation 11/18/2015  . Depression with anxiety   . Diverticulitis   . Heart murmur   . Hyperlipidemia   . Hypertension   . Hypothyroid   . Left knee DJD   . Medicare annual wellness visit, subsequent 10/30/2013  . PONV (postoperative nausea and vomiting)   . Preventative health care 05/20/2016    Past Surgical History:  Procedure Laterality Date  . ABDOMINAL HYSTERECTOMY    . bladder tack  04/2010   Dr Marciano Sequinimothy  [place]  . GANGLION CYST EXCISION    . JOINT REPLACEMENT Right   . REPLACEMENT TOTAL KNEE  10/2010   right knee-Murphy   . TONSILLECTOMY    . TOTAL KNEE ARTHROPLASTY Left 07/26/2012   Procedure: TOTAL KNEE ARTHROPLASTY;  Surgeon: Loreta Aveaniel F Murphy, MD;  Location: [facility] OR;   Service: Orthopedics;  Laterality: Left;    Family History  Problem Relation Age of Onset  . Alcohol abuse Father   . Breast cancer Sister   . Cancer Sister        bladder  . Glaucoma Sister   . Arthritis Daughter   . Diabetes Daughter        type 2  . Cancer Daughter        brain cancer, diagnosed 29 years.agp  . Mental illness Daughter        s/p craniotomies, gamma knife for tumors. numerous shunts.  . Heart failure Mother   . Cancer Brother        glioblastoma   . Glaucoma Brother   . Diverticulosis Sister   . Cancer Sister        liver  . Cancer Brother        melanoma, mets to lung and brain and intestine  . Alcohol abuse Brother   . Cancer Brother        liver  . Glaucoma Maternal Grandfather   . Cancer Sister        breast  . Colon cancer Neg Hx   . Hypertension Neg Hx     Social History   Socioeconomic History  . Marital status: Widowed    Spouse name: Not on file  .  Number of children: 3  . Years of education: Not on file  . Highest education level: Not on file  Occupational History  . Not on file  Tobacco Use  . Smoking status: Never Smoker  . Smokeless tobacco: Never Used  Vaping Use  . Vaping Use: Never used  Substance and Sexual Activity  . Alcohol use: No  . Drug use: No  . Sexual activity: Not on file  Other Topics Concern  . Not on file  Social History Narrative  . Not on file   Social Determinants of Health   Financial Resource Strain:   . Difficulty of Paying Living Expenses: Not on file  Food Insecurity:   . Worried About Programme researcher, broadcasting/film/video in the Last Year: Not on file  . Ran Out of Food in the Last Year: Not on file  Transportation Needs:   . Lack of Transportation (Medical): Not on file  . Lack of Transportation (Non-Medical): Not on file  Physical Activity:   . Days of Exercise per Week: Not on file  . Minutes of Exercise per Session: Not on file  Stress:   . Feeling of Stress : Not on file  Social Connections:   .  Frequency of Communication with Friends and Family: Not on file  . Frequency of Social Gatherings with Friends and Family: Not on file  . Attends Religious Services: Not on file  . Active Member of Clubs or Organizations: Not on file  . Attends Banker Meetings: Not on file  . Marital Status: Not on file  Intimate Partner Violence:   . Fear of Current or Ex-Partner: Not on file  . Emotionally Abused: Not on file  . Physically Abused: Not on file  . Sexually Abused: Not on file    Outpatient Medications Prior to Visit  Medication Sig Dispense Refill  . albuterol (PROVENTIL HFA) 108 (90 Base) MCG/ACT inhaler INHALE 2 PUFFS INTO THE LUNGS EVERY 6 HOURS AS NEEDED FOR WHEEZING OR SHORTNESS OF BREATH 6.7 g 2  . ALPRAZolam (XANAX) 0.25 MG tablet Take 1 tablet (0.25 mg total) by mouth daily as needed for anxiety. 30 tablet 0  . apixaban (ELIQUIS) 5 MG TABS tablet Take 1 tablet (5 mg total) by mouth 2 (two) times daily. 180 tablet 1  . atorvastatin (LIPITOR) 40 MG tablet TAKE 1 TABLET(40 MG) BY MOUTH DAILY 90 tablet 1  . citalopram (CELEXA) 20 MG tablet Take 1 tablet (20 mg total) by mouth daily. 90 tablet 1  . Cranberry 500 MG CAPS Take 500 mg by mouth daily.    . furosemide (LASIX) 20 MG tablet Take 1 tablet by mouth once daily 30 tablet 0  . levothyroxine (EUTHYROX) 75 MCG tablet Take 1 tablet (75 mcg total) by mouth daily before breakfast. 90 tablet 1  . loratadine (CLARITIN) 10 MG tablet Take 10 mg by mouth daily.    . Multiple Vitamins-Minerals (CENTRUM SILVER PO) Take 1 tablet by mouth daily.    . predniSONE (DELTASONE) 10 MG tablet 4 tab po day 1, 3 tab po day 2, 2 tab po day 3, 1 tab po day 4. 10 tablet 0  . verapamil (CALAN-SR) 240 MG CR tablet Take 1 tablet by mouth once daily 90 tablet 0  . cyclobenzaprine (FLEXERIL) 5 MG tablet Take 1 tablet (5 mg total) by mouth at bedtime. 3 tablet 0   No facility-administered medications prior to visit.    Allergies  Allergen  Reactions  . Cefdinir  Dermatitis  . Codeine Nausea And Vomiting    Review of Systems  Constitutional: Negative for fever and malaise/fatigue.  HENT: Negative for congestion.   Eyes: Negative for blurred vision.  Respiratory: Negative for shortness of breath.   Cardiovascular: Negative for chest pain, palpitations and leg swelling.  Gastrointestinal: Negative for abdominal pain, blood in stool and nausea.  Genitourinary: Negative for dysuria and frequency.  Musculoskeletal: Negative for falls.  Skin: Negative for rash.  Neurological: Negative for dizziness, loss of consciousness and headaches.  Endo/Heme/Allergies: Negative for environmental allergies.  Psychiatric/Behavioral: Negative for depression. The patient is nervous/anxious.        Objective:    Physical Exam Vitals and nursing note reviewed.  Constitutional:      General: She is not in acute distress.    Appearance: She is well-developed.  HENT:     Head: Normocephalic and atraumatic.     Nose: Nose normal.  Eyes:     General:        Right eye: No discharge.        Left eye: No discharge.  Cardiovascular:     Rate and Rhythm: Normal rate and regular rhythm.     Heart sounds: No murmur heard.   Pulmonary:     Effort: Pulmonary effort is normal.     Breath sounds: Normal breath sounds.  Abdominal:     General: Bowel sounds are normal.     Palpations: Abdomen is soft.     Tenderness: There is no abdominal tenderness.  Musculoskeletal:     Cervical back: Normal range of motion and neck supple.  Skin:    General: Skin is warm and dry.  Neurological:     Mental Status: She is alert and oriented to person, place, and time.     BP 126/72   Pulse (!) 58   Temp 98.1 F (36.7 C)   Resp 16   Wt 187 lb 3.2 oz (84.9 kg)   SpO2 96%   BMI 34.24 kg/m  Wt Readings from Last 3 Encounters:  12/25/19 187 lb 3.2 oz (84.9 kg)  08/13/19 187 lb (84.8 kg)  08/06/19 190 lb (86.2 kg)    Diabetic Foot Exam - Simple     No data filed     Lab Results  Component Value Date   WBC 6.4 03/19/2019   HGB 13.8 03/19/2019   HCT 41.0 03/19/2019   PLT 196.0 03/19/2019   GLUCOSE 135 (H) 08/13/2019   CHOL 149 03/19/2019   TRIG 72.0 03/19/2019   HDL 60.60 03/19/2019   LDLCALC 74 03/19/2019   ALT 19 03/19/2019   AST 21 03/19/2019   NA 138 08/13/2019   K 4.2 08/13/2019   CL 101 08/13/2019   CREATININE 1.21 (H) 08/13/2019   BUN 27 (H) 08/13/2019   CO2 29 08/13/2019   TSH 0.72 03/19/2019   INR 0.99 07/24/2012   HGBA1C 5.8 03/19/2019    Lab Results  Component Value Date   TSH 0.72 03/19/2019   Lab Results  Component Value Date   WBC 6.4 03/19/2019   HGB 13.8 03/19/2019   HCT 41.0 03/19/2019   MCV 98.1 03/19/2019   PLT 196.0 03/19/2019   Lab Results  Component Value Date   NA 138 08/13/2019   K 4.2 08/13/2019   CO2 29 08/13/2019   GLUCOSE 135 (H) 08/13/2019   BUN 27 (H) 08/13/2019   CREATININE 1.21 (H) 08/13/2019   BILITOT 0.9 03/19/2019   ALKPHOS 85 03/19/2019  AST 21 03/19/2019   ALT 19 03/19/2019   PROT 6.2 03/19/2019   ALBUMIN 3.7 03/19/2019   CALCIUM 8.9 08/13/2019   ANIONGAP 8 11/08/2016   GFR 42.15 (L) 08/13/2019   Lab Results  Component Value Date   CHOL 149 03/19/2019   Lab Results  Component Value Date   HDL 60.60 03/19/2019   Lab Results  Component Value Date   LDLCALC 74 03/19/2019   Lab Results  Component Value Date   TRIG 72.0 03/19/2019   Lab Results  Component Value Date   CHOLHDL 2 03/19/2019   Lab Results  Component Value Date   HGBA1C 5.8 03/19/2019       Assessment & Plan:   Problem List Items Addressed This Visit    Hypothyroidism    On Levothyroxine, continue to monitor      Relevant Orders   TSH   HTN (hypertension)    Well controlled, no changes to meds. Encouraged heart healthy diet such as the DASH diet and exercise as tolerated.       Relevant Orders   CBC   Comprehensive metabolic panel   TSH   Hyperlipidemia     Encouraged heart healthy diet, increase exercise, avoid trans fats, consider a krill oil cap daily. Tolerating Atorvastatin      Relevant Orders   Lipid panel   Anxiety    She continues to struggle with her daughter with mental illness, likely narcissistic personality. Dennie Bible is very mean to her and she used to let it upset her more but she is doing better with that now. She is using less Alprazolam 1/2 tab daily prn       Hyperglycemia    hgba1c acceptable, minimize simple carbs. Increase exercise as tolerated.      Relevant Orders   Hemoglobin A1c   Atrial fibrillation (HCC)    Follows with Dr Jens Som and sees him next month.        Other Visit Diagnoses    Needs flu shot    -  Primary   Relevant Orders   Flu Vaccine QUAD High Dose(Fluad) (Completed)      I have discontinued Delorise Shiner E. Dornfeld's cyclobenzaprine. I am also having her maintain her Multiple Vitamins-Minerals (CENTRUM SILVER PO), Cranberry, loratadine, atorvastatin, predniSONE, albuterol, citalopram, levothyroxine, apixaban, ALPRAZolam, furosemide, and verapamil.  No orders of the defined types were placed in this encounter.    Danise Edge, MD

## 2019-12-25 NOTE — Assessment & Plan Note (Signed)
On Levothyroxine, continue to monitor 

## 2019-12-25 NOTE — Assessment & Plan Note (Signed)
Encouraged heart healthy diet, increase exercise, avoid trans fats, consider a krill oil cap daily. Tolerating Atorvastatin 

## 2019-12-25 NOTE — Assessment & Plan Note (Signed)
hgba1c acceptable, minimize simple carbs. Increase exercise as tolerated.  

## 2019-12-25 NOTE — Assessment & Plan Note (Signed)
Well controlled, no changes to meds. Encouraged heart healthy diet such as the DASH diet and exercise as tolerated.  °

## 2019-12-25 NOTE — Assessment & Plan Note (Addendum)
She continues to struggle with her daughter with mental illness, likely narcissistic personality. Dennie Bible is very mean to her and she used to let it upset her more but she is doing better with that now. She is using less Alprazolam 1/2 tab daily prn

## 2019-12-25 NOTE — Patient Instructions (Signed)
Hypothyroidism  Hypothyroidism is when the thyroid gland does not make enough of certain hormones (it is underactive). The thyroid gland is a small gland located in the lower front part of the neck, just in front of the windpipe (trachea). This gland makes hormones that help control how the body uses food for energy (metabolism) as well as how the heart and brain function. These hormones also play a role in keeping your bones strong. When the thyroid is underactive, it produces too little of the hormones thyroxine (T4) and triiodothyronine (T3). What are the causes? This condition may be caused by:  Hashimoto's disease. This is a disease in which the body's disease-fighting system (immune system) attacks the thyroid gland. This is the most common cause.  Viral infections.  Pregnancy.  Certain medicines.  Birth defects.  Past radiation treatments to the head or neck for cancer.  Past treatment with radioactive iodine.  Past exposure to radiation in the environment.  Past surgical removal of part or all of the thyroid.  Problems with a gland in the center of the brain (pituitary gland).  Lack of enough iodine in the diet. What increases the risk? You are more likely to develop this condition if:  You are female.  You have a family history of thyroid conditions.  You use a medicine called lithium.  You take medicines that affect the immune system (immunosuppressants). What are the signs or symptoms? Symptoms of this condition include:  Feeling as though you have no energy (lethargy).  Not being able to tolerate cold.  Weight gain that is not explained by a change in diet or exercise habits.  Lack of appetite.  Dry skin.  Coarse hair.  Menstrual irregularity.  Slowing of thought processes.  Constipation.  Sadness or depression. How is this diagnosed? This condition may be diagnosed based on:  Your symptoms, your medical history, and a physical exam.  Blood  tests. You may also have imaging tests, such as an ultrasound or MRI. How is this treated? This condition is treated with medicine that replaces the thyroid hormones that your body does not make. After you begin treatment, it may take several weeks for symptoms to go away. Follow these instructions at home:  Take over-the-counter and prescription medicines only as told by your health care provider.  If you start taking any new medicines, tell your health care provider.  Keep all follow-up visits as told by your health care provider. This is important. ? As your condition improves, your dosage of thyroid hormone medicine may change. ? You will need to have blood tests regularly so that your health care provider can monitor your condition. Contact a health care provider if:  Your symptoms do not get better with treatment.  You are taking thyroid replacement medicine and you: ? Sweat a lot. ? Have tremors. ? Feel anxious. ? Lose weight rapidly. ? Cannot tolerate heat. ? Have emotional swings. ? Have diarrhea. ? Feel weak. Get help right away if you have:  Chest pain.  An irregular heartbeat.  A rapid heartbeat.  Difficulty breathing. Summary  Hypothyroidism is when the thyroid gland does not make enough of certain hormones (it is underactive).  When the thyroid is underactive, it produces too little of the hormones thyroxine (T4) and triiodothyronine (T3).  The most common cause is Hashimoto's disease, a disease in which the body's disease-fighting system (immune system) attacks the thyroid gland. The condition can also be caused by viral infections, medicine, pregnancy, or past   radiation treatment to the head or neck.  Symptoms may include weight gain, dry skin, constipation, feeling as though you do not have energy, and not being able to tolerate cold.  This condition is treated with medicine to replace the thyroid hormones that your body does not make. This information  is not intended to replace advice given to you by your health care provider. Make sure you discuss any questions you have with your health care provider. Document Revised: 01/21/2017 Document Reviewed: 01/19/2017 Elsevier Patient Education  2020 Elsevier Inc.  

## 2019-12-26 LAB — COMPREHENSIVE METABOLIC PANEL
AG Ratio: 1.5 (calc) (ref 1.0–2.5)
ALT: 17 U/L (ref 6–29)
AST: 23 U/L (ref 10–35)
Albumin: 4 g/dL (ref 3.6–5.1)
Alkaline phosphatase (APISO): 78 U/L (ref 37–153)
BUN/Creatinine Ratio: 19 (calc) (ref 6–22)
BUN: 24 mg/dL (ref 7–25)
CO2: 30 mmol/L (ref 20–32)
Calcium: 9.4 mg/dL (ref 8.6–10.4)
Chloride: 99 mmol/L (ref 98–110)
Creat: 1.25 mg/dL — ABNORMAL HIGH (ref 0.60–0.88)
Globulin: 2.6 g/dL (calc) (ref 1.9–3.7)
Glucose, Bld: 87 mg/dL (ref 65–99)
Potassium: 4.8 mmol/L (ref 3.5–5.3)
Sodium: 137 mmol/L (ref 135–146)
Total Bilirubin: 1 mg/dL (ref 0.2–1.2)
Total Protein: 6.6 g/dL (ref 6.1–8.1)

## 2019-12-26 LAB — CBC
HCT: 42.8 % (ref 35.0–45.0)
Hemoglobin: 14.3 g/dL (ref 11.7–15.5)
MCH: 32.1 pg (ref 27.0–33.0)
MCHC: 33.4 g/dL (ref 32.0–36.0)
MCV: 96.2 fL (ref 80.0–100.0)
MPV: 10.9 fL (ref 7.5–12.5)
Platelets: 199 10*3/uL (ref 140–400)
RBC: 4.45 10*6/uL (ref 3.80–5.10)
RDW: 11.9 % (ref 11.0–15.0)
WBC: 7.5 10*3/uL (ref 3.8–10.8)

## 2019-12-26 LAB — LIPID PANEL
Cholesterol: 159 mg/dL (ref <200)
HDL: 67 mg/dL (ref >=50)
LDL Cholesterol (Calc): 75 mg/dL (calc)
Non-HDL Cholesterol (Calc): 92 mg/dL (calc) (ref <130)
Total CHOL/HDL Ratio: 2.4 (calc) (ref <5.0)
Triglycerides: 83 mg/dL (ref <150)

## 2019-12-26 LAB — HEMOGLOBIN A1C
Hgb A1c MFr Bld: 5.6 % of total Hgb (ref <5.7)
Mean Plasma Glucose: 114 (calc)
eAG (mmol/L): 6.3 (calc)

## 2019-12-26 LAB — TSH: TSH: 1.15 mIU/L (ref 0.40–4.50)

## 2019-12-26 NOTE — Progress Notes (Signed)
Attempted to call pt and LVM to call back 

## 2020-01-03 ENCOUNTER — Telehealth: Payer: Self-pay

## 2020-01-03 ENCOUNTER — Telehealth: Payer: Self-pay | Admitting: Family Medicine

## 2020-01-03 ENCOUNTER — Other Ambulatory Visit: Payer: Self-pay | Admitting: Family Medicine

## 2020-01-03 DIAGNOSIS — I10 Essential (primary) hypertension: Secondary | ICD-10-CM

## 2020-01-03 MED ORDER — ALPRAZOLAM 0.25 MG PO TABS: 0.2500 mg | ORAL_TABLET | Freq: Every day | ORAL | 1 refills | Status: DC | PRN

## 2020-01-03 NOTE — Telephone Encounter (Signed)
Requesting:ALPRAZolam (XANAX) 0.25 MG tablet Contract:12/21/16 UDS:12/21/16 Last Visit:12/25/19 Next Visit:02/06/20 Last Refill:12/03/19  Please Advise

## 2020-01-03 NOTE — Telephone Encounter (Signed)
Medication:ALPRAZolam (XANAX) 0.25 MG tablet  Has the patient contacted their pharmacy? No. (If no, request that the patient contact the pharmacy for the refill.) (If yes, when and what did the pharmacy advise?)  Preferred Pharmacy (with phone number or street name): [facility] [address] [place], [place] - [address]  [address], [place] [address]  Phone:  [phone] Fax:  [phone]  DEA #:  --  Agent: Please be advised that RX refills may take up to 3 business days. We ask that you follow-up with your pharmacy.

## 2020-01-29 ENCOUNTER — Other Ambulatory Visit: Payer: Self-pay | Admitting: Family Medicine

## 2020-01-29 DIAGNOSIS — I509 Heart failure, unspecified: Secondary | ICD-10-CM

## 2020-01-29 NOTE — Progress Notes (Signed)
HPI: FUatrial fibrillation.Nuclear study May 2014 showed breast attenuation but no ischemia. Ejection fraction 79%. Noted to be in new onset atrial 9/18. Seen in atrial fibrillation clinic; treated with rate control and anticoagulation. TSH normal.  Echocardiogram May 2021 showed ejection fraction 60 to 65%, moderate left atrial enlargement, mild mitral regurgitation.  Since last seen,patient states that since January when she had a viral syndrome she has noticed wheezing with walking long distances.  There is no orthopnea or PND.  Minimal pedal edema.  No chest pain, palpitations or syncope.  No bleeding.  Current Outpatient Medications  Medication Sig Dispense Refill   albuterol (PROVENTIL HFA) 108 (90 Base) MCG/ACT inhaler INHALE 2 PUFFS INTO THE LUNGS EVERY 6 HOURS AS NEEDED FOR WHEEZING OR SHORTNESS OF BREATH 6.7 g 2   ALPRAZolam (XANAX) 0.25 MG tablet Take 1 tablet (0.25 mg total) by mouth daily as needed for anxiety. 30 tablet 1   apixaban (ELIQUIS) 5 MG TABS tablet Take 1 tablet (5 mg total) by mouth 2 (two) times daily. 180 tablet 1   atorvastatin (LIPITOR) 40 MG tablet TAKE 1 TABLET(40 MG) BY MOUTH DAILY 90 tablet 1   citalopram (CELEXA) 20 MG tablet Take 1 tablet (20 mg total) by mouth daily. 90 tablet 1   Cranberry 500 MG CAPS Take 500 mg by mouth daily.     furosemide (LASIX) 20 MG tablet Take 1 tablet (20 mg total) by mouth daily. 90 tablet 1   levothyroxine (EUTHYROX) 75 MCG tablet Take 1 tablet (75 mcg total) by mouth daily before breakfast. 90 tablet 1   loratadine (CLARITIN) 10 MG tablet Take 10 mg by mouth daily.     Multiple Vitamins-Minerals (CENTRUM SILVER PO) Take 1 tablet by mouth daily.     verapamil (CALAN-SR) 240 MG CR tablet Take 1 tablet by mouth once daily 90 tablet 0   No current facility-administered medications for this visit.     Past Medical History:  Diagnosis Date   Allergic rhinitis    Anemia    Anxiety    Arthritis     bilateral knee   Atrial fibrillation (HCC) 12/21/2016   Cervical cancer screening 10/30/2013   Menarche at 15 Regular and moderate flow No history of abnormal pap in past G4P3, s/p 3 svd and 1 MC No history of abnormal MGM Noconcerns today No gyn surgeries   Constipation 11/18/2015   Depression with anxiety    Diverticulitis    Heart murmur    Hyperlipidemia    Hypertension    Hypothyroid    Left knee DJD    Medicare annual wellness visit, subsequent 10/30/2013   PONV (postoperative nausea and vomiting)    Preventative health care 05/20/2016    Past Surgical History:  Procedure Laterality Date   ABDOMINAL HYSTERECTOMY     bladder tack  04/2010   Dr Marciano Sequin   GANGLION CYST EXCISION     JOINT REPLACEMENT Right    REPLACEMENT TOTAL KNEE  10/2010   right knee-Murphy    TONSILLECTOMY     TOTAL KNEE ARTHROPLASTY Left 07/26/2012   Procedure: TOTAL KNEE ARTHROPLASTY;  Surgeon: Loreta Ave, MD;  Location: [facility] OR;  Service: Orthopedics;  Laterality: Left;    Social History   Socioeconomic History   Marital status: Widowed    Spouse name: Not on file   Number of children: 3   Years of education: Not on file   Highest education level: Not on file  Occupational History   Not on file  Tobacco Use   Smoking status: Never Smoker   Smokeless tobacco: Never Used  Vaping Use   Vaping Use: Never used  Substance and Sexual Activity   Alcohol use: No   Drug use: No   Sexual activity: Not on file  Other Topics Concern   Not on file  Social History Narrative   Not on file   Social Determinants of Health   Financial Resource Strain: Not on file  Food Insecurity: Not on file  Transportation Needs: Not on file  Physical Activity: Not on file  Stress: Not on file  Social Connections: Not on file  Intimate Partner Violence: Not on file    Family History  Problem Relation Age of Onset   Alcohol abuse Father    Breast cancer Sister    Cancer  Sister        bladder   Glaucoma Sister    Arthritis Daughter    Diabetes Daughter        type 2   Cancer Daughter        brain cancer, diagnosed 29 years.agp   Mental illness Daughter        s/p craniotomies, gamma knife for tumors. numerous shunts.   Heart failure Mother    Cancer Brother        glioblastoma    Glaucoma Brother    Diverticulosis Sister    Cancer Sister        liver   Cancer Brother        melanoma, mets to lung and brain and intestine   Alcohol abuse Brother    Cancer Brother        liver   Glaucoma Maternal Grandfather    Cancer Sister        breast   Colon cancer Neg Hx    Hypertension Neg Hx     ROS: no fevers or chills, productive cough, hemoptysis, dysphasia, odynophagia, melena, hematochezia, dysuria, hematuria, rash, seizure activity, orthopnea, PND, pedal edema, claudication. Remaining systems are negative.  Physical Exam: Well-developed well-nourished in no acute distress.  Skin is warm and dry.  HEENT is normal.  Neck is supple.  Chest is clear to auscultation with normal expansion.  Cardiovascular exam is regular rate and rhythm.  Abdominal exam nontender or distended. No masses palpated. Extremities show no edema. neuro grossly intact  ECG-sinus bradycardia at a rate of 58, nonspecific ST changes.  Personally reviewed  A/P  1 paroxysmal atrial fibrillation-patient is in sinus rhythm.  Continue verapamil for rate control if atrial fibrillation recurs.  Continue apixaban.    2 chronic diastolic congestive heart failure-she appears to be euvolemic.  Continue Lasix at present dose.  She does note some increased dyspnea on exertion.  Check BNP.  If elevated may need to increase Lasix slightly.  3 hypertension-blood pressure is controlled.  Continue present medications and follow.  4 hyperlipidemia-continue statin.  [name], MD

## 2020-02-06 ENCOUNTER — Encounter: Payer: Self-pay | Admitting: Cardiology

## 2020-02-06 ENCOUNTER — Other Ambulatory Visit: Payer: Self-pay

## 2020-02-06 ENCOUNTER — Ambulatory Visit (INDEPENDENT_AMBULATORY_CARE_PROVIDER_SITE_OTHER): Payer: Medicare Other | Admitting: Cardiology

## 2020-02-06 VITALS — BP 144/64 | HR 60 | Ht 62.0 in | Wt 185.0 lb

## 2020-02-06 DIAGNOSIS — I509 Heart failure, unspecified: Secondary | ICD-10-CM

## 2020-02-06 DIAGNOSIS — I480 Paroxysmal atrial fibrillation: Secondary | ICD-10-CM | POA: Diagnosis not present

## 2020-02-06 DIAGNOSIS — E7800 Pure hypercholesterolemia, unspecified: Secondary | ICD-10-CM

## 2020-02-06 DIAGNOSIS — I10 Essential (primary) hypertension: Secondary | ICD-10-CM

## 2020-02-06 NOTE — Patient Instructions (Signed)

## 2020-02-07 LAB — PRO B NATRIURETIC PEPTIDE: NT-Pro BNP: 822 pg/mL — ABNORMAL HIGH (ref 0–738)

## 2020-02-25 ENCOUNTER — Other Ambulatory Visit: Payer: Self-pay | Admitting: Family Medicine

## 2020-03-03 ENCOUNTER — Telehealth: Payer: Self-pay

## 2020-03-03 ENCOUNTER — Other Ambulatory Visit: Payer: Self-pay | Admitting: Family Medicine

## 2020-03-03 DIAGNOSIS — I10 Essential (primary) hypertension: Secondary | ICD-10-CM

## 2020-03-03 MED ORDER — ALPRAZOLAM 0.25 MG PO TABS: 0.2500 mg | ORAL_TABLET | Freq: Every day | ORAL | 1 refills | Status: DC | PRN

## 2020-03-03 MED ORDER — ALBUTEROL SULFATE HFA 108 (90 BASE) MCG/ACT IN AERS: INHALATION_SPRAY | RESPIRATORY_TRACT | 2 refills | Status: DC

## 2020-03-03 NOTE — Telephone Encounter (Signed)
Requesting:alprazolam 0.25mg  tablet Contract:12/21/2016 UDS:12/21/2016 Last Visit:12/25/2019 Next Visit:06/26/2020 Last Refill:01/03/2020  Please Advise  And she also needs inhaler refilled

## 2020-03-03 NOTE — Telephone Encounter (Signed)
Pt called stating she needs refills on Alprazolam & Albuterol inhaler.  Pharmacy is [facility].

## 2020-03-25 ENCOUNTER — Other Ambulatory Visit: Payer: Self-pay | Admitting: Family Medicine

## 2020-04-21 ENCOUNTER — Telehealth: Payer: Self-pay | Admitting: Cardiology

## 2020-04-21 ENCOUNTER — Emergency Department (HOSPITAL_BASED_OUTPATIENT_CLINIC_OR_DEPARTMENT_OTHER)
Admission: EM | Admit: 2020-04-21 | Discharge: 2020-04-21 | Disposition: A | Discharge status: Home / Self Care | Payer: Medicare Other | Attending: Emergency Medicine | Admitting: Emergency Medicine

## 2020-04-21 ENCOUNTER — Emergency Department (HOSPITAL_BASED_OUTPATIENT_CLINIC_OR_DEPARTMENT_OTHER): Payer: Medicare Other

## 2020-04-21 ENCOUNTER — Other Ambulatory Visit: Payer: Self-pay

## 2020-04-21 DIAGNOSIS — R062 Wheezing: Secondary | ICD-10-CM | POA: Diagnosis not present

## 2020-04-21 DIAGNOSIS — R059 Cough, unspecified: Secondary | ICD-10-CM | POA: Diagnosis not present

## 2020-04-21 DIAGNOSIS — I10 Essential (primary) hypertension: Secondary | ICD-10-CM | POA: Insufficient documentation

## 2020-04-21 DIAGNOSIS — Z96653 Presence of artificial knee joint, bilateral: Secondary | ICD-10-CM | POA: Insufficient documentation

## 2020-04-21 DIAGNOSIS — Z7901 Long term (current) use of anticoagulants: Secondary | ICD-10-CM | POA: Insufficient documentation

## 2020-04-21 DIAGNOSIS — Z20822 Contact with and (suspected) exposure to covid-19: Secondary | ICD-10-CM | POA: Insufficient documentation

## 2020-04-21 DIAGNOSIS — Z79899 Other long term (current) drug therapy: Secondary | ICD-10-CM | POA: Diagnosis not present

## 2020-04-21 DIAGNOSIS — E039 Hypothyroidism, unspecified: Secondary | ICD-10-CM | POA: Diagnosis not present

## 2020-04-21 DIAGNOSIS — R0602 Shortness of breath: Secondary | ICD-10-CM | POA: Diagnosis present

## 2020-04-21 LAB — COMPREHENSIVE METABOLIC PANEL
ALT: 20 U/L (ref 0–44)
AST: 28 U/L (ref 15–41)
Albumin: 3.6 g/dL (ref 3.5–5.0)
Alkaline Phosphatase: 80 U/L (ref 38–126)
Anion gap: 9 (ref 5–15)
BUN: 18 mg/dL (ref 8–23)
CO2: 27 mmol/L (ref 22–32)
Calcium: 8.4 mg/dL — ABNORMAL LOW (ref 8.9–10.3)
Chloride: 98 mmol/L (ref 98–111)
Creatinine, Ser: 1.06 mg/dL — ABNORMAL HIGH (ref 0.44–1.00)
GFR, Estimated: 51 mL/min — ABNORMAL LOW (ref >60)
Glucose, Bld: 118 mg/dL — ABNORMAL HIGH (ref 70–99)
Potassium: 3.9 mmol/L (ref 3.5–5.1)
Sodium: 134 mmol/L — ABNORMAL LOW (ref 135–145)
Total Bilirubin: 0.4 mg/dL (ref 0.3–1.2)
Total Protein: 6.8 g/dL (ref 6.5–8.1)

## 2020-04-21 LAB — CBC WITH DIFFERENTIAL/PLATELET
Abs Immature Granulocytes: 0.02 10*3/uL (ref 0.00–0.07)
Basophils Absolute: 0 10*3/uL (ref 0.0–0.1)
Basophils Relative: 0 %
Eosinophils Absolute: 0.1 10*3/uL (ref 0.0–0.5)
Eosinophils Relative: 2 %
HCT: 40.1 % (ref 36.0–46.0)
Hemoglobin: 13.8 g/dL (ref 12.0–15.0)
Immature Granulocytes: 0 %
Lymphocytes Relative: 16 %
Lymphs Abs: 0.7 10*3/uL (ref 0.7–4.0)
MCH: 32.7 pg (ref 26.0–34.0)
MCHC: 34.4 g/dL (ref 30.0–36.0)
MCV: 95 fL (ref 80.0–100.0)
Monocytes Absolute: 0.8 10*3/uL (ref 0.1–1.0)
Monocytes Relative: 18 %
Neutro Abs: 2.8 10*3/uL (ref 1.7–7.7)
Neutrophils Relative %: 64 %
Platelets: 172 10*3/uL (ref 150–400)
RBC: 4.22 MIL/uL (ref 3.87–5.11)
RDW: 12.6 % (ref 11.5–15.5)
WBC: 4.5 10*3/uL (ref 4.0–10.5)
nRBC: 0 % (ref 0.0–0.2)

## 2020-04-21 LAB — BRAIN NATRIURETIC PEPTIDE: B Natriuretic Peptide: 248.5 pg/mL — ABNORMAL HIGH (ref 0.0–100.0)

## 2020-04-21 LAB — TROPONIN I (HIGH SENSITIVITY): Troponin I (High Sensitivity): 7 ng/L (ref <18)

## 2020-04-21 LAB — SARS CORONAVIRUS 2 (TAT 6-24 HRS): SARS Coronavirus 2: NEGATIVE

## 2020-04-21 MED ORDER — FUROSEMIDE 10 MG/ML IJ SOLN
40.0000 mg | Freq: Once | INTRAMUSCULAR | Status: AC
  Administered 2020-04-21: 40 mg via INTRAVENOUS
  Filled 2020-04-21: qty 4

## 2020-04-21 NOTE — Discharge Instructions (Addendum)
Recommend that you take additional dose of Lasix for the next 3 days. Please take 20 mg twice a day. Follow-up with your primary care doctor and cardiology. Follow-up your COVID test that has it also ordered.

## 2020-04-21 NOTE — ED Notes (Signed)
Pt on monitor and vitals cycling 

## 2020-04-21 NOTE — Telephone Encounter (Signed)
Received a call into triage regarding pt being audibly SOB over the phone. Nurse spoke who report recently getting over a virus but since have been really short of breath. Pt state she had a COVID test that was negative but feels something is going on. Pt denies swelling or any other symptoms but having to stop and take breaths between sentences, and audibly wheezing.   Nurse advised pt report urgent care or ER for further evaluations. Pt verbalized understanding.

## 2020-04-21 NOTE — ED Notes (Signed)
RT assessed patient in triage. Stated she has had wheezing in the past, but more so with this virus she has. Stated she tested negative for covid this AM. Stated she has a sore throat, and throat sounds pretty swollen. No distress and SAT 95% on RA. RT to monitor.

## 2020-04-21 NOTE — ED Triage Notes (Signed)
Pt been complaining of wheezing x several months. States was sick last week and now is worse with shortness of breath. C/o productive cough.

## 2020-04-21 NOTE — Telephone Encounter (Signed)
Pt c/o Shortness Of Breath: STAT if SOB developed within the last 24 hours or pt is noticeably SOB on the phone  1. Are you currently SOB (can you hear that pt is SOB on the phone)? Yes I can hear patient   2. How long have you been experiencing SOB? Since her last visit it has gotten worse and patient is wheezy   3. Are you SOB when sitting or when up moving around? Moving around  4. Are you currently experiencing any other symptoms? No patient would like to see some one as well

## 2020-04-21 NOTE — ED Provider Notes (Signed)
[facility] EMERGENCY DEPARTMENT Provider Note   CSN: [id number] Arrival date & time: 04/21/20  1037     History Chief Complaint  Patient presents with  . Shortness of Breath    Alyssa Williamson is a 87 y.o. female.  The history is provided by the patient.  Shortness of Breath Severity:  Mild Onset quality:  Gradual Timing:  Intermittent Progression:  Waxing and waning Chronicity:  Recurrent Context: URI   Relieved by:  Nothing Worsened by:  Nothing Associated symptoms: cough and wheezing   Associated symptoms: no abdominal pain, no chest pain, no ear pain, no fever, no rash, no sore throat and no vomiting   Risk factors: no hx of PE/DVT        Past Medical History:  Diagnosis Date  . Allergic rhinitis   . Anemia   . Anxiety   . Arthritis    bilateral knee  . Atrial fibrillation (HCC) 12/21/2016  . Cervical cancer screening 10/30/2013   Menarche at 15 Regular and moderate flow No history of abnormal pap in past G4P3, s/p 3 svd and 1 MC No history of abnormal MGM Noconcerns today No gyn surgeries  . Constipation 11/18/2015  . Depression with anxiety   . Diverticulitis   . Heart murmur   . Hyperlipidemia   . Hypertension   . Hypothyroid   . Left knee DJD   . Medicare annual wellness visit, subsequent 10/30/2013  . PONV (postoperative nausea and vomiting)   . Preventative health care 05/20/2016    Patient Active Problem List   Diagnosis Date Noted  . Fall 07/10/2017  . PND (post-nasal drip) 04/04/2017  . Hypocalcemia 12/26/2016  . Atrial fibrillation (HCC) 12/21/2016  . Preventative health care 05/20/2016  . Constipation 11/18/2015  . History of thyroid nodule 07/24/2014  . Allergic rhinitis 07/05/2014  . Atypical chest pain 11/04/2013  . Other malaise and fatigue 10/30/2013  . Medicare annual wellness visit, subsequent 10/30/2013  . Cervical cancer screening 10/30/2013  . Hyperglycemia 05/23/2013  . Hyponatremia 07/28/2012  . Diverticulitis   .  Arthritis   . Heart murmur   . Anxiety   . Anemia   . Intertrigo 06/07/2012  . Arthritis of knee 05/24/2012  . Hypothyroidism 06/27/2011  . HTN (hypertension) 06/27/2011  . Hyperlipidemia 06/27/2011  . Recurrent UTI 06/27/2011  . Murmur 06/27/2011    Past Surgical History:  Procedure Laterality Date  . ABDOMINAL HYSTERECTOMY    . bladder tack  04/2010   Dr Marciano Sequin  . GANGLION CYST EXCISION    . JOINT REPLACEMENT Right   . REPLACEMENT TOTAL KNEE  10/2010   right knee-Murphy   . TONSILLECTOMY    . TOTAL KNEE ARTHROPLASTY Left 07/26/2012   Procedure: TOTAL KNEE ARTHROPLASTY;  Surgeon: Loreta Ave, MD;  Location: [facility] OR;  Service: Orthopedics;  Laterality: Left;     OB History   No obstetric history on file.     Family History  Problem Relation Age of Onset  . Alcohol abuse Father   . Breast cancer Sister   . Cancer Sister        bladder  . Glaucoma Sister   . Arthritis Daughter   . Diabetes Daughter        type 2  . Cancer Daughter        brain cancer, diagnosed 29 years.agp  . Mental illness Daughter        s/p craniotomies, gamma knife for tumors. numerous shunts.  Marland Kitchen  Heart failure Mother   . Cancer Brother        glioblastoma   . Glaucoma Brother   . Diverticulosis Sister   . Cancer Sister        liver  . Cancer Brother        melanoma, mets to lung and brain and intestine  . Alcohol abuse Brother   . Cancer Brother        liver  . Glaucoma Maternal Grandfather   . Cancer Sister        breast  . Colon cancer Neg Hx   . Hypertension Neg Hx     Social History   Tobacco Use  . Smoking status: Never Smoker  . Smokeless tobacco: Never Used  Vaping Use  . Vaping Use: Never used  Substance Use Topics  . Alcohol use: No  . Drug use: No    Home Medications Prior to Admission medications   Medication Sig Start Date End Date Taking? Authorizing Provider  albuterol (PROVENTIL HFA) 108 (90 Base) MCG/ACT inhaler INHALE 2 PUFFS INTO THE LUNGS  EVERY 6 HOURS AS NEEDED FOR WHEEZING OR SHORTNESS OF BREATH 03/03/20   Bradd CanaryBlyth, Stacey A, MD  ALPRAZolam Prudy Feeler(XANAX) 0.25 MG tablet Take 1 tablet (0.25 mg total) by mouth daily as needed for anxiety. 03/03/20   Bradd CanaryBlyth, Stacey A, MD  apixaban (ELIQUIS) 5 MG TABS tablet Take 1 tablet (5 mg total) by mouth 2 (two) times daily. 11/21/19   Lewayne Buntingrenshaw, Brian S, MD  atorvastatin (LIPITOR) 40 MG tablet Take 1 tablet (40 mg total) by mouth daily. 02/25/20   Bradd CanaryBlyth, Stacey A, MD  citalopram (CELEXA) 20 MG tablet Take 1 tablet (20 mg total) by mouth daily. 03/25/20   Bradd CanaryBlyth, Stacey A, MD  Cranberry 500 MG CAPS Take 500 mg by mouth daily.    [provider]  furosemide (LASIX) 20 MG tablet Take 1 tablet (20 mg total) by mouth daily. 01/29/20   Bradd CanaryBlyth, Stacey A, MD  levothyroxine (EUTHYROX) 75 MCG tablet Take 1 tablet (75 mcg total) by mouth daily before breakfast. 10/26/19   Bradd CanaryBlyth, Stacey A, MD  loratadine (CLARITIN) 10 MG tablet Take 10 mg by mouth daily.    [provider]  Multiple Vitamins-Minerals (CENTRUM SILVER PO) Take 1 tablet by mouth daily.    [provider]  verapamil (CALAN-SR) 240 MG CR tablet Take 1 tablet (240 mg total) by mouth daily. 03/25/20   Bradd CanaryBlyth, Stacey A, MD    Allergies    Cefdinir and Codeine  Review of Systems   Review of Systems  Constitutional: Negative for chills and fever.  HENT: Negative for ear pain and sore throat.   Eyes: Negative for pain and visual disturbance.  Respiratory: Positive for cough, shortness of breath and wheezing.   Cardiovascular: Positive for leg swelling. Negative for chest pain and palpitations.  Gastrointestinal: Negative for abdominal pain, nausea and vomiting.  Genitourinary: Negative for dysuria and hematuria.  Musculoskeletal: Negative for arthralgias and back pain.  Skin: Negative for color change and rash.  Neurological: Negative for seizures and syncope.  All other systems reviewed and are negative.   Physical Exam Updated Vital  Signs BP (!) 156/59   Pulse 60   Temp 98.5 F (36.9 C) (Oral)   Resp 20   Ht 5\' 2"  (1.575 m)   Wt 82.6 kg   SpO2 97%   BMI 33.29 kg/m   Physical Exam Vitals and nursing note reviewed.  Constitutional:  General: She is not in acute distress.    Appearance: She is well-developed and well-nourished. She is not ill-appearing.  HENT:     Head: Normocephalic and atraumatic.     Mouth/Throat:     Mouth: Mucous membranes are moist.  Eyes:     Conjunctiva/sclera: Conjunctivae normal.     Pupils: Pupils are equal, round, and reactive to light.  Cardiovascular:     Rate and Rhythm: Normal rate and regular rhythm.     Pulses: Normal pulses.     Heart sounds: Normal heart sounds. No murmur heard.   Pulmonary:     Effort: Pulmonary effort is normal. No respiratory distress.     Breath sounds: Wheezing (very slight end expiratory wheeze?) present. No decreased breath sounds, rhonchi or rales.  Abdominal:     Palpations: Abdomen is soft.     Tenderness: There is no abdominal tenderness.  Musculoskeletal:     Cervical back: Normal range of motion and neck supple.     Right lower leg: Edema (trace) present.     Left lower leg: Edema (trace) present.  Skin:    General: Skin is warm and dry.     Capillary Refill: Capillary refill takes less than 2 seconds.  Neurological:     General: No focal deficit present.     Mental Status: She is alert.  Psychiatric:        Mood and Affect: Mood and affect normal.     ED Results / Procedures / Treatments   Labs (all labs ordered are listed, but only abnormal results are displayed) Labs Reviewed  COMPREHENSIVE METABOLIC PANEL - Abnormal; Notable for the following components:      Result Value   Sodium 134 (*)    Glucose, Bld 118 (*)    Creatinine, Ser 1.06 (*)    Calcium 8.4 (*)    GFR, Estimated 51 (*)    All other components within normal limits  BRAIN NATRIURETIC PEPTIDE - Abnormal; Notable for the following components:   B  Natriuretic Peptide 248.5 (*)    All other components within normal limits  SARS CORONAVIRUS 2 (TAT 6-24 HRS)  CBC WITH DIFFERENTIAL/PLATELET  TROPONIN I (HIGH SENSITIVITY)    EKG EKG Interpretation  Date/Time:  Monday April 21 2020 11:12:48 EST Ventricular Rate:  66 PR Interval:    QRS Duration: 75 QT Interval:  423 QTC Calculation: 437 R Axis:   39 Text Interpretation: Sinus rhythm Low voltage, precordial leads Nonspecific T abnormalities, diffuse leads Confirmed by Virgina Norfolk [id number]) on 04/21/2020 11:13:57 AM   Radiology DG Chest Portable 1 View  Result Date: 04/21/2020 CLINICAL DATA:  Shortness of breath for several months EXAM: PORTABLE CHEST 1 VIEW COMPARISON:  08/06/2019 FINDINGS: Cardiac shadow is mildly enlarged but accentuated by the frontal technique. The lungs are well aerated bilaterally. No focal infiltrate or sizable effusion is seen. No acute bony abnormality is noted. IMPRESSION: No acute abnormality noted. Electronically Signed   By: Alcide Clever M.D.   On: 04/21/2020 11:06    Procedures Procedures   Medications Ordered in ED Medications  furosemide (LASIX) injection 40 mg (40 mg Intravenous Given 04/21/20 1228)    ED Course  I have reviewed the triage vital signs and the nursing notes.  Pertinent labs & imaging results that were available during my care of the patient were reviewed by me and considered in my medical decision making (see chart for details).    MDM Rules/Calculators/A&P  CERINA LEARY is an 87 year old female with history of A. fib on blood thinner, hypertension, high cholesterol presents the ED with shortness of breath, cough. Patient with overall normal vitals. No fever. Patient with symptoms for over past week. Had negative Covid test at home today. Has some mild swelling in her legs. She has fairly dry cough. Feels like she is wheezing at times. Denies any chest pain. KG shows sinus rhythm. No ischemic  changes. Troponin normal. Doubt ACS as she is not having any chest pain. No ischemic changes on EKG. Chest x-ray shows no pneumonia, no volume overload, no pneumothorax. BNP mildly elevated at 248. No significant leukocytosis, anemia, electrolyte abnormality. She is on blood thinner and also doubt PE. Suspect that this could be some mild volume overload may be from pulmonary hypertension mild heart failure. We will give her dose of IV Lasix here and have her double her dose of home Lasix for the next several days until she can follow-up with primary care and cardiology. Will test for Covid. Less likely COPD or asthma as no history of the same but states that she has used inhaler in the past for this and she has 1 and recommend that she continue to take inhaler as needed. Understands return precautions and discharged to the ED in good condition. Overall normal vitals. No signs of respiratory distress.  This chart was dictated using voice recognition software.  Despite best efforts to proofread,  errors can occur which can change the documentation meaning.    Final Clinical Impression(s) / ED Diagnoses Final diagnoses:  Shortness of breath    Rx / DC Orders ED Discharge Orders    None       Virgina Norfolk, DO 04/21/20 1246

## 2020-04-25 ENCOUNTER — Ambulatory Visit (INDEPENDENT_AMBULATORY_CARE_PROVIDER_SITE_OTHER): Payer: Medicare Other | Admitting: Medical

## 2020-04-25 ENCOUNTER — Other Ambulatory Visit: Payer: Self-pay

## 2020-04-25 ENCOUNTER — Ambulatory Visit (HOSPITAL_BASED_OUTPATIENT_CLINIC_OR_DEPARTMENT_OTHER)
Admission: RE | Admit: 2020-04-25 | Discharge: 2020-04-25 | Disposition: A | Discharge status: Home / Self Care | Payer: Medicare Other | Source: Ambulatory Visit | Attending: Medical | Admitting: Medical

## 2020-04-25 ENCOUNTER — Encounter: Payer: Self-pay | Admitting: Medical

## 2020-04-25 VITALS — BP 142/70 | HR 72 | Temp 97.7°F | Resp 22 | Ht 62.0 in | Wt 188.8 lb

## 2020-04-25 DIAGNOSIS — R0600 Dyspnea, unspecified: Secondary | ICD-10-CM

## 2020-04-25 DIAGNOSIS — I10 Essential (primary) hypertension: Secondary | ICD-10-CM

## 2020-04-25 DIAGNOSIS — R062 Wheezing: Secondary | ICD-10-CM | POA: Diagnosis present

## 2020-04-25 LAB — COMPREHENSIVE METABOLIC PANEL
ALT: 18 U/L (ref 0–35)
AST: 26 U/L (ref 0–37)
Albumin: 3.7 g/dL (ref 3.5–5.2)
Alkaline Phosphatase: 83 U/L (ref 39–117)
BUN: 16 mg/dL (ref 6–23)
CO2: 32 mEq/L (ref 19–32)
Calcium: 8.8 mg/dL (ref 8.4–10.5)
Chloride: 95 mEq/L — ABNORMAL LOW (ref 96–112)
Creatinine, Ser: 0.96 mg/dL (ref 0.40–1.20)
GFR: 53.36 mL/min — ABNORMAL LOW (ref >60.00)
Glucose, Bld: 81 mg/dL (ref 70–99)
Potassium: 3.7 mEq/L (ref 3.5–5.1)
Sodium: 136 mEq/L (ref 135–145)
Total Bilirubin: 0.8 mg/dL (ref 0.2–1.2)
Total Protein: 6.6 g/dL (ref 6.0–8.3)

## 2020-04-25 LAB — BRAIN NATRIURETIC PEPTIDE: Pro B Natriuretic peptide (BNP): 265 pg/mL — ABNORMAL HIGH (ref 0.0–100.0)

## 2020-04-25 MED ORDER — LEVOCETIRIZINE DIHYDROCHLORIDE 5 MG PO TABS: 5.0000 mg | ORAL_TABLET | Freq: Every evening | ORAL | 3 refills | Status: DC

## 2020-04-25 MED ORDER — METHYLPREDNISOLONE ACETATE 40 MG/ML IJ SUSP
40.0000 mg | Freq: Once | INTRAMUSCULAR | Status: AC
Start: 2020-04-25 — End: 2020-04-25
  Administered 2020-04-25: 40 mg via INTRAMUSCULAR

## 2020-04-25 MED ORDER — BUDESONIDE-FORMOTEROL FUMARATE 160-4.5 MCG/ACT IN AERO
2.0000 | INHALATION_SPRAY | Freq: Two times a day (BID) | RESPIRATORY_TRACT | 3 refills | Status: DC
Start: 2020-04-25 — End: 2021-03-25

## 2020-04-25 NOTE — Patient Instructions (Addendum)
Wheezing recently over the past approximate 10 days with some nasal congestion.  Work-up in the emergency department was negative for Covid, pneumonia and no CH flare though BNP was a little bit elevated.  Considering probable reactive airway/asthma.  We will see how she responds to treatment and consider referring to pulmonologist for PFTs.   Oxygen saturation 96% today.  Though she does have obvious wheezing present during the interview across the room.  We will give Depo-Medrol 40 mg IM injection today.  Note wanted to give patient albuterol treatment but due to regulations and lingering Covid we could not give neb treatment today.  Patient is not diabetic.    I did prescribe Symbicort inhaler.  Hopefully respond well to this.  We will follow chest x-ray, BNP and CMP.  If no indicators of developing CHF then will consider 6-day taper dose of prednisone.  Benefit and risk discussed regarding prednisone use.  Follow-up in 7 days or as needed.

## 2020-04-25 NOTE — Progress Notes (Signed)
Subjective:    Patient ID: Alyssa Williamson, female    DOB: 1931/08/09, 87 y.o.   MRN: [id number]  HPI  Pt in states she has had some nasal congestion for about 10 days.  She has some hoarse voice.  Pt notes about one year ago she had illness that she thought was covid. She wheezed at that time.  Monday pt went to the ED.     hpi from ED.   "The history is provided by the patient.  Shortness of Breath Severity:  Mild Onset quality:  Gradual Timing:  Intermittent Progression:  Waxing and waning Chronicity:  Recurrent Context: URI   Relieved by:  Nothing Worsened by:  Nothing Associated symptoms: cough and wheezing   Associated symptoms: no abdominal pain, no chest pain, no ear pain, no fever, no rash, no sore throat and no vomiting   Risk factors: no hx of PE/DVT "  ROS in ED cough, sob, wheezing and leg swelling.  cmp done bnp 248.5.  Cxr  FINDINGS: Cardiac shadow is mildly enlarged but accentuated by the frontal technique. The lungs are well aerated bilaterally. No focal infiltrate or sizable effusion is seen. No acute bony abnormality is noted.  IMPRESSION: No acute abnormality noted.   Pt states when she uses albuterol she does get some brief relief. She state no hx of smoking or any second hand smoke.  Pt had negative at home covid test and negative pcr in ED.   No pneumonia or chf flare on cxr. But bnp elevated.  Review of Systems  Constitutional: Negative for chills, fatigue and fever.  Respiratory: Negative for cough, chest tightness, shortness of breath and wheezing.   Cardiovascular: Negative for chest pain and palpitations.  Gastrointestinal: Negative for abdominal pain, blood in stool, constipation and diarrhea.  Musculoskeletal: Negative for back pain, joint swelling and neck stiffness.  Skin: Negative for rash.  Neurological: Negative for dizziness, seizures, weakness and light-headedness.  Hematological: Negative for adenopathy. Does not  bruise/bleed easily.  Psychiatric/Behavioral: Negative for behavioral problems, confusion and suicidal ideas. The patient is not nervous/anxious.    Past Medical History:  Diagnosis Date  . Allergic rhinitis   . Anemia   . Anxiety   . Arthritis    bilateral knee  . Atrial fibrillation (HCC) 12/21/2016  . Cervical cancer screening 10/30/2013   Menarche at 15 Regular and moderate flow No history of abnormal pap in past G4P3, s/p 3 svd and 1 MC No history of abnormal MGM Noconcerns today No gyn surgeries  . Constipation 11/18/2015  . Depression with anxiety   . Diverticulitis   . Heart murmur   . Hyperlipidemia   . Hypertension   . Hypothyroid   . Left knee DJD   . Medicare annual wellness visit, subsequent 10/30/2013  . PONV (postoperative nausea and vomiting)   . Preventative health care 05/20/2016     Social History   Socioeconomic History  . Marital status: Widowed    Spouse name: Not on file  . Number of children: 3  . Years of education: Not on file  . Highest education level: Not on file  Occupational History  . Not on file  Tobacco Use  . Smoking status: Never Smoker  . Smokeless tobacco: Never Used  Vaping Use  . Vaping Use: Never used  Substance and Sexual Activity  . Alcohol use: No  . Drug use: No  . Sexual activity: Not on file  Other Topics Concern  . Not on  file  Social History Narrative  . Not on file   Social Determinants of Health   Financial Resource Strain: Not on file  Food Insecurity: Not on file  Transportation Needs: Not on file  Physical Activity: Not on file  Stress: Not on file  Social Connections: Not on file  Intimate Partner Violence: Not on file    Past Surgical History:  Procedure Laterality Date  . ABDOMINAL HYSTERECTOMY    . bladder tack  04/2010   Dr Marciano Sequin  . GANGLION CYST EXCISION    . JOINT REPLACEMENT Right   . REPLACEMENT TOTAL KNEE  10/2010   right knee-Murphy   . TONSILLECTOMY    . TOTAL KNEE ARTHROPLASTY  Left 07/26/2012   Procedure: TOTAL KNEE ARTHROPLASTY;  Surgeon: Loreta Ave, MD;  Location: [facility] OR;  Service: Orthopedics;  Laterality: Left;    Family History  Problem Relation Age of Onset  . Alcohol abuse Father   . Breast cancer Sister   . Cancer Sister        bladder  . Glaucoma Sister   . Arthritis Daughter   . Diabetes Daughter        type 2  . Cancer Daughter        brain cancer, diagnosed 29 years.agp  . Mental illness Daughter        s/p craniotomies, gamma knife for tumors. numerous shunts.  . Heart failure Mother   . Cancer Brother        glioblastoma   . Glaucoma Brother   . Diverticulosis Sister   . Cancer Sister        liver  . Cancer Brother        melanoma, mets to lung and brain and intestine  . Alcohol abuse Brother   . Cancer Brother        liver  . Glaucoma Maternal Grandfather   . Cancer Sister        breast  . Colon cancer Neg Hx   . Hypertension Neg Hx     Allergies  Allergen Reactions  . Cefdinir Dermatitis  . Codeine Nausea And Vomiting    Current Outpatient Medications on File Prior to Visit  Medication Sig Dispense Refill  . albuterol (PROVENTIL HFA) 108 (90 Base) MCG/ACT inhaler INHALE 2 PUFFS INTO THE LUNGS EVERY 6 HOURS AS NEEDED FOR WHEEZING OR SHORTNESS OF BREATH 18 g 2  . ALPRAZolam (XANAX) 0.25 MG tablet Take 1 tablet (0.25 mg total) by mouth daily as needed for anxiety. 30 tablet 1  . apixaban (ELIQUIS) 5 MG TABS tablet Take 1 tablet (5 mg total) by mouth 2 (two) times daily. 180 tablet 1  . atorvastatin (LIPITOR) 40 MG tablet Take 1 tablet (40 mg total) by mouth daily. 90 tablet 1  . citalopram (CELEXA) 20 MG tablet Take 1 tablet (20 mg total) by mouth daily. 90 tablet 1  . Cranberry 500 MG CAPS Take 500 mg by mouth daily.    . furosemide (LASIX) 20 MG tablet Take 1 tablet (20 mg total) by mouth daily. 90 tablet 1  . levothyroxine (EUTHYROX) 75 MCG tablet Take 1 tablet (75 mcg total) by mouth daily before breakfast. 90 tablet 1   . loratadine (CLARITIN) 10 MG tablet Take 10 mg by mouth daily.    . Multiple Vitamins-Minerals (CENTRUM SILVER PO) Take 1 tablet by mouth daily.    . verapamil (CALAN-SR) 240 MG CR tablet Take 1 tablet (240 mg total) by mouth daily.  90 tablet 1   No current facility-administered medications on file prior to visit.    BP (!) 161/52   Pulse 72   Temp 97.7 F (36.5 C) (Oral)   Resp (!) 22   Ht 5\' 2"  (1.575 m)   Wt 188 lb 12.8 oz (85.6 kg)   SpO2 96%   BMI 34.53 kg/m       Objective:   Physical Exam  General- No acute distress. Pleasant patient. Neck- Full range of motion, no jvd. Lungs-scattered wheezing throughout.  But even and unlabored. Heart- regular rate and rhythm. Neurologic- CNII- XII grossly intact.  HEENT-no sinus pressure.  Lower extremity-bilateral calves symmetric.  No pedal edema.  Negative Homans' sign.       Assessment & Plan:  Wheezing recently over the past approximate 10 days with some nasal congestion.  Work-up in the emergency department was negative for Covid, pneumonia and no CH flare though BNP was a little bit elevated.   Oxygen saturation 96% today.  Though she does have obvious wheezing present during the interview across the room.  We will give Depo-Medrol 40 mg IM injection today.  Note wanted to give patient albuterol treatment but due to regulations and lingering Covid we could not give neb treatment today.  Patient is not diabetic.    I did prescribe Symbicort inhaler.  Hopefully respond well to this.  We will follow chest x-ray, BNP and CMP.  If no indicators of developing CHF then will consider 6-day taper dose of prednisone.  Benefit and risk discussed regarding prednisone use.  Follow-up in 7 days or as needed.

## 2020-04-25 NOTE — Addendum Note (Signed)
Addended by: Gwenevere Abbot on: 04/25/2020 11:23 AM   Modules accepted: Orders

## 2020-04-27 ENCOUNTER — Telehealth: Payer: Self-pay | Admitting: Medical

## 2020-04-27 MED ORDER — PREDNISONE 10 MG (21) PO TBPK
ORAL_TABLET | ORAL | 0 refills | Status: DC
Start: 2020-04-27 — End: 2020-06-26

## 2020-04-27 NOTE — Telephone Encounter (Signed)
Rx taper prednisone sent to pt pharmacy. 

## 2020-05-04 ENCOUNTER — Other Ambulatory Visit: Payer: Self-pay | Admitting: Family Medicine

## 2020-05-04 DIAGNOSIS — I10 Essential (primary) hypertension: Secondary | ICD-10-CM

## 2020-05-05 NOTE — Telephone Encounter (Signed)
Requesting: alprazolam Contract: 12/21/16 UDS: 12/21/16 Last Visit: 04/25/20 Next Visit: 06/26/20 Last Refill: 03/03/20  Please Advise

## 2020-05-24 ENCOUNTER — Other Ambulatory Visit: Payer: Self-pay | Admitting: Cardiology

## 2020-06-05 ENCOUNTER — Other Ambulatory Visit: Payer: Self-pay | Admitting: Family Medicine

## 2020-06-05 ENCOUNTER — Telehealth: Payer: Self-pay | Admitting: Family Medicine

## 2020-06-05 DIAGNOSIS — I10 Essential (primary) hypertension: Secondary | ICD-10-CM

## 2020-06-05 MED ORDER — ALPRAZOLAM 0.25 MG PO TABS: 0.2500 mg | ORAL_TABLET | Freq: Every day | ORAL | 1 refills | Status: DC | PRN

## 2020-06-05 NOTE — Telephone Encounter (Signed)
refilled 

## 2020-06-05 NOTE — Telephone Encounter (Signed)
Requesting:xanax 0.25 mg tablet Contract:12/21/16 UDS:12/21/16 Last Visit:04/25/20 Next Visit:06/26/20 Last Refill: 05/05/20  Please Advise

## 2020-06-05 NOTE — Telephone Encounter (Signed)
Medication: ALPRAZolam (XANAX) 0.25 MG tablet   Has the patient contacted their pharmacy? No. (If no, request that the patient contact the pharmacy for the refill.) (If yes, when and what did the pharmacy advise?)  Preferred Pharmacy (with phone number or street name):   [facility] [address] [place], [place] - [address]  [address], [place] [address]  Phone:  [phone] Fax:  [phone]  Agent: Please be advised that RX refills may take up to 3 business days. We ask that you follow-up with your pharmacy.

## 2020-06-09 ENCOUNTER — Encounter (HOSPITAL_BASED_OUTPATIENT_CLINIC_OR_DEPARTMENT_OTHER): Payer: Self-pay | Admitting: *Deleted

## 2020-06-09 ENCOUNTER — Emergency Department (HOSPITAL_BASED_OUTPATIENT_CLINIC_OR_DEPARTMENT_OTHER)
Admission: EM | Admit: 2020-06-09 | Discharge: 2020-06-09 | Disposition: A | Discharge status: Home / Self Care | Payer: Medicare Other | Attending: Emergency Medicine | Admitting: Emergency Medicine

## 2020-06-09 ENCOUNTER — Other Ambulatory Visit (HOSPITAL_BASED_OUTPATIENT_CLINIC_OR_DEPARTMENT_OTHER): Payer: Self-pay

## 2020-06-09 ENCOUNTER — Other Ambulatory Visit: Payer: Self-pay

## 2020-06-09 DIAGNOSIS — I10 Essential (primary) hypertension: Secondary | ICD-10-CM | POA: Diagnosis not present

## 2020-06-09 DIAGNOSIS — N3090 Cystitis, unspecified without hematuria: Secondary | ICD-10-CM | POA: Diagnosis not present

## 2020-06-09 DIAGNOSIS — Z7901 Long term (current) use of anticoagulants: Secondary | ICD-10-CM | POA: Diagnosis not present

## 2020-06-09 DIAGNOSIS — Z96653 Presence of artificial knee joint, bilateral: Secondary | ICD-10-CM | POA: Insufficient documentation

## 2020-06-09 DIAGNOSIS — Z79899 Other long term (current) drug therapy: Secondary | ICD-10-CM | POA: Insufficient documentation

## 2020-06-09 DIAGNOSIS — E039 Hypothyroidism, unspecified: Secondary | ICD-10-CM | POA: Diagnosis not present

## 2020-06-09 DIAGNOSIS — I4891 Unspecified atrial fibrillation: Secondary | ICD-10-CM | POA: Insufficient documentation

## 2020-06-09 DIAGNOSIS — N939 Abnormal uterine and vaginal bleeding, unspecified: Secondary | ICD-10-CM | POA: Diagnosis present

## 2020-06-09 LAB — CBC
HCT: 40.7 % (ref 36.0–46.0)
Hemoglobin: 13.5 g/dL (ref 12.0–15.0)
MCH: 32.3 pg (ref 26.0–34.0)
MCHC: 33.2 g/dL (ref 30.0–36.0)
MCV: 97.4 fL (ref 80.0–100.0)
Platelets: 193 10*3/uL (ref 150–400)
RBC: 4.18 MIL/uL (ref 3.87–5.11)
RDW: 13.2 % (ref 11.5–15.5)
WBC: 7.8 10*3/uL (ref 4.0–10.5)
nRBC: 0 % (ref 0.0–0.2)

## 2020-06-09 LAB — WET PREP, GENITAL
Clue Cells Wet Prep HPF POC: NONE SEEN
Sperm: NONE SEEN
Trich, Wet Prep: NONE SEEN
Yeast Wet Prep HPF POC: NONE SEEN

## 2020-06-09 LAB — URINALYSIS, ROUTINE W REFLEX MICROSCOPIC
Bilirubin Urine: NEGATIVE
Glucose, UA: NEGATIVE mg/dL
Ketones, ur: NEGATIVE mg/dL
Nitrite: NEGATIVE
Protein, ur: NEGATIVE mg/dL
Specific Gravity, Urine: <1.005 — ABNORMAL LOW (ref 1.005–1.030)
pH: 7 (ref 5.0–8.0)

## 2020-06-09 LAB — URINALYSIS, MICROSCOPIC (REFLEX)

## 2020-06-09 MED ORDER — SULFAMETHOXAZOLE-TRIMETHOPRIM 800-160 MG PO TABS
1.0000 | ORAL_TABLET | Freq: Two times a day (BID) | ORAL | 0 refills | Status: AC
  Filled 2020-06-09: qty 6, 3d supply, fill #0

## 2020-06-09 NOTE — ED Triage Notes (Addendum)
States after urination she wiped and had bright red blood on the tissue. She feels she has vaginal bleeding as a result of a scratch on her vaginal wall with the rough tissue. She takes blood thinners.

## 2020-06-09 NOTE — Telephone Encounter (Signed)
Patient notified and has picked up rx.

## 2020-06-09 NOTE — ED Provider Notes (Signed)
[facility] EMERGENCY DEPARTMENT Provider Note   CSN: [id number] Arrival date & time: 06/09/20  1257     History Chief Complaint  Patient presents with  . Vaginal Bleeding    Alyssa Williamson is a 87 y.o. female.  The history is provided by the patient.  Vaginal Bleeding Quality:  Bright red Severity:  Moderate Onset quality:  Sudden Timing:  Intermittent Progression:  Unchanged Chronicity:  New Context: spontaneously   Context comment:  Does take eliquis Relieved by:  Nothing Worsened by:  Nothing Ineffective treatments:  None tried Associated symptoms: no abdominal pain, no back pain, no dysuria and no fever   Risk factors comment:  History of hysterectomy      Past Medical History:  Diagnosis Date  . Allergic rhinitis   . Anemia   . Anxiety   . Arthritis    bilateral knee  . Atrial fibrillation (HCC) 12/21/2016  . Cervical cancer screening 10/30/2013   Menarche at 15 Regular and moderate flow No history of abnormal pap in past G4P3, s/p 3 svd and 1 MC No history of abnormal MGM Noconcerns today No gyn surgeries  . Constipation 11/18/2015  . Depression with anxiety   . Diverticulitis   . Heart murmur   . Hyperlipidemia   . Hypertension   . Hypothyroid   . Left knee DJD   . Medicare annual wellness visit, subsequent 10/30/2013  . PONV (postoperative nausea and vomiting)   . Preventative health care 05/20/2016    Patient Active Problem List   Diagnosis Date Noted  . Fall 07/10/2017  . PND (post-nasal drip) 04/04/2017  . Hypocalcemia 12/26/2016  . Atrial fibrillation (HCC) 12/21/2016  . Preventative health care 05/20/2016  . Constipation 11/18/2015  . History of thyroid nodule 07/24/2014  . Allergic rhinitis 07/05/2014  . Atypical chest pain 11/04/2013  . Other malaise and fatigue 10/30/2013  . Medicare annual wellness visit, subsequent 10/30/2013  . Cervical cancer screening 10/30/2013  . Hyperglycemia 05/23/2013  . Hyponatremia 07/28/2012  .  Diverticulitis   . Arthritis   . Heart murmur   . Anxiety   . Anemia   . Intertrigo 06/07/2012  . Arthritis of knee 05/24/2012  . Hypothyroidism 06/27/2011  . HTN (hypertension) 06/27/2011  . Hyperlipidemia 06/27/2011  . Recurrent UTI 06/27/2011  . Murmur 06/27/2011    Past Surgical History:  Procedure Laterality Date  . ABDOMINAL HYSTERECTOMY    . bladder tack  04/2010   Dr Marciano Sequin  . GANGLION CYST EXCISION    . JOINT REPLACEMENT Right   . REPLACEMENT TOTAL KNEE  10/2010   right knee-Murphy   . TONSILLECTOMY    . TOTAL KNEE ARTHROPLASTY Left 07/26/2012   Procedure: TOTAL KNEE ARTHROPLASTY;  Surgeon: Loreta Ave, MD;  Location: [facility] OR;  Service: Orthopedics;  Laterality: Left;     OB History   No obstetric history on file.     Family History  Problem Relation Age of Onset  . Alcohol abuse Father   . Breast cancer Sister   . Cancer Sister        bladder  . Glaucoma Sister   . Arthritis Daughter   . Diabetes Daughter        type 2  . Cancer Daughter        brain cancer, diagnosed 29 years.agp  . Mental illness Daughter        s/p craniotomies, gamma knife for tumors. numerous shunts.  . Heart failure Mother   .  Cancer Brother        glioblastoma   . Glaucoma Brother   . Diverticulosis Sister   . Cancer Sister        liver  . Cancer Brother        melanoma, mets to lung and brain and intestine  . Alcohol abuse Brother   . Cancer Brother        liver  . Glaucoma Maternal Grandfather   . Cancer Sister        breast  . Colon cancer Neg Hx   . Hypertension Neg Hx     Social History   Tobacco Use  . Smoking status: Never Smoker  . Smokeless tobacco: Never Used  Vaping Use  . Vaping Use: Never used  Substance Use Topics  . Alcohol use: No  . Drug use: No    Home Medications Prior to Admission medications   Medication Sig Start Date End Date Taking? Authorizing Provider  albuterol (PROVENTIL HFA) 108 (90 Base) MCG/ACT inhaler INHALE 2  PUFFS INTO THE LUNGS EVERY 6 HOURS AS NEEDED FOR WHEEZING OR SHORTNESS OF BREATH 03/03/20   Bradd Canary, MD  ALPRAZolam [name] 0.25 MG tablet Take 1 tablet (0.25 mg total) by mouth daily as needed for anxiety. 06/05/20   Bradd Canary, MD  atorvastatin (LIPITOR) 40 MG tablet Take 1 tablet (40 mg total) by mouth daily. 02/25/20   Bradd Canary, MD  budesonide-formoterol [facility]) 160-4.5 MCG/ACT inhaler Inhale 2 puffs into the lungs 2 (two) times daily. 04/25/20   Saguier, Ramon Dredge, PA-C  citalopram (CELEXA) 20 MG tablet Take 1 tablet (20 mg total) by mouth daily. 03/25/20   Bradd Canary, MD  Cranberry 500 MG CAPS Take 500 mg by mouth daily.    [provider]  ELIQUIS 5 MG TABS tablet Take 1 tablet by mouth twice daily 05/26/20   Lewayne Bunting, MD  EUTHYROX 75 MCG tablet TAKE 1 TABLET BY MOUTH ONCE DAILY BEFORE BREAKFAST 05/05/20   Bradd Canary, MD  furosemide (LASIX) 20 MG tablet Take 1 tablet (20 mg total) by mouth daily. 01/29/20   Bradd Canary, MD  levocetirizine (XYZAL) 5 MG tablet Take 1 tablet (5 mg total) by mouth every evening. 04/25/20   Saguier, Ramon Dredge, PA-C  loratadine (CLARITIN) 10 MG tablet Take 10 mg by mouth daily.    [provider]  Multiple Vitamins-Minerals (CENTRUM SILVER PO) Take 1 tablet by mouth daily.    [provider]  predniSONE (STERAPRED UNI-PAK 21 TAB) 10 MG (21) TBPK tablet Standard taper over 6 days. 04/27/20   Saguier, Ramon Dredge, PA-C  verapamil (CALAN-SR) 240 MG CR tablet Take 1 tablet (240 mg total) by mouth daily. 03/25/20   Bradd Canary, MD    Allergies    Cefdinir and Codeine  Review of Systems   Review of Systems  Constitutional: Negative for chills and fever.  HENT: Negative for ear pain and sore throat.   Eyes: Negative for pain and visual disturbance.  Respiratory: Negative for cough and shortness of breath.   Cardiovascular: Negative for chest pain and palpitations.  Gastrointestinal: Negative for abdominal pain and  vomiting.  Genitourinary: Positive for vaginal bleeding. Negative for dysuria and hematuria.  Musculoskeletal: Negative for arthralgias and back pain.  Skin: Negative for color change and rash.  Neurological: Negative for seizures and syncope.  All other systems reviewed and are negative.   Physical Exam Updated Vital Signs BP (!) 121/52 (BP Location: Left  Arm)   Pulse 64   Temp 98 F (36.7 C) (Oral)   Resp 20   Ht 5\' 2"  (1.575 m)   Wt 85 kg   SpO2 97%   BMI 34.26 kg/m   Physical Exam Vitals and nursing note reviewed. Exam conducted with a chaperone present.  HENT:     Head: Normocephalic and atraumatic.  Eyes:     General: No scleral icterus. Pulmonary:     Effort: Pulmonary effort is normal. No respiratory distress.  Abdominal:     General: There is no distension.     Tenderness: There is no abdominal tenderness.  Genitourinary:    General: Normal vulva.     Vagina: No signs of injury and foreign body. No bleeding.     Comments: Scant blood around the urethral meatus Musculoskeletal:     Cervical back: Normal range of motion.  Skin:    General: Skin is warm and dry.  Neurological:     Mental Status: She is alert.  Psychiatric:        Mood and Affect: Mood normal.     ED Results / Procedures / Treatments   Labs (all labs ordered are listed, but only abnormal results are displayed) Labs Reviewed  WET PREP, GENITAL - Abnormal; Notable for the following components:      Result Value   WBC, Wet Prep HPF POC MODERATE (*)    All other components within normal limits  URINALYSIS, ROUTINE W REFLEX MICROSCOPIC - Abnormal; Notable for the following components:   Specific Gravity, Urine <1.005 (*)    Hgb urine dipstick LARGE (*)    Leukocytes,Ua MODERATE (*)    All other components within normal limits  URINALYSIS, MICROSCOPIC (REFLEX) - Abnormal; Notable for the following components:   Bacteria, UA MANY (*)    All other components within normal limits  URINE  CULTURE  CBC    EKG None  Radiology No results found.  Procedures Procedures   Medications Ordered in ED Medications - No data to display  ED Course  I have reviewed the triage vital signs and the nursing notes.  Pertinent labs & imaging results that were available during my care of the patient were reviewed by me and considered in my medical decision making (see chart for details).    MDM Rules/Calculators/A&P                          Alyssa Williamson presents with what she thinks is vaginal bleeding.  It appears that she is likely having hemorrhagic cystitis.  She will be prescribed Bactrim based on her age and allergy profile.  This is not the ideal medication, but a culture is pending.  I did think it was safe for her to use this medication as opposed to ciprofloxacin.  She does not have a uterus, and uterine cancer is not in the differential.  No signs of massive hemorrhage.  She can continue to take her anticoagulation. Final Clinical Impression(s) / ED Diagnoses Final diagnoses:  Cystitis    Rx / DC Orders ED Discharge Orders         Ordered    sulfamethoxazole-trimethoprim (BACTRIM DS) 800-160 MG tablet  2 times daily        06/09/20 1519           06/11/20, MD 06/09/20 [phone]

## 2020-06-12 LAB — URINE CULTURE: Culture: >=100000 — AB

## 2020-06-13 ENCOUNTER — Telehealth: Payer: Self-pay | Admitting: Emergency Medicine

## 2020-06-13 NOTE — Telephone Encounter (Signed)
Post ED Visit - Positive Culture Follow-up: Successful Patient Follow-Up  Culture assessed and recommendations reviewed by:  []  , Pharm.D. []  Enzo Bi, Pharm.D., BCPS AQ-ID []  , Pharm.D., BCPS []  Celedonio Miyamoto, Pharm.D., BCPS []  [place], Garvin Fila.D., BCPS, AAHIVP []  , Pharm.D., BCPS, AAHIVP []  Georgina Pillion, PharmD, BCPS []  , PharmD, BCPS []  [place], PharmD, BCPS []  [place], PharmD  Positive urine culture  []  Patient discharged without antimicrobial prescription and treatment is now indicated [x]  Organism is resistant to prescribed ED discharge antimicrobial []  Patient with positive blood cultures  Changes discussed with ED provider: Dr New antibiotic prescription symptom check, if hematuria persists, then prescribe cephalexin 500mg  po q 12hours x days and stop sulfamethoxole-trimethoprim. If asymptomatic, may d/c antibiotics  Attempting to contact patient   Alyssa Williamson 06/13/2020, 11:47 AM

## 2020-06-26 ENCOUNTER — Ambulatory Visit (INDEPENDENT_AMBULATORY_CARE_PROVIDER_SITE_OTHER): Payer: Medicare Other | Admitting: Family Medicine

## 2020-06-26 ENCOUNTER — Other Ambulatory Visit: Payer: Self-pay

## 2020-06-26 ENCOUNTER — Ambulatory Visit: Payer: Medicare Other | Attending: Internal Medicine

## 2020-06-26 DIAGNOSIS — E039 Hypothyroidism, unspecified: Secondary | ICD-10-CM | POA: Diagnosis not present

## 2020-06-26 DIAGNOSIS — Z79899 Other long term (current) drug therapy: Secondary | ICD-10-CM

## 2020-06-26 DIAGNOSIS — Z1231 Encounter for screening mammogram for malignant neoplasm of breast: Secondary | ICD-10-CM

## 2020-06-26 DIAGNOSIS — M62838 Other muscle spasm: Secondary | ICD-10-CM

## 2020-06-26 DIAGNOSIS — M25511 Pain in right shoulder: Secondary | ICD-10-CM

## 2020-06-26 DIAGNOSIS — W19XXXD Unspecified fall, subsequent encounter: Secondary | ICD-10-CM

## 2020-06-26 DIAGNOSIS — I10 Essential (primary) hypertension: Secondary | ICD-10-CM | POA: Diagnosis not present

## 2020-06-26 DIAGNOSIS — R739 Hyperglycemia, unspecified: Secondary | ICD-10-CM

## 2020-06-26 DIAGNOSIS — E7800 Pure hypercholesterolemia, unspecified: Secondary | ICD-10-CM | POA: Diagnosis not present

## 2020-06-26 DIAGNOSIS — I4891 Unspecified atrial fibrillation: Secondary | ICD-10-CM

## 2020-06-26 DIAGNOSIS — Z23 Encounter for immunization: Secondary | ICD-10-CM

## 2020-06-26 LAB — COMPREHENSIVE METABOLIC PANEL
ALT: 20 U/L (ref 0–35)
AST: 23 U/L (ref 0–37)
Albumin: 3.8 g/dL (ref 3.5–5.2)
Alkaline Phosphatase: 86 U/L (ref 39–117)
BUN: 20 mg/dL (ref 6–23)
CO2: 33 mEq/L — ABNORMAL HIGH (ref 19–32)
Calcium: 9.3 mg/dL (ref 8.4–10.5)
Chloride: 99 mEq/L (ref 96–112)
Creatinine, Ser: 1.09 mg/dL (ref 0.40–1.20)
GFR: 45.76 mL/min — ABNORMAL LOW (ref >60.00)
Glucose, Bld: 93 mg/dL (ref 70–99)
Potassium: 4.4 mEq/L (ref 3.5–5.1)
Sodium: 138 mEq/L (ref 135–145)
Total Bilirubin: 1.1 mg/dL (ref 0.2–1.2)
Total Protein: 6.8 g/dL (ref 6.0–8.3)

## 2020-06-26 LAB — HEMOGLOBIN A1C: Hgb A1c MFr Bld: 5.9 % (ref 4.6–6.5)

## 2020-06-26 LAB — CBC
HCT: 41.8 % (ref 36.0–46.0)
Hemoglobin: 14.1 g/dL (ref 12.0–15.0)
MCHC: 33.8 g/dL (ref 30.0–36.0)
MCV: 97.2 fl (ref 78.0–100.0)
Platelets: 212 10*3/uL (ref 150.0–400.0)
RBC: 4.3 Mil/uL (ref 3.87–5.11)
RDW: 14.1 % (ref 11.5–15.5)
WBC: 7.5 10*3/uL (ref 4.0–10.5)

## 2020-06-26 LAB — LIPID PANEL
Cholesterol: 154 mg/dL (ref 0–200)
HDL: 73.9 mg/dL (ref >39.00)
LDL Cholesterol: 67 mg/dL (ref 0–99)
NonHDL: 79.86
Total CHOL/HDL Ratio: 2
Triglycerides: 65 mg/dL (ref 0.0–149.0)
VLDL: 13 mg/dL (ref 0.0–40.0)

## 2020-06-26 LAB — MAGNESIUM: Magnesium: 2.1 mg/dL (ref 1.5–2.5)

## 2020-06-26 LAB — TSH: TSH: 0.37 u[IU]/mL (ref 0.35–4.50)

## 2020-06-26 NOTE — Progress Notes (Signed)
   Covid-19 Vaccination Clinic  Name:  Alyssa Williamson    MRN: [id number] DOB: August 25, 1931  06/26/2020  [name] was observed post Covid-19 immunization for 15 minutes without incident. She was provided with Vaccine Information Sheet and instruction to access the V-Safe system.   Ms. Salmon was instructed to call 911 with any severe reactions post vaccine: Marland Kitchen Difficulty breathing  . Swelling of face and throat  . A fast heartbeat  . A bad rash all over body  . Dizziness and weakness   Immunizations Administered    Name Date Dose VIS Date Route   PFIZER Comrnaty(Gray TOP) Covid-19 Vaccine 06/26/2020 12:10 PM 0.3 mL 01/31/2020 Intramuscular   Manufacturer: [employer], [employer]   Lot: [id number]   NDC: [phone]

## 2020-06-26 NOTE — Patient Instructions (Addendum)
[facility] pharmacy [phone] for shots  Hypertension, Adult High blood pressure (hypertension) is when the force of blood pumping through the arteries is too strong. The arteries are the blood vessels that carry blood from the heart throughout the body. Hypertension forces the heart to work harder to pump blood and may cause arteries to become narrow or stiff. Untreated or uncontrolled hypertension can cause a heart attack, heart failure, a stroke, kidney disease, and other problems. A blood pressure reading consists of a higher number over a lower number. Ideally, your blood pressure should be below 120/80. The first ("top") number is called the systolic pressure. It is a measure of the pressure in your arteries as your heart beats. The second ("bottom") number is called the diastolic pressure. It is a measure of the pressure in your arteries as the heart relaxes. What are the causes? The exact cause of this condition is not known. There are some conditions that result in or are related to high blood pressure. What increases the risk? Some risk factors for high blood pressure are under your control. The following factors may make you more likely to develop this condition:  Smoking.  Having type 2 diabetes mellitus, high cholesterol, or both.  Not getting enough exercise or physical activity.  Being overweight.  Having too much fat, sugar, calories, or salt (sodium) in your diet.  Drinking too much alcohol. Some risk factors for high blood pressure may be difficult or impossible to change. Some of these factors include:  Having chronic kidney disease.  Having a family history of high blood pressure.  Age. Risk increases with age.  Race. You may be at higher risk if you are African American.  Gender. Men are at higher risk than women before age 63. After age 75, women are at higher risk than men.  Having obstructive sleep apnea.  Stress. What are the signs or symptoms? High blood  pressure may not cause symptoms. Very high blood pressure (hypertensive crisis) may cause:  Headache.  Anxiety.  Shortness of breath.  Nosebleed.  Nausea and vomiting.  Vision changes.  Severe chest pain.  Seizures. How is this diagnosed? This condition is diagnosed by measuring your blood pressure while you are seated, with your arm resting on a flat surface, your legs uncrossed, and your feet flat on the floor. The cuff of the blood pressure monitor will be placed directly against the skin of your upper arm at the level of your heart. It should be measured at least twice using the same arm. Certain conditions can cause a difference in blood pressure between your right and left arms. Certain factors can cause blood pressure readings to be lower or higher than normal for a short period of time:  When your blood pressure is higher when you are in a health care provider's office than when you are at home, this is called white coat hypertension. Most people with this condition do not need medicines.  When your blood pressure is higher at home than when you are in a health care provider's office, this is called masked hypertension. Most people with this condition may need medicines to control blood pressure. If you have a high blood pressure reading during one visit or you have normal blood pressure with other risk factors, you may be asked to:  Return on a different day to have your blood pressure checked again.  Monitor your blood pressure at home for 1 week or longer. If you are diagnosed with hypertension,  you may have other blood or imaging tests to help your health care provider understand your overall risk for other conditions. How is this treated? This condition is treated by making healthy lifestyle changes, such as eating healthy foods, exercising more, and reducing your alcohol intake. Your health care provider may prescribe medicine if lifestyle changes are not enough to get  your blood pressure under control, and if:  Your systolic blood pressure is above 130.  Your diastolic blood pressure is above 80. Your personal target blood pressure may vary depending on your medical conditions, your age, and other factors. Follow these instructions at home: Eating and drinking  Eat a diet that is high in fiber and potassium, and low in sodium, added sugar, and fat. An example eating plan is called the DASH (Dietary Approaches to Stop Hypertension) diet. To eat this way: ? Eat plenty of fresh fruits and vegetables. Try to fill one half of your plate at each meal with fruits and vegetables. ? Eat whole grains, such as whole-wheat pasta, brown rice, or whole-grain bread. Fill about one fourth of your plate with whole grains. ? Eat or drink low-fat dairy products, such as skim milk or low-fat yogurt. ? Avoid fatty cuts of meat, processed or cured meats, and poultry with skin. Fill about one fourth of your plate with lean proteins, such as fish, chicken without skin, beans, eggs, or tofu. ? Avoid pre-made and processed foods. These tend to be higher in sodium, added sugar, and fat.  Reduce your daily sodium intake. Most people with hypertension should eat less than 1,500 mg of sodium a day.  Do not drink alcohol if: ? Your health care provider tells you not to drink. ? You are pregnant, may be pregnant, or are planning to become pregnant.  If you drink alcohol: ? Limit how much you use to:  0-1 drink a day for women.  0-2 drinks a day for men. ? Be aware of how much alcohol is in your drink. In the [place] one drink equals one 12 oz bottle of beer (355 mL), one 5 oz glass of wine (148 mL), or one 1 oz glass of hard liquor (44 mL).   Lifestyle  Work with your health care provider to maintain a healthy body weight or to lose weight. Ask what an ideal weight is for you.  Get at least 30 minutes of exercise most days of the week. Activities may include walking, swimming, or  biking.  Include exercise to strengthen your muscles (resistance exercise), such as Pilates or lifting weights, as part of your weekly exercise routine. Try to do these types of exercises for 30 minutes at least 3 days a week.  Do not use any products that contain nicotine or tobacco, such as cigarettes, e-cigarettes, and chewing tobacco. If you need help quitting, ask your health care provider.  Monitor your blood pressure at home as told by your health care provider.  Keep all follow-up visits as told by your health care provider. This is important.   Medicines  Take over-the-counter and prescription medicines only as told by your health care provider. Follow directions carefully. Blood pressure medicines must be taken as prescribed.  Do not skip doses of blood pressure medicine. Doing this puts you at risk for problems and can make the medicine less effective.  Ask your health care provider about side effects or reactions to medicines that you should watch for. Contact a health care provider if you:  Think you  are having a reaction to a medicine you are taking.  Have headaches that keep coming back (recurring).  Feel dizzy.  Have swelling in your ankles.  Have trouble with your vision. Get help right away if you:  Develop a severe headache or confusion.  Have unusual weakness or numbness.  Feel faint.  Have severe pain in your chest or abdomen.  Vomit repeatedly.  Have trouble breathing. Summary  Hypertension is when the force of blood pumping through your arteries is too strong. If this condition is not controlled, it may put you at risk for serious complications.  Your personal target blood pressure may vary depending on your medical conditions, your age, and other factors. For most people, a normal blood pressure is less than 120/80.  Hypertension is treated with lifestyle changes, medicines, or a combination of both. Lifestyle changes include losing weight, eating  a healthy, low-sodium diet, exercising more, and limiting alcohol. This information is not intended to replace advice given to you by your health care provider. Make sure you discuss any questions you have with your health care provider. Document Revised: 10/19/2017 Document Reviewed: 10/19/2017 Elsevier Patient Education  2021 [employer].

## 2020-06-26 NOTE — Progress Notes (Signed)
Patient ID: Alyssa Williamson, female    DOB: [birth year]  Age: 87 y.o. MRN: [id number]    Subjective:  Subjective  HPI Alyssa Williamson presents for office visit today for follow up on a recent fall she experienced and atrial fibrillation. She reports that back in March she fell and injured her left hand, bruised her breasts, and had a skin laceration on her forehead. She states that her left hand is a lot better and she has regained full ROM. She denies any chest pain, SOB, fever, abdominal pain, cough, chills, sore throat, dysuria, urinary incontinence, back pain, HA, or N/VD.  She reports experiencing fasciculations behind her right shoulder and comes and goes. She states that she has been experiencing it for years, but recently it has become more frequent and worse when she takes hot showers. She denies experiencing tingling or weakness in that area.   Review of Systems  Constitutional: Negative for chills, fatigue and fever.  HENT: Negative for congestion, rhinorrhea, sinus pressure, sinus pain, sore throat and trouble swallowing.   Eyes: Negative for pain.  Respiratory: Negative for cough and shortness of breath.   Cardiovascular: Negative for chest pain, palpitations and leg swelling.  Gastrointestinal: Negative for abdominal pain, blood in stool, diarrhea, nausea and vomiting.  Genitourinary: Negative for decreased urine volume, flank pain, frequency, vaginal bleeding and vaginal discharge.  Musculoskeletal: Negative for back pain.  Neurological: Negative for headaches.    History Past Medical History:  Diagnosis Date  . Allergic rhinitis   . Anemia   . Anxiety   . Arthritis    bilateral knee  . Atrial fibrillation (HCC) 12/21/2016  . Cervical cancer screening 10/30/2013   Menarche at 15 Regular and moderate flow No history of abnormal pap in past G4P3, s/p 3 svd and 1 MC No history of abnormal MGM Noconcerns today No gyn surgeries  . Constipation 11/18/2015  . Depression with anxiety    . Diverticulitis   . Heart murmur   . Hyperlipidemia   . Hypertension   . Hypothyroid   . Left knee DJD   . Medicare annual wellness visit, subsequent 10/30/2013  . PONV (postoperative nausea and vomiting)   . Preventative health care 05/20/2016    She has a past surgical history that includes Replacement total knee (10/2010); bladder tack (04/2010); Abdominal hysterectomy; Tonsillectomy; Joint replacement (Right); Ganglion cyst excision; and Total knee arthroplasty (Left, 07/26/2012).   Her family history includes Alcohol abuse in her brother and father; Arthritis in her daughter; Breast cancer in her sister; Cancer in her brother, brother, brother, daughter, sister, sister, and sister; Diabetes in her daughter; Diverticulosis in her sister; Glaucoma in her brother, maternal grandfather, and sister; Heart failure in her mother; Mental illness in her daughter.She reports that she has never smoked. She has never used smokeless tobacco. She reports that she does not drink alcohol and does not use drugs.  Current Outpatient Medications on File Prior to Visit  Medication Sig Dispense Refill  . ALPRAZolam (XANAX) 0.25 MG tablet Take 1 tablet (0.25 mg total) by mouth daily as needed for anxiety. 30 tablet 1  . atorvastatin (LIPITOR) 40 MG tablet Take 1 tablet (40 mg total) by mouth daily. 90 tablet 1  . budesonide-formoterol (SYMBICORT) 160-4.5 MCG/ACT inhaler Inhale 2 puffs into the lungs 2 (two) times daily. 1 each 3  . citalopram (CELEXA) 20 MG tablet Take 1 tablet (20 mg total) by mouth daily. 90 tablet 1  . Cranberry 500 MG CAPS Take  500 mg by mouth daily.    Marland Kitchen ELIQUIS 5 MG TABS tablet Take 1 tablet by mouth twice daily 180 tablet 1  . EUTHYROX 75 MCG tablet TAKE 1 TABLET BY MOUTH ONCE DAILY BEFORE BREAKFAST 90 tablet 0  . furosemide (LASIX) 20 MG tablet Take 1 tablet (20 mg total) by mouth daily. 90 tablet 1  . levocetirizine (XYZAL) 5 MG tablet Take 1 tablet (5 mg total) by mouth every evening.  30 tablet 3  . loratadine (CLARITIN) 10 MG tablet Take 10 mg by mouth daily.    . Multiple Vitamins-Minerals (CENTRUM SILVER PO) Take 1 tablet by mouth daily.    . verapamil (CALAN-SR) 240 MG CR tablet Take 1 tablet (240 mg total) by mouth daily. 90 tablet 1   No current facility-administered medications on file prior to visit.     Objective:  Objective  Physical Exam Constitutional:      General: She is not in acute distress.    Appearance: Normal appearance. She is not ill-appearing or toxic-appearing.  HENT:     Head: Normocephalic and atraumatic.     Right Ear: Tympanic membrane, ear canal and external ear normal.     Left Ear: Tympanic membrane, ear canal and external ear normal.     Nose: No congestion or rhinorrhea.  Eyes:     Extraocular Movements: Extraocular movements intact.     Pupils: Pupils are equal, round, and reactive to light.  Cardiovascular:     Rate and Rhythm: Normal rate and regular rhythm.     Pulses: Normal pulses.     Heart sounds: Normal heart sounds. No murmur heard.   Pulmonary:     Effort: Pulmonary effort is normal. No respiratory distress.     Breath sounds: Normal breath sounds. No wheezing, rhonchi or rales.  Abdominal:     General: Bowel sounds are normal.     Palpations: Abdomen is soft. There is no mass.     Tenderness: There is no abdominal tenderness. There is no guarding.     Hernia: No hernia is present.  Musculoskeletal:        General: Normal range of motion.     Cervical back: Normal range of motion and neck supple.  Skin:    General: Skin is warm and dry.  Neurological:     Mental Status: She is alert and oriented to person, place, and time.  Psychiatric:        Behavior: Behavior normal.    There were no vitals taken for this visit. Wt Readings from Last 3 Encounters:  06/09/20 187 lb 4.8 oz (85 kg)  04/25/20 188 lb 12.8 oz (85.6 kg)  04/21/20 182 lb (82.6 kg)     Lab Results  Component Value Date   WBC 7.5  06/26/2020   HGB 14.1 06/26/2020   HCT 41.8 06/26/2020   PLT 212.0 06/26/2020   GLUCOSE 93 06/26/2020   CHOL 154 06/26/2020   TRIG 65.0 06/26/2020   HDL 73.90 06/26/2020   LDLCALC 67 06/26/2020   ALT 20 06/26/2020   AST 23 06/26/2020   NA 138 06/26/2020   K 4.4 06/26/2020   CL 99 06/26/2020   CREATININE 1.09 06/26/2020   BUN 20 06/26/2020   CO2 33 (H) 06/26/2020   TSH 0.37 06/26/2020   INR 0.99 07/24/2012   HGBA1C 5.9 06/26/2020    No results found.   Assessment & Plan:  Plan    No orders of the defined types were placed  in this encounter.   Problem List Items Addressed This Visit    Hypothyroidism    On Levothyroxine, continue to monitor      HTN (hypertension)    Well controlled, no changes to meds. Encouraged heart healthy diet such as the DASH diet and exercise as tolerated.       Relevant Orders   CBC (Completed)   Comprehensive metabolic panel (Completed)   TSH (Completed)   Hyperlipidemia    Encouraged heart healthy diet, increase exercise, avoid trans fats, consider a krill oil cap daily. Tolerating Atorastatin      Relevant Orders   Lipid panel (Completed)   Hyperglycemia    hgba1c acceptable, minimize simple carbs. Increase exercise as tolerated.       Relevant Orders   Hemoglobin A1c (Completed)   Atrial fibrillation (HCC)    Asymptomatic, tolerating Eliquis, rate controlled      Fall    Suffered left hand metacarpal fracture. Has followed with ortho Dr Mina Marble and she is now healed and did not require sugical correction      Right shoulder pain    Really describes it more like fasiculations behind right shoulder comes and goes. Has been happening off and on for about a year. It is mild and she will report if it worsens.        Other Visit Diagnoses    Muscle spasm    -  Primary   Relevant Orders   Magnesium (Completed)   High risk medication use       Relevant Orders   DRUG MONITORING, PANEL 8 WITH CONFIRMATION, URINE    Screening mammogram for breast cancer       Relevant Orders   MM Digital Screening      Follow-up: Return in about 6 months (around 12/27/2020) for annual exam.   I,David Hanna,acting as a scribe for Danise Edge, MD.,have documented all relevant documentation on the behalf of Danise Edge, MD,as directed by  Danise Edge, MD while in the presence of Danise Edge, MD.  I, Bradd Canary, MD personally performed the services described in this documentation. All medical record entries made by the scribe were at my direction and in my presence. I have reviewed the chart and agree that the record reflects my personal performance and is accurate and complete

## 2020-06-27 ENCOUNTER — Encounter: Payer: Self-pay | Admitting: *Deleted

## 2020-06-27 DIAGNOSIS — M25511 Pain in right shoulder: Secondary | ICD-10-CM | POA: Insufficient documentation

## 2020-06-27 LAB — DRUG MONITORING, PANEL 8 WITH CONFIRMATION, URINE
6 Acetylmorphine: NEGATIVE ng/mL (ref <10)
Alcohol Metabolites: NEGATIVE ng/mL
Amphetamines: NEGATIVE ng/mL (ref <500)
Benzodiazepines: NEGATIVE ng/mL (ref <100)
Buprenorphine, Urine: NEGATIVE ng/mL (ref <5)
Cocaine Metabolite: NEGATIVE ng/mL (ref <150)
Creatinine: 19.2 mg/dL
MDMA: NEGATIVE ng/mL (ref <500)
Marijuana Metabolite: NEGATIVE ng/mL (ref <20)
Opiates: NEGATIVE ng/mL (ref <100)
Oxidant: NEGATIVE ug/mL
Oxycodone: NEGATIVE ng/mL (ref <100)
Specific Gravity: 1.005 (ref >=1.003)
pH: 7.2 (ref 4.5–9.0)

## 2020-06-27 LAB — DM TEMPLATE

## 2020-06-27 NOTE — Assessment & Plan Note (Signed)
Suffered left hand metacarpal fracture. Has followed with ortho Dr Mina Marble and she is now healed and did not require sugical correction

## 2020-06-27 NOTE — Assessment & Plan Note (Signed)
Encouraged heart healthy diet, increase exercise, avoid trans fats, consider a krill oil cap daily. Tolerating Atorastatin

## 2020-06-27 NOTE — Assessment & Plan Note (Signed)
hgba1c acceptable, minimize simple carbs. Increase exercise as tolerated.  

## 2020-06-27 NOTE — Assessment & Plan Note (Signed)
Really describes it more like fasiculations behind right shoulder comes and goes. Has been happening off and on for about a year. It is mild and she will report if it worsens.

## 2020-06-27 NOTE — Assessment & Plan Note (Signed)
Asymptomatic, tolerating Eliquis, rate controlled

## 2020-06-27 NOTE — Assessment & Plan Note (Signed)
On Levothyroxine, continue to monitor 

## 2020-06-27 NOTE — Assessment & Plan Note (Signed)
Well controlled, no changes to meds. Encouraged heart healthy diet such as the DASH diet and exercise as tolerated.  °

## 2020-07-01 ENCOUNTER — Other Ambulatory Visit (HOSPITAL_BASED_OUTPATIENT_CLINIC_OR_DEPARTMENT_OTHER): Payer: Self-pay

## 2020-07-01 MED ORDER — PFIZER-BIONT COVID-19 VAC-TRIS 30 MCG/0.3ML IM SUSP
INTRAMUSCULAR | 0 refills | Status: DC
  Filled 2020-07-01: qty 0.3, 1d supply, fill #0

## 2020-07-08 ENCOUNTER — Ambulatory Visit (INDEPENDENT_AMBULATORY_CARE_PROVIDER_SITE_OTHER): Payer: Medicare Other | Admitting: Family Medicine

## 2020-07-08 ENCOUNTER — Encounter: Payer: Self-pay | Admitting: Family Medicine

## 2020-07-08 ENCOUNTER — Other Ambulatory Visit: Payer: Self-pay

## 2020-07-08 VITALS — BP 122/72 | HR 64 | Temp 98.0°F | Resp 16 | Wt 185.4 lb

## 2020-07-08 DIAGNOSIS — K529 Noninfective gastroenteritis and colitis, unspecified: Secondary | ICD-10-CM | POA: Diagnosis not present

## 2020-07-08 DIAGNOSIS — I10 Essential (primary) hypertension: Secondary | ICD-10-CM

## 2020-07-08 DIAGNOSIS — I4891 Unspecified atrial fibrillation: Secondary | ICD-10-CM

## 2020-07-08 DIAGNOSIS — Z79899 Other long term (current) drug therapy: Secondary | ICD-10-CM | POA: Diagnosis not present

## 2020-07-08 DIAGNOSIS — R109 Unspecified abdominal pain: Secondary | ICD-10-CM | POA: Insufficient documentation

## 2020-07-08 DIAGNOSIS — N390 Urinary tract infection, site not specified: Secondary | ICD-10-CM

## 2020-07-08 DIAGNOSIS — E7800 Pure hypercholesterolemia, unspecified: Secondary | ICD-10-CM

## 2020-07-08 DIAGNOSIS — L304 Erythema intertrigo: Secondary | ICD-10-CM

## 2020-07-08 DIAGNOSIS — R739 Hyperglycemia, unspecified: Secondary | ICD-10-CM

## 2020-07-08 LAB — CBC WITH DIFFERENTIAL/PLATELET
Basophils Absolute: 0 10*3/uL (ref 0.0–0.1)
Basophils Relative: 0.5 % (ref 0.0–3.0)
Eosinophils Absolute: 0.1 10*3/uL (ref 0.0–0.7)
Eosinophils Relative: 2.3 % (ref 0.0–5.0)
HCT: 38.8 % (ref 36.0–46.0)
Hemoglobin: 13.4 g/dL (ref 12.0–15.0)
Lymphocytes Relative: 14.6 % (ref 12.0–46.0)
Lymphs Abs: 0.7 10*3/uL (ref 0.7–4.0)
MCHC: 34.6 g/dL (ref 30.0–36.0)
MCV: 95.5 fl (ref 78.0–100.0)
Monocytes Absolute: 1 10*3/uL (ref 0.1–1.0)
Monocytes Relative: 20 % — ABNORMAL HIGH (ref 3.0–12.0)
Neutro Abs: 3 10*3/uL (ref 1.4–7.7)
Neutrophils Relative %: 62.6 % (ref 43.0–77.0)
Platelets: 174 10*3/uL (ref 150.0–400.0)
RBC: 4.06 Mil/uL (ref 3.87–5.11)
RDW: 13.9 % (ref 11.5–15.5)
WBC: 4.8 10*3/uL (ref 4.0–10.5)

## 2020-07-08 LAB — COMPREHENSIVE METABOLIC PANEL
ALT: 20 U/L (ref 0–35)
AST: 24 U/L (ref 0–37)
Albumin: 3.4 g/dL — ABNORMAL LOW (ref 3.5–5.2)
Alkaline Phosphatase: 68 U/L (ref 39–117)
BUN: 11 mg/dL (ref 6–23)
CO2: 31 mEq/L (ref 19–32)
Calcium: 8.9 mg/dL (ref 8.4–10.5)
Chloride: 97 mEq/L (ref 96–112)
Creatinine, Ser: 0.94 mg/dL (ref 0.40–1.20)
GFR: 54.64 mL/min — ABNORMAL LOW (ref >60.00)
Glucose, Bld: 93 mg/dL (ref 70–99)
Potassium: 3.5 mEq/L (ref 3.5–5.1)
Sodium: 135 mEq/L (ref 135–145)
Total Bilirubin: 1 mg/dL (ref 0.2–1.2)
Total Protein: 5.9 g/dL — ABNORMAL LOW (ref 6.0–8.3)

## 2020-07-08 NOTE — Assessment & Plan Note (Signed)
hgba1c acceptable, minimize simple carbs. Increase exercise as tolerated.  

## 2020-07-08 NOTE — Assessment & Plan Note (Deleted)
Under breasts was treated with nystatin powder by dermatology took a month to resolve but is better now.

## 2020-07-08 NOTE — Assessment & Plan Note (Signed)
Tolerating Eliquis and asynptomatic

## 2020-07-08 NOTE — Assessment & Plan Note (Signed)
Encouraged heart healthy diet, increase exercise, avoid trans fats, consider a krill oil cap daily. Tolerating Atorvastatin 

## 2020-07-08 NOTE — Progress Notes (Addendum)
Patient ID: Alyssa Williamson, female    DOB: Nov 19, 1931  Age: 87 y.o. MRN: [id number]    Subjective:  Subjective  HPI Alyssa Williamson presents for office visit today for having stomach problems and for follow up on HTN and arthritis. She reports that she had sudden diarrhea that is watery, nausea, and vomiting. She reports that she experienced those symptoms for about a week now and states that it has happened for multiple days in a row. She states that yesterday she was experiencing chills and her temperature was 99.8 degrees, then she experienced sweating, then she states that her fever went away. She states that after vomiting she feels a lot better. She states that after the episodes, she 2-3 bm's a day that are loose and that her heartburn would be resolved. She denies any chest pain, SOB, fever, abdominal pain, cough, chills, sore throat, dysuria, urinary incontinence, blood in stool, back pain, or HA. She states that sometimes she feels dizzy and light-headed and reports that after hydrating, she feels better.   Review of Systems  Constitutional: Negative for chills, fatigue and fever.  HENT: Negative for congestion, rhinorrhea, sinus pressure, sinus pain and sore throat.   Eyes: Negative for pain.  Respiratory: Negative for cough and shortness of breath.   Cardiovascular: Negative for chest pain, palpitations and leg swelling.  Gastrointestinal: Positive for diarrhea, nausea and vomiting. Negative for abdominal pain and blood in stool.  Genitourinary: Negative for decreased urine volume, dysuria, flank pain, frequency, vaginal bleeding and vaginal discharge.  Musculoskeletal: Negative for back pain.  Neurological: Negative for light-headedness and headaches.    History Past Medical History:  Diagnosis Date  . Allergic rhinitis   . Anemia   . Anxiety   . Arthritis    bilateral knee  . Atrial fibrillation (HCC) 12/21/2016  . Cervical cancer screening 10/30/2013   Menarche at 15 Regular  and moderate flow No history of abnormal pap in past G4P3, s/p 3 svd and 1 MC No history of abnormal MGM Noconcerns today No gyn surgeries  . Constipation 11/18/2015  . Depression with anxiety   . Diverticulitis   . Heart murmur   . Hyperlipidemia   . Hypertension   . Hypothyroid   . Left knee DJD   . Medicare annual wellness visit, subsequent 10/30/2013  . PONV (postoperative nausea and vomiting)   . Preventative health care 05/20/2016    She has a past surgical history that includes Replacement total knee (10/2010); bladder tack (04/2010); Abdominal hysterectomy; Tonsillectomy; Joint replacement (Right); Ganglion cyst excision; and Total knee arthroplasty (Left, 07/26/2012).   Her family history includes Alcohol abuse in her brother and father; Arthritis in her daughter; Breast cancer in her sister; Cancer in her brother, brother, brother, daughter, sister, sister, and sister; Diabetes in her daughter; Diverticulosis in her sister; Glaucoma in her brother, maternal grandfather, and sister; Heart failure in her mother; Mental illness in her daughter.She reports that she has never smoked. She has never used smokeless tobacco. She reports that she does not drink alcohol and does not use drugs.  Current Outpatient Medications on File Prior to Visit  Medication Sig Dispense Refill  . ALPRAZolam (XANAX) 0.25 MG tablet Take 1 tablet (0.25 mg total) by mouth daily as needed for anxiety. 30 tablet 1  . atorvastatin (LIPITOR) 40 MG tablet Take 1 tablet (40 mg total) by mouth daily. 90 tablet 1  . budesonide-formoterol (SYMBICORT) 160-4.5 MCG/ACT inhaler Inhale 2 puffs into the lungs 2 (two)  times daily. 1 each 3  . citalopram (CELEXA) 20 MG tablet Take 1 tablet (20 mg total) by mouth daily. 90 tablet 1  . COVID-19 mRNA Vac-TriS, Pfizer, (PFIZER-BIONT COVID-19 VAC-TRIS) SUSP injection Inject into the muscle. 0.3 mL 0  . Cranberry 500 MG CAPS Take 500 mg by mouth daily.    Marland Kitchen ELIQUIS 5 MG TABS tablet Take 1  tablet by mouth twice daily 180 tablet 1  . EUTHYROX 75 MCG tablet TAKE 1 TABLET BY MOUTH ONCE DAILY BEFORE BREAKFAST 90 tablet 0  . furosemide (LASIX) 20 MG tablet Take 1 tablet (20 mg total) by mouth daily. 90 tablet 1  . levocetirizine (XYZAL) 5 MG tablet Take 1 tablet (5 mg total) by mouth every evening. 30 tablet 3  . loratadine (CLARITIN) 10 MG tablet Take 10 mg by mouth daily.    . Multiple Vitamins-Minerals (CENTRUM SILVER PO) Take 1 tablet by mouth daily.    . verapamil (CALAN-SR) 240 MG CR tablet Take 1 tablet (240 mg total) by mouth daily. 90 tablet 1   No current facility-administered medications on file prior to visit.     Objective:  Objective  Physical Exam Constitutional:      General: She is not in acute distress.    Appearance: Normal appearance. She is not ill-appearing or toxic-appearing.  HENT:     Head: Normocephalic and atraumatic.     Right Ear: Tympanic membrane, ear canal and external ear normal.     Left Ear: Tympanic membrane, ear canal and external ear normal.     Nose: No congestion or rhinorrhea.  Eyes:     Extraocular Movements: Extraocular movements intact.     Pupils: Pupils are equal, round, and reactive to light.  Cardiovascular:     Rate and Rhythm: Normal rate and regular rhythm.     Pulses: Normal pulses.     Heart sounds: Normal heart sounds. No murmur heard.   Pulmonary:     Effort: Pulmonary effort is normal. No respiratory distress.     Breath sounds: Normal breath sounds. No wheezing, rhonchi or rales.  Abdominal:     General: Bowel sounds are normal.     Palpations: Abdomen is soft. There is no mass.     Tenderness: There is no abdominal tenderness. There is no guarding.     Hernia: No hernia is present.  Musculoskeletal:        General: Normal range of motion.     Cervical back: Normal range of motion and neck supple.  Skin:    General: Skin is warm and dry.  Neurological:     Mental Status: She is alert and oriented to  person, place, and time.  Psychiatric:        Behavior: Behavior normal.    BP 122/72   Pulse 64   Temp 98 F (36.7 C)   Resp 16   Wt 185 lb 6.4 oz (84.1 kg)   SpO2 93%   BMI 33.91 kg/m  Wt Readings from Last 3 Encounters:  07/08/20 185 lb 6.4 oz (84.1 kg)  06/09/20 187 lb 4.8 oz (85 kg)  04/25/20 188 lb 12.8 oz (85.6 kg)     Lab Results  Component Value Date   WBC 4.8 07/08/2020   HGB 13.4 07/08/2020   HCT 38.8 07/08/2020   PLT 174.0 07/08/2020   GLUCOSE 93 07/08/2020   CHOL 154 06/26/2020   TRIG 65.0 06/26/2020   HDL 73.90 06/26/2020   LDLCALC 67 06/26/2020  ALT 20 07/08/2020   AST 24 07/08/2020   NA 135 07/08/2020   K 3.5 07/08/2020   CL 97 07/08/2020   CREATININE 0.94 07/08/2020   BUN 11 07/08/2020   CO2 31 07/08/2020   TSH 0.37 06/26/2020   INR 0.99 07/24/2012   HGBA1C 5.9 06/26/2020    No results found.   Assessment & Plan:  Plan    No orders of the defined types were placed in this encounter.   Problem List Items Addressed This Visit    HTN (hypertension)    Well controlled, no changes to meds. Encouraged heart healthy diet such as the DASH diet and exercise as tolerated.       Hyperlipidemia    Encouraged heart healthy diet, increase exercise, avoid trans fats, consider a krill oil cap daily. Tolerating Atorvastatin      Recurrent UTI    Now with abdominal pain and chills after her GI symptoms began to resolve. Check UA and culture to evaluate      RESOLVED: Intertrigo   Hyperglycemia    hgba1c acceptable, minimize simple carbs. Increase exercise as tolerated.      Atrial fibrillation (HCC)    Tolerating Eliquis and asynptomatic      Abdominal pain    Much better today after having a very bad week with probable Norovirus gastroenteritis. A week ago she had a whole day of nausea and vomiting and diarrhea. Since then she has had nausea but that is it until yesterday when vomiting and diarrhea returned. Today she actually feels much  better. No N/V/D  And abdominal pain is much better today. She will let us know if her symptoms worsen again so we can consider further work up. Will proceed with blood work today      Relevant Orders   Urinalysis   Urine Culture    Other Visit Diagnoses    High risk medication use    -  Primary   Relevant Orders   DRUG MONITORING, PANEL 8 WITH CONFIRMATION, URINE   Gastroenteritis       Relevant Orders   CBC w/Diff (Completed)   Comprehensive metabolic panel (Completed)      Follow-up: No follow-ups on file.   I,David Hanna,acting as a scribe for Danise Edge, MD.,have documented all relevant documentation on the behalf of Danise Edge, MD,as directed by  Danise Edge, MD while in the presence of Danise Edge, MD.  I, Bradd Canary, MD personally performed the services described in this documentation. All medical record entries made by the scribe were at my direction and in my presence. I have reviewed the chart and agree that the record reflects my personal performance and is accurate and complete

## 2020-07-08 NOTE — Assessment & Plan Note (Signed)
Well controlled, no changes to meds. Encouraged heart healthy diet such as the DASH diet and exercise as tolerated.  °

## 2020-07-08 NOTE — Assessment & Plan Note (Signed)
Now with abdominal pain and chills after her GI symptoms began to resolve. Check UA and culture to evaluate

## 2020-07-08 NOTE — Patient Instructions (Signed)
Take probiotic daily for the next month or so Pepcid/Famotidine 20 mg daily in evening Norovirus Infection Norovirus infection causes inflammation in the stomach and intestines (gastroenteritis) and food poisoning. It is caused by exposure to a virus from a group of similar viruses called noroviruses. Norovirus spreads very easily from person to person (is very contagious). It often occurs in places where people are in close contact, such as schools, nursing homes, and cruise ships. You can get it from food, water, surfaces, or other people who have the virus. Norovirus is also found in the stool (feces) or vomit of infected people. You can spread the infection as soon as you feel sick, and you may continue to be contagious after you recover. What are the causes? This condition is caused by contact with norovirus. You can catch norovirus if you:  Eat or drink something that is contaminated with norovirus.  Touch surfaces or objects that are contaminated with norovirus and then put your hand in or by your mouth or nose.  Have direct contact with an infected person who has symptoms.  Share food, drink, or utensils with someone who is sick with norovirus. What are the signs or symptoms? Symptoms usually begin within 12 hours to 2 days after you become infected. Most norovirus symptoms affect the digestive system.Symptoms may include:  Nausea, vomiting, and diarrhea.  Stomach cramps.  Fever.  Chills.  Headache.  Muscle aches and tiredness. How is this diagnosed? This condition may be diagnosed based on:  Your symptoms.  A physical exam.  A stool test. How is this treated? There is no specific treatment for norovirus. Most people get better without treatment in about 2 days. Young children, the elderly, and people who are already sick may take up to 6 days to recover. Follow these instructions at home: Eating and drinking  Drink plenty of water to replace fluids that are lost  through diarrhea and vomiting. This prevents dehydration. Drink enough fluid to keep your urine clear or pale yellow.  Drink clear fluids in small amounts as you are able. Clear fluids include water, ice chips, fruit juice with water added (diluted fruit juice), and low-calorie sports drinks. ? Avoid fluids that contain a lot of sugar or caffeine, such as energy drinks, sports drinks, and soda. ? Avoid alcohol.  If instructed by your health care provider, drink an oral rehydration solution (ORS). This is a drink that is sold at pharmacies and retail stores. An ORS contains minerals (electrolytes) that you can lose through diarrhea and vomiting.  Eat bland, easy-to-digest foods in small amounts as you are able. These foods include bananas, applesauce, rice, lean meats, toast, and crackers. ? Avoid spicy or fatty foods.   General instructions  Rest at home while you recover.  Do not prepare food for others while you are infected. Wait at least 3 days after you recover from the illness to do this.  Take over-the-counter and prescription medicines only as told by your health care provider.  Wash your hands frequently with soap and water. If soap and water are not available, use hand sanitizer.  Make sure that all people in your household wash their hands well and often.  Keep all follow-up visits as told by your health care provider. This is important.   How is this prevented? To help prevent the spread of norovirus:  Stay at home if you are feeling sick. This will reduce the risk of spreading the virus to others.  Wash your hands  often with soap and water for at least 20 seconds, especially after using the toilet or changing a diaper.  Wash fruits and vegetables thoroughly before peeling, preparing, or serving them.  Throw out any food that a sick person may have touched.  Disinfect contaminated surfaces immediately after someone in the household has been sick. Use a bleach-based  household cleaner.  Immediately remove and wash soiled clothes or sheets. Contact a health care provider if:  You have vomiting, diarrhea, or stomach pain that gets worse.  You have symptoms that do not go away after 3-6 days.  You have a fever.  You cannot drink without vomiting.  You feel light-headed or dizzy.  Your symptoms get worse. Get help right away if: You develop symptoms of dehydration that do not improve with fluid replacement, such as:  Excessive sleepiness.  Lack of tears.  Very little urine production.  Dry mouth.  Muscle cramps.  Weak pulse.  Confusion. Summary  Norovirus infection is common and often occurs in places where people are in close contact, such as schools, nursing homes, and cruise ships.  To help prevent the spread of this infection, wash hands with soap and water for at least 20 seconds before handling food or after having contact with stool or body fluids.  There is no specific treatment for norovirus, but most people get better without treatment in about 2 days. People who are healthy when infected often recover sooner than those who are elderly, young, or already sick.  Replace lost fluids by drinking plenty of water, or by drinking oral rehydration solution (ORS), which contains important minerals called electrolytes. This prevents dehydration. This information is not intended to replace advice given to you by your health care provider. Make sure you discuss any questions you have with your health care provider. Document Revised: 10/12/2019 Document Reviewed: 10/12/2019 Elsevier Patient Education  2021 [employer]  for 1-2 weeks if heartburn

## 2020-07-08 NOTE — Assessment & Plan Note (Signed)
Much better today after having a very bad week with probable Norovirus gastroenteritis. A week ago she had a whole day of nausea and vomiting and diarrhea. Since then she has had nausea but that is it until yesterday when vomiting and diarrhea returned. Today she actually feels much better. No N/V/D  And abdominal pain is much better today. She will let us know if her symptoms worsen again so we can consider further work up. Will proceed with blood work today

## 2020-07-09 ENCOUNTER — Other Ambulatory Visit: Payer: Medicare Other

## 2020-07-14 ENCOUNTER — Encounter (HOSPITAL_BASED_OUTPATIENT_CLINIC_OR_DEPARTMENT_OTHER): Payer: Self-pay | Admitting: Emergency Medicine

## 2020-07-14 ENCOUNTER — Other Ambulatory Visit: Payer: Self-pay

## 2020-07-14 ENCOUNTER — Emergency Department (HOSPITAL_BASED_OUTPATIENT_CLINIC_OR_DEPARTMENT_OTHER): Payer: Medicare Other

## 2020-07-14 ENCOUNTER — Inpatient Hospital Stay (HOSPITAL_BASED_OUTPATIENT_CLINIC_OR_DEPARTMENT_OTHER)
Admission: EM | Admit: 2020-07-14 | Discharge: 2020-07-20 | DRG: 417 | Disposition: A | Discharge status: Home / Self Care | Payer: Medicare Other | Attending: Internal Medicine | Admitting: Internal Medicine

## 2020-07-14 DIAGNOSIS — Z79890 Hormone replacement therapy (postmenopausal): Secondary | ICD-10-CM

## 2020-07-14 DIAGNOSIS — R7989 Other specified abnormal findings of blood chemistry: Secondary | ICD-10-CM | POA: Diagnosis not present

## 2020-07-14 DIAGNOSIS — Z96653 Presence of artificial knee joint, bilateral: Secondary | ICD-10-CM | POA: Diagnosis present

## 2020-07-14 DIAGNOSIS — K851 Biliary acute pancreatitis without necrosis or infection: Principal | ICD-10-CM

## 2020-07-14 DIAGNOSIS — R0602 Shortness of breath: Secondary | ICD-10-CM

## 2020-07-14 DIAGNOSIS — R0902 Hypoxemia: Secondary | ICD-10-CM | POA: Diagnosis present

## 2020-07-14 DIAGNOSIS — Z01810 Encounter for preprocedural cardiovascular examination: Secondary | ICD-10-CM | POA: Diagnosis not present

## 2020-07-14 DIAGNOSIS — Z803 Family history of malignant neoplasm of breast: Secondary | ICD-10-CM | POA: Diagnosis not present

## 2020-07-14 DIAGNOSIS — I480 Paroxysmal atrial fibrillation: Secondary | ICD-10-CM | POA: Diagnosis present

## 2020-07-14 DIAGNOSIS — Z7951 Long term (current) use of inhaled steroids: Secondary | ICD-10-CM

## 2020-07-14 DIAGNOSIS — I5033 Acute on chronic diastolic (congestive) heart failure: Secondary | ICD-10-CM

## 2020-07-14 DIAGNOSIS — F419 Anxiety disorder, unspecified: Secondary | ICD-10-CM | POA: Diagnosis present

## 2020-07-14 DIAGNOSIS — I110 Hypertensive heart disease with heart failure: Secondary | ICD-10-CM | POA: Diagnosis present

## 2020-07-14 DIAGNOSIS — Z20822 Contact with and (suspected) exposure to covid-19: Secondary | ICD-10-CM | POA: Diagnosis present

## 2020-07-14 DIAGNOSIS — E039 Hypothyroidism, unspecified: Secondary | ICD-10-CM | POA: Diagnosis present

## 2020-07-14 DIAGNOSIS — E7800 Pure hypercholesterolemia, unspecified: Secondary | ICD-10-CM | POA: Diagnosis not present

## 2020-07-14 DIAGNOSIS — F32A Depression, unspecified: Secondary | ICD-10-CM

## 2020-07-14 DIAGNOSIS — Z808 Family history of malignant neoplasm of other organs or systems: Secondary | ICD-10-CM

## 2020-07-14 DIAGNOSIS — K819 Cholecystitis, unspecified: Secondary | ICD-10-CM | POA: Diagnosis present

## 2020-07-14 DIAGNOSIS — Z7901 Long term (current) use of anticoagulants: Secondary | ICD-10-CM | POA: Diagnosis not present

## 2020-07-14 DIAGNOSIS — K8050 Calculus of bile duct without cholangitis or cholecystitis without obstruction: Secondary | ICD-10-CM | POA: Diagnosis not present

## 2020-07-14 DIAGNOSIS — Z8249 Family history of ischemic heart disease and other diseases of the circulatory system: Secondary | ICD-10-CM | POA: Diagnosis not present

## 2020-07-14 DIAGNOSIS — N390 Urinary tract infection, site not specified: Secondary | ICD-10-CM | POA: Diagnosis present

## 2020-07-14 DIAGNOSIS — Z9049 Acquired absence of other specified parts of digestive tract: Secondary | ICD-10-CM

## 2020-07-14 DIAGNOSIS — E785 Hyperlipidemia, unspecified: Secondary | ICD-10-CM | POA: Diagnosis present

## 2020-07-14 DIAGNOSIS — I482 Chronic atrial fibrillation, unspecified: Secondary | ICD-10-CM | POA: Diagnosis present

## 2020-07-14 DIAGNOSIS — K8066 Calculus of gallbladder and bile duct with acute and chronic cholecystitis without obstruction: Secondary | ICD-10-CM | POA: Diagnosis present

## 2020-07-14 DIAGNOSIS — Z833 Family history of diabetes mellitus: Secondary | ICD-10-CM | POA: Diagnosis not present

## 2020-07-14 DIAGNOSIS — Z87440 Personal history of urinary (tract) infections: Secondary | ICD-10-CM | POA: Diagnosis not present

## 2020-07-14 DIAGNOSIS — I4891 Unspecified atrial fibrillation: Secondary | ICD-10-CM | POA: Diagnosis not present

## 2020-07-14 DIAGNOSIS — R1011 Right upper quadrant pain: Secondary | ICD-10-CM

## 2020-07-14 DIAGNOSIS — Z66 Do not resuscitate: Secondary | ICD-10-CM | POA: Diagnosis present

## 2020-07-14 DIAGNOSIS — N179 Acute kidney failure, unspecified: Secondary | ICD-10-CM | POA: Diagnosis not present

## 2020-07-14 DIAGNOSIS — R17 Unspecified jaundice: Secondary | ICD-10-CM

## 2020-07-14 DIAGNOSIS — R062 Wheezing: Secondary | ICD-10-CM

## 2020-07-14 DIAGNOSIS — I5032 Chronic diastolic (congestive) heart failure: Secondary | ICD-10-CM | POA: Diagnosis not present

## 2020-07-14 DIAGNOSIS — Z96652 Presence of left artificial knee joint: Secondary | ICD-10-CM | POA: Diagnosis present

## 2020-07-14 DIAGNOSIS — I10 Essential (primary) hypertension: Secondary | ICD-10-CM | POA: Diagnosis present

## 2020-07-14 DIAGNOSIS — K831 Obstruction of bile duct: Secondary | ICD-10-CM

## 2020-07-14 DIAGNOSIS — K8047 Calculus of bile duct with acute and chronic cholecystitis with obstruction: Secondary | ICD-10-CM | POA: Diagnosis present

## 2020-07-14 LAB — RESP PANEL BY RT-PCR (FLU A&B, COVID) ARPGX2
Influenza A by PCR: NEGATIVE
Influenza B by PCR: NEGATIVE
SARS Coronavirus 2 by RT PCR: NEGATIVE

## 2020-07-14 LAB — CBC
HCT: 39.4 % (ref 36.0–46.0)
Hemoglobin: 13.6 g/dL (ref 12.0–15.0)
MCH: 32.9 pg (ref 26.0–34.0)
MCHC: 34.5 g/dL (ref 30.0–36.0)
MCV: 95.4 fL (ref 80.0–100.0)
Platelets: 201 10*3/uL (ref 150–400)
RBC: 4.13 MIL/uL (ref 3.87–5.11)
RDW: 13.2 % (ref 11.5–15.5)
WBC: 12.9 10*3/uL — ABNORMAL HIGH (ref 4.0–10.5)
nRBC: 0 % (ref 0.0–0.2)

## 2020-07-14 LAB — URINALYSIS, ROUTINE W REFLEX MICROSCOPIC
Bilirubin Urine: NEGATIVE
Glucose, UA: NEGATIVE mg/dL
Ketones, ur: NEGATIVE mg/dL
Nitrite: POSITIVE — AB
Protein, ur: NEGATIVE mg/dL
Specific Gravity, Urine: <1.005 — ABNORMAL LOW (ref 1.005–1.030)
pH: 7 (ref 5.0–8.0)

## 2020-07-14 LAB — COMPREHENSIVE METABOLIC PANEL
ALT: 383 U/L — ABNORMAL HIGH (ref 0–44)
AST: 636 U/L — ABNORMAL HIGH (ref 15–41)
Albumin: 3.2 g/dL — ABNORMAL LOW (ref 3.5–5.0)
Alkaline Phosphatase: 119 U/L (ref 38–126)
Anion gap: 10 (ref 5–15)
BUN: 17 mg/dL (ref 8–23)
CO2: 26 mmol/L (ref 22–32)
Calcium: 8.4 mg/dL — ABNORMAL LOW (ref 8.9–10.3)
Chloride: 100 mmol/L (ref 98–111)
Creatinine, Ser: 0.89 mg/dL (ref 0.44–1.00)
GFR, Estimated: >60 mL/min (ref >60)
Glucose, Bld: 108 mg/dL — ABNORMAL HIGH (ref 70–99)
Potassium: 3.7 mmol/L (ref 3.5–5.1)
Sodium: 136 mmol/L (ref 135–145)
Total Bilirubin: 1.9 mg/dL — ABNORMAL HIGH (ref 0.3–1.2)
Total Protein: 6.7 g/dL (ref 6.5–8.1)

## 2020-07-14 LAB — URINALYSIS, MICROSCOPIC (REFLEX)

## 2020-07-14 LAB — BRAIN NATRIURETIC PEPTIDE: B Natriuretic Peptide: 759.2 pg/mL — ABNORMAL HIGH (ref 0.0–100.0)

## 2020-07-14 LAB — LIPASE, BLOOD: Lipase: 543 U/L — ABNORMAL HIGH (ref 11–51)

## 2020-07-14 MED ORDER — ONDANSETRON HCL 4 MG/2ML IJ SOLN
4.0000 mg | Freq: Once | INTRAMUSCULAR | Status: AC
Start: 2020-07-14 — End: 2020-07-14
  Administered 2020-07-14: 4 mg via INTRAVENOUS
  Filled 2020-07-14: qty 2

## 2020-07-14 MED ORDER — VERAPAMIL HCL ER 240 MG PO TBCR
240.0000 mg | EXTENDED_RELEASE_TABLET | Freq: Every day | ORAL | Status: DC
  Administered 2020-07-14 – 2020-07-20 (×6): 240 mg via ORAL
  Filled 2020-07-14 (×8): qty 1

## 2020-07-14 MED ORDER — FENTANYL CITRATE (PF) 100 MCG/2ML IJ SOLN
50.0000 ug | Freq: Once | INTRAMUSCULAR | Status: AC
  Administered 2020-07-14: 50 ug via INTRAVENOUS
  Filled 2020-07-14: qty 2

## 2020-07-14 MED ORDER — CIPROFLOXACIN IN D5W 400 MG/200ML IV SOLN
400.0000 mg | Freq: Two times a day (BID) | INTRAVENOUS | Status: DC
  Administered 2020-07-14 – 2020-07-15 (×2): 400 mg via INTRAVENOUS
  Filled 2020-07-14 (×2): qty 200

## 2020-07-14 MED ORDER — ATORVASTATIN CALCIUM 40 MG PO TABS
40.0000 mg | ORAL_TABLET | Freq: Every day | ORAL | Status: DC
  Administered 2020-07-14: 40 mg via ORAL
  Filled 2020-07-14: qty 1

## 2020-07-14 MED ORDER — CIPROFLOXACIN IN D5W 400 MG/200ML IV SOLN
400.0000 mg | Freq: Once | INTRAVENOUS | Status: AC
  Administered 2020-07-14: 400 mg via INTRAVENOUS
  Filled 2020-07-14: qty 200

## 2020-07-14 MED ORDER — HYDROCODONE-ACETAMINOPHEN 5-325 MG PO TABS
1.0000 | ORAL_TABLET | ORAL | Status: DC | PRN
  Administered 2020-07-14 – 2020-07-15 (×3): 1 via ORAL
  Filled 2020-07-14 (×3): qty 1

## 2020-07-14 MED ORDER — FUROSEMIDE 10 MG/ML IJ SOLN
40.0000 mg | Freq: Two times a day (BID) | INTRAMUSCULAR | Status: AC
  Administered 2020-07-14: 40 mg via INTRAVENOUS
  Filled 2020-07-14 (×2): qty 4

## 2020-07-14 MED ORDER — ALPRAZOLAM 0.25 MG PO TABS
0.2500 mg | ORAL_TABLET | Freq: Every day | ORAL | Status: DC | PRN
  Administered 2020-07-15: 0.25 mg via ORAL
  Filled 2020-07-14: qty 1

## 2020-07-14 MED ORDER — SODIUM CHLORIDE 0.9 % IV BOLUS
500.0000 mL | Freq: Once | INTRAVENOUS | Status: AC
Start: 2020-07-14 — End: 2020-07-14
  Administered 2020-07-14: 500 mL via INTRAVENOUS

## 2020-07-14 MED ORDER — FLUTICASONE FUROATE-VILANTEROL 200-25 MCG/INH IN AEPB
1.0000 | INHALATION_SPRAY | Freq: Every day | RESPIRATORY_TRACT | Status: DC
  Filled 2020-07-14 (×2): qty 28

## 2020-07-14 MED ORDER — SODIUM CHLORIDE 0.9 % IV SOLN: INTRAVENOUS | Status: DC

## 2020-07-14 MED ORDER — LEVOTHYROXINE SODIUM 75 MCG PO TABS: 75.0000 ug | ORAL_TABLET | Freq: Every day | ORAL | Status: DC

## 2020-07-14 MED ORDER — ONDANSETRON HCL 4 MG/2ML IJ SOLN
4.0000 mg | Freq: Four times a day (QID) | INTRAMUSCULAR | Status: DC | PRN
Start: 2020-07-14 — End: 2020-07-20
  Administered 2020-07-18: 4 mg via INTRAVENOUS
  Filled 2020-07-14: qty 2

## 2020-07-14 MED ORDER — METRONIDAZOLE 500 MG/100ML IV SOLN: 500.0000 mg | Freq: Three times a day (TID) | INTRAVENOUS | Status: DC

## 2020-07-14 MED ORDER — ONDANSETRON HCL 4 MG PO TABS: 4.0000 mg | ORAL_TABLET | Freq: Four times a day (QID) | ORAL | Status: DC | PRN

## 2020-07-14 MED ORDER — LEVOTHYROXINE SODIUM 50 MCG PO TABS
75.0000 ug | ORAL_TABLET | Freq: Every day | ORAL | Status: DC
  Administered 2020-07-15 – 2020-07-20 (×5): 75 ug via ORAL
  Filled 2020-07-14 (×5): qty 1

## 2020-07-14 MED ORDER — CITALOPRAM HYDROBROMIDE 20 MG PO TABS
20.0000 mg | ORAL_TABLET | Freq: Every day | ORAL | Status: DC
  Administered 2020-07-14: 20 mg via ORAL
  Filled 2020-07-14: qty 1

## 2020-07-14 MED ORDER — LIP MEDEX EX OINT
TOPICAL_OINTMENT | CUTANEOUS | Status: DC | PRN
  Filled 2020-07-14 (×2): qty 7

## 2020-07-14 MED ORDER — METOPROLOL TARTRATE 5 MG/5ML IV SOLN: 5.0000 mg | Freq: Four times a day (QID) | INTRAVENOUS | Status: DC | PRN

## 2020-07-14 MED ORDER — MORPHINE SULFATE (PF) 2 MG/ML IV SOLN
2.0000 mg | INTRAVENOUS | Status: DC | PRN
  Administered 2020-07-15: 2 mg via INTRAVENOUS
  Filled 2020-07-14: qty 1

## 2020-07-14 MED ORDER — ACETAMINOPHEN 650 MG RE SUPP: 650.0000 mg | Freq: Four times a day (QID) | RECTAL | Status: DC | PRN

## 2020-07-14 MED ORDER — METRONIDAZOLE 500 MG/100ML IV SOLN
500.0000 mg | Freq: Once | INTRAVENOUS | Status: AC
  Administered 2020-07-14: 500 mg via INTRAVENOUS
  Filled 2020-07-14: qty 100

## 2020-07-14 MED ORDER — METRONIDAZOLE 500 MG/100ML IV SOLN
500.0000 mg | Freq: Three times a day (TID) | INTRAVENOUS | Status: DC
  Administered 2020-07-14 – 2020-07-15 (×2): 500 mg via INTRAVENOUS
  Filled 2020-07-14 (×2): qty 100

## 2020-07-14 MED ORDER — HEPARIN SODIUM (PORCINE) 5000 UNIT/ML IJ SOLN
5000.0000 [IU] | Freq: Three times a day (TID) | INTRAMUSCULAR | Status: AC
  Administered 2020-07-14 – 2020-07-19 (×10): 5000 [IU] via SUBCUTANEOUS
  Filled 2020-07-14 (×12): qty 1

## 2020-07-14 MED ORDER — ACETAMINOPHEN 325 MG PO TABS: 650.0000 mg | ORAL_TABLET | Freq: Four times a day (QID) | ORAL | Status: DC | PRN

## 2020-07-14 MED ORDER — CIPROFLOXACIN IN D5W 400 MG/200ML IV SOLN: 400.0000 mg | Freq: Two times a day (BID) | INTRAVENOUS | Status: DC

## 2020-07-14 NOTE — ED Notes (Signed)
[facility] at bedside to transport pt to [facility].

## 2020-07-14 NOTE — Plan of Care (Signed)

## 2020-07-14 NOTE — ED Provider Notes (Signed)
[facility] EMERGENCY DEPARTMENT Provider Note   CSN: [id number] Arrival date & time: 07/14/20  0757     History Chief Complaint  Patient presents with  . Emesis    Alyssa Williamson is a 87 y.o. female.  She has a history of A. fib and is on Eliquis.  She is complaining of being sick for almost 2 weeks with vomiting right-sided abdominal pain diarrhea.  Had a low-grade fever.  Saw her PCP when symptoms seem to be improving.  Was on antibiotics a few weeks ago for possible UTI.  No blood in the vomitus or diarrhea.  Since her right side feels sore 7 out of 10 intensity and she is worried it could be her gallbladder.  The history is provided by the patient.  Emesis Severity:  Moderate Duration:  2 weeks Timing:  Intermittent Quality:  Unable to specify Progression:  Unchanged Chronicity:  New Recent urination:  Normal Relieved by:  Nothing Worsened by:  Nothing Ineffective treatments:  None tried Associated symptoms: abdominal pain, diarrhea and fever   Associated symptoms: no headaches and no sore throat   Abdominal pain:    Location:  RUQ   Quality: aching     Severity:  Moderate   Onset quality:  Gradual   Timing:  Intermittent   Progression:  Unchanged      Past Medical History:  Diagnosis Date  . Allergic rhinitis   . Anemia   . Anxiety   . Arthritis    bilateral knee  . Atrial fibrillation (HCC) 12/21/2016  . Cervical cancer screening 10/30/2013   Menarche at 15 Regular and moderate flow No history of abnormal pap in past G4P3, s/p 3 svd and 1 MC No history of abnormal MGM Noconcerns today No gyn surgeries  . Constipation 11/18/2015  . Depression with anxiety   . Diverticulitis   . Heart murmur   . Hyperlipidemia   . Hypertension   . Hypothyroid   . Left knee DJD   . Medicare annual wellness visit, subsequent 10/30/2013  . PONV (postoperative nausea and vomiting)   . Preventative health care 05/20/2016    Patient Active Problem List   Diagnosis  Date Noted  . Abdominal pain 07/08/2020  . Right shoulder pain 06/27/2020  . Fall 07/10/2017  . PND (post-nasal drip) 04/04/2017  . Hypocalcemia 12/26/2016  . Atrial fibrillation (HCC) 12/21/2016  . Preventative health care 05/20/2016  . Constipation 11/18/2015  . History of thyroid nodule 07/24/2014  . Allergic rhinitis 07/05/2014  . Atypical chest pain 11/04/2013  . Other malaise and fatigue 10/30/2013  . Medicare annual wellness visit, subsequent 10/30/2013  . Cervical cancer screening 10/30/2013  . Hyperglycemia 05/23/2013  . Hyponatremia 07/28/2012  . Diverticulitis   . Arthritis   . Heart murmur   . Anxiety   . Anemia   . Arthritis of knee 05/24/2012  . Hypothyroidism 06/27/2011  . HTN (hypertension) 06/27/2011  . Hyperlipidemia 06/27/2011  . Recurrent UTI 06/27/2011  . Murmur 06/27/2011    Past Surgical History:  Procedure Laterality Date  . ABDOMINAL HYSTERECTOMY    . bladder tack  04/2010   Dr Marciano Sequin  . GANGLION CYST EXCISION    . JOINT REPLACEMENT Right   . REPLACEMENT TOTAL KNEE  10/2010   right knee-Murphy   . TONSILLECTOMY    . TOTAL KNEE ARTHROPLASTY Left 07/26/2012   Procedure: TOTAL KNEE ARTHROPLASTY;  Surgeon: Loreta Ave, MD;  Location: [facility] OR;  Service: Orthopedics;  Laterality: Left;     OB History   No obstetric history on file.     Family History  Problem Relation Age of Onset  . Alcohol abuse Father   . Breast cancer Sister   . Cancer Sister        bladder  . Glaucoma Sister   . Arthritis Daughter   . Diabetes Daughter        type 2  . Cancer Daughter        brain cancer, diagnosed 29 years.agp  . Mental illness Daughter        s/p craniotomies, gamma knife for tumors. numerous shunts.  . Heart failure Mother   . Cancer Brother        glioblastoma   . Glaucoma Brother   . Diverticulosis Sister   . Cancer Sister        liver  . Cancer Brother        melanoma, mets to lung and brain and intestine  . Alcohol abuse  Brother   . Cancer Brother        liver  . Glaucoma Maternal Grandfather   . Cancer Sister        breast  . Colon cancer Neg Hx   . Hypertension Neg Hx     Social History   Tobacco Use  . Smoking status: Never Smoker  . Smokeless tobacco: Never Used  Vaping Use  . Vaping Use: Never used  Substance Use Topics  . Alcohol use: No  . Drug use: No    Home Medications Prior to Admission medications   Medication Sig Start Date End Date Taking? Authorizing Provider  ALPRAZolam (XANAX) 0.25 MG tablet Take 1 tablet (0.25 mg total) by mouth daily as needed for anxiety. 06/05/20   Bradd Canary, MD  atorvastatin (LIPITOR) 40 MG tablet Take 1 tablet (40 mg total) by mouth daily. 02/25/20   Bradd Canary, MD  budesonide-formoterol [facility] 160-4.5 MCG/ACT inhaler Inhale 2 puffs into the lungs 2 (two) times daily. 04/25/20   Saguier, Ramon Dredge, PA-C  citalopram (CELEXA) 20 MG tablet Take 1 tablet (20 mg total) by mouth daily. 03/25/20   Bradd Canary, MD  COVID-19 mRNA Vac-TriS, Pfizer, (PFIZER-BIONT COVID-19 VAC-TRIS) SUSP injection Inject into the muscle. 06/26/20   Judyann Munson, MD  Cranberry 500 MG CAPS Take 500 mg by mouth daily.    [provider]  ELIQUIS 5 MG TABS tablet Take 1 tablet by mouth twice daily 05/26/20   Lewayne Bunting, MD  EUTHYROX 75 MCG tablet TAKE 1 TABLET BY MOUTH ONCE DAILY BEFORE BREAKFAST 05/05/20   Bradd Canary, MD  furosemide (LASIX) 20 MG tablet Take 1 tablet (20 mg total) by mouth daily. 01/29/20   Bradd Canary, MD  levocetirizine (XYZAL) 5 MG tablet Take 1 tablet (5 mg total) by mouth every evening. 04/25/20   Saguier, Ramon Dredge, PA-C  loratadine (CLARITIN) 10 MG tablet Take 10 mg by mouth daily.    [provider]  Multiple Vitamins-Minerals (CENTRUM SILVER PO) Take 1 tablet by mouth daily.    [provider]  verapamil (CALAN-SR) 240 MG CR tablet Take 1 tablet (240 mg total) by mouth daily. 03/25/20   Bradd Canary, MD     Allergies    Cefdinir and Codeine  Review of Systems   Review of Systems  Constitutional: Positive for fever.  HENT: Negative for sore throat.   Eyes: Negative for visual disturbance.  Respiratory: Negative for shortness  of breath.   Cardiovascular: Negative for chest pain.  Gastrointestinal: Positive for abdominal pain, diarrhea and vomiting.  Genitourinary: Negative for dysuria.  Musculoskeletal: Negative for neck pain.  Skin: Negative for rash.  Neurological: Negative for headaches.    Physical Exam Updated Vital Signs BP (!) 153/58 (BP Location: Right Arm)   Pulse 75   Temp 98.6 F (37 C) (Oral)   Resp 20   SpO2 93%   Physical Exam Vitals and nursing note reviewed.  Constitutional:      General: She is not in acute distress.    Appearance: Normal appearance. She is well-developed.  HENT:     Head: Normocephalic and atraumatic.  Eyes:     Conjunctiva/sclera: Conjunctivae normal.  Cardiovascular:     Rate and Rhythm: Normal rate and regular rhythm.     Heart sounds: No murmur heard.   Pulmonary:     Effort: Pulmonary effort is normal. No respiratory distress.     Breath sounds: Normal breath sounds.  Abdominal:     Palpations: Abdomen is soft.     Tenderness: There is abdominal tenderness. There is no guarding or rebound.  Musculoskeletal:        General: No signs of injury.     Cervical back: Neck supple.     Right lower leg: Edema present.     Left lower leg: Edema present.  Skin:    General: Skin is warm and dry.  Neurological:     General: No focal deficit present.     Mental Status: She is alert.     ED Results / Procedures / Treatments   Labs (all labs ordered are listed, but only abnormal results are displayed) Labs Reviewed  LIPASE, BLOOD - Abnormal; Notable for the following components:      Result Value   Lipase 543 (*)    All other components within normal limits  COMPREHENSIVE METABOLIC PANEL - Abnormal; Notable for the following  components:   Glucose, Bld 108 (*)    Calcium 8.4 (*)    Albumin 3.2 (*)    AST 636 (*)    ALT 383 (*)    Total Bilirubin 1.9 (*)    All other components within normal limits  CBC - Abnormal; Notable for the following components:   WBC 12.9 (*)    All other components within normal limits  URINALYSIS, ROUTINE W REFLEX MICROSCOPIC - Abnormal; Notable for the following components:   APPearance TURBID (*)    Specific Gravity, Urine <1.005 (*)    Hgb urine dipstick TRACE (*)    Nitrite POSITIVE (*)    Leukocytes,Ua LARGE (*)    All other components within normal limits  URINALYSIS, MICROSCOPIC (REFLEX) - Abnormal; Notable for the following components:   Bacteria, UA MANY (*)    Non Squamous Epithelial PRESENT (*)    All other components within normal limits  RESP PANEL BY RT-PCR (FLU A&B, COVID) ARPGX2  CBC  COMPREHENSIVE METABOLIC PANEL  LIPASE, BLOOD  BRAIN NATRIURETIC PEPTIDE    EKG None  Radiology US Abdomen Limited RUQ (LIVER/GB)  Result Date: 07/14/2020 CLINICAL DATA:  Right upper quadrant pain, nausea, and vomiting. EXAM: ULTRASOUND ABDOMEN LIMITED RIGHT UPPER QUADRANT COMPARISON:  Ultrasound abdomen 10/22/2011 FINDINGS: Gallbladder: Sludge noted within the gallbladder lumen. Mild pericholecystic fluid. No gallbladder wall thickening sonographic Eulah Pont sign is positive per technologist. Common bile duct: Diameter: 7 mm Liver: No focal lesion identified. Within normal limits in parenchymal echogenicity. Portal vein is patent on color  Doppler imaging with normal direction of blood flow towards the liver. Other: None. IMPRESSION: Minimal pericholecystic fluid. Gallbladder sludge. Sonographic Eulah Pont sign is positive per technologist. Findings are suspicious for low-level cholecystitis or chronic cholecystitis. Further evaluation with HIDA scan would be beneficial. Electronically Signed   By: Acquanetta Belling M.D.   On: 07/14/2020 10:45    Procedures Procedures   Medications  Ordered in ED Medications  ciprofloxacin (CIPRO) IVPB 400 mg (has no administration in time range)    And  metroNIDAZOLE (FLAGYL) IVPB 500 mg (has no administration in time range)  verapamil (CALAN-SR) CR tablet 240 mg (has no administration in time range)  ALPRAZolam (XANAX) tablet 0.25 mg (has no administration in time range)  fluticasone furoate-vilanterol (BREO ELLIPTA) 200-25 MCG/INH 1 puff (has no administration in time range)  acetaminophen (TYLENOL) tablet 650 mg (has no administration in time range)    Or  acetaminophen (TYLENOL) suppository 650 mg (has no administration in time range)  morphine 2 MG/ML injection 2 mg (has no administration in time range)  heparin injection 5,000 Units (has no administration in time range)  HYDROcodone-acetaminophen (NORCO/VICODIN) 5-325 MG per tablet 1 tablet (has no administration in time range)  ondansetron (ZOFRAN) tablet 4 mg (has no administration in time range)    Or  ondansetron (ZOFRAN) injection 4 mg (has no administration in time range)  metoprolol tartrate (LOPRESSOR) injection 5 mg (has no administration in time range)  furosemide (LASIX) injection 40 mg (has no administration in time range)  levothyroxine (SYNTHROID) tablet 75 mcg (has no administration in time range)  sodium chloride 0.9 % bolus 500 mL (0 mLs Intravenous Stopped 07/14/20 1134)  ondansetron (ZOFRAN) injection 4 mg (4 mg Intravenous Given 07/14/20 0854)  fentaNYL (SUBLIMAZE) injection 50 mcg (50 mcg Intravenous Given 07/14/20 0853)  ciprofloxacin (CIPRO) IVPB 400 mg (0 mg Intravenous Stopped 07/14/20 1312)    And  metroNIDAZOLE (FLAGYL) IVPB 500 mg (500 mg Intravenous New Bag/Given 07/14/20 1310)  fentaNYL (SUBLIMAZE) injection 50 mcg (50 mcg Intravenous Given 07/14/20 1336)    ED Course  I have reviewed the triage vital signs and the nursing notes.  Pertinent labs & imaging results that were available during my care of the patient were reviewed by me and considered in  my medical decision making (see chart for details).  Clinical Course as of 07/14/20 1817  Mon Jul 14, 2020  1048  IMPRESSION: Minimal pericholecystic fluid. Gallbladder sludge. Sonographic Eulah Pont sign is positive per technologist. Findings are suspicious for low-level cholecystitis or chronic cholecystitis. Further evaluation with HIDA scan would be beneficial. [MB]  1105 Discussed with [facility] Riley Kill.  She said she would see the patient when she gets over to [facility].  May need further imaging. [MB]  1119 Discussed with Dr. Dairl Ponder [facility] hospital[facility].  He will put her in for a bed.  He said to hold off on any further imaging until GI makes recommendations.  We will start on antibiotics. [MB]  1137 To Buccini from [facility] who thinks she will probably need an MRI RCP if no contraindications. [MB]  1141 Discussed with Dr. Daphine Deutscher general surgery who said they would see her when she gets on campus. [MB]    Clinical Course User Index [MB] Terrilee Files, MD   MDM Rules/Calculators/A&P                         This patient complains of right upper quadrant abdominal pain nausea  vomiting diarrhea fever; this involves an extensive number of treatment Options and is a complaint that carries with it a high risk of complications and Morbidity. The differential includes cholelithiasis, cholecystitis, cholangitis, choledocholithiasis, peptic ulcer disease, a GE  I ordered, reviewed and interpreted labs, which included CBC with elevated white count, normal hemoglobin, chemistries fairly normal, LFTs elevated, lipase elevated, COVID test negative, urinalysis positive for nitrites 11-20 whites I ordered medication IV fluids and pain medication nausea medication, IV antibiotics I ordered imaging studies which included right upper quadrant ultrasound and I independently    visualized and interpreted imaging which showed gallbladder sludge and sonographic Murphy's possible  cholecystitis Previous records obtained and reviewed in epic including prior PCP note I consulted Dr. Vivien RossettiBuccini [facility], Dr. Daphine DeutscherMartin general surgery, Dr. Gaynelle AduSegall [facility] and discussed lab and imaging findings  Critical Interventions: None  After the interventions stated above, I reevaluated the patient and found patient still to be symptomatic although improved.  She is agreeable to admission for further work-up of her condition.   Final Clinical Impression(s) / ED Diagnoses Final diagnoses:  RUQ abdominal pain  Elevated LFTs  Acute biliary pancreatitis, unspecified complication status    Rx / DC Orders ED Discharge Orders    None       Terrilee FilesButler, Bethany Hirt C, MD 07/14/20 1819

## 2020-07-14 NOTE — ED Triage Notes (Signed)
Intermittent Emesis x  2 weeks ago . Right flank pain radiating to lower abdomen.

## 2020-07-14 NOTE — Consult Note (Signed)
Referring Provider: [facility] Primary Care Physician:  Bradd Canary, MD Primary Gastroenterologist:  [facility] (prior patient of Dr. Laural Benes)  Reason for Consultation:  Abnormal LFTs, cholecystitis, pancreatitis  HPI: Alyssa Williamson is a 87 y.o. female with past medical history of A fib (on Eliquis), diastolic CHF, HTN, hysterectomy, and recurrent UTIs presenting for consultation of abnormal LFts, pancreatitis, and cholecystitis.  Patient was transferred from [facility] for suspected cholecystitis and pancreatitis.  Ultrasound today revealed sludge in the gallbladder with mild pericholecystic fluid and positive Murphy sign.  Findings suspicious for low-level cholecystitis or chronic cholecystitis.  CBD 7 mm.  Patient states she initially started having epigastric and right upper quadrant abdominal pain 2 weeks ago.  It was associated with nausea and vomiting.  Pain improved but recurred last evening, at which point she presented to outside ED.  She reports nausea and vomiting today as well and states she has not felt like eating or drinking anything.  She also reports epigastric pain radiating to her back.  Denies associated fever/chills.  She had diarrhea 2 weeks ago but states she has not had any loose stools since that time.  Denies melena or hematochezia.  Family history pertinent for mother and 2 daughters requiring cholecystectomies.  No family history of gastrointestinal malignancies.  She is on Eliquis, last dose yesterday 5/22 in the evening.  No aspirin or other NSAID use.  Denies regular alcohol use.  Has a glass of wine "here and there."  Past Medical History:  Diagnosis Date  . Allergic rhinitis   . Anemia   . Anxiety   . Arthritis    bilateral knee  . Atrial fibrillation (HCC) 12/21/2016  . Cervical cancer screening 10/30/2013   Menarche at 15 Regular and moderate flow No history of abnormal pap in past G4P3, s/p 3 svd and 1 MC No history of abnormal MGM Noconcerns today No  gyn surgeries  . Constipation 11/18/2015  . Depression with anxiety   . Diverticulitis   . Heart murmur   . Hyperlipidemia   . Hypertension   . Hypothyroid   . Left knee DJD   . Medicare annual wellness visit, subsequent 10/30/2013  . PONV (postoperative nausea and vomiting)   . Preventative health care 05/20/2016    Past Surgical History:  Procedure Laterality Date  . ABDOMINAL HYSTERECTOMY    . bladder tack  04/2010   Dr Marciano Sequin  . GANGLION CYST EXCISION    . JOINT REPLACEMENT Right   . REPLACEMENT TOTAL KNEE  10/2010   right knee-Murphy   . TONSILLECTOMY    . TOTAL KNEE ARTHROPLASTY Left 07/26/2012   Procedure: TOTAL KNEE ARTHROPLASTY;  Surgeon: Loreta Ave, MD;  Location: [facility] OR;  Service: Orthopedics;  Laterality: Left;    Prior to Admission medications   Medication Sig Start Date End Date Taking? Authorizing Provider  ALPRAZolam (XANAX) 0.25 MG tablet Take 1 tablet (0.25 mg total) by mouth daily as needed for anxiety. 06/05/20   Bradd Canary, MD  atorvastatin (LIPITOR) 40 MG tablet Take 1 tablet (40 mg total) by mouth daily. 02/25/20   Bradd Canary, MD  budesonide-formoterol [facility]) 160-4.5 MCG/ACT inhaler Inhale 2 puffs into the lungs 2 (two) times daily. 04/25/20   Saguier, Ramon Dredge, PA-C  citalopram (CELEXA) 20 MG tablet Take 1 tablet (20 mg total) by mouth daily. 03/25/20   Bradd Canary, MD  COVID-19 mRNA Vac-TriS, Pfizer, (PFIZER-BIONT COVID-19 VAC-TRIS) SUSP injection Inject into the  muscle. 06/26/20   Judyann MunsonSnider, Cynthia, MD  Cranberry 500 MG CAPS Take 500 mg by mouth daily.    [provider]  ELIQUIS 5 MG TABS tablet Take 1 tablet by mouth twice daily 05/26/20   Lewayne Buntingrenshaw, Brian S, MD  EUTHYROX 75 MCG tablet TAKE 1 TABLET BY MOUTH ONCE DAILY BEFORE BREAKFAST 05/05/20   Bradd CanaryBlyth, Stacey A, MD  furosemide (LASIX) 20 MG tablet Take 1 tablet (20 mg total) by mouth daily. 01/29/20   Bradd CanaryBlyth, Stacey A, MD  levocetirizine (XYZAL) 5 MG tablet Take 1 tablet (5 mg total)  by mouth every evening. 04/25/20   Saguier, Ramon DredgeEdward, PA-C  loratadine (CLARITIN) 10 MG tablet Take 10 mg by mouth daily.    [provider]  Multiple Vitamins-Minerals (CENTRUM SILVER PO) Take 1 tablet by mouth daily.    [provider]  verapamil (CALAN-SR) 240 MG CR tablet Take 1 tablet (240 mg total) by mouth daily. 03/25/20   Bradd CanaryBlyth, Stacey A, MD    Scheduled Meds: Continuous Infusions: PRN Meds:.  Allergies as of 07/14/2020 - Review Complete 07/14/2020  Allergen Reaction Noted  . Cefdinir Dermatitis 01/12/2018  . Codeine Nausea And Vomiting 05/31/2011    Family History  Problem Relation Age of Onset  . Alcohol abuse Father   . Breast cancer Sister   . Cancer Sister        bladder  . Glaucoma Sister   . Arthritis Daughter   . Diabetes Daughter        type 2  . Cancer Daughter        brain cancer, diagnosed 29 years.agp  . Mental illness Daughter        s/p craniotomies, gamma knife for tumors. numerous shunts.  . Heart failure Mother   . Cancer Brother        glioblastoma   . Glaucoma Brother   . Diverticulosis Sister   . Cancer Sister        liver  . Cancer Brother        melanoma, mets to lung and brain and intestine  . Alcohol abuse Brother   . Cancer Brother        liver  . Glaucoma Maternal Grandfather   . Cancer Sister        breast  . Colon cancer Neg Hx   . Hypertension Neg Hx     Social History   Socioeconomic History  . Marital status: Widowed    Spouse name: Not on file  . Number of children: 3  . Years of education: Not on file  . Highest education level: Not on file  Occupational History  . Not on file  Tobacco Use  . Smoking status: Never Smoker  . Smokeless tobacco: Never Used  Vaping Use  . Vaping Use: Never used  Substance and Sexual Activity  . Alcohol use: No  . Drug use: No  . Sexual activity: Not on file  Other Topics Concern  . Not on file  Social History Narrative  . Not on file   Social Determinants of  Health   Financial Resource Strain: Not on file  Food Insecurity: Not on file  Transportation Needs: Not on file  Physical Activity: Not on file  Stress: Not on file  Social Connections: Not on file  Intimate Partner Violence: Not on file    Review of Systems: Review of Systems  Constitutional: Negative for chills and fever.  HENT: Negative for sinus pain and sore throat.  Respiratory: Negative for cough and shortness of breath.   Cardiovascular: Negative for chest pain and palpitations.  Gastrointestinal: Positive for abdominal pain, nausea and vomiting. Negative for blood in stool, constipation, diarrhea, heartburn and melena.  Genitourinary: Negative for flank pain and hematuria.  Musculoskeletal: Positive for back pain. Negative for neck pain.  Skin: Negative for itching and rash.  Neurological: Negative for seizures and loss of consciousness.  Endo/Heme/Allergies: Negative for polydipsia. Does not bruise/bleed easily.  Psychiatric/Behavioral: Negative for substance abuse. The patient is not nervous/anxious.      Physical Exam: Vital signs: Vitals:   07/14/20 1300 07/14/20 1450  BP: 128/63 (!) 138/58  Pulse: 80 76  Resp: (!) 22 20  Temp:  98.2 F (36.8 C)  SpO2: 96% 100%     Physical Exam Vitals reviewed.  Constitutional:      General: She is not in acute distress. HENT:     Head: Normocephalic and atraumatic.     Nose: Nose normal. No congestion.     Mouth/Throat:     Mouth: Mucous membranes are moist.     Pharynx: Oropharynx is clear.  Eyes:     General: No scleral icterus.    Extraocular Movements: Extraocular movements intact.     Conjunctiva/sclera: Conjunctivae normal.  Cardiovascular:     Rate and Rhythm: Normal rate and regular rhythm.     Heart sounds: Normal heart sounds.  Pulmonary:     Effort: Pulmonary effort is normal. No respiratory distress.  Abdominal:     General: Bowel sounds are normal. There is no distension.     Palpations: Abdomen  is soft. There is no mass.     Tenderness: There is abdominal tenderness (mild RUQ, epigastric). There is no guarding or rebound.     Hernia: No hernia is present.  Musculoskeletal:        General: No tenderness.     Cervical back: Normal range of motion and neck supple.     Right lower leg: Edema (trace) present.     Left lower leg: Edema (trace) present.  Skin:    General: Skin is warm and dry.  Neurological:     General: No focal deficit present.     Mental Status: She is alert and oriented to person, place, and time.  Psychiatric:        Mood and Affect: Mood normal.        Behavior: Behavior normal. Behavior is cooperative.      GI:  Lab Results: Recent Labs    07/14/20 0837  WBC 12.9*  HGB 13.6  HCT 39.4  PLT 201   BMET Recent Labs    07/14/20 0837  NA 136  K 3.7  CL 100  CO2 26  GLUCOSE 108*  BUN 17  CREATININE 0.89  CALCIUM 8.4*   LFT Recent Labs    07/14/20 0837  PROT 6.7  ALBUMIN 3.2*  AST 636*  ALT 383*  ALKPHOS 119  BILITOT 1.9*   PT/INR No results for input(s): LABPROT, INR in the last 72 hours.   Studies/Results: US Abdomen Limited RUQ (LIVER/GB)  Result Date: 07/14/2020 CLINICAL DATA:  Right upper quadrant pain, nausea, and vomiting. EXAM: ULTRASOUND ABDOMEN LIMITED RIGHT UPPER QUADRANT COMPARISON:  Ultrasound abdomen 10/22/2011 FINDINGS: Gallbladder: Sludge noted within the gallbladder lumen. Mild pericholecystic fluid. No gallbladder wall thickening sonographic Eulah Pont sign is positive per technologist. Common bile duct: Diameter: 7 mm Liver: No focal lesion identified. Within normal limits in parenchymal echogenicity. Portal vein is  patent on color Doppler imaging with normal direction of blood flow towards the liver. Other: None. IMPRESSION: Minimal pericholecystic fluid. Gallbladder sludge. Sonographic Eulah Pont sign is positive per technologist. Findings are suspicious for low-level cholecystitis or chronic cholecystitis. Further  evaluation with HIDA scan would be beneficial. Electronically Signed   By: Acquanetta Belling M.D.   On: 07/14/2020 10:45    Impression: Acute pancreatitis, concern for gallstone pancreatitis given abnormal LFTs and suspected cholecystitis.  No significant alcohol use.  Normal lipid panel as of 06/26/20. -Lipase 543 -T bili 1.9/AST 636/ALT 383/ALP 119 -Mild leukocytosis, WBC is 12.9  A. fib, on Eliquis, last dose 5/22 PM  Diastolic CHF: EF 60 to 65% as of echo 06/2019  Plan: Start IV fluids at rate of 150 cc/h.  Decrease if needed due to CHF.  MRI/MRCP for further evaluation.  If positive, recommend proceeding with ERCP.  I thoroughly discussed ERCP with the patient, as well as patient's daughter at bedside, to include nature, alternatives, benefits, and risks (including but not limited to pancreatitis, bleeding, infection, perforation, and anesthesia/cardiac and pulmonary complications).  Patient and patient's daughter verbalized understanding gave verbal consent to proceed with ERCP if MRI/MRCP is positive for choledocholithiasis.  Continue to trend LFTs.  Continue supportive care.  Continue empiric antibiotics.  Continue to hold Eliquis.  After MRI/MRCP, clear liquid diet OK.  [facility] will follow.   LOS: 0 days   Edrick Kins  PA-C 07/14/2020, 3:01 PM  Contact #  [phone]

## 2020-07-14 NOTE — ED Notes (Addendum)
Pt pain 3-4/10.  Requesting additional pain medication before being transported to [facility].  Dr. Charm Barges notified and provided orders for Fentanyl 50 mcg iv.

## 2020-07-14 NOTE — ED Notes (Signed)
Telephone report called to [facility].

## 2020-07-14 NOTE — H&P (Signed)
History and Physical        Hospital Admission Note Date: 07/14/2020  Patient name: Alyssa Williamson Medical record number: [id number] Date of birth: 06-19-1931 Age: 87 y.o. Gender: female  PCP: [name], MD   Chief Complaint    Chief Complaint  Patient presents with  . Emesis      HPI:   This is an 87 year old female with past medical history of atrial fibrillation on Eliquis, hypertension, hyperlipidemia, hypothyroidism, chronic diastolic CHF who presented to [facility] with complaints of nausea, vomiting and right-sided abdominal pain with associated diarrhea for almost 2 weeks.  Associated low-grade fever.  She was treated with Bactrim for a UTI on 4/18.  Saw her PCP on 5/17 when the patient reported her GI symptoms and her PCP initially felt patient had a norovirus infection as her symptoms initially resolved.  Unfortunately her symptoms of right flank pain radiating to her lower abdomen with emesis and low-grade fevers recurred, prompting her to go to the ED today.  Currently feeling better overall denies chest pain, shortness of breath or any other significant complaints.  Currently, she is on oxygen and states that she does not wear O2 at baseline.   ED Course: Afebrile, hemodynamically stable, on room air. Notable Labs: Sodium 136, K3.7, BUN 17, creatinine 0.89, alk phos 119, lipase 543, AST 636, ALT 383, T bili 1.9, WBC 12.9, Hb 13.6, platelets 201, UA positive for infection, COVID-19 and flu negative. Notable Imaging: RUQ [place]- minimal pericholecystic fluid, gallbladder sludge, sonographic Murphy sign positive suspicious for low-level cholecystitis or chronic cholecystitis. Patient received fentanyl, Zofran, Cipro, Flagyl, 500 cc NS bolus.  ED provider discussed with Dr. Cristina Gong, GI, who requested MRI/MRCP and Dr. Hassell Done from general surgery was consulted.  Patient was transferred  to [facility] for further work-up    Vitals:   07/14/20 1300 07/14/20 1450  BP: 128/63 (!) 138/58  Pulse: 80 76  Resp: (!) 22 20  Temp:  98.2 F (36.8 C)  SpO2: 96% 100%     Review of Systems:  Review of Systems  All other systems reviewed and are negative.   Medical/Social/Family History   Past Medical History: Past Medical History:  Diagnosis Date  . Allergic rhinitis   . Anemia   . Anxiety   . Arthritis    bilateral knee  . Atrial fibrillation ([place]) 12/21/2016  . Cervical cancer screening 10/30/2013   Menarche at 15 Regular and moderate flow No history of abnormal pap in past G4P3, s/p 3 svd and 1 MC No history of abnormal MGM Noconcerns today No gyn surgeries  . Constipation 11/18/2015  . Depression with anxiety   . Diverticulitis   . Heart murmur   . Hyperlipidemia   . Hypertension   . Hypothyroid   . Left knee DJD   . Medicare annual wellness visit, subsequent 10/30/2013  . PONV (postoperative nausea and vomiting)   . Preventative health care 05/20/2016    Past Surgical History:  Procedure Laterality Date  . ABDOMINAL HYSTERECTOMY    . bladder tack  04/2010   Dr Dinah Beers  . GANGLION CYST EXCISION    . JOINT REPLACEMENT Right   . REPLACEMENT TOTAL KNEE  10/2010   right knee-Murphy   . TONSILLECTOMY    . TOTAL KNEE ARTHROPLASTY Left 07/26/2012   Procedure: TOTAL KNEE ARTHROPLASTY;  Surgeon: Ninetta Lights, MD;  Location: [place];  Service: Orthopedics;  Laterality: Left;    Medications: Prior to Admission medications   Medication Sig Start Date End Date Taking? Authorizing Provider  ALPRAZolam (XANAX) 0.25 MG tablet Take 1 tablet (0.25 mg total) by mouth daily as needed for anxiety. 06/05/20   [name], MD  atorvastatin (LIPITOR) 40 MG tablet Take 1 tablet (40 mg total) by mouth daily. 02/25/20   [name], MD  budesonide-formoterol [facility]) 160-4.5 MCG/ACT inhaler Inhale 2 puffs into the lungs 2 (two) times daily. 04/25/20   Saguier, Percell Miller, PA-C   citalopram (CELEXA) 20 MG tablet Take 1 tablet (20 mg total) by mouth daily. 03/25/20   [name], MD  COVID-19 mRNA Vac-TriS, Pfizer, (PFIZER-BIONT COVID-19 VAC-TRIS) SUSP injection Inject into the muscle. 06/26/20   Carlyle Basques, MD  Cranberry 500 MG CAPS Take 500 mg by mouth daily.    [provider]  ELIQUIS 5 MG TABS tablet Take 1 tablet by mouth twice daily 05/26/20   Lelon Perla, MD  EUTHYROX 75 MCG tablet TAKE 1 TABLET BY MOUTH ONCE DAILY BEFORE BREAKFAST 05/05/20   [name], MD  furosemide (LASIX) 20 MG tablet Take 1 tablet (20 mg total) by mouth daily. 01/29/20   [name], MD  levocetirizine (XYZAL) 5 MG tablet Take 1 tablet (5 mg total) by mouth every evening. 04/25/20   Saguier, Percell Miller, PA-C  loratadine (CLARITIN) 10 MG tablet Take 10 mg by mouth daily.    [provider]  Multiple Vitamins-Minerals (CENTRUM SILVER PO) Take 1 tablet by mouth daily.    [provider]  verapamil (CALAN-SR) 240 MG CR tablet Take 1 tablet (240 mg total) by mouth daily. 03/25/20   [name], MD    Allergies:   Allergies  Allergen Reactions  . Cefdinir Dermatitis  . Codeine Nausea And Vomiting    Social History:  reports that she has never smoked. She has never used smokeless tobacco. She reports that she does not drink alcohol and does not use drugs.  Family History: Family History  Problem Relation Age of Onset  . Alcohol abuse Father   . Breast cancer Sister   . Cancer Sister        bladder  . Glaucoma Sister   . Arthritis Daughter   . Diabetes Daughter        type 2  . Cancer Daughter        brain cancer, diagnosed 63 years.agp  . Mental illness Daughter        s/p craniotomies, gamma knife for tumors. numerous shunts.  . Heart failure Mother   . Cancer Brother        glioblastoma   . Glaucoma Brother   . Diverticulosis Sister   . Cancer Sister        liver  . Cancer Brother        melanoma, mets to lung and brain and  intestine  . Alcohol abuse Brother   . Cancer Brother        liver  . Glaucoma Maternal Grandfather   . Cancer Sister        breast  . Colon cancer Neg Hx   . Hypertension Neg Hx      Objective   Physical Exam: Blood pressure Marland Kitchen)  138/58, pulse 76, temperature 98.2 F (36.8 C), temperature source Oral, resp. rate 20, SpO2 100 %.  Physical Exam Vitals and nursing note reviewed.  Constitutional:      Appearance: Normal appearance.  HENT:     Head: Normocephalic and atraumatic.  Eyes:     Conjunctiva/sclera: Conjunctivae normal.  Cardiovascular:     Rate and Rhythm: Normal rate and regular rhythm.  Pulmonary:     Effort: Pulmonary effort is normal.     Breath sounds: Examination of the right-lower field reveals rales. Examination of the left-lower field reveals rales. Rales present.  Abdominal:     General: Abdomen is flat.     Palpations: Abdomen is soft.     Tenderness: There is abdominal tenderness in the right upper quadrant.  Musculoskeletal:        General: No tenderness.     Comments: 3 + bilateral lower extremity pitting edema  Skin:    Coloration: Skin is not jaundiced or pale.  Neurological:     Mental Status: She is alert. Mental status is at baseline.  Psychiatric:        Mood and Affect: Mood normal.        Behavior: Behavior normal.     LABS on Admission: I have personally reviewed all the labs and imaging below    Basic Metabolic Panel: Recent Labs  Lab 07/08/20 0957 07/14/20 0837  NA 135 136  K 3.5 3.7  CL 97 100  CO2 31 26  GLUCOSE 93 108*  BUN 11 17  CREATININE 0.94 0.89  CALCIUM 8.9 8.4*   Liver Function Tests: Recent Labs  Lab 07/08/20 0957 07/14/20 0837  AST 24 636*  ALT 20 383*  ALKPHOS 68 119  BILITOT 1.0 1.9*  PROT 5.9* 6.7  ALBUMIN 3.4* 3.2*   Recent Labs  Lab 07/14/20 0837  LIPASE 543*   No results for input(s): AMMONIA in the last 168 hours. CBC: Recent Labs  Lab 07/08/20 0957 07/14/20 0837  WBC 4.8 12.9*   NEUTROABS 3.0  --   HGB 13.4 13.6  HCT 38.8 39.4  MCV 95.5 95.4  PLT 174.0 201   Cardiac Enzymes: No results for input(s): CKTOTAL, CKMB, CKMBINDEX, TROPONINI in the last 168 hours. BNP: Invalid input(s): POCBNP CBG: No results for input(s): GLUCAP in the last 168 hours.  Radiological Exams on Admission:  US Abdomen Limited RUQ (LIVER/GB)  Result Date: 07/14/2020 CLINICAL DATA:  Right upper quadrant pain, nausea, and vomiting. EXAM: ULTRASOUND ABDOMEN LIMITED RIGHT UPPER QUADRANT COMPARISON:  Ultrasound abdomen 10/22/2011 FINDINGS: Gallbladder: Sludge noted within the gallbladder lumen. Mild pericholecystic fluid. No gallbladder wall thickening sonographic Percell Miller sign is positive per technologist. Common bile duct: Diameter: 7 mm Liver: No focal lesion identified. Within normal limits in parenchymal echogenicity. Portal vein is patent on color Doppler imaging with normal direction of blood flow towards the liver. Other: None. IMPRESSION: Minimal pericholecystic fluid. Gallbladder sludge. Sonographic Percell Miller sign is positive per technologist. Findings are suspicious for low-level cholecystitis or chronic cholecystitis. Further evaluation with HIDA scan would be beneficial. Electronically Signed   By: Miachel Roux M.D.   On: 07/14/2020 10:45      EKG: Not done   A & P   Principal Problem:   Cholecystitis Active Problems:   Hypothyroidism   HTN (hypertension)   Hyperlipidemia   Recurrent UTI   Atrial fibrillation (HCC)   Acute on chronic diastolic CHF (congestive heart failure) ([place])   1. Acute pancreatitis with concern for gallstone  pancreatitis a. Trend lipase and LFTs b. General surgery and GI consulted, plan for MRI/MRCP and if positive then will proceed with ERCP.  General surgery plans on cholecystectomy this admission once pancreatitis resolves c. Per general surgery, hold Eliquis, okay for DVT prophylaxis d. Continue Cipro/Flagyl e. Will discontinue IV fluids given  suspected diastolic CHF exacerbation f. Clear liquid diet  2. Suspected acute on chronic diastolic CHF, likely from IV fluids a. Rales on exam with 3+ bilateral lower extremity pitting edema b. Will discontinue IV fluids and give Lasix 40 mg IV twice daily for 2 doses  c. will check a BNP and EKG and place on telemetry d. Trend daily weights and intake/output  3. Atrial fibrillation, rate controlled a. Continue home verapamil b. Holding Eliquis as above  4. Hypertension a. Continue verapamil  5. Hyperlipidemia a. Holding statin due to elevated LFTs  6. Hypothyroidism a. Continue levothyroxine  7. Asymptomatic UTI a. Getting antibiotics as above  8. Anxiety/depression a. Continue as needed Xanax b. Hold Celexa to decrease pill burden for now   DVT prophylaxis: Heparin   Code Status: DNR  Diet: Heparin clear liquid Family Communication: Admission, patients condition and plan of care including tests being ordered have been discussed with the patient who indicates understanding and agrees with the plan and Code Status. Patient's daughter was updated  Disposition Plan: The appropriate patient status for this patient is INPATIENT. Inpatient status is judged to be reasonable and necessary in order to provide the required intensity of service to ensure the patient's safety. The patient's presenting symptoms, physical exam findings, and initial radiographic and laboratory data in the context of their chronic comorbidities is felt to place them at high risk for further clinical deterioration. Furthermore, it is not anticipated that the patient will be medically stable for discharge from the hospital within 2 midnights of admission. The following factors support the patient status of inpatient.   " The patient's presenting symptoms include abdominal pain, nausea and vomiting. " The worrisome physical exam findings include RUQ pain and rales and edema " The initial radiographic and  laboratory data are worrisome because of elevated lipase and LFTs. " The chronic co-morbidities include A. fib and diastolic CHF.   * I certify that at the point of admission it is my clinical judgment that the patient will require inpatient hospital care spanning beyond 2 midnights from the point of admission due to high intensity of service, high risk for further deterioration and high frequency of surveillance required.*     The medical decision making on this patient was of high complexity and the patient is at high risk for clinical deterioration, therefore this is a level 3 admission.  Consultants  . GI . General surgery  Procedures  . None  Time Spent on Admission: 70 minutes    Harold Hedge, DO [facility]  07/14/2020, 5:18 PM

## 2020-07-14 NOTE — Consult Note (Signed)
[facility] Surgery Consult Note  BLYSS LUGAR [birth year]  [id number].    Requesting MD: Aletta Edouard Chief Complaint/Reason for Consult: elevated LFTs  HPI:  Alyssa Williamson is an 87yo female PMH PAF on eliquis (last dose 5/22 in PM), HTN, HLD, diastolic CHF, and recurrent UTIs who was transferred from [facility] to [facility] for admission with chief complaint worsening abdominal pain, nausea, and vomiting. Symptoms started about 2 weeks ago and have progressively gotten worse. Initially her symptoms waxed and waned, then around 0300 today she woke up with severe abdominal pain. Pain is mostly in her upper abdomen, left worse than right. It is constant and radiates into her back. No known fevers at home. States that she has never had pain like this before. ED work up revealed WBC 12.9, elevated LFTs with AST 636, ALT 383, Alk phos 119, Tbili 1.9. Lipase 543. Abdominal u/s showed minimal pericholecystic fluid, gallbladder sludge, and positive sonographic Murphy sign suspicious for low-level cholecystitis or chronic cholecystitis. Patient was admitted to the medical service. General surgery asked to see.  Abdominal surgical history: hysterectomy Nonsmoker Drinks wine very occasionally Denies illicit drug use Lives at home independently Ambulates without assistive device  Review of Systems  Constitutional: Negative.   Respiratory: Positive for wheezing. Negative for shortness of breath.   Cardiovascular: Negative.   Gastrointestinal: Positive for abdominal pain, nausea and vomiting.   All systems reviewed and otherwise negative except for as above  Family History  Problem Relation Age of Onset  . Alcohol abuse Father   . Breast cancer Sister   . Cancer Sister        bladder  . Glaucoma Sister   . Arthritis Daughter   . Diabetes Daughter        type 2  . Cancer Daughter        brain cancer, diagnosed 10 years.agp  . Mental illness Daughter        s/p craniotomies, gamma knife for  tumors. numerous shunts.  . Heart failure Mother   . Cancer Brother        glioblastoma   . Glaucoma Brother   . Diverticulosis Sister   . Cancer Sister        liver  . Cancer Brother        melanoma, mets to lung and brain and intestine  . Alcohol abuse Brother   . Cancer Brother        liver  . Glaucoma Maternal Grandfather   . Cancer Sister        breast  . Colon cancer Neg Hx   . Hypertension Neg Hx     Past Medical History:  Diagnosis Date  . Allergic rhinitis   . Anemia   . Anxiety   . Arthritis    bilateral knee  . Atrial fibrillation ([facility]) 12/21/2016  . Cervical cancer screening 10/30/2013   Menarche at 15 Regular and moderate flow No history of abnormal pap in past G4P3, s/p 3 svd and 1 MC No history of abnormal MGM Noconcerns today No gyn surgeries  . Constipation 11/18/2015  . Depression with anxiety   . Diverticulitis   . Heart murmur   . Hyperlipidemia   . Hypertension   . Hypothyroid   . Left knee DJD   . Medicare annual wellness visit, subsequent 10/30/2013  . PONV (postoperative nausea and vomiting)   . Preventative health care 05/20/2016    Past Surgical History:  Procedure Laterality Date  . ABDOMINAL HYSTERECTOMY    .  bladder tack  04/2010   Dr Dinah Beers  . GANGLION CYST EXCISION    . JOINT REPLACEMENT Right   . REPLACEMENT TOTAL KNEE  10/2010   right knee-Murphy   . TONSILLECTOMY    . TOTAL KNEE ARTHROPLASTY Left 07/26/2012   Procedure: TOTAL KNEE ARTHROPLASTY;  Surgeon: Ninetta Lights, MD;  Location: [place];  Service: Orthopedics;  Laterality: Left;    Social History:  reports that she has never smoked. She has never used smokeless tobacco. She reports that she does not drink alcohol and does not use drugs.  Allergies:  Allergies  Allergen Reactions  . Cefdinir Dermatitis  . Codeine Nausea And Vomiting    (Not in a hospital admission)   Prior to Admission medications   Medication Sig Start Date End Date Taking? Authorizing  Provider  ALPRAZolam (XANAX) 0.25 MG tablet Take 1 tablet (0.25 mg total) by mouth daily as needed for anxiety. 06/05/20   [name], MD  atorvastatin (LIPITOR) 40 MG tablet Take 1 tablet (40 mg total) by mouth daily. 02/25/20   [name], MD  budesonide-formoterol [facility]) 160-4.5 MCG/ACT inhaler Inhale 2 puffs into the lungs 2 (two) times daily. 04/25/20   Saguier, Percell Miller, PA-C  citalopram (CELEXA) 20 MG tablet Take 1 tablet (20 mg total) by mouth daily. 03/25/20   [name], MD  COVID-19 mRNA Vac-TriS, Pfizer, (PFIZER-BIONT COVID-19 VAC-TRIS) SUSP injection Inject into the muscle. 06/26/20   Carlyle Basques, MD  Cranberry 500 MG CAPS Take 500 mg by mouth daily.    [provider]  ELIQUIS 5 MG TABS tablet Take 1 tablet by mouth twice daily 05/26/20   Lelon Perla, MD  EUTHYROX 75 MCG tablet TAKE 1 TABLET BY MOUTH ONCE DAILY BEFORE BREAKFAST 05/05/20   [name], MD  furosemide (LASIX) 20 MG tablet Take 1 tablet (20 mg total) by mouth daily. 01/29/20   [name], MD  levocetirizine (XYZAL) 5 MG tablet Take 1 tablet (5 mg total) by mouth every evening. 04/25/20   Saguier, Percell Miller, PA-C  loratadine (CLARITIN) 10 MG tablet Take 10 mg by mouth daily.    [provider]  Multiple Vitamins-Minerals (CENTRUM SILVER PO) Take 1 tablet by mouth daily.    [provider]  verapamil (CALAN-SR) 240 MG CR tablet Take 1 tablet (240 mg total) by mouth daily. 03/25/20   [name], MD    Blood pressure 128/63, pulse 80, temperature 98.6 F (37 C), temperature source Oral, resp. rate (!) 22, SpO2 96 %. Physical Exam: General: pleasant, WD/WN female who is laying in bed in NAD HEENT: head is normocephalic, atraumatic.  Sclera are noninjected.  Pupils equal and round.  Ears and nose without any masses or lesions.  Mouth is pink and moist. Dentition fair Heart: regular, rate, and rhythm.  Normal s1,s2. No obvious murmurs, gallops, or rubs noted.  Palpable pedal  pulses bilaterally  Lungs: CTAB, no wheezes, rhonchi, or rales noted.  Respiratory effort nonlabored Abd: soft, ND, mild subjective upper abdominal TTP, +BS, no masses, hernias, or organomegaly MS: trace BLE edema, calves soft and nontender Skin: warm and dry with no masses, lesions, or rashes Psych: A&Ox4 with an appropriate affect Neuro: cranial nerves grossly intact, equal strength in BUE/BLE bilaterally, normal speech, thought process intact  Results for orders placed or performed during the hospital encounter of 07/14/20 (from the past 48 hour(s))  Lipase, blood     Status: Abnormal   Collection  Time: 07/14/20  8:37 AM  Result Value Ref Range   Lipase 543 (H) 11 - 51 U/L    Comment: RESULTS CONFIRMED BY MANUAL DILUTION Performed at [facility], [address] [place], [place] [address]   Comprehensive metabolic panel     Status: Abnormal   Collection Time: 07/14/20  8:37 AM  Result Value Ref Range   Sodium 136 135 - 145 mmol/L   Potassium 3.7 3.5 - 5.1 mmol/L   Chloride 100 98 - 111 mmol/L   CO2 26 22 - 32 mmol/L   Glucose, Bld 108 (H) 70 - 99 mg/dL    Comment: Glucose reference range applies only to samples taken after fasting for at least 8 hours.   BUN 17 8 - 23 mg/dL   Creatinine, Ser 0.89 0.44 - 1.00 mg/dL   Calcium 8.4 (L) 8.9 - 10.3 mg/dL   Total Protein 6.7 6.5 - 8.1 g/dL   Albumin 3.2 (L) 3.5 - 5.0 g/dL   AST 636 (H) 15 - 41 U/L   ALT 383 (H) 0 - 44 U/L   Alkaline Phosphatase 119 38 - 126 U/L   Total Bilirubin 1.9 (H) 0.3 - 1.2 mg/dL   GFR, Estimated >60 >60 mL/min    Comment: (NOTE) Calculated using the CKD-EPI Creatinine Equation (2021)    Anion gap 10 5 - 15    Comment: Performed at [facility], [address] [place], [place] [address]  CBC     Status: Abnormal   Collection Time: 07/14/20  8:37 AM  Result Value Ref Range   WBC 12.9 (H) 4.0 - 10.5 K/uL   RBC 4.13 3.87 - 5.11 MIL/uL   Hemoglobin 13.6 12.0 - 15.0 g/dL   HCT 39.4  36.0 - 46.0 %   MCV 95.4 80.0 - 100.0 fL   MCH 32.9 26.0 - 34.0 pg   MCHC 34.5 30.0 - 36.0 g/dL   RDW 13.2 11.5 - 15.5 %   Platelets 201 150 - 400 K/uL   nRBC 0.0 0.0 - 0.2 %    Comment: Performed at [facility], [address] [place], [place] [address]  Resp Panel by RT-PCR (Flu A&B, Covid) Nasopharyngeal Swab     Status: None   Collection Time: 07/14/20 10:39 AM   Specimen: Nasopharyngeal Swab; Nasopharyngeal(NP) swabs in vial transport medium  Result Value Ref Range   SARS Coronavirus 2 by RT PCR NEGATIVE NEGATIVE    Comment: (NOTE) SARS-CoV-2 target nucleic acids are NOT DETECTED.  The SARS-CoV-2 RNA is generally detectable in upper respiratory specimens during the acute phase of infection. The lowest concentration of SARS-CoV-2 viral copies this assay can detect is 138 copies/mL. A negative result does not preclude SARS-Cov-2 infection and should not be used as the sole basis for treatment or other patient management decisions. A negative result may occur with  improper specimen collection/handling, submission of specimen other than nasopharyngeal swab, presence of viral mutation(s) within the areas targeted by this assay, and inadequate number of viral copies(<138 copies/mL). A negative result must be combined with clinical observations, patient history, and epidemiological information. The expected result is Negative.  Fact Sheet for Patients:  EntrepreneurPulse.com.au  Fact Sheet for Healthcare Providers:  IncredibleEmployment.be  This test is no t yet approved or cleared by the [place] FDA and  has been authorized for detection and/or diagnosis of SARS-CoV-2 by FDA under an Emergency Use Authorization (EUA). This EUA will remain  in  effect (meaning this test can be used) for the duration of the COVID-19 declaration under Section 564(b)(1) of the Act, 21 U.S.C.section 360bbb-3(b)(1), unless the authorization is  terminated  or revoked sooner.       Influenza A by PCR NEGATIVE NEGATIVE   Influenza B by PCR NEGATIVE NEGATIVE    Comment: (NOTE) The Xpert Xpress SARS-CoV-2/FLU/RSV plus assay is intended as an aid in the diagnosis of influenza from Nasopharyngeal swab specimens and should not be used as a sole basis for treatment. Nasal washings and aspirates are unacceptable for Xpert Xpress SARS-CoV-2/FLU/RSV testing.  Fact Sheet for Patients: EntrepreneurPulse.com.au  Fact Sheet for Healthcare Providers: IncredibleEmployment.be  This test is not yet approved or cleared by the [place] FDA and has been authorized for detection and/or diagnosis of SARS-CoV-2 by FDA under an Emergency Use Authorization (EUA). This EUA will remain in effect (meaning this test can be used) for the duration of the COVID-19 declaration under Section 564(b)(1) of the Act, 21 U.S.C. section 360bbb-3(b)(1), unless the authorization is terminated or revoked.  Performed at [facility], [address] [place], [place] [address]   Urinalysis, Routine w reflex microscopic Urine, Clean Catch     Status: Abnormal   Collection Time: 07/14/20  1:14 PM  Result Value Ref Range   Color, Urine YELLOW YELLOW   APPearance TURBID (A) CLEAR   Specific Gravity, Urine <1.005 (L) 1.005 - 1.030   pH 7.0 5.0 - 8.0   Glucose, UA NEGATIVE NEGATIVE mg/dL   Hgb urine dipstick TRACE (A) NEGATIVE   Bilirubin Urine NEGATIVE NEGATIVE   Ketones, ur NEGATIVE NEGATIVE mg/dL   Protein, ur NEGATIVE NEGATIVE mg/dL   Nitrite POSITIVE (A) NEGATIVE   Leukocytes,Ua LARGE (A) NEGATIVE    Comment: Performed at [facility], [address] [place], [place] [address]  Urinalysis, Microscopic (reflex)     Status: Abnormal   Collection Time: 07/14/20  1:14 PM  Result Value Ref Range   RBC / HPF 0-5 0 - 5 RBC/hpf   WBC, UA 11-20 0 - 5 WBC/hpf   Bacteria, UA MANY (A) NONE SEEN    Squamous Epithelial / LPF 0-5 0 - 5   Non Squamous Epithelial PRESENT (A) NONE SEEN   WBC Clumps PRESENT     Comment: Performed at [facility], [address] [place], [place] [address]   US Abdomen Limited RUQ (LIVER/GB)  Result Date: 07/14/2020 CLINICAL DATA:  Right upper quadrant pain, nausea, and vomiting. EXAM: ULTRASOUND ABDOMEN LIMITED RIGHT UPPER QUADRANT COMPARISON:  Ultrasound abdomen 10/22/2011 FINDINGS: Gallbladder: Sludge noted within the gallbladder lumen. Mild pericholecystic fluid. No gallbladder wall thickening sonographic Percell Miller sign is positive per technologist. Common bile duct: Diameter: 7 mm Liver: No focal lesion identified. Within normal limits in parenchymal echogenicity. Portal vein is patent on color Doppler imaging with normal direction of blood flow towards the liver. Other: None. IMPRESSION: Minimal pericholecystic fluid. Gallbladder sludge. Sonographic Percell Miller sign is positive per technologist. Findings are suspicious for low-level cholecystitis or chronic cholecystitis. Further evaluation with HIDA scan would be beneficial. Electronically Signed   By: Miachel Roux M.D.   On: 07/14/2020 10:45    Anti-infectives (From admission, onward)   Start     Dose/Rate Route Frequency Ordered Stop   07/14/20 1130  ciprofloxacin (CIPRO) IVPB 400 mg       "And" Linked Group Details   400 mg 200 mL/hr over 60 Minutes Intravenous  Once 07/14/20  1122 07/14/20 1312   07/14/20 1130  metroNIDAZOLE (FLAGYL) IVPB 500 mg       "And" Linked Group Details   500 mg 100 mL/hr over 60 Minutes Intravenous  Once 07/14/20 1122 07/14/20 1410       Assessment/Plan PAF on eliquis (last dose 5/22 in PM) HTN HLD Diastolic CHF - cardiologist Dr. Stanford Breed, ECHO 06/26/19 EF 60-65% Recurrent UTIs  Acute pancreatitis  Possible cholecystitis Elevated LFTs - Patient with acute pancreatitis, concern for gallstone pancreatitis given elevated LFTs. Continue management per primary with  bowel rest and IVF. MRCP pending to rule out choledocholithiasis. She also has findings consistent with acute cholecystitis on u/s so we will continue IV antibiotics. Patient will benefit from cholecystectomy this admission once pancreatitis resolves. Trend lipase and LFTs. Hold eliquis, this will need to be held x48 hours prior to any surgical intervention. Patient will also need medical/cardiac clearance prior to surgery. We will follow.   ID - cipro/flagyl 5/23>> VTE - ok for chemical DVT prophylaxis or IV heparin if needed, hold eliquis FEN - IVF< NPO Foley - none Follow up - TBD  Wellington Hampshire, [facility] Surgery 07/14/2020, 2:27 PM Please see Amion for pager number during day hours 7:00am-4:30pm

## 2020-07-15 ENCOUNTER — Inpatient Hospital Stay (HOSPITAL_COMMUNITY): Payer: Medicare Other

## 2020-07-15 DIAGNOSIS — I4891 Unspecified atrial fibrillation: Secondary | ICD-10-CM | POA: Diagnosis not present

## 2020-07-15 DIAGNOSIS — Z01810 Encounter for preprocedural cardiovascular examination: Secondary | ICD-10-CM | POA: Diagnosis not present

## 2020-07-15 DIAGNOSIS — I5032 Chronic diastolic (congestive) heart failure: Secondary | ICD-10-CM | POA: Diagnosis not present

## 2020-07-15 DIAGNOSIS — I5033 Acute on chronic diastolic (congestive) heart failure: Secondary | ICD-10-CM | POA: Diagnosis not present

## 2020-07-15 LAB — URINALYSIS, ROUTINE W REFLEX MICROSCOPIC
Bilirubin Urine: NEGATIVE
Glucose, UA: NEGATIVE mg/dL
Hgb urine dipstick: NEGATIVE
Ketones, ur: NEGATIVE mg/dL
Nitrite: NEGATIVE
Protein, ur: NEGATIVE mg/dL
Specific Gravity, Urine: 1.015 (ref 1.005–1.030)
WBC, UA: >50 WBC/hpf — ABNORMAL HIGH (ref 0–5)
pH: 5 (ref 5.0–8.0)

## 2020-07-15 LAB — ECHOCARDIOGRAM COMPLETE
AR max vel: 1.65 cm2
AV Area VTI: 1.93 cm2
AV Area mean vel: 1.8 cm2
AV Mean grad: 6 mmHg
AV Peak grad: 10.2 mmHg
Ao pk vel: 1.6 m/s
Area-P 1/2: 5.62 cm2
Calc EF: 44.6 %
MV M vel: 4.27 m/s
MV Peak grad: 72.9 mmHg
MV VTI: 1.8 cm2
S' Lateral: 2.6 cm
Single Plane A2C EF: 54.6 %
Single Plane A4C EF: 41.6 %
Weight: 3097.02 oz

## 2020-07-15 LAB — COMPREHENSIVE METABOLIC PANEL
ALT: 578 U/L — ABNORMAL HIGH (ref 0–44)
AST: 569 U/L — ABNORMAL HIGH (ref 15–41)
Albumin: 2.9 g/dL — ABNORMAL LOW (ref 3.5–5.0)
Alkaline Phosphatase: 133 U/L — ABNORMAL HIGH (ref 38–126)
Anion gap: 9 (ref 5–15)
BUN: 15 mg/dL (ref 8–23)
CO2: 25 mmol/L (ref 22–32)
Calcium: 8.2 mg/dL — ABNORMAL LOW (ref 8.9–10.3)
Chloride: 101 mmol/L (ref 98–111)
Creatinine, Ser: 1.37 mg/dL — ABNORMAL HIGH (ref 0.44–1.00)
GFR, Estimated: 37 mL/min — ABNORMAL LOW (ref >60)
Glucose, Bld: 91 mg/dL (ref 70–99)
Potassium: 4 mmol/L (ref 3.5–5.1)
Sodium: 135 mmol/L (ref 135–145)
Total Bilirubin: 6 mg/dL — ABNORMAL HIGH (ref 0.3–1.2)
Total Protein: 5.9 g/dL — ABNORMAL LOW (ref 6.5–8.1)

## 2020-07-15 LAB — OSMOLALITY, URINE: Osmolality, Ur: 350 mOsm/kg (ref 300–900)

## 2020-07-15 LAB — CBC
HCT: 37.9 % (ref 36.0–46.0)
Hemoglobin: 12.6 g/dL (ref 12.0–15.0)
MCH: 33.2 pg (ref 26.0–34.0)
MCHC: 33.2 g/dL (ref 30.0–36.0)
MCV: 99.7 fL (ref 80.0–100.0)
Platelets: 195 10*3/uL (ref 150–400)
RBC: 3.8 MIL/uL — ABNORMAL LOW (ref 3.87–5.11)
RDW: 14 % (ref 11.5–15.5)
WBC: 20.2 10*3/uL — ABNORMAL HIGH (ref 4.0–10.5)
nRBC: 0 % (ref 0.0–0.2)

## 2020-07-15 LAB — BASIC METABOLIC PANEL
Anion gap: 9 (ref 5–15)
BUN: 26 mg/dL — ABNORMAL HIGH (ref 8–23)
CO2: 27 mmol/L (ref 22–32)
Calcium: 8.1 mg/dL — ABNORMAL LOW (ref 8.9–10.3)
Chloride: 97 mmol/L — ABNORMAL LOW (ref 98–111)
Creatinine, Ser: 1.63 mg/dL — ABNORMAL HIGH (ref 0.44–1.00)
GFR, Estimated: 30 mL/min — ABNORMAL LOW (ref >60)
Glucose, Bld: 101 mg/dL — ABNORMAL HIGH (ref 70–99)
Potassium: 4.2 mmol/L (ref 3.5–5.1)
Sodium: 133 mmol/L — ABNORMAL LOW (ref 135–145)

## 2020-07-15 LAB — SODIUM, URINE, RANDOM: Sodium, Ur: 21 mmol/L

## 2020-07-15 LAB — CREATININE, URINE, RANDOM: Creatinine, Urine: 108.79 mg/dL

## 2020-07-15 LAB — LIPASE, BLOOD: Lipase: 1086 U/L — ABNORMAL HIGH (ref 11–51)

## 2020-07-15 MED ORDER — LACTATED RINGERS IV BOLUS
500.0000 mL | Freq: Once | INTRAVENOUS | Status: AC
  Administered 2020-07-15: 500 mL via INTRAVENOUS

## 2020-07-15 MED ORDER — CIPROFLOXACIN IN D5W 400 MG/200ML IV SOLN
400.0000 mg | INTRAVENOUS | Status: DC
  Administered 2020-07-16: 400 mg via INTRAVENOUS
  Filled 2020-07-15: qty 200

## 2020-07-15 MED ORDER — LACTATED RINGERS IV SOLN: INTRAVENOUS | Status: AC

## 2020-07-15 MED ORDER — GADOBUTROL 1 MMOL/ML IV SOLN
9.0000 mL | Freq: Once | INTRAVENOUS | Status: AC | PRN
  Administered 2020-07-15: 9 mL via INTRAVENOUS

## 2020-07-15 MED ORDER — METRONIDAZOLE 500 MG/100ML IV SOLN
500.0000 mg | Freq: Three times a day (TID) | INTRAVENOUS | Status: DC
  Administered 2020-07-15 – 2020-07-17 (×5): 500 mg via INTRAVENOUS
  Filled 2020-07-15 (×5): qty 100

## 2020-07-15 MED ORDER — LORAZEPAM 2 MG/ML IJ SOLN: 0.5000 mg | Freq: Once | INTRAMUSCULAR | Status: DC | PRN

## 2020-07-15 MED ORDER — ALBUTEROL SULFATE HFA 108 (90 BASE) MCG/ACT IN AERS: 2.0000 | INHALATION_SPRAY | RESPIRATORY_TRACT | Status: DC | PRN

## 2020-07-15 NOTE — Anesthesia Preprocedure Evaluation (Addendum)
Anesthesia Evaluation  Patient identified by MRN, date of birth, ID band Patient awake    Reviewed: Allergy & Precautions, Patient's Chart, lab work & pertinent test results  History of Anesthesia Complications (+) PONV and history of anesthetic complications  Airway Mallampati: I  TM Distance: >3 FB Neck ROM: Full    Dental  (+) Edentulous Upper, Edentulous Lower, Dental Advisory Given   Pulmonary neg pulmonary ROS,    Pulmonary exam normal        Cardiovascular hypertension, Pt. on medications Normal cardiovascular exam  IMPRESSIONS    1. Left ventricular ejection fraction, by estimation, is 55 to 60%. The  left ventricle has normal function. The left ventricle has no regional  wall motion abnormalities. Left ventricular diastolic parameters are  consistent with Grade II diastolic  dysfunction (pseudonormalization).  2. Right ventricular systolic function is normal. The right ventricular  size is mildly enlarged. There is mildly elevated pulmonary artery  systolic pressure. The estimated right ventricular systolic pressure is  37.1 mmHg.  3. Right atrial size was mildly dilated.  4. The mitral valve is normal in structure. Mild mitral valve  regurgitation. No evidence of mitral stenosis.  5. The aortic valve is normal in structure. There is mild calcification  of the aortic valve. There is mild thickening of the aortic valve. Aortic  valve regurgitation is not visualized. Mild aortic valve sclerosis is  present, with no evidence of aortic  valve stenosis.  6. The inferior vena cava is normal in size with greater than 50%  respiratory variability, suggesting right atrial pressure of 3 mmHg.    Neuro/Psych PSYCHIATRIC DISORDERS Anxiety Depression negative neurological ROS     GI/Hepatic negative GI ROS, Neg liver ROS,   Endo/Other  Hypothyroidism   Renal/GU negative Renal ROS     Musculoskeletal negative  musculoskeletal ROS (+)   Abdominal   Peds  Hematology negative hematology ROS (+)   Anesthesia Other Findings   Reproductive/Obstetrics                           Anesthesia Physical Anesthesia Plan  ASA: III  Anesthesia Plan: General   Post-op Pain Management:    Induction: Intravenous  PONV Risk Score and Plan: 4 or greater and Ondansetron, Dexamethasone and Treatment may vary due to age or medical condition  Airway Management Planned: Oral ETT  Additional Equipment:   Intra-op Plan:   Post-operative Plan: Extubation in OR  Informed Consent: I have reviewed the patients History and Physical, chart, labs and discussed the procedure including the risks, benefits and alternatives for the proposed anesthesia with the patient or authorized representative who has indicated his/her understanding and acceptance.     Dental advisory given  Plan Discussed with: Anesthesiologist and CRNA  Anesthesia Plan Comments:        Anesthesia Quick Evaluation

## 2020-07-15 NOTE — Progress Notes (Signed)
PHARMACY NOTE:  ANTIMICROBIAL RENAL DOSAGE ADJUSTMENT  Current antimicrobial regimen includes a mismatch between antimicrobial dosage and estimated renal function.  As per policy approved by the Pharmacy & Therapeutics and Medical Executive Committees, the antimicrobial dosage will be adjusted accordingly.   Current antimicrobial dosage:  Cipro 400mg  q12  Indication: IAI  Renal Function:   Estimated Creatinine Clearance: 29.8 mL/min (A) (by C-G formula based on SCr of 1.37 mg/dL (H)). []      On intermittent HD, scheduled: []      On CRRT    Antimicrobial dosage has been changed to:  Cipro 400mg  q24   Additional Comments:    Thank you for allowing pharmacy to be a part of this patient's care.  PharmD 07/15/2020 1:40 PM

## 2020-07-15 NOTE — Progress Notes (Signed)
[facility] Progress Note     Subjective: CC-  Feels about the same as yesterday. Continues to have some upper abdominal soreness requiring pain medication. Pain is more on the right and radiates into her back. Denies n/v. Denies CP or SOB. Off supplemental O2. Lipase up 1086, LFTs up with Tbili 6 from 1.9, Cr up 1.37, and WBC up 20.2 from 12.9.   Objective: Vital signs in last 24 hours: Temp:  [97.6 F (36.4 C)-99 F (37.2 C)] 97.6 F (36.4 C) (05/24 0612) Pulse Rate:  [55-92] 55 (05/24 0612) Resp:  [15-22] 20 (05/24 0612) BP: (104-146)/(50-66) 128/66 (05/24 0612) SpO2:  [89 %-100 %] 90 % (05/24 0612) Weight:  [87.8 kg] 87.8 kg (05/24 0500) Last BM Date:  (PTA)  Intake/Output from previous day: 05/23 0701 - 05/24 0700 In: 1151.1 [P.O.:90; IV Piggyback:1061.1] Out: 0  Intake/Output this shift: No intake/output data recorded.  PE: Gen:  Alert, NAD, pleasant Pulm: rate and effort normal on room air Abd: soft, mild distension, mild upper abdominal TTP RUQ>LUQ, no rebound or guarding, +BS, no masses, hernias, or organomegaly Skin: no rashes noted, warm and dry  Lab Results:  Recent Labs    07/14/20 0837 07/15/20 0407  WBC 12.9* 20.2*  HGB 13.6 12.6  HCT 39.4 37.9  PLT 201 195   BMET Recent Labs    07/14/20 0837 07/15/20 0407  NA 136 135  K 3.7 4.0  CL 100 101  CO2 26 25  GLUCOSE 108* 91  BUN 17 15  CREATININE 0.89 1.37*  CALCIUM 8.4* 8.2*   PT/INR No results for input(s): LABPROT, INR in the last 72 hours. CMP     Component Value Date/Time   NA 135 07/15/2020 0407   NA 139 06/26/2019 1459   K 4.0 07/15/2020 0407   CL 101 07/15/2020 0407   CO2 25 07/15/2020 0407   GLUCOSE 91 07/15/2020 0407   BUN 15 07/15/2020 0407   BUN 20 06/26/2019 1459   CREATININE 1.37 (H) 07/15/2020 0407   CREATININE 1.25 (H) 12/25/2019 1208   CALCIUM 8.2 (L) 07/15/2020 0407   PROT 5.9 (L) 07/15/2020 0407   ALBUMIN 2.9 (L) 07/15/2020 0407   AST 569 (H)  07/15/2020 0407   ALT 578 (H) 07/15/2020 0407   ALKPHOS 133 (H) 07/15/2020 0407   BILITOT 6.0 (H) 07/15/2020 0407   GFRNONAA 37 (L) 07/15/2020 0407   GFRAA 47 (L) 06/26/2019 1459   Lipase     Component Value Date/Time   LIPASE 1,086 (H) 07/15/2020 0407       Studies/Results: DG CHEST PORT 1 VIEW  Result Date: 07/15/2020 CLINICAL DATA:  Abdominal pain for 1 day, RIGHT upper quadrant pain with associated with nausea and vomiting, shaking chills, question choledocholithiasis EXAM: PORTABLE CHEST 1 VIEW COMPARISON:  Portable exam 0810 hours compared to 04/25/2020 FINDINGS: Minimal enlargement of cardiac silhouette. Mediastinal contours and pulmonary vascularity normal. Atherosclerotic calcification of descending thoracic aorta. Lungs clear. No pulmonary infiltrate, pleural effusion, or pneumothorax. Bones demineralized. IMPRESSION: No acute abnormalities. Aortic Atherosclerosis (ICD10-I70.0). Electronically Signed   By: Ulyses Southward M.D.   On: 07/15/2020 08:33   US Abdomen Limited RUQ (LIVER/GB)  Result Date: 07/14/2020 CLINICAL DATA:  Right upper quadrant pain, nausea, and vomiting. EXAM: ULTRASOUND ABDOMEN LIMITED RIGHT UPPER QUADRANT COMPARISON:  Ultrasound abdomen 10/22/2011 FINDINGS: Gallbladder: Sludge noted within the gallbladder lumen. Mild pericholecystic fluid. No gallbladder wall thickening sonographic Eulah Pont sign is positive per technologist. Common bile duct: Diameter: 7 mm Liver: No  focal lesion identified. Within normal limits in parenchymal echogenicity. Portal vein is patent on color Doppler imaging with normal direction of blood flow towards the liver. Other: None. IMPRESSION: Minimal pericholecystic fluid. Gallbladder sludge. Sonographic Eulah Pont sign is positive per technologist. Findings are suspicious for low-level cholecystitis or chronic cholecystitis. Further evaluation with HIDA scan would be beneficial. Electronically Signed   By: Acquanetta Belling M.D.   On: 07/14/2020 10:45     Anti-infectives: Anti-infectives (From admission, onward)   Start     Dose/Rate Route Frequency Ordered Stop   07/14/20 2200  ciprofloxacin (CIPRO) IVPB 400 mg       "And" Linked Group Details   400 mg 200 mL/hr over 60 Minutes Intravenous Every 12 hours 07/14/20 1547     07/14/20 2200  metroNIDAZOLE (FLAGYL) IVPB 500 mg       "And" Linked Group Details   500 mg 100 mL/hr over 60 Minutes Intravenous Every 8 hours 07/14/20 1547     07/14/20 1645  ciprofloxacin (CIPRO) IVPB 400 mg  Status:  Discontinued       "And" Linked Group Details   400 mg 200 mL/hr over 60 Minutes Intravenous Every 12 hours 07/14/20 1545 07/14/20 1547   07/14/20 1645  metroNIDAZOLE (FLAGYL) IVPB 500 mg  Status:  Discontinued       "And" Linked Group Details   500 mg 100 mL/hr over 60 Minutes Intravenous Every 8 hours 07/14/20 1545 07/14/20 1547   07/14/20 1130  ciprofloxacin (CIPRO) IVPB 400 mg       "And" Linked Group Details   400 mg 200 mL/hr over 60 Minutes Intravenous  Once 07/14/20 1122 07/14/20 1312   07/14/20 1130  metroNIDAZOLE (FLAGYL) IVPB 500 mg       "And" Linked Group Details   500 mg 100 mL/hr over 60 Minutes Intravenous  Once 07/14/20 1122 07/14/20 2051       Assessment/Plan PAF on eliquis (last dose 5/22 in PM) HTN HLD Diastolic CHF - cardiologist Dr. Jens Som, ECHO 06/26/19 EF 60-65% Recurrent UTIs Hypothyroidism Anxiety/depression Code status DNR AKI - Cr up 1.37  Acute pancreatitis, suspect gallstone pancreatitis Possible cholecystitis Elevated LFTs - Lipase and LFTs including Tbili are up trending today. MRCP should be completed this morning. GI on board in case there is a common duct stone.  We will follow. Continue to hold eliquis. Continue antibiotics. Ultimately plan for laparoscopic cholecystectomy later this admission once acute issues are resolved and cleared by medicine and cardiology.   ID - cipro/flagyl 5/23>> VTE - sq heparin FEN - IVF per TRH, NPO Foley -  none Follow up - TBD   LOS: 1 day    Franne Forts, [facility] Surgery 07/15/2020, 8:48 AM Please see Amion for pager number during day hours 7:00am-4:30pm

## 2020-07-15 NOTE — Consult Note (Addendum)
Cardiology Consultation:   Patient ID: Alyssa Williamson MRN: [id number]; DOB: May 21, 1931  Admit date: 07/14/2020 Date of Consult: 07/15/2020  PCP:  Bradd Canary, MD   [facility] Providers Cardiologist:  [name], MD        Patient Profile:   Alyssa Williamson is a 87 y.o. female with a PMH of paroxysmal atrial fibrillation, chronic diastolic CHF, HTN, HLD, and hypothyroidism, who is being seen 07/15/2020 for the evaluation of preop assessment at the request of Dr. Lowell Guitar.  History of Present Illness:   Alyssa Williamson was in her usual state of health until 2 weeks ago when she began experiencing nausea vomiting diarrhea and right-sided abdominal pain.  She had associated low-grade fever.  She was seen by her PCP 07/08/2020 for these complaints who initially felt her symptoms may be related to a norovirus infection.  Unfortunately her symptoms progressed prompting her to present to [facility] for further evaluation.  On arrival to the ED vitals were stable.  Labs were notable for transaminitis, leukocytosis, AKI with bump in creatinine from 0.89>1.37, electrolytes WNL, hemoglobin 12.6, platelets 195, BNP 759. EKG showed junctional rhythm, rate 59 bpm, non-specific ST-T wave abnormalities without overt STE/D.  Chest x-ray shows no acute findings.  Abdominal ultrasound showed gallbladder sludge, positive Murphy sign, findings suspicious for low-level cholecystitis or chronic cholecystitis.  General surgery was consulted and recommended to undergo an MRCP to rule out choledocholithiasis.  She was started on IV antibiotics.  Eliquis held in anticipation of possible surgical intervention.  GI also consulted and agreed with MRCP.  Also concern for pancreatitis given lipase elevated to 500s.  Cardiology asked to evaluate for preoperative assessment.  She was last evaluated by cardiology at an outpatient visit with Dr. Jens Som 01/2020, at which time she was doing okay from a cardiac standpoint, though noted to  have some increased DOE. Pro-BNP obtained at that visit was minimally elevated an no changes were made to her medications. She was recommended to follow-up in 6 months at that time. Her last echocardiogram 06/2019 showed EF 60-65%, normal LV diastolic function, no RWMA, normal RV size/function with mildly elevate PA pressures, moderate LAE, and mild MR. NST in 2014 is not available for my review, though reportedly showed breast attenuation but no ischemia.   At the time of this evaluation she is resting comfortably in bed, chatting with a friend at bedside. She feels she has been doing fairly well from a cardiac standpoint prior to this hospitalization. She is a busy lady and states she is always on the go. She has chronic DOE which is unchanged in recent months. She does not get any formal exercise anymore but is able to do her own shopping, household chores, ADLs, and go up a couple flights of stairs without chest pain. She sleeps with the head of her bed chronically elevated for comfort but denies orthopnea, PND, dizziness, lightheadedness, syncope, palpitations, or chest pain.     Past Medical History:  Diagnosis Date  . Allergic rhinitis   . Anemia   . Anxiety   . Arthritis    bilateral knee  . Atrial fibrillation (HCC) 12/21/2016  . Cervical cancer screening 10/30/2013   Menarche at 15 Regular and moderate flow No history of abnormal pap in past G4P3, s/p 3 svd and 1 MC No history of abnormal MGM Noconcerns today No gyn surgeries  . Constipation 11/18/2015  . Depression with anxiety   . Diverticulitis   . Heart murmur   .  Hyperlipidemia   . Hypertension   . Hypothyroid   . Left knee DJD   . Medicare annual wellness visit, subsequent 10/30/2013  . PONV (postoperative nausea and vomiting)   . Preventative health care 05/20/2016    Past Surgical History:  Procedure Laterality Date  . ABDOMINAL HYSTERECTOMY    . bladder tack  04/2010   Dr Marciano Sequinimothy  [place]  . GANGLION CYST EXCISION    .  JOINT REPLACEMENT Right   . REPLACEMENT TOTAL KNEE  10/2010   right knee-Murphy   . TONSILLECTOMY    . TOTAL KNEE ARTHROPLASTY Left 07/26/2012   Procedure: TOTAL KNEE ARTHROPLASTY;  Surgeon: Loreta Aveaniel F Murphy, MD;  [facility];  Service: Orthopedics;  Laterality: Left;     Home Medications:  Prior to Admission medications   Medication Sig Start Date End Date Taking? Authorizing Provider  ALPRAZolam (XANAX) 0.25 MG tablet Take 1 tablet (0.25 mg total) by mouth daily as needed for anxiety. 06/05/20  Yes Bradd CanaryBlyth, Stacey A, MD  atorvastatin (LIPITOR) 40 MG tablet Take 1 tablet (40 mg total) by mouth daily. 02/25/20  Yes Bradd CanaryBlyth, Stacey A, MD  budesonide-formoterol [facility](SYMBICORT) 160-4.5 MCG/ACT inhaler Inhale 2 puffs into the lungs 2 (two) times daily. Patient taking differently: Inhale 2 puffs into the lungs 2 (two) times daily as needed (shortness of breath or wheezing). 04/25/20  Yes Saguier, Ramon DredgeEdward, PA-C  citalopram (CELEXA) 20 MG tablet Take 1 tablet (20 mg total) by mouth daily. 03/25/20  Yes Bradd CanaryBlyth, Stacey A, MD  Cranberry 500 MG CAPS Take 500 mg by mouth daily.   Yes [provider]  ELIQUIS 5 MG TABS tablet Take 1 tablet by mouth twice daily Patient taking differently: Take 5 mg by mouth 2 (two) times daily. 05/26/20  Yes Crenshaw, Madolyn FriezeBrian S, MD  EUTHYROX 75 MCG tablet TAKE 1 TABLET BY MOUTH ONCE DAILY BEFORE BREAKFAST Patient taking differently: Take 75 mcg by mouth daily before breakfast. 05/05/20  Yes Bradd CanaryBlyth, Stacey A, MD  furosemide (LASIX) 20 MG tablet Take 1 tablet (20 mg total) by mouth daily. 01/29/20  Yes Bradd CanaryBlyth, Stacey A, MD  loratadine (CLARITIN) 10 MG tablet Take 10 mg by mouth daily.   Yes [provider]  Multiple Vitamins-Minerals (CENTRUM SILVER PO) Take 1 tablet by mouth daily.   Yes [provider]  verapamil (CALAN-SR) 240 MG CR tablet Take 1 tablet (240 mg total) by mouth daily. 03/25/20  Yes Bradd CanaryBlyth, Stacey A, MD  COVID-19 mRNA Vac-TriS, Pfizer, (PFIZER-BIONT  COVID-19 VAC-TRIS) SUSP injection Inject into the muscle. 06/26/20   Judyann MunsonSnider, Cynthia, MD  levocetirizine (XYZAL) 5 MG tablet Take 1 tablet (5 mg total) by mouth every evening. Patient not taking: Reported on 07/14/2020 04/25/20   Esperanza RichtersSaguier, Edward, PA-C    Inpatient Medications: Scheduled Meds: . fluticasone furoate-vilanterol  1 puff Inhalation Daily  . furosemide  40 mg Intravenous BID  . heparin  5,000 Units Subcutaneous Q8H  . levothyroxine  75 mcg Oral QAC breakfast  . verapamil  240 mg Oral Daily   Continuous Infusions: . ciprofloxacin 400 mg (07/15/20 0920)   And  . metronidazole 500 mg (07/15/20 0548)  . lactated ringers 100 mL/hr at 07/15/20 0916   PRN Meds: acetaminophen **OR** acetaminophen, ALPRAZolam, HYDROcodone-acetaminophen, lip balm, LORazepam, metoprolol tartrate, morphine injection, ondansetron **OR** ondansetron (ZOFRAN) IV  Allergies:    Allergies  Allergen Reactions  . Cefdinir Dermatitis  . Codeine Nausea And Vomiting    Social History:   Social History   Socioeconomic  History  . Marital status: Widowed    Spouse name: Not on file  . Number of children: 3  . Years of education: Not on file  . Highest education level: Not on file  Occupational History  . Not on file  Tobacco Use  . Smoking status: Never Smoker  . Smokeless tobacco: Never Used  Vaping Use  . Vaping Use: Never used  Substance and Sexual Activity  . Alcohol use: No  . Drug use: No  . Sexual activity: Not on file  Other Topics Concern  . Not on file  Social History Narrative  . Not on file   Social Determinants of Health   Financial Resource Strain: Not on file  Food Insecurity: Not on file  Transportation Needs: Not on file  Physical Activity: Not on file  Stress: Not on file  Social Connections: Not on file  Intimate Partner Violence: Not on file    Family History:    Family History  Problem Relation Age of Onset  . Alcohol abuse Father   . Breast cancer Sister   .  Cancer Sister        bladder  . Glaucoma Sister   . Arthritis Daughter   . Diabetes Daughter        type 2  . Cancer Daughter        brain cancer, diagnosed 29 years.agp  . Mental illness Daughter        s/p craniotomies, gamma knife for tumors. numerous shunts.  . Heart failure Mother   . Cancer Brother        glioblastoma   . Glaucoma Brother   . Diverticulosis Sister   . Cancer Sister        liver  . Cancer Brother        melanoma, mets to lung and brain and intestine  . Alcohol abuse Brother   . Cancer Brother        liver  . Glaucoma Maternal Grandfather   . Cancer Sister        breast  . Colon cancer Neg Hx   . Hypertension Neg Hx      ROS:  Please see the history of present illness.   All other ROS reviewed and negative.     Physical Exam/Data:   Vitals:   07/14/20 2210 07/15/20 0154 07/15/20 0500 07/15/20 0612  BP: (!) 112/50 (!) 104/52  128/66  Pulse: (!) 58 (!) 55  (!) 55  Resp: (!) Temp: 98.5 F (36.9 C) 98 F (36.7 C)  97.6 F (36.4 C)  TempSrc: Oral Oral  Oral  SpO2: 91% (!) 89%  90%  Weight:   87.8 kg     Intake/Output Summary (Last 24 hours) at 07/15/2020 1159 Last data filed at 07/14/2020 2359 Gross per 24 hour  Intake 650.8 ml  Output 0 ml  Net 650.8 ml   Last 3 Weights 07/15/2020 07/08/2020 06/09/2020  Weight (lbs) 193 lb 9 oz 185 lb 6.4 oz 187 lb 4.8 oz  Weight (kg) 87.8 kg 84.097 kg 84.959 kg     Body mass index is 35.4 kg/m.  General:  Well nourished, well developed, in no acute distress HEENT: sclera anicteric Lymph: no adenopathy Neck: no JVD Endocrine:  No thryomegaly Vascular: No carotid bruits; distal pulses 2+ bilaterally Cardiac:  normal S1, S2; RRR; no murmus, rubs, or gallops Lungs:  clear to auscultation bilaterally, + late expiratory wheezing, no rhonchi or rales  Abd:  soft, mild RUQ TTP, no hepatomegaly  Ext: trace LE edema Musculoskeletal:  No deformities, BUE and BLE strength normal and equal Skin: warm  and dry  Neuro:  CNs 2-12 intact, no focal abnormalities noted Psych:  Normal affect   EKG:  The EKG was personally reviewed and demonstrates:  junctional rhythm, rate 59 bpm, non-specific ST-T wave abnormalities without overt STE/D Telemetry:  Telemetry was personally reviewed and demonstrates:  Junctional rhythm with HR in the 50s  Relevant CV Studies: Echocardiogram 06/2019: 1. Left ventricular ejection fraction, by estimation, is 60 to 65%. The  left ventricle has normal function. The left ventricle has no regional  wall motion abnormalities.  2. Right ventricular systolic function is normal. The right ventricular  size is normal. There is mildly elevated pulmonary artery systolic  pressure.  3. Left atrial size was moderately dilated.  4. The mitral valve is normal in structure. Mild mitral valve  regurgitation. No evidence of mitral stenosis.  5. The aortic valve is normal in structure. Aortic valve regurgitation is  not visualized. No aortic stenosis is present.  6. The inferior vena cava is normal in size with greater than 50%  respiratory variability, suggesting right atrial pressure of 3 mmHg.     Laboratory Data:  High Sensitivity Troponin:  No results for input(s): TROPONINIHS in the last 720 hours.   Chemistry Recent Labs  Lab 07/14/20 0837 07/15/20 0407  NA 136 135  K 3.7 4.0  CL 100 101  CO2 26 25  GLUCOSE 108* 91  BUN 17 15  CREATININE 0.89 1.37*  CALCIUM 8.4* 8.2*  GFRNONAA >60 37*  ANIONGAP 10 9    Recent Labs  Lab 07/14/20 0837 07/15/20 0407  PROT 6.7 5.9*  ALBUMIN 3.2* 2.9*  AST 636* 569*  ALT 383* 578*  ALKPHOS 119 133*  BILITOT 1.9* 6.0*   Hematology Recent Labs  Lab 07/14/20 0837 07/15/20 0407  WBC 12.9* 20.2*  RBC 4.13 3.80*  HGB 13.6 12.6  HCT 39.4 37.9  MCV 95.4 99.7  MCH 32.9 33.2  MCHC 34.5 33.2  RDW 13.2 14.0  PLT 201 195   BNP Recent Labs  Lab 07/14/20 1732  BNP 759.2*    DDimer No results for input(s):  DDIMER in the last 168 hours.   Radiology/Studies:  DG CHEST PORT 1 VIEW  Result Date: 07/15/2020 CLINICAL DATA:  Abdominal pain for 1 day, RIGHT upper quadrant pain with associated with nausea and vomiting, shaking chills, question choledocholithiasis EXAM: PORTABLE CHEST 1 VIEW COMPARISON:  Portable exam 0810 hours compared to 04/25/2020 FINDINGS: Minimal enlargement of cardiac silhouette. Mediastinal contours and pulmonary vascularity normal. Atherosclerotic calcification of descending thoracic aorta. Lungs clear. No pulmonary infiltrate, pleural effusion, or pneumothorax. Bones demineralized. IMPRESSION: No acute abnormalities. Aortic Atherosclerosis (ICD10-I70.0). Electronically Signed   By: Ulyses Southward M.D.   On: 07/15/2020 08:33   US Abdomen Limited RUQ (LIVER/GB)  Result Date: 07/14/2020 CLINICAL DATA:  Right upper quadrant pain, nausea, and vomiting. EXAM: ULTRASOUND ABDOMEN LIMITED RIGHT UPPER QUADRANT COMPARISON:  Ultrasound abdomen 10/22/2011 FINDINGS: Gallbladder: Sludge noted within the gallbladder lumen. Mild pericholecystic fluid. No gallbladder wall thickening sonographic Eulah Pont sign is positive per technologist. Common bile duct: Diameter: 7 mm Liver: No focal lesion identified. Within normal limits in parenchymal echogenicity. Portal vein is patent on color Doppler imaging with normal direction of blood flow towards the liver. Other: None. IMPRESSION: Minimal pericholecystic fluid. Gallbladder sludge. Sonographic Eulah Pont sign is positive per technologist. Findings are suspicious for low-level  cholecystitis or chronic cholecystitis. Further evaluation with HIDA scan would be beneficial. Electronically Signed   By: Acquanetta Belling M.D.   On: 07/14/2020 10:45     Assessment and Plan:   1. Preop assessment: patient presented with abdominal pain, N/V/D.  Work-up concerning for possible choledocholithiasis and acute pancreatitis.  She is undergoing MRCP with plans for possible ERCP tomorrow  with GI.  General surgery also following along.  Eliquis has been on hold in anticipation of possible procedures.  In regards to her surgical risk, she can complete 4 METs without anginal complaints - Her revised cardiac risk index score is 2 (high risk surgery and history of CHF) giving her a 6.6% risk of adverse cardiac events in the perioperative setting - No further cardiac work-up is necessary prior to surgery  2.  Paroxysmal atrial fibrillation: Patient has a known history of paroxysmal A. fib on Eliquis.  Currently with evidence of junctional rhythm on EKG and telemetry.  Rates well controlled in the 50s. - Agree with holding Eliquis in anticipation of surgery.  Would restart as soon as she is cleared to do so by GI/surgery team -Would continue verapamil in the perioperative setting  3. Chronic diastolic CHF: She has chronic DOE at baseline.  BNP elevated to 700s.  Chest x-ray without acute finding.  She appears euvolemic on exam on exam today.  Home Lasix on hold given recent nausea, vomiting, and diarrhea. - Continue to monitor volume status closely in the perioperative setting - Low threshold to resume p.o. Lasix  4. HTN: BP stable this admission - Continue home verapamil  5. HLD: LDL 67 bpm.  Home atorvastatin on hold in the setting of transaminitis. - Resume atorvastatin once LFTs normalize  6.  Possible choledocholithiasis c/b pancreatitis: Patient presented with 2-week history of nausea vomiting and diarrhea as well as abdominal pain and low-grade fevers.  Found to have transaminitis and elevated lipase on admission.  GI and surgery following.  Patient is undergoing MRCP with plans for possible ERCP tomorrow.  Currently on IV antibiotics - Continue management per primary team, GI, surgery    Risk Assessment/Risk Scores:    New York Heart Association (NYHA) Functional Class NYHA Class II  CHA2DS2-VASc Score = 5  This indicates a 7.2% annual risk of stroke. The patient's  score is based upon: CHF History: Yes HTN History: Yes Diabetes History: No Stroke History: No Vascular Disease History: No Age Score: 2 Gender Score: 1       For questions or updates, please contact [facility] Please consult [email or web address] for contact info under    Signed, Beatriz Stallion, PA-C  07/15/2020 11:59 AM   Patient seen and examined.  Agree with above documentation.  Alyssa Williamson is an 87 year old female with history of paroxysmal atrial fibrillation, chronic diastolic heart failure, hypothyroidism, hypertension who we are consulted for preop evaluation at the request of Dr. Lowell Guitar.  She started having nausea/vomiting/diarrhea and abdominal pain about 2 weeks ago.  Symptoms became progressively worse, prompting her to go to the ED.  In the ED, was noted to have worsening renal function (baseline 0.9 up to 1.37), BNP 759.  EKG appears junctional rhythm, rate 59.  Abdominal ultrasound concerning for cholecystitis.  General surgery/GI consulted.  Cardiology has been consulted for preoperative evaluation.  Reports she has chronic dyspnea but has been unchanged.  States that she can walk up flight of stairs without stopping, denies any chest pain.  Echocardiogram today shows normal biventricular function, grade  2 diastolic dysfunction, mild MR.  On exam, patient is alert and oriented, regular rate and rhythm, no murmurs, lungs CTAB, trace lower extremity edema.  Telemetry shows sinus rhythm with rate 50s to 60s, intermittent junctional rhythm.  For her preop evaluation, no symptoms to suggest active cardiac condition.  Good functional capacity, greater than 4 METS.  RCRI score is 2 (intraperitoneal surgery, CHF).  Overall would classify as intermediate risk and no further cardiac work-up recommended.  Would continue to hold her Eliquis for surgery, can restart once okay per surgery team.   Little Ishikawa, MD

## 2020-07-15 NOTE — Progress Notes (Signed)
PROGRESS NOTE    Alyssa Williamson  TGY:[id number] DOB: 10/21/1931 DOA: 07/14/2020 PCP: Bradd Canary, MD   Chief Complaint  Patient presents with  . Emesis    Brief Narrative:  87 year old female with past medical history of atrial fibrillation on Eliquis, hypertension, hyperlipidemia, hypothyroidism, chronic diastolic CHF who presented to [facility] with complaints of nausea, vomiting and right-sided abdominal pain with associated diarrhea for almost 2 weeks.  Associated low-grade fever.  She was treated with Bactrim for Sabeen Piechocki UTI on 4/18.  Saw her PCP on 5/17 when the patient reported her GI symptoms and her PCP initially felt patient had Fleetwood Pierron norovirus infection as her symptoms initially resolved.  Unfortunately her symptoms of right flank pain radiating to her lower abdomen with emesis and low-grade fevers recurred, prompting her to go to the ED.  She's been admitted with gallstone pancreatitis.   Assessment & Plan:   Principal Problem:   Cholecystitis Active Problems:   Hypothyroidism   HTN (hypertension)   Hyperlipidemia   Recurrent UTI   Atrial fibrillation (HCC)   Acute on chronic diastolic CHF (congestive heart failure) (HCC)  1. Gallstone Pancreatitis  Concern for choledocholithiasis +/- cholangitis/cholecystitis Earlisha Sharples. Bilirubin up today (6), lipase up (1086), AST down to 569, ALT up to 578 b. MRCP with intra and extrahepatic biliary ductal dilatation, concern for tiny calculus at ampula.  Tiny gallstones in distended gallbladder.  No imaging evidence of acute pancreatiits or complication.  No gallbladder wall thickening or pericholecystic fluid.  c. Per general surgery, hold Eliquis, okay for DVT prophylaxis -> plan for lap chole later this admission d. Appreciate GI recs -  e. Continue Cipro/Flagyl f. Resume IVF cautiously g. Strict I/O, daily weights h. Clear liquid diet  2. Suspected acute on chronic diastolic CHF Kijana Estock. Patient felt to be overloaded on admission, appears  comfortable with modest LE edema today b. Echo with EF 55-60%, grade II diastolic dysfunction (see report) c. Hold further lasix with bump in creatinine, cautious IVF in setting of pancreatitis above d. Strict I/O, daily weights e. Elevated BNP, EKG with junctional rhythm f. May need lasix again during hospitalization, though with above, favor IVF initially as tolerated  3. Acute Kidney Injury 1. Follow with IVF 2. 1.37 today (0.89 yesterday)  4. Atrial fibrillation, rate controlled Kamille Toomey. Continue home verapamil b. Holding Eliquis as above  4. Hypertension Ishanvi Mcquitty. Continue verapamil  5. Hyperlipidemia Mikel Pyon. Holding statin due to elevated LFTs  6. Hypothyroidism Anyssa Sharpless. Continue levothyroxine  7. Asymptomatic UTI Jonte Wollam. Getting antibiotics as above  8. Anxiety/depression Michela Herst. Continue as needed Xanax b. Hold Celexa to decrease pill burden for now  DVT prophylaxis: heparin Code Status: DNR Family Communication:daughter at bedside Disposition:   Status is: Inpatient  Remains inpatient appropriate because:Inpatient level of care appropriate due to severity of illness   Dispo: The patient is from: Home              Anticipated d/c is to: pending\              Patient currently is not medically stable to d/c.   Difficult to place patient No       Consultants:   GI  Surgery  Cardiology  Procedures:  Echo IMPRESSIONS    1. Left ventricular ejection fraction, by estimation, is 55 to 60%. The  left ventricle has normal function. The left ventricle has no regional  wall motion abnormalities. Left ventricular diastolic parameters are  consistent with Grade II diastolic  dysfunction (pseudonormalization).  2. Right ventricular systolic function is normal. The right ventricular  size is mildly enlarged. There is mildly elevated pulmonary artery  systolic pressure. The estimated right ventricular systolic pressure is  37.1 mmHg.  3. Right atrial size was mildly dilated.   4. The mitral valve is normal in structure. Mild mitral valve  regurgitation. No evidence of mitral stenosis.  5. The aortic valve is normal in structure. There is mild calcification  of the aortic valve. There is mild thickening of the aortic valve. Aortic  valve regurgitation is not visualized. Mild aortic valve sclerosis is  present, with no evidence of aortic  valve stenosis.  6. The inferior vena cava is normal in size with greater than 50%  respiratory variability, suggesting right atrial pressure of 3 mmHg.  Antimicrobials:  Anti-infectives (From admission, onward)   Start     Dose/Rate Route Frequency Ordered Stop   07/16/20 0900  ciprofloxacin (CIPRO) IVPB 400 mg       "And" Linked Group Details   400 mg 200 mL/hr over 60 Minutes Intravenous Every 24 hours 07/15/20 1326     07/15/20 1415  metroNIDAZOLE (FLAGYL) IVPB 500 mg       "And" Linked Group Details   500 mg 100 mL/hr over 60 Minutes Intravenous Every 8 hours 07/15/20 1326     07/14/20 2200  ciprofloxacin (CIPRO) IVPB 400 mg  Status:  Discontinued       "And" Linked Group Details   400 mg 200 mL/hr over 60 Minutes Intravenous Every 12 hours 07/14/20 1547 07/15/20 1326   07/14/20 2200  metroNIDAZOLE (FLAGYL) IVPB 500 mg  Status:  Discontinued       "And" Linked Group Details   500 mg 100 mL/hr over 60 Minutes Intravenous Every 8 hours 07/14/20 1547 07/15/20 1326   07/14/20 1645  ciprofloxacin (CIPRO) IVPB 400 mg  Status:  Discontinued       "And" Linked Group Details   400 mg 200 mL/hr over 60 Minutes Intravenous Every 12 hours 07/14/20 1545 07/14/20 1547   07/14/20 1645  metroNIDAZOLE (FLAGYL) IVPB 500 mg  Status:  Discontinued       "And" Linked Group Details   500 mg 100 mL/hr over 60 Minutes Intravenous Every 8 hours 07/14/20 1545 07/14/20 1547   07/14/20 1130  ciprofloxacin (CIPRO) IVPB 400 mg       "And" Linked Group Details   400 mg 200 mL/hr over 60 Minutes Intravenous  Once 07/14/20 1122 07/14/20  1312   07/14/20 1130  metroNIDAZOLE (FLAGYL) IVPB 500 mg       "And" Linked Group Details   500 mg 100 mL/hr over 60 Minutes Intravenous  Once 07/14/20 1122 07/14/20 2051         Subjective: No SOB, feels like R sided msk pain  Objective: Vitals:   07/15/20 0500 07/15/20 0612 07/15/20 1227 07/15/20 1242  BP:  128/66 (!) 104/57   Pulse:  (!) 55 65 66  Resp:  20 18   Temp:  97.6 F (36.4 C) (!) 97.5 F (36.4 C)   TempSrc:  Oral Oral   SpO2:  90% (!) 88% 90%  Weight: 87.8 kg       Intake/Output Summary (Last 24 hours) at 07/15/2020 1732 Last data filed at 07/14/2020 2359 Gross per 24 hour  Intake 390 ml  Output --  Net 390 ml   Filed Weights   07/15/20 0500  Weight: 87.8 kg    Examination:  General  exam: Appears calm and comfortable  Respiratory system: Clear to auscultation. Respiratory effort normal. Transmitted upper airway sounds. Cardiovascular system: RRR Gastrointestinal system: RUQ TTP  Central nervous system: Alert and oriented. No focal neurological deficits. Extremities: trace LE edema Skin: No rashes, lesions or ulcers Psychiatry: Judgement and insight appear normal. Mood & affect appropriate.     Data Reviewed: I have personally reviewed following labs and imaging studies  CBC: Recent Labs  Lab 07/14/20 0837 07/15/20 0407  WBC 12.9* 20.2*  HGB 13.6 12.6  HCT 39.4 37.9  MCV 95.4 99.7  PLT 201 195    Basic Metabolic Panel: Recent Labs  Lab 07/14/20 0837 07/15/20 0407  NA 136 135  K 3.7 4.0  CL 100 101  CO2 26 25  GLUCOSE 108* 91  BUN 17 15  CREATININE 0.89 1.37*  CALCIUM 8.4* 8.2*    GFR: Estimated Creatinine Clearance: 29.8 mL/min (Adilson Grafton) (by C-G formula based on SCr of 1.37 mg/dL (H)).  Liver Function Tests: Recent Labs  Lab 07/14/20 0837 07/15/20 0407  AST 636* 569*  ALT 383* 578*  ALKPHOS 119 133*  BILITOT 1.9* 6.0*  PROT 6.7 5.9*  ALBUMIN 3.2* 2.9*    CBG: No results for input(s): GLUCAP in the last 168  hours.   Recent Results (from the past 240 hour(s))  Resp Panel by RT-PCR (Flu Izaiah Tabb&B, Covid) Nasopharyngeal Swab     Status: None   Collection Time: 07/14/20 10:39 AM   Specimen: Nasopharyngeal Swab; Nasopharyngeal(NP) swabs in vial transport medium  Result Value Ref Range Status   SARS Coronavirus 2 by RT PCR NEGATIVE NEGATIVE Final    Comment: (NOTE) SARS-CoV-2 target nucleic acids are NOT DETECTED.  The SARS-CoV-2 RNA is generally detectable in upper respiratory specimens during the acute phase of infection. The lowest concentration of SARS-CoV-2 viral copies this assay can detect is 138 copies/mL. Keigan Girten negative result does not preclude SARS-Cov-2 infection and should not be used as the sole basis for treatment or other patient management decisions. Aayat Hajjar negative result may occur with  improper specimen collection/handling, submission of specimen other than nasopharyngeal swab, presence of viral mutation(s) within the areas targeted by this assay, and inadequate number of viral copies(<138 copies/mL). Salaam Battershell negative result must be combined with clinical observations, patient history, and epidemiological information. The expected result is Negative.  Fact Sheet for Patients:  BloggerCourse.comhttps://www.fda.gov/media/152166/download  Fact Sheet for Healthcare Providers:  SeriousBroker.ithttps://www.fda.gov/media/152162/download  This test is no t yet approved or cleared by the [place] FDA and  has been authorized for detection and/or diagnosis of SARS-CoV-2 by FDA under an Emergency Use Authorization (EUA). This EUA will remain  in effect (meaning this test can be used) for the duration of the COVID-19 declaration under Section 564(b)(1) of the Act, 21 U.S.C.section 360bbb-3(b)(1), unless the authorization is terminated  or revoked sooner.       Influenza Kris Burd by PCR NEGATIVE NEGATIVE Final   Influenza B by PCR NEGATIVE NEGATIVE Final    Comment: (NOTE) The Xpert Xpress SARS-CoV-2/FLU/RSV plus assay is intended  as an aid in the diagnosis of influenza from Nasopharyngeal swab specimens and should not be used as Sandrea Boer sole basis for treatment. Nasal washings and aspirates are unacceptable for Xpert Xpress SARS-CoV-2/FLU/RSV testing.  Fact Sheet for Patients: BloggerCourse.comhttps://www.fda.gov/media/152166/download  Fact Sheet for Healthcare Providers: SeriousBroker.ithttps://www.fda.gov/media/152162/download  This test is not yet approved or cleared by the [place] FDA and has been authorized for detection and/or diagnosis of SARS-CoV-2 by FDA under an Emergency Use Authorization (  EUA). This EUA will remain in effect (meaning this test can be used) for the duration of the COVID-19 declaration under Section 564(b)(1) of the Act, 21 U.S.C. section 360bbb-3(b)(1), unless the authorization is terminated or revoked.  Performed at [facility], [address] [place], [place] [address]          Radiology Studies: MR 3D Recon At Scanner  Result Date: 07/15/2020 CLINICAL DATA:  Jaundice, acute pancreatitis, concern for gallstone pancreatitis, abnormal LFTs, suspected choledocholithiasis EXAM: MRI ABDOMEN WITHOUT AND WITH CONTRAST (INCLUDING MRCP) TECHNIQUE: Multiplanar multisequence MR imaging of the abdomen was performed both before and after the administration of intravenous contrast. Heavily T2-weighted images of the biliary and pancreatic ducts were obtained, and three-dimensional MRCP images were rendered by post processing. CONTRAST:  9mL GADAVIST GADOBUTROL 1 MMOL/ML IV SOLN COMPARISON:  Right upper quadrant ultrasound, 07/14/2020 FINDINGS: Examination is generally somewhat limited by body habitus and breath motion artifact throughout. Lower chest: No acute findings. Hepatobiliary: No mass or other parenchymal abnormality identified. Tiny gallstones in the distended gallbladder (series 11, image 19). Intra and extrahepatic biliary ductal dilatation, common bile duct measuring up to 1.0 cm. Although not  definitive, appearance of the distal common bile duct is suspicious for the presence of Murray Durrell tiny calculus at the ampulla (series 10, image 11). Pancreas: No mass, inflammatory changes, or other parenchymal abnormality identified. Spleen:  Within normal limits in size and appearance. Adrenals/Urinary Tract: No masses identified. No evidence of hydronephrosis. Stomach/Bowel: Visualized portions within the abdomen are unremarkable. Vascular/Lymphatic: No pathologically enlarged lymph nodes identified. No abdominal aortic aneurysm demonstrated. Other:  None. Musculoskeletal: No suspicious bone lesions identified. IMPRESSION: 1. Intra and extrahepatic biliary ductal dilatation, common bile duct measuring up to 1.0 cm. Although not definitive, appearance of the distal common bile duct is suspicious for the presence of Taleshia Luff tiny calculus at the ampulla. 2. Tiny gallstones in the distended gallbladder. No gallbladder wall thickening or pericholecystic fluid. 3. No imaging evidence of acute pancreatitis or complication such as acute pancreatic fluid collection. Electronically Signed   By: Lauralyn Primes M.D.   On: 07/15/2020 12:35   DG CHEST PORT 1 VIEW  Result Date: 07/15/2020 CLINICAL DATA:  Abdominal pain for 1 day, RIGHT upper quadrant pain with associated with nausea and vomiting, shaking chills, question choledocholithiasis EXAM: PORTABLE CHEST 1 VIEW COMPARISON:  Portable exam 0810 hours compared to 04/25/2020 FINDINGS: Minimal enlargement of cardiac silhouette. Mediastinal contours and pulmonary vascularity normal. Atherosclerotic calcification of descending thoracic aorta. Lungs clear. No pulmonary infiltrate, pleural effusion, or pneumothorax. Bones demineralized. IMPRESSION: No acute abnormalities. Aortic Atherosclerosis (ICD10-I70.0). Electronically Signed   By: Ulyses Southward M.D.   On: 07/15/2020 08:33   MR ABDOMEN MRCP W WO CONTAST  Result Date: 07/15/2020 CLINICAL DATA:  Jaundice, acute pancreatitis, concern  for gallstone pancreatitis, abnormal LFTs, suspected choledocholithiasis EXAM: MRI ABDOMEN WITHOUT AND WITH CONTRAST (INCLUDING MRCP) TECHNIQUE: Multiplanar multisequence MR imaging of the abdomen was performed both before and after the administration of intravenous contrast. Heavily T2-weighted images of the biliary and pancreatic ducts were obtained, and three-dimensional MRCP images were rendered by post processing. CONTRAST:  9mL GADAVIST GADOBUTROL 1 MMOL/ML IV SOLN COMPARISON:  Right upper quadrant ultrasound, 07/14/2020 FINDINGS: Examination is generally somewhat limited by body habitus and breath motion artifact throughout. Lower chest: No acute findings. Hepatobiliary: No mass or other parenchymal abnormality identified. Tiny gallstones in the distended gallbladder (series 11, image 19). Intra and extrahepatic biliary ductal dilatation, common bile duct  measuring up to 1.0 cm. Although not definitive, appearance of the distal common bile duct is suspicious for the presence of Beretta Ginsberg tiny calculus at the ampulla (series 10, image 11). Pancreas: No mass, inflammatory changes, or other parenchymal abnormality identified. Spleen:  Within normal limits in size and appearance. Adrenals/Urinary Tract: No masses identified. No evidence of hydronephrosis. Stomach/Bowel: Visualized portions within the abdomen are unremarkable. Vascular/Lymphatic: No pathologically enlarged lymph nodes identified. No abdominal aortic aneurysm demonstrated. Other:  None. Musculoskeletal: No suspicious bone lesions identified. IMPRESSION: 1. Intra and extrahepatic biliary ductal dilatation, common bile duct measuring up to 1.0 cm. Although not definitive, appearance of the distal common bile duct is suspicious for the presence of Ellionna Buckbee tiny calculus at the ampulla. 2. Tiny gallstones in the distended gallbladder. No gallbladder wall thickening or pericholecystic fluid. 3. No imaging evidence of acute pancreatitis or complication such as acute  pancreatic fluid collection. Electronically Signed   By: Lauralyn Primes M.D.   On: 07/15/2020 12:35   ECHOCARDIOGRAM COMPLETE  Result Date: 07/15/2020    ECHOCARDIOGRAM REPORT   Patient Name:   KEIONDRA BROOKOVER Date of Exam: 07/15/2020 Medical Rec #:  [id number]     Height:       62.0 in Accession #:    [id number]    Weight:       193.6 lb Date of Birth:  June 19, 1931     BSA:          1.885 m Patient Age:    89 years      BP:           128/66 mmHg Patient Gender: F             HR:           69 bpm. Exam Location:  Inpatient Procedure: 2D Echo, Cardiac Doppler and Color Doppler Indications:    CHF  History:        Patient has prior history of Echocardiogram examinations, most                 recent 06/26/2019. Arrythmias:Atrial Fibrillation,                 Signs/Symptoms:Murmur; Risk Factors:Hypertension and                 Dyslipidemia.  Sonographer:    Neomia Dear RDCS Referring Phys: [id number] Jae Dire  Sonographer Comments: Patient is morbidly obese. Image acquisition challenging due to patient body habitus. IMPRESSIONS  1. Left ventricular ejection fraction, by estimation, is 55 to 60%. The left ventricle has normal function. The left ventricle has no regional wall motion abnormalities. Left ventricular diastolic parameters are consistent with Grade II diastolic dysfunction (pseudonormalization).  2. Right ventricular systolic function is normal. The right ventricular size is mildly enlarged. There is mildly elevated pulmonary artery systolic pressure. The estimated right ventricular systolic pressure is 37.1 mmHg.  3. Right atrial size was mildly dilated.  4. The mitral valve is normal in structure. Mild mitral valve regurgitation. No evidence of mitral stenosis.  5. The aortic valve is normal in structure. There is mild calcification of the aortic valve. There is mild thickening of the aortic valve. Aortic valve regurgitation is not visualized. Mild aortic valve sclerosis is present, with no evidence of aortic  valve stenosis.  6. The inferior vena cava is normal in size with greater than 50% respiratory variability, suggesting right atrial pressure of 3 mmHg. FINDINGS  Left Ventricle: Left ventricular ejection fraction,  by estimation, is 55 to 60%. The left ventricle has normal function. The left ventricle has no regional wall motion abnormalities. The left ventricular internal cavity size was normal in size. There is  no left ventricular hypertrophy. Left ventricular diastolic parameters are consistent with Grade II diastolic dysfunction (pseudonormalization). Right Ventricle: The right ventricular size is mildly enlarged. No increase in right ventricular wall thickness. Right ventricular systolic function is normal. There is mildly elevated pulmonary artery systolic pressure. The tricuspid regurgitant velocity is 2.92 m/s, and with an assumed right atrial pressure of 3 mmHg, the estimated right ventricular systolic pressure is 37.1 mmHg. Left Atrium: Left atrial size was normal in size. Right Atrium: Right atrial size was mildly dilated. Pericardium: There is no evidence of pericardial effusion. Mitral Valve: The mitral valve is normal in structure. Mild mitral annular calcification. Mild mitral valve regurgitation. No evidence of mitral valve stenosis. MV peak gradient, 8.0 mmHg. The mean mitral valve gradient is 3.0 mmHg. Tricuspid Valve: The tricuspid valve is normal in structure. Tricuspid valve regurgitation is trivial. No evidence of tricuspid stenosis. Aortic Valve: The aortic valve is normal in structure. There is mild calcification of the aortic valve. There is mild thickening of the aortic valve. Aortic valve regurgitation is not visualized. Mild aortic valve sclerosis is present, with no evidence of aortic valve stenosis. Aortic valve mean gradient measures 6.0 mmHg. Aortic valve peak gradient measures 10.2 mmHg. Aortic valve area, by VTI measures 1.93 cm. Pulmonic Valve: The pulmonic valve was normal in  structure. Pulmonic valve regurgitation is not visualized. No evidence of pulmonic stenosis. Aorta: The aortic root is normal in size and structure. Venous: The inferior vena cava is normal in size with greater than 50% respiratory variability, suggesting right atrial pressure of 3 mmHg. IAS/Shunts: No atrial level shunt detected by color flow Doppler.  LEFT VENTRICLE PLAX 2D LVIDd:         5.20 cm     Diastology LVIDs:         2.60 cm     LV e' medial:    5.18 cm/s LV PW:         1.00 cm     LV E/e' medial:  22.4 LV IVS:        1.00 cm     LV e' lateral:   5.70 cm/s LVOT diam:     1.80 cm     LV E/e' lateral: 20.4 LV SV:         67 LV SV Index:   36 LVOT Area:     2.54 cm  LV Volumes (MOD) LV vol d, MOD A2C: 51.1 ml LV vol d, MOD A4C: 41.6 ml LV vol s, MOD A2C: 23.2 ml LV vol s, MOD A4C: 24.3 ml LV SV MOD A2C:     27.9 ml LV SV MOD A4C:     41.6 ml LV SV MOD BP:      21.0 ml RIGHT VENTRICLE RV Basal diam:  4.20 cm RV Mid diam:    2.50 cm RV S prime:     13.10 cm/s TAPSE (M-mode): 2.6 cm LEFT ATRIUM             Index       RIGHT ATRIUM           Index LA diam:        4.10 cm 2.17 cm/m  RA Area:     15.10 cm LA Vol (A2C):   54.3 ml 28.80 ml/m  RA Volume:   37.40 ml  19.84 ml/m LA Vol (A4C):   61.4 ml 32.57 ml/m LA Biplane Vol: 61.9 ml 32.83 ml/m  AORTIC VALVE                    PULMONIC VALVE AV Area (Vmax):    1.65 cm     PV Vmax:       0.81 m/s AV Area (Vmean):   1.80 cm     PV Vmean:      65.200 cm/s AV Area (VTI):     1.93 cm     PV VTI:        0.234 m AV Vmax:           160.00 cm/s  PV Peak grad:  2.6 mmHg AV Vmean:          109.000 cm/s PV Mean grad:  2.0 mmHg AV VTI:            0.350 m AV Peak Grad:      10.2 mmHg AV Mean Grad:      6.0 mmHg LVOT Vmax:         104.00 cm/s LVOT Vmean:        77.100 cm/s LVOT VTI:          0.265 m LVOT/AV VTI ratio: 0.76  AORTA Ao Root diam: 2.70 cm Ao Asc diam:  2.60 cm MITRAL VALVE                TRICUSPID VALVE MV Area (PHT): 5.62 cm     TR Peak grad:   34.1 mmHg MV  Area VTI:   1.80 cm     TR Vmax:        292.00 cm/s MV Peak grad:  8.0 mmHg MV Mean grad:  3.0 mmHg     SHUNTS MV Vmax:       1.41 m/s     Systemic VTI:  0.26 m MV Vmean:      70.1 cm/s    Systemic Diam: 1.80 cm MV Decel Time: 135 msec MR Peak grad: 72.9 mmHg MR Mean grad: 53.0 mmHg MR Vmax:      427.00 cm/s MR Vmean:     352.0 cm/s MV E velocity: 116.00 cm/s MV Jeannetta Cerutti velocity: 45.90 cm/s MV E/Yen Wandell ratio:  2.53 Donato Schultz MD Electronically signed by Donato Schultz MD Signature Date/Time: 07/15/2020/2:21:55 PM    Final    US Abdomen Limited RUQ (LIVER/GB)  Result Date: 07/14/2020 CLINICAL DATA:  Right upper quadrant pain, nausea, and vomiting. EXAM: ULTRASOUND ABDOMEN LIMITED RIGHT UPPER QUADRANT COMPARISON:  Ultrasound abdomen 10/22/2011 FINDINGS: Gallbladder: Sludge noted within the gallbladder lumen. Mild pericholecystic fluid. No gallbladder wall thickening sonographic Eulah Pont sign is positive per technologist. Common bile duct: Diameter: 7 mm Liver: No focal lesion identified. Within normal limits in parenchymal echogenicity. Portal vein is patent on color Doppler imaging with normal direction of blood flow towards the liver. Other: None. IMPRESSION: Minimal pericholecystic fluid. Gallbladder sludge. Sonographic Eulah Pont sign is positive per technologist. Findings are suspicious for low-level cholecystitis or chronic cholecystitis. Further evaluation with HIDA scan would be beneficial. Electronically Signed   By: Acquanetta Belling M.D.   On: 07/14/2020 10:45        Scheduled Meds: . fluticasone furoate-vilanterol  1 puff Inhalation Daily  . furosemide  40 mg Intravenous BID  . heparin  5,000 Units Subcutaneous Q8H  . levothyroxine  75 mcg Oral QAC breakfast  . verapamil  240  mg Oral Daily   Continuous Infusions: . [START ON 07/16/2020] ciprofloxacin     And  . metronidazole 500 mg (07/15/20 1518)  . lactated ringers 100 mL/hr at 07/15/20 0916     LOS: 1 day    Time spent: over 30 min    Lacretia Nicks, MD [facility]   To contact the attending provider between 7A-7P or the covering provider during after hours 7P-7A, please log into the web site [email or web address] and access using universal [place] password for that web site. If you do not have the password, please call the hospital operator.  07/15/2020, 5:32 PM

## 2020-07-15 NOTE — Progress Notes (Addendum)
[facility] Progress Note  Alyssa Williamson 87 y.o. [birth year]  CC:  Suspected choledocholithiasis, gallstone pancreatitis  Subjective: Patient reports continued RUQ abdominal pain. Denies nausea or vomiting.  ROS : Review of Systems  Respiratory: Negative for cough and shortness of breath.   Cardiovascular: Negative for chest pain and palpitations.  Gastrointestinal: Positive for abdominal pain. Negative for blood in stool, constipation, diarrhea, heartburn, melena, nausea and vomiting.    Objective: Vital signs in last 24 hours: Vitals:   07/15/20 0154 07/15/20 0612  BP: (!) 104/52 128/66  Pulse: (!) 55 (!) 55  Resp: 19 20  Temp: 98 F (36.7 C) 97.6 F (36.4 C)  SpO2: (!) 89% 90%    Physical Exam:  General:  Alert, cooperative, no distress, jaundice  Head:  Normocephalic, without obvious abnormality, atraumatic  Eyes:  Scleral icterus, EOM's intact,   Lungs:   Clear to auscultation bilaterally, respirations unlabored  Heart:  Regular rate and rhythm, S1, S2 normal  Abdomen:   Soft and mildly distended with mild RUQ and epigastric tenderness, bowel sounds active all four quadrants  Extremities: + trace bilateral lower extremity edema    Lab Results: Recent Labs    07/14/20 0837 07/15/20 0407  NA 136 135  K 3.7 4.0  CL 100 101  CO2 26 25  GLUCOSE 108* 91  BUN 17 15  CREATININE 0.89 1.37*  CALCIUM 8.4* 8.2*   Recent Labs    07/14/20 0837 07/15/20 0407  AST 636* 569*  ALT 383* 578*  ALKPHOS 119 133*  BILITOT 1.9* 6.0*  PROT 6.7 5.9*  ALBUMIN 3.2* 2.9*   Recent Labs    07/14/20 0837 07/15/20 0407  WBC 12.9* 20.2*  HGB 13.6 12.6  HCT 39.4 37.9  MCV 95.4 99.7  PLT 201 195   No results for input(s): LABPROT, INR in the last 72 hours.    Assessment: Acute pancreatitis, concern for gallstone pancreatitis given abnormal LFTs and suspected choledocholithiasis +/- cholangitis and cholecystitis.  No significant alcohol use.  Normal lipid panel  as of 06/26/20. -Lipase 1086 as compared to yesterday 543 -T bili 6.0, increased from 1.9 yesterday -AST 569/ ALT 579/ ALP 133 as compared to yesterday AST 636/ALT 383/ALP 119 -Worsening leukocytosis: WBCs 20.2 compared to yesterday 12.9 -Cr 1.37, increased as compared to 0.89 yesterday  A. fib, on Eliquis, last dose 5/22 PM  Diastolic CHF: EF 60 to 65% as of echo 06/2019  Plan: Fluids were stopped by primary team yesterday due to concerns for heart failure.  Discussed with Dr. Elijah Birk that due to worsening lipase and renal function, we need to re-initiate IVF.  IVF bolus administered and fluids started at 100 cc/h.  MRI/MRCP today.  ERCP tomorrow if positive (no availability for ERCP today).  Continue to trend LFTs.  Continue supportive care.  Continue empiric antibiotics.  Continue to hold Eliquis.  [facility] will follow.  Edrick Kins PA-C 07/15/2020, 9:47 AM  Contact #  [phone]

## 2020-07-15 NOTE — H&P (View-Only) (Signed)
[facility] Progress Note  Alyssa Williamson 87 y.o. 11/24/1931  CC:  Suspected choledocholithiasis, gallstone pancreatitis  Subjective: Patient reports continued RUQ abdominal pain. Denies nausea or vomiting.  ROS : Review of Systems  Respiratory: Negative for cough and shortness of breath.   Cardiovascular: Negative for chest pain and palpitations.  Gastrointestinal: Positive for abdominal pain. Negative for blood in stool, constipation, diarrhea, heartburn, melena, nausea and vomiting.    Objective: Vital signs in last 24 hours: Vitals:   07/15/20 0154 07/15/20 0612  BP: (!) 104/52 128/66  Pulse: (!) 55 (!) 55  Resp: 19 20  Temp: 98 F (36.7 C) 97.6 F (36.4 C)  SpO2: (!) 89% 90%    Physical Exam:  General:  Alert, cooperative, no distress, jaundice  Head:  Normocephalic, without obvious abnormality, atraumatic  Eyes:  Scleral icterus, EOM's intact,   Lungs:   Clear to auscultation bilaterally, respirations unlabored  Heart:  Regular rate and rhythm, S1, S2 normal  Abdomen:   Soft and mildly distended with mild RUQ and epigastric tenderness, bowel sounds active all four quadrants  Extremities: + trace bilateral lower extremity edema    Lab Results: Recent Labs    07/14/20 0837 07/15/20 0407  NA 136 135  K 3.7 4.0  CL 100 101  CO2 26 25  GLUCOSE 108* 91  BUN 17 15  CREATININE 0.89 1.37*  CALCIUM 8.4* 8.2*   Recent Labs    07/14/20 0837 07/15/20 0407  AST 636* 569*  ALT 383* 578*  ALKPHOS 119 133*  BILITOT 1.9* 6.0*  PROT 6.7 5.9*  ALBUMIN 3.2* 2.9*   Recent Labs    07/14/20 0837 07/15/20 0407  WBC 12.9* 20.2*  HGB 13.6 12.6  HCT 39.4 37.9  MCV 95.4 99.7  PLT 201 195   No results for input(s): LABPROT, INR in the last 72 hours.    Assessment: Acute pancreatitis, concern for gallstone pancreatitis given abnormal LFTs and suspected choledocholithiasis +/- cholangitis and cholecystitis.  No significant alcohol use.  Normal lipid panel  as of 06/26/20. -Lipase 1086 as compared to yesterday 543 -T bili 6.0, increased from 1.9 yesterday -AST 569/ ALT 579/ ALP 133 as compared to yesterday AST 636/ALT 383/ALP 119 -Worsening leukocytosis: WBCs 20.2 compared to yesterday 12.9 -Cr 1.37, increased as compared to 0.89 yesterday  A. fib, on Eliquis, last dose 5/22 PM  Diastolic CHF: EF 60 to 65% as of echo 06/2019  Plan: Fluids were stopped by primary team yesterday due to concerns for heart failure.  Discussed with Dr. Caldwell that due to worsening lipase and renal function, we need to re-initiate IVF.  IVF bolus administered and fluids started at 100 cc/h.  MRI/MRCP today.  ERCP tomorrow if positive (no availability for ERCP today).  Continue to trend LFTs.  Continue supportive care.  Continue empiric antibiotics.  Continue to hold Eliquis.  [facility] will follow.  Timothy Trudell Baron-Johnson PA-C 07/15/2020, 9:47 AM  Contact #  [phone] 

## 2020-07-15 NOTE — Progress Notes (Signed)
*  PRELIMINARY RESULTS* Echocardiogram 2D Echocardiogram has been performed.  Neomia Dear RDCS 07/15/2020, 1:38 PM

## 2020-07-16 ENCOUNTER — Inpatient Hospital Stay (HOSPITAL_COMMUNITY): Payer: Medicare Other | Admitting: Anesthesiology

## 2020-07-16 ENCOUNTER — Inpatient Hospital Stay (HOSPITAL_COMMUNITY): Payer: Medicare Other

## 2020-07-16 ENCOUNTER — Encounter (HOSPITAL_COMMUNITY): Payer: Self-pay | Admitting: Internal Medicine

## 2020-07-16 ENCOUNTER — Encounter (HOSPITAL_COMMUNITY)
Admission: EM | Disposition: A | Discharge status: Home / Self Care | Payer: Self-pay | Source: Home / Self Care | Attending: Internal Medicine

## 2020-07-16 DIAGNOSIS — K851 Biliary acute pancreatitis without necrosis or infection: Secondary | ICD-10-CM | POA: Diagnosis not present

## 2020-07-16 DIAGNOSIS — N390 Urinary tract infection, site not specified: Secondary | ICD-10-CM

## 2020-07-16 DIAGNOSIS — N179 Acute kidney failure, unspecified: Secondary | ICD-10-CM

## 2020-07-16 DIAGNOSIS — K819 Cholecystitis, unspecified: Secondary | ICD-10-CM

## 2020-07-16 DIAGNOSIS — F32A Depression, unspecified: Secondary | ICD-10-CM

## 2020-07-16 DIAGNOSIS — E039 Hypothyroidism, unspecified: Secondary | ICD-10-CM

## 2020-07-16 DIAGNOSIS — R7989 Other specified abnormal findings of blood chemistry: Secondary | ICD-10-CM

## 2020-07-16 DIAGNOSIS — K8050 Calculus of bile duct without cholangitis or cholecystitis without obstruction: Secondary | ICD-10-CM

## 2020-07-16 DIAGNOSIS — I5033 Acute on chronic diastolic (congestive) heart failure: Secondary | ICD-10-CM | POA: Diagnosis not present

## 2020-07-16 HISTORY — PX: SPHINCTEROTOMY: SHX5544

## 2020-07-16 HISTORY — PX: ERCP: SHX5425

## 2020-07-16 HISTORY — PX: REMOVAL OF STONES: SHX5545

## 2020-07-16 LAB — CBC WITH DIFFERENTIAL/PLATELET
Abs Immature Granulocytes: 0.08 10*3/uL — ABNORMAL HIGH (ref 0.00–0.07)
Basophils Absolute: 0.1 10*3/uL (ref 0.0–0.1)
Basophils Relative: 0 %
Eosinophils Absolute: 0.1 10*3/uL (ref 0.0–0.5)
Eosinophils Relative: 0 %
HCT: 37 % (ref 36.0–46.0)
Hemoglobin: 12.1 g/dL (ref 12.0–15.0)
Immature Granulocytes: 1 %
Lymphocytes Relative: 7 %
Lymphs Abs: 1 10*3/uL (ref 0.7–4.0)
MCH: 33.2 pg (ref 26.0–34.0)
MCHC: 32.7 g/dL (ref 30.0–36.0)
MCV: 101.4 fL — ABNORMAL HIGH (ref 80.0–100.0)
Monocytes Absolute: 1.2 10*3/uL — ABNORMAL HIGH (ref 0.1–1.0)
Monocytes Relative: 8 %
Neutro Abs: 12.4 10*3/uL — ABNORMAL HIGH (ref 1.7–7.7)
Neutrophils Relative %: 84 %
Platelets: 226 10*3/uL (ref 150–400)
RBC: 3.65 MIL/uL — ABNORMAL LOW (ref 3.87–5.11)
RDW: 14.1 % (ref 11.5–15.5)
WBC: 14.8 10*3/uL — ABNORMAL HIGH (ref 4.0–10.5)
nRBC: 0 % (ref 0.0–0.2)

## 2020-07-16 LAB — COMPREHENSIVE METABOLIC PANEL
ALT: 394 U/L — ABNORMAL HIGH (ref 0–44)
AST: 301 U/L — ABNORMAL HIGH (ref 15–41)
Albumin: 3.1 g/dL — ABNORMAL LOW (ref 3.5–5.0)
Alkaline Phosphatase: 141 U/L — ABNORMAL HIGH (ref 38–126)
Anion gap: 7 (ref 5–15)
BUN: 26 mg/dL — ABNORMAL HIGH (ref 8–23)
CO2: 28 mmol/L (ref 22–32)
Calcium: 8.3 mg/dL — ABNORMAL LOW (ref 8.9–10.3)
Chloride: 99 mmol/L (ref 98–111)
Creatinine, Ser: 1.64 mg/dL — ABNORMAL HIGH (ref 0.44–1.00)
GFR, Estimated: 30 mL/min — ABNORMAL LOW (ref >60)
Glucose, Bld: 82 mg/dL (ref 70–99)
Potassium: 4.6 mmol/L (ref 3.5–5.1)
Sodium: 134 mmol/L — ABNORMAL LOW (ref 135–145)
Total Bilirubin: 3.2 mg/dL — ABNORMAL HIGH (ref 0.3–1.2)
Total Protein: 6 g/dL — ABNORMAL LOW (ref 6.5–8.1)

## 2020-07-16 LAB — MAGNESIUM: Magnesium: 2 mg/dL (ref 1.7–2.4)

## 2020-07-16 LAB — PHOSPHORUS: Phosphorus: 3.9 mg/dL (ref 2.5–4.6)

## 2020-07-16 LAB — LIPASE, BLOOD: Lipase: 639 U/L — ABNORMAL HIGH (ref 11–51)

## 2020-07-16 SURGERY — ERCP, WITH INTERVENTION IF INDICATED
Anesthesia: General

## 2020-07-16 MED ORDER — PROPOFOL 10 MG/ML IV BOLUS
INTRAVENOUS | Status: DC | PRN
  Administered 2020-07-16: 130 mg via INTRAVENOUS

## 2020-07-16 MED ORDER — PROPOFOL 10 MG/ML IV BOLUS
INTRAVENOUS | Status: AC
  Filled 2020-07-16: qty 20

## 2020-07-16 MED ORDER — INDOMETHACIN 50 MG RE SUPP: 100.0000 mg | Freq: Once | RECTAL | Status: DC

## 2020-07-16 MED ORDER — DEXAMETHASONE SODIUM PHOSPHATE 10 MG/ML IJ SOLN
INTRAMUSCULAR | Status: DC | PRN
  Administered 2020-07-16: 5 mg via INTRAVENOUS

## 2020-07-16 MED ORDER — SUGAMMADEX SODIUM 200 MG/2ML IV SOLN
INTRAVENOUS | Status: DC | PRN
  Administered 2020-07-16: 200 mg via INTRAVENOUS

## 2020-07-16 MED ORDER — SODIUM CHLORIDE 0.9 % IV SOLN: INTRAVENOUS | Status: DC

## 2020-07-16 MED ORDER — GLUCAGON HCL RDNA (DIAGNOSTIC) 1 MG IJ SOLR
INTRAMUSCULAR | Status: AC
  Filled 2020-07-16: qty 2

## 2020-07-16 MED ORDER — LIDOCAINE 2% (20 MG/ML) 5 ML SYRINGE
INTRAMUSCULAR | Status: DC | PRN
  Administered 2020-07-16: 100 mg via INTRAVENOUS

## 2020-07-16 MED ORDER — ONDANSETRON HCL 4 MG/2ML IJ SOLN
INTRAMUSCULAR | Status: DC | PRN
  Administered 2020-07-16: 4 mg via INTRAVENOUS

## 2020-07-16 MED ORDER — EPHEDRINE SULFATE-NACL 50-0.9 MG/10ML-% IV SOSY
PREFILLED_SYRINGE | INTRAVENOUS | Status: DC | PRN
  Administered 2020-07-16: 5 mg via INTRAVENOUS

## 2020-07-16 MED ORDER — ROCURONIUM BROMIDE 10 MG/ML (PF) SYRINGE
PREFILLED_SYRINGE | INTRAVENOUS | Status: DC | PRN
  Administered 2020-07-16: 70 mg via INTRAVENOUS

## 2020-07-16 MED ORDER — FENTANYL CITRATE (PF) 100 MCG/2ML IJ SOLN
INTRAMUSCULAR | Status: AC
  Filled 2020-07-16: qty 2

## 2020-07-16 MED ORDER — INDOMETHACIN 50 MG RE SUPP
RECTAL | Status: AC
  Filled 2020-07-16: qty 2

## 2020-07-16 MED ORDER — ALBUMIN HUMAN 5 % IV SOLN: INTRAVENOUS | Status: DC | PRN

## 2020-07-16 MED ORDER — PHENYLEPHRINE HCL-NACL 20-0.9 MG/250ML-% IV SOLN
INTRAVENOUS | Status: DC | PRN
  Administered 2020-07-16: 25 ug/min via INTRAVENOUS

## 2020-07-16 MED ORDER — FENTANYL CITRATE (PF) 100 MCG/2ML IJ SOLN
INTRAMUSCULAR | Status: DC | PRN
  Administered 2020-07-16: 50 ug via INTRAVENOUS

## 2020-07-16 MED ORDER — GLUCAGON HCL RDNA (DIAGNOSTIC) 1 MG IJ SOLR
INTRAMUSCULAR | Status: DC | PRN
  Administered 2020-07-16 (×2): .25 mg via INTRAVENOUS

## 2020-07-16 MED ORDER — ALBUTEROL SULFATE HFA 108 (90 BASE) MCG/ACT IN AERS
INHALATION_SPRAY | RESPIRATORY_TRACT | Status: DC | PRN
  Administered 2020-07-16: 2 via RESPIRATORY_TRACT
  Administered 2020-07-16: 3 via RESPIRATORY_TRACT

## 2020-07-16 MED ORDER — ALBUMIN HUMAN 5 % IV SOLN
INTRAVENOUS | Status: AC
  Filled 2020-07-16: qty 250

## 2020-07-16 MED ORDER — SODIUM CHLORIDE 0.9 % IV SOLN
INTRAVENOUS | Status: DC | PRN
  Administered 2020-07-16: 50 mL

## 2020-07-16 MED ORDER — INDOMETHACIN 50 MG RE SUPP
RECTAL | Status: DC | PRN
  Administered 2020-07-16: 100 mg via RECTAL

## 2020-07-16 NOTE — Anesthesia Procedure Notes (Signed)
Procedure Name: Intubation Date/Time: 07/16/2020 10:00 AM Performed by: Victoriano Lain, CRNA Pre-anesthesia Checklist: Patient identified, Emergency Drugs available, Suction available, Patient being monitored and Timeout performed Patient Re-evaluated:Patient Re-evaluated prior to induction Oxygen Delivery Method: Circle system utilized Preoxygenation: Pre-oxygenation with 100% oxygen Induction Type: IV induction Ventilation: Mask ventilation without difficulty Laryngoscope Size: Mac and 4 Grade View: Grade I Tube type: Oral Tube size: 7.0 mm Number of attempts: 1 Airway Equipment and Method: Stylet Placement Confirmation: ETT inserted through vocal cords under direct vision,  positive ETCO2 and breath sounds checked- equal and bilateral Secured at: 20 cm Tube secured with: Tape Dental Injury: Teeth and Oropharynx as per pre-operative assessment

## 2020-07-16 NOTE — Op Note (Signed)
[facility] Patient Name: Alyssa Williamson Procedure Date: 07/16/2020 MRN: [id number] Attending MD: Kerin Salen , MD Date of Birth: 1931-06-15 CSN: [id number] Age: 87 Admit Type: Inpatient Procedure:                ERCP Indications:              Common bile duct stone, Abnormal MRCP, Elevated                            liver enzymes, For therapy of acute pancreatitis Providers:                Kerin Salen, MD, Norman Clay, RN, Arlee Muslim                            Tech., Technician, Lauree Chandler. [place], CRNA Referring MD:             [facility], PCP- Danise Edge, MD Medicines:                Monitored Anesthesia Care Complications:            No immediate complications. Estimated Blood Loss:     Estimated blood loss was minimal. Procedure:                Pre-Anesthesia Assessment:                           - Prior to the procedure, a History and Physical                            was performed, and patient medications and                            allergies were reviewed. The patient's tolerance of                            previous anesthesia was also reviewed. The risks                            and benefits of the procedure and the sedation                            options and risks were discussed with the patient.                            All questions were answered, and informed consent                            was obtained. Prior Anticoagulants: The patient has                            taken Eliquis (apixaban), last dose was 3 days                            prior to procedure. ASA Grade Assessment: III - A  patient with severe systemic disease. After                            reviewing the risks and benefits, the patient was                            deemed in satisfactory condition to undergo the                            procedure.                           After obtaining informed consent, the scope was                             passed under direct vision. Throughout the                            procedure, the patient's blood pressure, pulse, and                            oxygen saturations were monitored continuously. The                            TJF-Q180V ([id number]) Olympus Duodenoscope was                            introduced through the mouth, and used to inject                            contrast into and used to inject contrast into the                            bile duct. The ERCP was accomplished without                            difficulty. The patient tolerated the procedure                            well. Scope In: Scope Out: Findings:      The scout film was normal. The esophagus was successfully intubated       under direct vision. The scope was advanced to a normal major papilla in       the descending duodenum without detailed examination of the pharynx,       larynx and associated structures, and upper GI tract. The upper GI tract       was grossly normal.      The ampulla was located on the rim of a medium sized diverticulum.      Initial canulation was challenging. I first tried a standard       sphinctertome, thereafter used an angled wire and eventually had to       switch to a tapered sphinctertome.      After several attempts, the scope had to be placed in a long position       and eventually  the bile duct was deeply cannulated with the tapered       sphincterotome. Contrast was injected. I personally interpreted the bile       duct images. There was brisk flow of contrast through the ducts. Image       quality was excellent. Contrast extended to the main bile duct.      The middle third of the main bile duct contained filling defect thought       to be a stone and sludge.      A straight Roadrunner wire was passed into the biliary tree.      A 10 mm biliary sphincterotomy was made with a braided tapered       sphincterotome using ERBE electrocautery.      There was  no post-sphincterotomy bleeding.      The biliary tree was swept several times with a 12 mm balloon starting       at the bifurcation. Sludge was swept from the duct.      A small stone possibly fell out during the sweep but it was not clearly       evident.      At the conclusion of the procedure, the obstructive cholangiogram did       not reveal any residual filling defect.      Excellent bile flow was noted through the sphincterotomy.      The patient was given rectal indomethacin at the beginning of the       procedure. Impression:               - A filling defect consistent with a stone and                            sludge was seen on the cholangiogram.                           - The examination was suspicious for                            choledocholithiasis.                           - A biliary sphincterotomy was performed.                           - The biliary tree was swept and sludge was found. Moderate Sedation:      Patient did not receive moderate sedation for this procedure, but       instead received monitored anesthesia care. Recommendation:           - Clear liquid diet.                           - Inpatient cholecystectomy.                           - Ongoing management for pancreatitis. Procedure Code(s):        --- Professional ---                           [phone], Endoscopic retrograde  cholangiopancreatography (ERCP); with removal of                            calculi/debris from biliary/pancreatic duct(s)                           [phone], Endoscopic retrograde                            cholangiopancreatography (ERCP); with                            sphincterotomy/papillotomy Diagnosis Code(s):        --- Professional ---                           R93.2, Abnormal findings on diagnostic imaging of                            liver and biliary tract                           K80.50, Calculus of bile duct without cholangitis                             or cholecystitis without obstruction                           R74.8, Abnormal levels of other serum enzymes                           K85.90, Acute pancreatitis without necrosis or                            infection, unspecified CPT copyright 2019 [employer]. All rights reserved. The codes documented in this report are preliminary and upon coder review may  be revised to meet current compliance requirements. Kerin SalenArya Wendell Nicoson, MD 07/16/2020 11:29:50 AM This report has been signed electronically. Number of Addenda: 0

## 2020-07-16 NOTE — Progress Notes (Signed)
[facility] Progress Note  Day of Surgery  Subjective: CC-  Patient reports mild RUQ/epigastric discomfort. No pain at rest, just a little sore with palpation. Denies n/v. She has not taken any pain medication in nearly 24 hours. Lipase down 639, Tbili down 3.2, WBC down 14.8. Going for ERCP later this morning. She does have some wheezing this morning. Denies CP or SOB.  Objective: Vital signs in last 24 hours: Temp:  [97.5 F (36.4 C)-98.2 F (36.8 C)] 98.2 F (36.8 C) (05/25 0529) Pulse Rate:  [62-69] 62 (05/25 0529) Resp:  [16-18] 16 (05/25 0529) BP: (104-124)/(48-62) 115/48 (05/25 0529) SpO2:  [88 %-99 %] 99 % (05/25 0529) Weight:  [92.1 kg] 92.1 kg (05/25 0500) Last BM Date:  (PTA)  Intake/Output from previous day: No intake/output data recorded. Intake/Output this shift: No intake/output data recorded.  PE: Gen:  Alert, NAD, pleasant Pulm: audible wheezing, rate and effort normal on 2L supplemental O2 via [place] Abd: soft, mild distension,very mild epigastric/RUQ TTP without rebound or guarding, no masses, hernias, or organomegaly Skin: no rashes noted, warm and dry   Lab Results:  Recent Labs    07/15/20 0407 07/16/20 0412  WBC 20.2* 14.8*  HGB 12.6 12.1  HCT 37.9 37.0  PLT 195 226   BMET Recent Labs    07/15/20 1750 07/16/20 0412  NA 133* 134*  K 4.2 4.6  CL 97* 99  CO2 27 28  GLUCOSE 101* 82  BUN 26* 26*  CREATININE 1.63* 1.64*  CALCIUM 8.1* 8.3*   PT/INR No results for input(s): LABPROT, INR in the last 72 hours. CMP     Component Value Date/Time   NA 134 (L) 07/16/2020 0412   NA 139 06/26/2019 1459   K 4.6 07/16/2020 0412   CL 99 07/16/2020 0412   CO2 28 07/16/2020 0412   GLUCOSE 82 07/16/2020 0412   BUN 26 (H) 07/16/2020 0412   BUN 20 06/26/2019 1459   CREATININE 1.64 (H) 07/16/2020 0412   CREATININE 1.25 (H) 12/25/2019 1208   CALCIUM 8.3 (L) 07/16/2020 0412   PROT 6.0 (L) 07/16/2020 0412   ALBUMIN 3.1 (L) 07/16/2020  0412   AST 301 (H) 07/16/2020 0412   ALT 394 (H) 07/16/2020 0412   ALKPHOS 141 (H) 07/16/2020 0412   BILITOT 3.2 (H) 07/16/2020 0412   GFRNONAA 30 (L) 07/16/2020 0412   GFRAA 47 (L) 06/26/2019 1459   Lipase     Component Value Date/Time   LIPASE 639 (H) 07/16/2020 0412       Studies/Results: US RENAL  Result Date: 07/15/2020 CLINICAL DATA:  87 year old female with acute renal insufficiency. EXAM: RENAL / URINARY TRACT ULTRASOUND COMPLETE COMPARISON:  Abdominal MRI dated 07/15/2020 FINDINGS: Right Kidney: Renal measurements: 6.7 x 3.7 x 3.9 cm = volume: 51 mL. There is moderate parenchyma atrophy and cortical thinning. Normal echogenicity. No hydronephrosis or shadowing stone. Left Kidney: Renal measurements: 9.7 x 4.5 x 5.0 cm = volume: 150 mL. Mild parenchyma atrophy. Normal echogenicity. No hydronephrosis or shadowing stone. Bladder: Appears normal for degree of bladder distention. Other: None. IMPRESSION: Moderate right and mild left renal parenchyma atrophy. No hydronephrosis or shadowing stone. Electronically Signed   By: Elgie Collard M.D.   On: 07/15/2020 20:35   MR 3D Recon At Scanner  Result Date: 07/15/2020 CLINICAL DATA:  Jaundice, acute pancreatitis, concern for gallstone pancreatitis, abnormal LFTs, suspected choledocholithiasis EXAM: MRI ABDOMEN WITHOUT AND WITH CONTRAST (INCLUDING MRCP) TECHNIQUE: Multiplanar multisequence MR imaging of the abdomen was  performed both before and after the administration of intravenous contrast. Heavily T2-weighted images of the biliary and pancreatic ducts were obtained, and three-dimensional MRCP images were rendered by post processing. CONTRAST:  42mL GADAVIST GADOBUTROL 1 MMOL/ML IV SOLN COMPARISON:  Right upper quadrant ultrasound, 07/14/2020 FINDINGS: Examination is generally somewhat limited by body habitus and breath motion artifact throughout. Lower chest: No acute findings. Hepatobiliary: No mass or other parenchymal abnormality  identified. Tiny gallstones in the distended gallbladder (series 11, image 19). Intra and extrahepatic biliary ductal dilatation, common bile duct measuring up to 1.0 cm. Although not definitive, appearance of the distal common bile duct is suspicious for the presence of a tiny calculus at the ampulla (series 10, image 11). Pancreas: No mass, inflammatory changes, or other parenchymal abnormality identified. Spleen:  Within normal limits in size and appearance. Adrenals/Urinary Tract: No masses identified. No evidence of hydronephrosis. Stomach/Bowel: Visualized portions within the abdomen are unremarkable. Vascular/Lymphatic: No pathologically enlarged lymph nodes identified. No abdominal aortic aneurysm demonstrated. Other:  None. Musculoskeletal: No suspicious bone lesions identified. IMPRESSION: 1. Intra and extrahepatic biliary ductal dilatation, common bile duct measuring up to 1.0 cm. Although not definitive, appearance of the distal common bile duct is suspicious for the presence of a tiny calculus at the ampulla. 2. Tiny gallstones in the distended gallbladder. No gallbladder wall thickening or pericholecystic fluid. 3. No imaging evidence of acute pancreatitis or complication such as acute pancreatic fluid collection. Electronically Signed   By: Lauralyn Primes M.D.   On: 07/15/2020 12:35   DG CHEST PORT 1 VIEW  Result Date: 07/16/2020 CLINICAL DATA:  Shortness of breath. EXAM: PORTABLE CHEST 1 VIEW COMPARISON:  Chest x-ray 07/15/20 FINDINGS: The heart size and mediastinal contours are unchanged. Aortic calcifications. No focal consolidation. No pulmonary edema. No pleural effusion. No pneumothorax. No acute osseous abnormality. IMPRESSION: No active disease. Electronically Signed   By: Tish Frederickson M.D.   On: 07/16/2020 05:27   DG CHEST PORT 1 VIEW  Result Date: 07/15/2020 CLINICAL DATA:  Abdominal pain for 1 day, RIGHT upper quadrant pain with associated with nausea and vomiting, shaking chills,  question choledocholithiasis EXAM: PORTABLE CHEST 1 VIEW COMPARISON:  Portable exam 0810 hours compared to 04/25/2020 FINDINGS: Minimal enlargement of cardiac silhouette. Mediastinal contours and pulmonary vascularity normal. Atherosclerotic calcification of descending thoracic aorta. Lungs clear. No pulmonary infiltrate, pleural effusion, or pneumothorax. Bones demineralized. IMPRESSION: No acute abnormalities. Aortic Atherosclerosis (ICD10-I70.0). Electronically Signed   By: Ulyses Southward M.D.   On: 07/15/2020 08:33   MR ABDOMEN MRCP W WO CONTAST  Result Date: 07/15/2020 CLINICAL DATA:  Jaundice, acute pancreatitis, concern for gallstone pancreatitis, abnormal LFTs, suspected choledocholithiasis EXAM: MRI ABDOMEN WITHOUT AND WITH CONTRAST (INCLUDING MRCP) TECHNIQUE: Multiplanar multisequence MR imaging of the abdomen was performed both before and after the administration of intravenous contrast. Heavily T2-weighted images of the biliary and pancreatic ducts were obtained, and three-dimensional MRCP images were rendered by post processing. CONTRAST:  26mL GADAVIST GADOBUTROL 1 MMOL/ML IV SOLN COMPARISON:  Right upper quadrant ultrasound, 07/14/2020 FINDINGS: Examination is generally somewhat limited by body habitus and breath motion artifact throughout. Lower chest: No acute findings. Hepatobiliary: No mass or other parenchymal abnormality identified. Tiny gallstones in the distended gallbladder (series 11, image 19). Intra and extrahepatic biliary ductal dilatation, common bile duct measuring up to 1.0 cm. Although not definitive, appearance of the distal common bile duct is suspicious for the presence of a tiny calculus at the ampulla (series 10, image 11). Pancreas: No  mass, inflammatory changes, or other parenchymal abnormality identified. Spleen:  Within normal limits in size and appearance. Adrenals/Urinary Tract: No masses identified. No evidence of hydronephrosis. Stomach/Bowel: Visualized portions within  the abdomen are unremarkable. Vascular/Lymphatic: No pathologically enlarged lymph nodes identified. No abdominal aortic aneurysm demonstrated. Other:  None. Musculoskeletal: No suspicious bone lesions identified. IMPRESSION: 1. Intra and extrahepatic biliary ductal dilatation, common bile duct measuring up to 1.0 cm. Although not definitive, appearance of the distal common bile duct is suspicious for the presence of a tiny calculus at the ampulla. 2. Tiny gallstones in the distended gallbladder. No gallbladder wall thickening or pericholecystic fluid. 3. No imaging evidence of acute pancreatitis or complication such as acute pancreatic fluid collection. Electronically Signed   By: Lauralyn Primes M.D.   On: 07/15/2020 12:35   ECHOCARDIOGRAM COMPLETE  Result Date: 07/15/2020    ECHOCARDIOGRAM REPORT   Patient Name:   Alyssa Williamson Date of Exam: 07/15/2020 Medical Rec #:  [id number]     Height:       62.0 in Accession #:    [id number]    Weight:       193.6 lb Date of Birth:  [birth year]     BSA:          1.885 m Patient Age:    87 years      BP:           128/66 mmHg Patient Gender: F             HR:           69 bpm. Exam Location:  Inpatient Procedure: 2D Echo, Cardiac Doppler and Color Doppler Indications:    CHF  History:        Patient has prior history of Echocardiogram examinations, most                 recent 06/26/2019. Arrythmias:Atrial Fibrillation,                 Signs/Symptoms:Murmur; Risk Factors:Hypertension and                 Dyslipidemia.  Sonographer:    Neomia Dear RDCS Referring Phys: [id number] Jae Dire  Sonographer Comments: Patient is morbidly obese. Image acquisition challenging due to patient body habitus. IMPRESSIONS  1. Left ventricular ejection fraction, by estimation, is 55 to 60%. The left ventricle has normal function. The left ventricle has no regional wall motion abnormalities. Left ventricular diastolic parameters are consistent with Grade II diastolic dysfunction  (pseudonormalization).  2. Right ventricular systolic function is normal. The right ventricular size is mildly enlarged. There is mildly elevated pulmonary artery systolic pressure. The estimated right ventricular systolic pressure is 37.1 mmHg.  3. Right atrial size was mildly dilated.  4. The mitral valve is normal in structure. Mild mitral valve regurgitation. No evidence of mitral stenosis.  5. The aortic valve is normal in structure. There is mild calcification of the aortic valve. There is mild thickening of the aortic valve. Aortic valve regurgitation is not visualized. Mild aortic valve sclerosis is present, with no evidence of aortic valve stenosis.  6. The inferior vena cava is normal in size with greater than 50% respiratory variability, suggesting right atrial pressure of 3 mmHg. FINDINGS  Left Ventricle: Left ventricular ejection fraction, by estimation, is 55 to 60%. The left ventricle has normal function. The left ventricle has no regional wall motion abnormalities. The left ventricular internal cavity size was normal in size. There is  no left ventricular hypertrophy. Left ventricular diastolic parameters are consistent with Grade II diastolic dysfunction (pseudonormalization). Right Ventricle: The right ventricular size is mildly enlarged. No increase in right ventricular wall thickness. Right ventricular systolic function is normal. There is mildly elevated pulmonary artery systolic pressure. The tricuspid regurgitant velocity is 2.92 m/s, and with an assumed right atrial pressure of 3 mmHg, the estimated right ventricular systolic pressure is 37.1 mmHg. Left Atrium: Left atrial size was normal in size. Right Atrium: Right atrial size was mildly dilated. Pericardium: There is no evidence of pericardial effusion. Mitral Valve: The mitral valve is normal in structure. Mild mitral annular calcification. Mild mitral valve regurgitation. No evidence of mitral valve stenosis. MV peak gradient, 8.0 mmHg.  The mean mitral valve gradient is 3.0 mmHg. Tricuspid Valve: The tricuspid valve is normal in structure. Tricuspid valve regurgitation is trivial. No evidence of tricuspid stenosis. Aortic Valve: The aortic valve is normal in structure. There is mild calcification of the aortic valve. There is mild thickening of the aortic valve. Aortic valve regurgitation is not visualized. Mild aortic valve sclerosis is present, with no evidence of aortic valve stenosis. Aortic valve mean gradient measures 6.0 mmHg. Aortic valve peak gradient measures 10.2 mmHg. Aortic valve area, by VTI measures 1.93 cm. Pulmonic Valve: The pulmonic valve was normal in structure. Pulmonic valve regurgitation is not visualized. No evidence of pulmonic stenosis. Aorta: The aortic root is normal in size and structure. Venous: The inferior vena cava is normal in size with greater than 50% respiratory variability, suggesting right atrial pressure of 3 mmHg. IAS/Shunts: No atrial level shunt detected by color flow Doppler.  LEFT VENTRICLE PLAX 2D LVIDd:         5.20 cm     Diastology LVIDs:         2.60 cm     LV e' medial:    5.18 cm/s LV PW:         1.00 cm     LV E/e' medial:  22.4 LV IVS:        1.00 cm     LV e' lateral:   5.70 cm/s LVOT diam:     1.80 cm     LV E/e' lateral: 20.4 LV SV:         67 LV SV Index:   36 LVOT Area:     2.54 cm  LV Volumes (MOD) LV vol d, MOD A2C: 51.1 ml LV vol d, MOD A4C: 41.6 ml LV vol s, MOD A2C: 23.2 ml LV vol s, MOD A4C: 24.3 ml LV SV MOD A2C:     27.9 ml LV SV MOD A4C:     41.6 ml LV SV MOD BP:      21.0 ml RIGHT VENTRICLE RV Basal diam:  4.20 cm RV Mid diam:    2.50 cm RV S prime:     13.10 cm/s TAPSE (M-mode): 2.6 cm LEFT ATRIUM             Index       RIGHT ATRIUM           Index LA diam:        4.10 cm 2.17 cm/m  RA Area:     15.10 cm LA Vol (A2C):   54.3 ml 28.80 ml/m RA Volume:   37.40 ml  19.84 ml/m LA Vol (A4C):   61.4 ml 32.57 ml/m LA Biplane Vol: 61.9 ml 32.83 ml/m  AORTIC VALVE  PULMONIC VALVE AV Area (Vmax):    1.65 cm     PV Vmax:       0.81 m/s AV Area (Vmean):   1.80 cm     PV Vmean:      65.200 cm/s AV Area (VTI):     1.93 cm     PV VTI:        0.234 m AV Vmax:           160.00 cm/s  PV Peak grad:  2.6 mmHg AV Vmean:          109.000 cm/s PV Mean grad:  2.0 mmHg AV VTI:            0.350 m AV Peak Grad:      10.2 mmHg AV Mean Grad:      6.0 mmHg LVOT Vmax:         104.00 cm/s LVOT Vmean:        77.100 cm/s LVOT VTI:          0.265 m LVOT/AV VTI ratio: 0.76  AORTA Ao Root diam: 2.70 cm Ao Asc diam:  2.60 cm MITRAL VALVE                TRICUSPID VALVE MV Area (PHT): 5.62 cm     TR Peak grad:   34.1 mmHg MV Area VTI:   1.80 cm     TR Vmax:        292.00 cm/s MV Peak grad:  8.0 mmHg MV Mean grad:  3.0 mmHg     SHUNTS MV Vmax:       1.41 m/s     Systemic VTI:  0.26 m MV Vmean:      70.1 cm/s    Systemic Diam: 1.80 cm MV Decel Time: 135 msec MR Peak grad: 72.9 mmHg MR Mean grad: 53.0 mmHg MR Vmax:      427.00 cm/s MR Vmean:     352.0 cm/s MV E velocity: 116.00 cm/s MV A velocity: 45.90 cm/s MV E/A ratio:  2.53 Donato Schultz MD Electronically signed by Donato Schultz MD Signature Date/Time: 07/15/2020/2:21:55 PM    Final    US Abdomen Limited RUQ (LIVER/GB)  Result Date: 07/14/2020 CLINICAL DATA:  Right upper quadrant pain, nausea, and vomiting. EXAM: ULTRASOUND ABDOMEN LIMITED RIGHT UPPER QUADRANT COMPARISON:  Ultrasound abdomen 10/22/2011 FINDINGS: Gallbladder: Sludge noted within the gallbladder lumen. Mild pericholecystic fluid. No gallbladder wall thickening sonographic Eulah Pont sign is positive per technologist. Common bile duct: Diameter: 7 mm Liver: No focal lesion identified. Within normal limits in parenchymal echogenicity. Portal vein is patent on color Doppler imaging with normal direction of blood flow towards the liver. Other: None. IMPRESSION: Minimal pericholecystic fluid. Gallbladder sludge. Sonographic Eulah Pont sign is positive per technologist. Findings are suspicious for  low-level cholecystitis or chronic cholecystitis. Further evaluation with HIDA scan would be beneficial. Electronically Signed   By: Acquanetta Belling M.D.   On: 07/14/2020 10:45    Anti-infectives: Anti-infectives (From admission, onward)   Start     Dose/Rate Route Frequency Ordered Stop   07/16/20 0900  ciprofloxacin (CIPRO) IVPB 400 mg       "And" Linked Group Details   400 mg 200 mL/hr over 60 Minutes Intravenous Every 24 hours 07/15/20 1326     07/15/20 1415  metroNIDAZOLE (FLAGYL) IVPB 500 mg       "And" Linked Group Details   500 mg 100 mL/hr over 60 Minutes Intravenous Every 8 hours 07/15/20 1326  07/14/20 2200  ciprofloxacin (CIPRO) IVPB 400 mg  Status:  Discontinued       "And" Linked Group Details   400 mg 200 mL/hr over 60 Minutes Intravenous Every 12 hours 07/14/20 1547 07/15/20 1326   07/14/20 2200  metroNIDAZOLE (FLAGYL) IVPB 500 mg  Status:  Discontinued       "And" Linked Group Details   500 mg 100 mL/hr over 60 Minutes Intravenous Every 8 hours 07/14/20 1547 07/15/20 1326   07/14/20 1645  ciprofloxacin (CIPRO) IVPB 400 mg  Status:  Discontinued       "And" Linked Group Details   400 mg 200 mL/hr over 60 Minutes Intravenous Every 12 hours 07/14/20 1545 07/14/20 1547   07/14/20 1645  metroNIDAZOLE (FLAGYL) IVPB 500 mg  Status:  Discontinued       "And" Linked Group Details   500 mg 100 mL/hr over 60 Minutes Intravenous Every 8 hours 07/14/20 1545 07/14/20 1547   07/14/20 1130  ciprofloxacin (CIPRO) IVPB 400 mg       "And" Linked Group Details   400 mg 200 mL/hr over 60 Minutes Intravenous  Once 07/14/20 1122 07/14/20 1312   07/14/20 1130  metroNIDAZOLE (FLAGYL) IVPB 500 mg       "And" Linked Group Details   500 mg 100 mL/hr over 60 Minutes Intravenous  Once 07/14/20 1122 07/14/20 2051       Assessment/Plan PAF on eliquis (last dose5/22 in PM) HTN HLD Diastolic CHF- cardiologist Dr. Jens Somrenshaw, ECHO 06/26/19 EF 60-65% Recurrent  UTIs Hypothyroidism Anxiety/depression Code status DNR AKI - Cr 1.64 from 1.63  Acute pancreatitis, suspect gallstone pancreatitis Possible cholecystitis Elevated LFTs - ERCP later this morning with GI. Continue to trend LFTs and lipase. Will plan for laparoscopic cholecystectomy after ERCP and once pancreatitis resolves. Continue to hold eliquis. Continue IV antibiotics. Appreciate cardiology input, patient is "intermediate risk and no further cardiac work-up recommended."  ID -cipro/flagyl5/23>> VTE -sq heparin FEN -IVF per TRH, NPO Foley -none Follow up -TBD   LOS: 2 days    Franne FortsBrooke A Yaminah Clayborn, [facility] London[facility] 07/16/2020, 8:36 AM Please see Amion for pager number during day hours 7:00am-4:30pm

## 2020-07-16 NOTE — Anesthesia Postprocedure Evaluation (Signed)
Anesthesia Post Note  Patient: Alyssa Williamson  Procedure(s) Performed: ENDOSCOPIC RETROGRADE CHOLANGIOPANCREATOGRAPHY (ERCP) (N/A ) SPHINCTEROTOMY REMOVAL OF STONES     Patient location during evaluation: PACU Anesthesia Type: General Level of consciousness: sedated Pain management: pain level controlled Vital Signs Assessment: post-procedure vital signs reviewed and stable Respiratory status: spontaneous breathing and respiratory function stable Cardiovascular status: stable Postop Assessment: no apparent nausea or vomiting Anesthetic complications: no   No complications documented.  Last Vitals:  Vitals:   07/16/20 1210 07/16/20 1220  BP: (!) 162/65 (!) 159/61  Pulse: 73 73  Resp: 19 14  Temp:    SpO2: 100% 96%    Last Pain:  Vitals:   07/16/20 1220  TempSrc:   PainSc: 0-No pain                 Jonovan Boedecker DANIEL

## 2020-07-16 NOTE — Interval H&P Note (Signed)
History and Physical Interval Note: 89/female with gallstone pancreatitis and possible small stone noted on MRCP for an ERCP today, last dose of eliquis was on 07/13/20.  07/16/2020 9:50 AM  Marin Shutter  has presented today for ERCP, with the diagnosis of jaundice, suspected choledocholithiasis.  The various methods of treatment have been discussed with the patient and family. After consideration of risks, benefits and other options for treatment, the patient has consented to  Procedure(s): ENDOSCOPIC RETROGRADE CHOLANGIOPANCREATOGRAPHY (ERCP) (N/A) as a surgical intervention.  The patient's history has been reviewed, patient examined, no change in status, stable for surgery.  I have reviewed the patient's chart and labs.  Questions were answered to the patient's satisfaction.     Kerin Salen

## 2020-07-16 NOTE — Transfer of Care (Signed)
Immediate Anesthesia Transfer of Care Note  Patient: Alyssa Williamson  Procedure(s) Performed: ENDOSCOPIC RETROGRADE CHOLANGIOPANCREATOGRAPHY (ERCP) (N/A ) SPHINCTEROTOMY REMOVAL OF STONES  Patient Location: PACU and Endoscopy Unit  Anesthesia Type:General  Level of Consciousness: awake, alert , oriented and patient cooperative  Airway & Oxygen Therapy: Patient Spontanous Breathing and Patient connected to face mask oxygen  Post-op Assessment: Report given to RN, Post -op Vital signs reviewed and stable and Patient moving all extremities  Post vital signs: Reviewed and stable  Last Vitals:  Vitals Value Taken Time  BP 157/69 07/16/20 1144  Temp    Pulse 77 07/16/20 1145  Resp 21 07/16/20 1145  SpO2 94 % 07/16/20 1145  Vitals shown include unvalidated device data.  Last Pain:  Vitals:   07/16/20 0940  TempSrc: Oral  PainSc: 0-No pain      Patients Stated Pain Goal: 2 (07/14/20 2105)  Complications: No complications documented.

## 2020-07-16 NOTE — Progress Notes (Signed)
PROGRESS NOTE    Alyssa Williamson  UXL:[id number] DOB: Dec 12, 1931 DOA: 07/14/2020 PCP: Bradd Canary, MD    Chief Complaint  Patient presents with  . Emesis    Brief Narrative:  87 year old female with past medical history of atrial fibrillation on Eliquis, hypertension, hyperlipidemia, hypothyroidism, chronic diastolic CHFwho presented to [facility] with complaints of nausea, vomiting and right-sided abdominal pain with associated diarrhea for almost 2 weeks. Associated low-grade fever. She was treated with Bactrim for a UTI on 4/18. Saw her PCP on 5/17 when the patient reported her GI symptoms and her PCP initially felt patient had a norovirus infection as her symptoms initially resolved. Unfortunately her symptoms of right flank pain radiating to her lower abdomen with emesis and low-grade fevers recurred,prompting her to go to the ED.  She's been admitted with gallstone pancreatitis.    Assessment & Plan:   Principal Problem:   Cholecystitis Active Problems:   Hypothyroidism   HTN (hypertension)   Hyperlipidemia   Recurrent UTI   Atrial fibrillation (HCC)   Acute on chronic diastolic CHF (congestive heart failure) (HCC)   Acute biliary pancreatitis   AKI (acute kidney injury) (HCC)   Elevated LFTs   Acute lower UTI   Depression   Choledocholithiasis  #1 gallstone pancreatitis/choledocholithiasis/acute cholecystitis -Patient presented with right upper quadrant abdominal pain, nausea vomiting noted to have a transaminitis with elevated bilirubin, lipase elevated (1086) transaminitis with AST and ALT is trending down. -Lipase trending down currently at 639 from 1086 -Abdominal ultrasound done concerning for acute cholecystitis. -MRCP with intra and extrahepatic biliary duct dilatation concerning for calculus at the ampulla, tiny gallstones and distended gallbladder, no imaging evidence of acute pancreatitis or complication, no gallbladder wall thickening or pericholecystic  fluid. -Patient was on ciprofloxacin/Flagyl. -GI following and patient underwent ERCP with filling defect consistent with a stone and sludge seen on cholangiogram, examination suspicious for choledocholithiasis, biliary sphincterotomy was performed, biliary tree swept and sludge found. -Leukocytosis trending down. -Continue clears. -Continue empiric IV ciprofloxacin and Flagyl. -Clinical improvement. -General surgery following and patient likely for inpatient cholecystectomy once acute pancreatitis cools down with continued clinical improvement. -General surgery and GI following and appreciate input and recommendations.  2.  Suspected acute on chronic diastolic CHF -Patient felt to be volume overloaded on examination noted to have some lower extremity edema -2D echo done with EF 55 to 60%, grade 2 diastolic dysfunction -IV fluids have been saline locked. -Lasix on hold and likely resume over the next 1 to 2 days. -Urology following.  3.  Acute kidney injury -Creatinine trending up but seems to be plateauing. -Renal ultrasound negative for hydronephrosis. -IV fluids on hold due to concerns for volume overload. -Lasix on hold. -Check urine sodium, urine creatinine. -Follow.  4.  Chronic atrial fibrillation -Continue verapamil for rate control. -Eliquis on hold in anticipation of cholecystectomy.  5.  Hypertension -Continue verapamil.  6.  Hyperlipidemia -Statin on hold secondary to transaminitis. -Likely resume in the outpatient setting once transaminitis has resolved.  7.  Hypothyroidism -Synthroid.  8.  Asymptomatic UTI -Urine cultures pending. -On IV ciprofloxacin secondary to problem #1.  9.  Depression/anxiety -Celexa on hold to decrease pill burden for now. -Xanax as needed.    DVT prophylaxis: Heparin Code Status: DNR Family Communication:  Disposition:   Status is: Inpatient    Dispo: The patient is from: Home              Anticipated d/c is to: Home  Patient currently with acute pancreatitis, status post ERCP with choledocholithiasis, needing inpatient cholecystectomy.  Not stable for discharge.   Difficult to place patient no       Consultants:   Cardiology: Dr. Bjorn Pippin 07/15/2020  Gastroenterology: Dr. Matthias Hughs 07/14/2020  General surgery: Dr. Daphine Deutscher 07/15/2020  Antimicrobials:   IV ciprofloxacin 07/14/2020>>>>  IV Flagyl 07/14/2020>>>>   Procedures:   Chest x-ray 07/15/2020, 07/16/2020  MRCP 07/15/2020  Renal ultrasound 07/15/2020  Abdominal ultrasound 07/14/2020  ERCP per Dr. Marca Ancona 07/16/2020   Subjective: Sitting up in bed just returned from ERCP.  Denies any abdominal pain.  No nausea or vomiting.  Daughter at bedside.  Per RN patient with audible wheezing this morning that is improved significantly since ERCP.  Objective: Vitals:   07/16/20 1150 07/16/20 1200 07/16/20 1210 07/16/20 1220  BP: (!) 145/64 (!) 161/58 (!) 162/65 (!) 159/61  Pulse: 77 73 73 73  Resp: Temp:      TempSrc:      SpO2: 99% 100% 100% 96%  Weight:      Height:        Intake/Output Summary (Last 24 hours) at 07/16/2020 2031 Last data filed at 07/16/2020 1539 Gross per 24 hour  Intake 877.04 ml  Output --  Net 877.04 ml   Filed Weights   07/15/20 0500 07/16/20 0500 07/16/20 0940  Weight: 87.8 kg 92.1 kg 83.9 kg    Examination:  General exam: Appears calm and comfortable  Respiratory system: Minimal expiratory wheezing.  Fair air movement.  Speaking in full sentences.  Cardiovascular system: S1 & S2 heard, RRR. No JVD, murmurs, rubs, gallops or clicks. No pedal edema. Gastrointestinal system: Abdomen is nondistended, soft and nontender. No organomegaly or masses felt. Normal bowel sounds heard. Central nervous system: Alert and oriented. No focal neurological deficits. Extremities: Symmetric 5 x 5 power. Skin: No rashes, lesions or ulcers Psychiatry: Judgement and insight appear normal. Mood & affect  appropriate.     Data Reviewed: I have personally reviewed following labs and imaging studies  CBC: Recent Labs  Lab 07/14/20 0837 07/15/20 0407 07/16/20 0412  WBC 12.9* 20.2* 14.8*  NEUTROABS  --   --  12.4*  HGB 13.6 12.6 12.1  HCT 39.4 37.9 37.0  MCV 95.4 99.7 101.4*  PLT 201 195 226    Basic Metabolic Panel: Recent Labs  Lab 07/14/20 0837 07/15/20 0407 07/15/20 1750 07/16/20 0412  NA 136 135 133* 134*  K 3.7 4.0 4.2 4.6  CL 100 101 97* 99  CO2 GLUCOSE 108* 91 101* 82  BUN 17 15 26* 26*  CREATININE 0.89 1.37* 1.63* 1.64*  CALCIUM 8.4* 8.2* 8.1* 8.3*  MG  --   --   --  2.0  PHOS  --   --   --  3.9    GFR: Estimated Creatinine Clearance: 24.3 mL/min (A) (by C-G formula based on SCr of 1.64 mg/dL (H)).  Liver Function Tests: Recent Labs  Lab 07/14/20 0837 07/15/20 0407 07/16/20 0412  AST 636* 569* 301*  ALT 383* 578* 394*  ALKPHOS 119 133* 141*  BILITOT 1.9* 6.0* 3.2*  PROT 6.7 5.9* 6.0*  ALBUMIN 3.2* 2.9* 3.1*    CBG: No results for input(s): GLUCAP in the last 168 hours.   Recent Results (from the past 240 hour(s))  Resp Panel by RT-PCR (Flu A&B, Covid) Nasopharyngeal Swab     Status: None   Collection Time: 07/14/20 10:39 AM  Specimen: Nasopharyngeal Swab; Nasopharyngeal(NP) swabs in vial transport medium  Result Value Ref Range Status   SARS Coronavirus 2 by RT PCR NEGATIVE NEGATIVE Final    Comment: (NOTE) SARS-CoV-2 target nucleic acids are NOT DETECTED.  The SARS-CoV-2 RNA is generally detectable in upper respiratory specimens during the acute phase of infection. The lowest concentration of SARS-CoV-2 viral copies this assay can detect is 138 copies/mL. A negative result does not preclude SARS-Cov-2 infection and should not be used as the sole basis for treatment or other patient management decisions. A negative result may occur with  improper specimen collection/handling, submission of specimen other than nasopharyngeal  swab, presence of viral mutation(s) within the areas targeted by this assay, and inadequate number of viral copies(<138 copies/mL). A negative result must be combined with clinical observations, patient history, and epidemiological information. The expected result is Negative.  Fact Sheet for Patients:  BloggerCourse.com  Fact Sheet for Healthcare Providers:  SeriousBroker.it  This test is no t yet approved or cleared by the [place] FDA and  has been authorized for detection and/or diagnosis of SARS-CoV-2 by FDA under an Emergency Use Authorization (EUA). This EUA will remain  in effect (meaning this test can be used) for the duration of the COVID-19 declaration under Section 564(b)(1) of the Act, 21 U.S.C.section 360bbb-3(b)(1), unless the authorization is terminated  or revoked sooner.       Influenza A by PCR NEGATIVE NEGATIVE Final   Influenza B by PCR NEGATIVE NEGATIVE Final    Comment: (NOTE) The Xpert Xpress SARS-CoV-2/FLU/RSV plus assay is intended as an aid in the diagnosis of influenza from Nasopharyngeal swab specimens and should not be used as a sole basis for treatment. Nasal washings and aspirates are unacceptable for Xpert Xpress SARS-CoV-2/FLU/RSV testing.  Fact Sheet for Patients: BloggerCourse.com  Fact Sheet for Healthcare Providers: SeriousBroker.it  This test is not yet approved or cleared by the [place] FDA and has been authorized for detection and/or diagnosis of SARS-CoV-2 by FDA under an Emergency Use Authorization (EUA). This EUA will remain in effect (meaning this test can be used) for the duration of the COVID-19 declaration under Section 564(b)(1) of the Act, 21 U.S.C. section 360bbb-3(b)(1), unless the authorization is terminated or revoked.  Performed at [facility], [address], [place], [place] [address]           Radiology Studies: US RENAL  Result Date: 07/15/2020 CLINICAL DATA:  87 year old female with acute renal insufficiency. EXAM: RENAL / URINARY TRACT ULTRASOUND COMPLETE COMPARISON:  Abdominal MRI dated 07/15/2020 FINDINGS: Right Kidney: Renal measurements: 6.7 x 3.7 x 3.9 cm = volume: 51 mL. There is moderate parenchyma atrophy and cortical thinning. Normal echogenicity. No hydronephrosis or shadowing stone. Left Kidney: Renal measurements: 9.7 x 4.5 x 5.0 cm = volume: 150 mL. Mild parenchyma atrophy. Normal echogenicity. No hydronephrosis or shadowing stone. Bladder: Appears normal for degree of bladder distention. Other: None. IMPRESSION: Moderate right and mild left renal parenchyma atrophy. No hydronephrosis or shadowing stone. Electronically Signed   By: Elgie Collard M.D.   On: 07/15/2020 20:35   MR 3D Recon At Scanner  Result Date: 07/15/2020 CLINICAL DATA:  Jaundice, acute pancreatitis, concern for gallstone pancreatitis, abnormal LFTs, suspected choledocholithiasis EXAM: MRI ABDOMEN WITHOUT AND WITH CONTRAST (INCLUDING MRCP) TECHNIQUE: Multiplanar multisequence MR imaging of the abdomen was performed both before and after the administration of intravenous contrast. Heavily T2-weighted images of the biliary and pancreatic ducts were obtained, and three-dimensional MRCP images  were rendered by post processing. CONTRAST:  32mL GADAVIST GADOBUTROL 1 MMOL/ML IV SOLN COMPARISON:  Right upper quadrant ultrasound, 07/14/2020 FINDINGS: Examination is generally somewhat limited by body habitus and breath motion artifact throughout. Lower chest: No acute findings. Hepatobiliary: No mass or other parenchymal abnormality identified. Tiny gallstones in the distended gallbladder (series 11, image 19). Intra and extrahepatic biliary ductal dilatation, common bile duct measuring up to 1.0 cm. Although not definitive, appearance of the distal common bile duct is suspicious for the presence of a tiny  calculus at the ampulla (series 10, image 11). Pancreas: No mass, inflammatory changes, or other parenchymal abnormality identified. Spleen:  Within normal limits in size and appearance. Adrenals/Urinary Tract: No masses identified. No evidence of hydronephrosis. Stomach/Bowel: Visualized portions within the abdomen are unremarkable. Vascular/Lymphatic: No pathologically enlarged lymph nodes identified. No abdominal aortic aneurysm demonstrated. Other:  None. Musculoskeletal: No suspicious bone lesions identified. IMPRESSION: 1. Intra and extrahepatic biliary ductal dilatation, common bile duct measuring up to 1.0 cm. Although not definitive, appearance of the distal common bile duct is suspicious for the presence of a tiny calculus at the ampulla. 2. Tiny gallstones in the distended gallbladder. No gallbladder wall thickening or pericholecystic fluid. 3. No imaging evidence of acute pancreatitis or complication such as acute pancreatic fluid collection. Electronically Signed   By: Lauralyn Primes M.D.   On: 07/15/2020 12:35   DG CHEST PORT 1 VIEW  Result Date: 07/16/2020 CLINICAL DATA:  Shortness of breath. EXAM: PORTABLE CHEST 1 VIEW COMPARISON:  Chest x-ray 07/15/20 FINDINGS: The heart size and mediastinal contours are unchanged. Aortic calcifications. No focal consolidation. No pulmonary edema. No pleural effusion. No pneumothorax. No acute osseous abnormality. IMPRESSION: No active disease. Electronically Signed   By: Tish Frederickson M.D.   On: 07/16/2020 05:27   DG CHEST PORT 1 VIEW  Result Date: 07/15/2020 CLINICAL DATA:  Abdominal pain for 1 day, RIGHT upper quadrant pain with associated with nausea and vomiting, shaking chills, question choledocholithiasis EXAM: PORTABLE CHEST 1 VIEW COMPARISON:  Portable exam 0810 hours compared to 04/25/2020 FINDINGS: Minimal enlargement of cardiac silhouette. Mediastinal contours and pulmonary vascularity normal. Atherosclerotic calcification of descending thoracic  aorta. Lungs clear. No pulmonary infiltrate, pleural effusion, or pneumothorax. Bones demineralized. IMPRESSION: No acute abnormalities. Aortic Atherosclerosis (ICD10-I70.0). Electronically Signed   By: Ulyses Southward M.D.   On: 07/15/2020 08:33   DG ERCP BILIARY & PANCREATIC DUCTS  Result Date: 07/16/2020 CLINICAL DATA:  Probable choledocholithiasis EXAM: ERCP TECHNIQUE: Multiple spot images obtained with the fluoroscopic device and submitted for interpretation post-procedure. FLUOROSCOPY TIME:  Fluoroscopy Time:  2 minutes 49 seconds Radiation Exposure Index (if provided by the fluoroscopic device): 49.8 Number of Acquired Spot Images: 0 COMPARISON:  MRCP 07/15/2020 FINDINGS: Nine intraoperative saved images demonstrate a flexible endoscope in the descending duodenum followed by wire cannulation of the intrahepatic ducts and cholangiogram. Mild biliary ductal dilatation. Subsequent images demonstrate sphincterotomy and balloon sweeping of the common duct. IMPRESSION: ERCP with sphincterotomy and balloon sweeping of the common duct. These images were submitted for radiologic interpretation only. Please see the procedural report for the amount of contrast and the fluoroscopy time utilized. Electronically Signed   By: Malachy Moan M.D.   On: 07/16/2020 11:49   MR ABDOMEN MRCP W WO CONTAST  Result Date: 07/15/2020 CLINICAL DATA:  Jaundice, acute pancreatitis, concern for gallstone pancreatitis, abnormal LFTs, suspected choledocholithiasis EXAM: MRI ABDOMEN WITHOUT AND WITH CONTRAST (INCLUDING MRCP) TECHNIQUE: Multiplanar multisequence MR imaging of the abdomen was performed  both before and after the administration of intravenous contrast. Heavily T2-weighted images of the biliary and pancreatic ducts were obtained, and three-dimensional MRCP images were rendered by post processing. CONTRAST:  37mL GADAVIST GADOBUTROL 1 MMOL/ML IV SOLN COMPARISON:  Right upper quadrant ultrasound, 07/14/2020 FINDINGS:  Examination is generally somewhat limited by body habitus and breath motion artifact throughout. Lower chest: No acute findings. Hepatobiliary: No mass or other parenchymal abnormality identified. Tiny gallstones in the distended gallbladder (series 11, image 19). Intra and extrahepatic biliary ductal dilatation, common bile duct measuring up to 1.0 cm. Although not definitive, appearance of the distal common bile duct is suspicious for the presence of a tiny calculus at the ampulla (series 10, image 11). Pancreas: No mass, inflammatory changes, or other parenchymal abnormality identified. Spleen:  Within normal limits in size and appearance. Adrenals/Urinary Tract: No masses identified. No evidence of hydronephrosis. Stomach/Bowel: Visualized portions within the abdomen are unremarkable. Vascular/Lymphatic: No pathologically enlarged lymph nodes identified. No abdominal aortic aneurysm demonstrated. Other:  None. Musculoskeletal: No suspicious bone lesions identified. IMPRESSION: 1. Intra and extrahepatic biliary ductal dilatation, common bile duct measuring up to 1.0 cm. Although not definitive, appearance of the distal common bile duct is suspicious for the presence of a tiny calculus at the ampulla. 2. Tiny gallstones in the distended gallbladder. No gallbladder wall thickening or pericholecystic fluid. 3. No imaging evidence of acute pancreatitis or complication such as acute pancreatic fluid collection. Electronically Signed   By: Lauralyn Primes M.D.   On: 07/15/2020 12:35   ECHOCARDIOGRAM COMPLETE  Result Date: 07/15/2020    ECHOCARDIOGRAM REPORT   Patient Name:   Alyssa Williamson Date of Exam: 07/15/2020 Medical Rec #:  [id number]     Height:       62.0 in Accession #:    [id number]    Weight:       193.6 lb Date of Birth:  [birth year]     BSA:          1.885 m Patient Age:    89 years      BP:           128/66 mmHg Patient Gender: F             HR:           69 bpm. Exam Location:  Inpatient Procedure: 2D Echo,  Cardiac Doppler and Color Doppler Indications:    CHF  History:        Patient has prior history of Echocardiogram examinations, most                 recent 06/26/2019. Arrythmias:Atrial Fibrillation,                 Signs/Symptoms:Murmur; Risk Factors:Hypertension and                 Dyslipidemia.  Sonographer:    Neomia Dear RDCS Referring Phys: [id number] Jae Dire  Sonographer Comments: Patient is morbidly obese. Image acquisition challenging due to patient body habitus. IMPRESSIONS  1. Left ventricular ejection fraction, by estimation, is 55 to 60%. The left ventricle has normal function. The left ventricle has no regional wall motion abnormalities. Left ventricular diastolic parameters are consistent with Grade II diastolic dysfunction (pseudonormalization).  2. Right ventricular systolic function is normal. The right ventricular size is mildly enlarged. There is mildly elevated pulmonary artery systolic pressure. The estimated right ventricular systolic pressure is 37.1 mmHg.  3. Right atrial size was mildly dilated.  4.  The mitral valve is normal in structure. Mild mitral valve regurgitation. No evidence of mitral stenosis.  5. The aortic valve is normal in structure. There is mild calcification of the aortic valve. There is mild thickening of the aortic valve. Aortic valve regurgitation is not visualized. Mild aortic valve sclerosis is present, with no evidence of aortic valve stenosis.  6. The inferior vena cava is normal in size with greater than 50% respiratory variability, suggesting right atrial pressure of 3 mmHg. FINDINGS  Left Ventricle: Left ventricular ejection fraction, by estimation, is 55 to 60%. The left ventricle has normal function. The left ventricle has no regional wall motion abnormalities. The left ventricular internal cavity size was normal in size. There is  no left ventricular hypertrophy. Left ventricular diastolic parameters are consistent with Grade II diastolic dysfunction  (pseudonormalization). Right Ventricle: The right ventricular size is mildly enlarged. No increase in right ventricular wall thickness. Right ventricular systolic function is normal. There is mildly elevated pulmonary artery systolic pressure. The tricuspid regurgitant velocity is 2.92 m/s, and with an assumed right atrial pressure of 3 mmHg, the estimated right ventricular systolic pressure is 37.1 mmHg. Left Atrium: Left atrial size was normal in size. Right Atrium: Right atrial size was mildly dilated. Pericardium: There is no evidence of pericardial effusion. Mitral Valve: The mitral valve is normal in structure. Mild mitral annular calcification. Mild mitral valve regurgitation. No evidence of mitral valve stenosis. MV peak gradient, 8.0 mmHg. The mean mitral valve gradient is 3.0 mmHg. Tricuspid Valve: The tricuspid valve is normal in structure. Tricuspid valve regurgitation is trivial. No evidence of tricuspid stenosis. Aortic Valve: The aortic valve is normal in structure. There is mild calcification of the aortic valve. There is mild thickening of the aortic valve. Aortic valve regurgitation is not visualized. Mild aortic valve sclerosis is present, with no evidence of aortic valve stenosis. Aortic valve mean gradient measures 6.0 mmHg. Aortic valve peak gradient measures 10.2 mmHg. Aortic valve area, by VTI measures 1.93 cm. Pulmonic Valve: The pulmonic valve was normal in structure. Pulmonic valve regurgitation is not visualized. No evidence of pulmonic stenosis. Aorta: The aortic root is normal in size and structure. Venous: The inferior vena cava is normal in size with greater than 50% respiratory variability, suggesting right atrial pressure of 3 mmHg. IAS/Shunts: No atrial level shunt detected by color flow Doppler.  LEFT VENTRICLE PLAX 2D LVIDd:         5.20 cm     Diastology LVIDs:         2.60 cm     LV e' medial:    5.18 cm/s LV PW:         1.00 cm     LV E/e' medial:  22.4 LV IVS:        1.00 cm      LV e' lateral:   5.70 cm/s LVOT diam:     1.80 cm     LV E/e' lateral: 20.4 LV SV:         67 LV SV Index:   36 LVOT Area:     2.54 cm  LV Volumes (MOD) LV vol d, MOD A2C: 51.1 ml LV vol d, MOD A4C: 41.6 ml LV vol s, MOD A2C: 23.2 ml LV vol s, MOD A4C: 24.3 ml LV SV MOD A2C:     27.9 ml LV SV MOD A4C:     41.6 ml LV SV MOD BP:      21.0 ml RIGHT VENTRICLE  RV Basal diam:  4.20 cm RV Mid diam:    2.50 cm RV S prime:     13.10 cm/s TAPSE (M-mode): 2.6 cm LEFT ATRIUM             Index       RIGHT ATRIUM           Index LA diam:        4.10 cm 2.17 cm/m  RA Area:     15.10 cm LA Vol (A2C):   54.3 ml 28.80 ml/m RA Volume:   37.40 ml  19.84 ml/m LA Vol (A4C):   61.4 ml 32.57 ml/m LA Biplane Vol: 61.9 ml 32.83 ml/m  AORTIC VALVE                    PULMONIC VALVE AV Area (Vmax):    1.65 cm     PV Vmax:       0.81 m/s AV Area (Vmean):   1.80 cm     PV Vmean:      65.200 cm/s AV Area (VTI):     1.93 cm     PV VTI:        0.234 m AV Vmax:           160.00 cm/s  PV Peak grad:  2.6 mmHg AV Vmean:          109.000 cm/s PV Mean grad:  2.0 mmHg AV VTI:            0.350 m AV Peak Grad:      10.2 mmHg AV Mean Grad:      6.0 mmHg LVOT Vmax:         104.00 cm/s LVOT Vmean:        77.100 cm/s LVOT VTI:          0.265 m LVOT/AV VTI ratio: 0.76  AORTA Ao Root diam: 2.70 cm Ao Asc diam:  2.60 cm MITRAL VALVE                TRICUSPID VALVE MV Area (PHT): 5.62 cm     TR Peak grad:   34.1 mmHg MV Area VTI:   1.80 cm     TR Vmax:        292.00 cm/s MV Peak grad:  8.0 mmHg MV Mean grad:  3.0 mmHg     SHUNTS MV Vmax:       1.41 m/s     Systemic VTI:  0.26 m MV Vmean:      70.1 cm/s    Systemic Diam: 1.80 cm MV Decel Time: 135 msec MR Peak grad: 72.9 mmHg MR Mean grad: 53.0 mmHg MR Vmax:      427.00 cm/s MR Vmean:     352.0 cm/s MV E velocity: 116.00 cm/s MV A velocity: 45.90 cm/s MV E/A ratio:  2.53 Donato SchultzMark Skains MD Electronically signed by Donato SchultzMark Skains MD Signature Date/Time: 07/15/2020/2:21:55 PM    Final         Scheduled  Meds: . fluticasone furoate-vilanterol  1 puff Inhalation Daily  . heparin  5,000 Units Subcutaneous Q8H  . indomethacin  100 mg Rectal Once  . levothyroxine  75 mcg Oral QAC breakfast  . verapamil  240 mg Oral Daily   Continuous Infusions: . ciprofloxacin 200 mL/hr at 07/16/20 0906   And  . metronidazole Stopped (07/16/20 1429)     LOS: 2 days    Time spent: 35 minutes    Ramiro Harvestaniel Marybell Robards, MD  [facility]   To contact the attending provider between 7A-7P or the covering provider during after hours 7P-7A, please log into the web site [email or web address] and access using universal [place] password for that web site. If you do not have the password, please call the hospital operator.  07/16/2020, 8:31 PM

## 2020-07-17 ENCOUNTER — Inpatient Hospital Stay (HOSPITAL_COMMUNITY): Payer: Medicare Other

## 2020-07-17 DIAGNOSIS — K851 Biliary acute pancreatitis without necrosis or infection: Secondary | ICD-10-CM | POA: Diagnosis not present

## 2020-07-17 DIAGNOSIS — I5033 Acute on chronic diastolic (congestive) heart failure: Secondary | ICD-10-CM | POA: Diagnosis not present

## 2020-07-17 DIAGNOSIS — K819 Cholecystitis, unspecified: Secondary | ICD-10-CM | POA: Diagnosis not present

## 2020-07-17 DIAGNOSIS — N179 Acute kidney failure, unspecified: Secondary | ICD-10-CM | POA: Diagnosis not present

## 2020-07-17 LAB — COMPREHENSIVE METABOLIC PANEL
ALT: 268 U/L — ABNORMAL HIGH (ref 0–44)
AST: 149 U/L — ABNORMAL HIGH (ref 15–41)
Albumin: 2.8 g/dL — ABNORMAL LOW (ref 3.5–5.0)
Alkaline Phosphatase: 128 U/L — ABNORMAL HIGH (ref 38–126)
Anion gap: 5 (ref 5–15)
BUN: 24 mg/dL — ABNORMAL HIGH (ref 8–23)
CO2: 28 mmol/L (ref 22–32)
Calcium: 8.4 mg/dL — ABNORMAL LOW (ref 8.9–10.3)
Chloride: 100 mmol/L (ref 98–111)
Creatinine, Ser: 1.08 mg/dL — ABNORMAL HIGH (ref 0.44–1.00)
GFR, Estimated: 50 mL/min — ABNORMAL LOW (ref >60)
Glucose, Bld: 141 mg/dL — ABNORMAL HIGH (ref 70–99)
Potassium: 4.5 mmol/L (ref 3.5–5.1)
Sodium: 133 mmol/L — ABNORMAL LOW (ref 135–145)
Total Bilirubin: 1.6 mg/dL — ABNORMAL HIGH (ref 0.3–1.2)
Total Protein: 5.9 g/dL — ABNORMAL LOW (ref 6.5–8.1)

## 2020-07-17 LAB — LIPASE, BLOOD: Lipase: 147 U/L — ABNORMAL HIGH (ref 11–51)

## 2020-07-17 LAB — CBC
HCT: 32.9 % — ABNORMAL LOW (ref 36.0–46.0)
Hemoglobin: 11.1 g/dL — ABNORMAL LOW (ref 12.0–15.0)
MCH: 32.9 pg (ref 26.0–34.0)
MCHC: 33.7 g/dL (ref 30.0–36.0)
MCV: 97.6 fL (ref 80.0–100.0)
Platelets: 162 10*3/uL (ref 150–400)
RBC: 3.37 MIL/uL — ABNORMAL LOW (ref 3.87–5.11)
RDW: 13.7 % (ref 11.5–15.5)
WBC: 7 10*3/uL (ref 4.0–10.5)
nRBC: 0 % (ref 0.0–0.2)

## 2020-07-17 LAB — URINE CULTURE: Culture: NO GROWTH

## 2020-07-17 LAB — UREA NITROGEN, URINE: Urea Nitrogen, Ur: 469 mg/dL

## 2020-07-17 LAB — BRAIN NATRIURETIC PEPTIDE: B Natriuretic Peptide: 673.7 pg/mL — ABNORMAL HIGH (ref 0.0–100.0)

## 2020-07-17 LAB — SODIUM, URINE, RANDOM: Sodium, Ur: 11 mmol/L

## 2020-07-17 LAB — CREATININE, URINE, RANDOM: Creatinine, Urine: 79.41 mg/dL

## 2020-07-17 MED ORDER — CIPROFLOXACIN IN D5W 400 MG/200ML IV SOLN
400.0000 mg | Freq: Two times a day (BID) | INTRAVENOUS | Status: AC
Start: 2020-07-17 — End: 2020-07-19
  Administered 2020-07-17 – 2020-07-19 (×6): 400 mg via INTRAVENOUS
  Filled 2020-07-17 (×6): qty 200

## 2020-07-17 MED ORDER — FAMOTIDINE 20 MG PO TABS: 20.0000 mg | ORAL_TABLET | Freq: Every day | ORAL | Status: DC | PRN

## 2020-07-17 MED ORDER — BOOST / RESOURCE BREEZE PO LIQD CUSTOM
1.0000 | Freq: Two times a day (BID) | ORAL | Status: DC
  Administered 2020-07-17 – 2020-07-20 (×5): 1 via ORAL

## 2020-07-17 MED ORDER — FUROSEMIDE 10 MG/ML IJ SOLN: 20.0000 mg | Freq: Once | INTRAMUSCULAR | Status: DC

## 2020-07-17 MED ORDER — FUROSEMIDE 10 MG/ML IJ SOLN
20.0000 mg | Freq: Once | INTRAMUSCULAR | Status: DC
  Filled 2020-07-17: qty 2

## 2020-07-17 MED ORDER — FUROSEMIDE 10 MG/ML IJ SOLN
20.0000 mg | Freq: Two times a day (BID) | INTRAMUSCULAR | Status: DC
  Administered 2020-07-17 – 2020-07-19 (×5): 20 mg via INTRAVENOUS
  Filled 2020-07-17 (×4): qty 2

## 2020-07-17 MED ORDER — SODIUM CHLORIDE 0.9% FLUSH
10.0000 mL | Freq: Two times a day (BID) | INTRAVENOUS | Status: DC
  Administered 2020-07-17 – 2020-07-19 (×3): 10 mL

## 2020-07-17 MED ORDER — SODIUM CHLORIDE 0.9% FLUSH: 10.0000 mL | INTRAVENOUS | Status: DC | PRN

## 2020-07-17 MED ORDER — FAMOTIDINE 20 MG PO TABS
20.0000 mg | ORAL_TABLET | Freq: Once | ORAL | Status: AC
  Administered 2020-07-17: 20 mg via ORAL
  Filled 2020-07-17: qty 1

## 2020-07-17 MED ORDER — METRONIDAZOLE 500 MG/100ML IV SOLN
500.0000 mg | Freq: Three times a day (TID) | INTRAVENOUS | Status: AC
  Administered 2020-07-17 – 2020-07-19 (×8): 500 mg via INTRAVENOUS
  Filled 2020-07-17 (×8): qty 100

## 2020-07-17 NOTE — Progress Notes (Signed)
PHARMACY NOTE -  ANTIBIOTIC RENAL DOSE ADJUSTMENT  Patient has been initiated on Cipro for IAI, r/o UTI SCr 1.08, estimated CrCl 37 ml/min Plan: increase Cipro to 400 mg IV q12  Herby Abraham, Pharm.D 07/17/2020 7:59 AM

## 2020-07-17 NOTE — Progress Notes (Signed)
[facility] Progress Note  1 Day Post-Op  Subjective: CC-  Continues to feel better each day. She reports very mild RUQ soreness. Denies n/v. Tolerated clear liquids last night. States that she does still have some wheezing with mobilization. Denies CP or SOB. Increased to 4L O2 over night, down to 2L this morning.  Objective: Vital signs in last 24 hours: Temp:  [97.9 F (36.6 C)-98.6 F (37 C)] 98 F (36.7 C) (05/26 0429) Pulse Rate:  [71-77] 71 (05/26 0429) Resp:  [14-19] 18 (05/26 0429) BP: (140-162)/(49-70) 142/70 (05/26 0429) SpO2:  [96 %-100 %] 96 % (05/26 0429) Weight:  [83.9 kg] 83.9 kg (05/25 0940) Last BM Date:  (PTA)  Intake/Output from previous day: 05/25 0701 - 05/26 0700 In: 877 [I.V.:325.1; IV Piggyback:551.9] Out: -  Intake/Output this shift: No intake/output data recorded.  PE: Gen: Alert, NAD, pleasant Pulm: mild expiratory wheezing bilaterally, rate and effort normal on 2L supplemental O2 via [employer] WUJ:WJXB,JYNW distension,very mild epigastric/RUQ TTP without rebound or guarding, no masses, hernias, or organomegaly Skin: no rashes noted, warm and dry   Lab Results:  Recent Labs    07/16/20 0412 07/17/20 0404  WBC 14.8* 7.0  HGB 12.1 11.1*  HCT 37.0 32.9*  PLT 226 162   BMET Recent Labs    07/16/20 0412 07/17/20 0404  NA 134* 133*  K 4.6 4.5  CL 99 100  CO2 28 28  GLUCOSE 82 141*  BUN 26* 24*  CREATININE 1.64* 1.08*  CALCIUM 8.3* 8.4*   PT/INR No results for input(s): LABPROT, INR in the last 72 hours. CMP     Component Value Date/Time   NA 133 (L) 07/17/2020 0404   NA 139 06/26/2019 1459   K 4.5 07/17/2020 0404   CL 100 07/17/2020 0404   CO2 28 07/17/2020 0404   GLUCOSE 141 (H) 07/17/2020 0404   BUN 24 (H) 07/17/2020 0404   BUN 20 06/26/2019 1459   CREATININE 1.08 (H) 07/17/2020 0404   CREATININE 1.25 (H) 12/25/2019 1208   CALCIUM 8.4 (L) 07/17/2020 0404   PROT 5.9 (L) 07/17/2020 0404   ALBUMIN 2.8 (L)  07/17/2020 0404   AST 149 (H) 07/17/2020 0404   ALT 268 (H) 07/17/2020 0404   ALKPHOS 128 (H) 07/17/2020 0404   BILITOT 1.6 (H) 07/17/2020 0404   GFRNONAA 50 (L) 07/17/2020 0404   GFRAA 47 (L) 06/26/2019 1459   Lipase     Component Value Date/Time   LIPASE 147 (H) 07/17/2020 0404       Studies/Results: US RENAL  Result Date: 07/15/2020 CLINICAL DATA:  87 year old female with acute renal insufficiency. EXAM: RENAL / URINARY TRACT ULTRASOUND COMPLETE COMPARISON:  Abdominal MRI dated 07/15/2020 FINDINGS: Right Kidney: Renal measurements: 6.7 x 3.7 x 3.9 cm = volume: 51 mL. There is moderate parenchyma atrophy and cortical thinning. Normal echogenicity. No hydronephrosis or shadowing stone. Left Kidney: Renal measurements: 9.7 x 4.5 x 5.0 cm = volume: 150 mL. Mild parenchyma atrophy. Normal echogenicity. No hydronephrosis or shadowing stone. Bladder: Appears normal for degree of bladder distention. Other: None. IMPRESSION: Moderate right and mild left renal parenchyma atrophy. No hydronephrosis or shadowing stone. Electronically Signed   By: Elgie Collard M.D.   On: 07/15/2020 20:35   MR 3D Recon At Scanner  Result Date: 07/15/2020 CLINICAL DATA:  Jaundice, acute pancreatitis, concern for gallstone pancreatitis, abnormal LFTs, suspected choledocholithiasis EXAM: MRI ABDOMEN WITHOUT AND WITH CONTRAST (INCLUDING MRCP) TECHNIQUE: Multiplanar multisequence MR imaging of the abdomen was performed  both before and after the administration of intravenous contrast. Heavily T2-weighted images of the biliary and pancreatic ducts were obtained, and three-dimensional MRCP images were rendered by post processing. CONTRAST:  36mL GADAVIST GADOBUTROL 1 MMOL/ML IV SOLN COMPARISON:  Right upper quadrant ultrasound, 07/14/2020 FINDINGS: Examination is generally somewhat limited by body habitus and breath motion artifact throughout. Lower chest: No acute findings. Hepatobiliary: No mass or other parenchymal  abnormality identified. Tiny gallstones in the distended gallbladder (series 11, image 19). Intra and extrahepatic biliary ductal dilatation, common bile duct measuring up to 1.0 cm. Although not definitive, appearance of the distal common bile duct is suspicious for the presence of a tiny calculus at the ampulla (series 10, image 11). Pancreas: No mass, inflammatory changes, or other parenchymal abnormality identified. Spleen:  Within normal limits in size and appearance. Adrenals/Urinary Tract: No masses identified. No evidence of hydronephrosis. Stomach/Bowel: Visualized portions within the abdomen are unremarkable. Vascular/Lymphatic: No pathologically enlarged lymph nodes identified. No abdominal aortic aneurysm demonstrated. Other:  None. Musculoskeletal: No suspicious bone lesions identified. IMPRESSION: 1. Intra and extrahepatic biliary ductal dilatation, common bile duct measuring up to 1.0 cm. Although not definitive, appearance of the distal common bile duct is suspicious for the presence of a tiny calculus at the ampulla. 2. Tiny gallstones in the distended gallbladder. No gallbladder wall thickening or pericholecystic fluid. 3. No imaging evidence of acute pancreatitis or complication such as acute pancreatic fluid collection. Electronically Signed   By: Lauralyn Primes M.D.   On: 07/15/2020 12:35   DG CHEST PORT 1 VIEW  Result Date: 07/16/2020 CLINICAL DATA:  Shortness of breath. EXAM: PORTABLE CHEST 1 VIEW COMPARISON:  Chest x-ray 07/15/20 FINDINGS: The heart size and mediastinal contours are unchanged. Aortic calcifications. No focal consolidation. No pulmonary edema. No pleural effusion. No pneumothorax. No acute osseous abnormality. IMPRESSION: No active disease. Electronically Signed   By: Tish Frederickson M.D.   On: 07/16/2020 05:27   DG ERCP BILIARY & PANCREATIC DUCTS  Result Date: 07/16/2020 CLINICAL DATA:  Probable choledocholithiasis EXAM: ERCP TECHNIQUE: Multiple spot images obtained with  the fluoroscopic device and submitted for interpretation post-procedure. FLUOROSCOPY TIME:  Fluoroscopy Time:  2 minutes 49 seconds Radiation Exposure Index (if provided by the fluoroscopic device): 49.8 Number of Acquired Spot Images: 0 COMPARISON:  MRCP 07/15/2020 FINDINGS: Nine intraoperative saved images demonstrate a flexible endoscope in the descending duodenum followed by wire cannulation of the intrahepatic ducts and cholangiogram. Mild biliary ductal dilatation. Subsequent images demonstrate sphincterotomy and balloon sweeping of the common duct. IMPRESSION: ERCP with sphincterotomy and balloon sweeping of the common duct. These images were submitted for radiologic interpretation only. Please see the procedural report for the amount of contrast and the fluoroscopy time utilized. Electronically Signed   By: Malachy Moan M.D.   On: 07/16/2020 11:49   MR ABDOMEN MRCP W WO CONTAST  Result Date: 07/15/2020 CLINICAL DATA:  Jaundice, acute pancreatitis, concern for gallstone pancreatitis, abnormal LFTs, suspected choledocholithiasis EXAM: MRI ABDOMEN WITHOUT AND WITH CONTRAST (INCLUDING MRCP) TECHNIQUE: Multiplanar multisequence MR imaging of the abdomen was performed both before and after the administration of intravenous contrast. Heavily T2-weighted images of the biliary and pancreatic ducts were obtained, and three-dimensional MRCP images were rendered by post processing. CONTRAST:  68mL GADAVIST GADOBUTROL 1 MMOL/ML IV SOLN COMPARISON:  Right upper quadrant ultrasound, 07/14/2020 FINDINGS: Examination is generally somewhat limited by body habitus and breath motion artifact throughout. Lower chest: No acute findings. Hepatobiliary: No mass or other parenchymal abnormality identified. Tiny  gallstones in the distended gallbladder (series 11, image 19). Intra and extrahepatic biliary ductal dilatation, common bile duct measuring up to 1.0 cm. Although not definitive, appearance of the distal common bile  duct is suspicious for the presence of a tiny calculus at the ampulla (series 10, image 11). Pancreas: No mass, inflammatory changes, or other parenchymal abnormality identified. Spleen:  Within normal limits in size and appearance. Adrenals/Urinary Tract: No masses identified. No evidence of hydronephrosis. Stomach/Bowel: Visualized portions within the abdomen are unremarkable. Vascular/Lymphatic: No pathologically enlarged lymph nodes identified. No abdominal aortic aneurysm demonstrated. Other:  None. Musculoskeletal: No suspicious bone lesions identified. IMPRESSION: 1. Intra and extrahepatic biliary ductal dilatation, common bile duct measuring up to 1.0 cm. Although not definitive, appearance of the distal common bile duct is suspicious for the presence of a tiny calculus at the ampulla. 2. Tiny gallstones in the distended gallbladder. No gallbladder wall thickening or pericholecystic fluid. 3. No imaging evidence of acute pancreatitis or complication such as acute pancreatic fluid collection. Electronically Signed   By: Lauralyn Primes M.D.   On: 07/15/2020 12:35   ECHOCARDIOGRAM COMPLETE  Result Date: 07/15/2020    ECHOCARDIOGRAM REPORT   Patient Name:   Alyssa Williamson Date of Exam: 07/15/2020 Medical Rec #:  [id number]     Height:       62.0 in Accession #:    [id number]    Weight:       193.6 lb Date of Birth:  [birth year]     BSA:          1.885 m Patient Age:    87 years      BP:           128/66 mmHg Patient Gender: F             HR:           69 bpm. Exam Location:  Inpatient Procedure: 2D Echo, Cardiac Doppler and Color Doppler Indications:    CHF  History:        Patient has prior history of Echocardiogram examinations, most                 recent 06/26/2019. Arrythmias:Atrial Fibrillation,                 Signs/Symptoms:Murmur; Risk Factors:Hypertension and                 Dyslipidemia.  Sonographer:    Neomia Dear RDCS Referring Phys: [id number] Jae Dire  Sonographer Comments: Patient is morbidly  obese. Image acquisition challenging due to patient body habitus. IMPRESSIONS  1. Left ventricular ejection fraction, by estimation, is 55 to 60%. The left ventricle has normal function. The left ventricle has no regional wall motion abnormalities. Left ventricular diastolic parameters are consistent with Grade II diastolic dysfunction (pseudonormalization).  2. Right ventricular systolic function is normal. The right ventricular size is mildly enlarged. There is mildly elevated pulmonary artery systolic pressure. The estimated right ventricular systolic pressure is 37.1 mmHg.  3. Right atrial size was mildly dilated.  4. The mitral valve is normal in structure. Mild mitral valve regurgitation. No evidence of mitral stenosis.  5. The aortic valve is normal in structure. There is mild calcification of the aortic valve. There is mild thickening of the aortic valve. Aortic valve regurgitation is not visualized. Mild aortic valve sclerosis is present, with no evidence of aortic valve stenosis.  6. The inferior vena cava is normal in size with greater than  50% respiratory variability, suggesting right atrial pressure of 3 mmHg. FINDINGS  Left Ventricle: Left ventricular ejection fraction, by estimation, is 55 to 60%. The left ventricle has normal function. The left ventricle has no regional wall motion abnormalities. The left ventricular internal cavity size was normal in size. There is  no left ventricular hypertrophy. Left ventricular diastolic parameters are consistent with Grade II diastolic dysfunction (pseudonormalization). Right Ventricle: The right ventricular size is mildly enlarged. No increase in right ventricular wall thickness. Right ventricular systolic function is normal. There is mildly elevated pulmonary artery systolic pressure. The tricuspid regurgitant velocity is 2.92 m/s, and with an assumed right atrial pressure of 3 mmHg, the estimated right ventricular systolic pressure is 37.1 mmHg. Left Atrium:  Left atrial size was normal in size. Right Atrium: Right atrial size was mildly dilated. Pericardium: There is no evidence of pericardial effusion. Mitral Valve: The mitral valve is normal in structure. Mild mitral annular calcification. Mild mitral valve regurgitation. No evidence of mitral valve stenosis. MV peak gradient, 8.0 mmHg. The mean mitral valve gradient is 3.0 mmHg. Tricuspid Valve: The tricuspid valve is normal in structure. Tricuspid valve regurgitation is trivial. No evidence of tricuspid stenosis. Aortic Valve: The aortic valve is normal in structure. There is mild calcification of the aortic valve. There is mild thickening of the aortic valve. Aortic valve regurgitation is not visualized. Mild aortic valve sclerosis is present, with no evidence of aortic valve stenosis. Aortic valve mean gradient measures 6.0 mmHg. Aortic valve peak gradient measures 10.2 mmHg. Aortic valve area, by VTI measures 1.93 cm. Pulmonic Valve: The pulmonic valve was normal in structure. Pulmonic valve regurgitation is not visualized. No evidence of pulmonic stenosis. Aorta: The aortic root is normal in size and structure. Venous: The inferior vena cava is normal in size with greater than 50% respiratory variability, suggesting right atrial pressure of 3 mmHg. IAS/Shunts: No atrial level shunt detected by color flow Doppler.  LEFT VENTRICLE PLAX 2D LVIDd:         5.20 cm     Diastology LVIDs:         2.60 cm     LV e' medial:    5.18 cm/s LV PW:         1.00 cm     LV E/e' medial:  22.4 LV IVS:        1.00 cm     LV e' lateral:   5.70 cm/s LVOT diam:     1.80 cm     LV E/e' lateral: 20.4 LV SV:         67 LV SV Index:   36 LVOT Area:     2.54 cm  LV Volumes (MOD) LV vol d, MOD A2C: 51.1 ml LV vol d, MOD A4C: 41.6 ml LV vol s, MOD A2C: 23.2 ml LV vol s, MOD A4C: 24.3 ml LV SV MOD A2C:     27.9 ml LV SV MOD A4C:     41.6 ml LV SV MOD BP:      21.0 ml RIGHT VENTRICLE RV Basal diam:  4.20 cm RV Mid diam:    2.50 cm RV S  prime:     13.10 cm/s TAPSE (M-mode): 2.6 cm LEFT ATRIUM             Index       RIGHT ATRIUM           Index LA diam:        4.10 cm 2.17  cm/m  RA Area:     15.10 cm LA Vol (A2C):   54.3 ml 28.80 ml/m RA Volume:   37.40 ml  19.84 ml/m LA Vol (A4C):   61.4 ml 32.57 ml/m LA Biplane Vol: 61.9 ml 32.83 ml/m  AORTIC VALVE                    PULMONIC VALVE AV Area (Vmax):    1.65 cm     PV Vmax:       0.81 m/s AV Area (Vmean):   1.80 cm     PV Vmean:      65.200 cm/s AV Area (VTI):     1.93 cm     PV VTI:        0.234 m AV Vmax:           160.00 cm/s  PV Peak grad:  2.6 mmHg AV Vmean:          109.000 cm/s PV Mean grad:  2.0 mmHg AV VTI:            0.350 m AV Peak Grad:      10.2 mmHg AV Mean Grad:      6.0 mmHg LVOT Vmax:         104.00 cm/s LVOT Vmean:        77.100 cm/s LVOT VTI:          0.265 m LVOT/AV VTI ratio: 0.76  AORTA Ao Root diam: 2.70 cm Ao Asc diam:  2.60 cm MITRAL VALVE                TRICUSPID VALVE MV Area (PHT): 5.62 cm     TR Peak grad:   34.1 mmHg MV Area VTI:   1.80 cm     TR Vmax:        292.00 cm/s MV Peak grad:  8.0 mmHg MV Mean grad:  3.0 mmHg     SHUNTS MV Vmax:       1.41 m/s     Systemic VTI:  0.26 m MV Vmean:      70.1 cm/s    Systemic Diam: 1.80 cm MV Decel Time: 135 msec MR Peak grad: 72.9 mmHg MR Mean grad: 53.0 mmHg MR Vmax:      427.00 cm/s MR Vmean:     352.0 cm/s MV E velocity: 116.00 cm/s MV A velocity: 45.90 cm/s MV E/A ratio:  2.53 Donato SchultzMark Skains MD Electronically signed by Donato SchultzMark Skains MD Signature Date/Time: 07/15/2020/2:21:55 PM    Final     Anti-infectives: Anti-infectives (From admission, onward)   Start     Dose/Rate Route Frequency Ordered Stop   07/17/20 1200  metroNIDAZOLE (FLAGYL) IVPB 500 mg       "And" Linked Group Details   500 mg 100 mL/hr over 60 Minutes Intravenous Every 8 hours 07/17/20 0752     07/17/20 1000  ciprofloxacin (CIPRO) IVPB 400 mg       "And" Linked Group Details   400 mg 200 mL/hr over 60 Minutes Intravenous Every 12 hours 07/17/20  0752     07/16/20 0900  ciprofloxacin (CIPRO) IVPB 400 mg  Status:  Discontinued       "And" Linked Group Details   400 mg 200 mL/hr over 60 Minutes Intravenous Every 24 hours 07/15/20 1326 07/17/20 0752   07/15/20 1415  metroNIDAZOLE (FLAGYL) IVPB 500 mg  Status:  Discontinued       "And" Linked Group Details   500 mg  100 mL/hr over 60 Minutes Intravenous Every 8 hours 07/15/20 1326 07/17/20 0752   07/14/20 2200  ciprofloxacin (CIPRO) IVPB 400 mg  Status:  Discontinued       "And" Linked Group Details   400 mg 200 mL/hr over 60 Minutes Intravenous Every 12 hours 07/14/20 1547 07/15/20 1326   07/14/20 2200  metroNIDAZOLE (FLAGYL) IVPB 500 mg  Status:  Discontinued       "And" Linked Group Details   500 mg 100 mL/hr over 60 Minutes Intravenous Every 8 hours 07/14/20 1547 07/15/20 1326   07/14/20 1645  ciprofloxacin (CIPRO) IVPB 400 mg  Status:  Discontinued       "And" Linked Group Details   400 mg 200 mL/hr over 60 Minutes Intravenous Every 12 hours 07/14/20 1545 07/14/20 1547   07/14/20 1645  metroNIDAZOLE (FLAGYL) IVPB 500 mg  Status:  Discontinued       "And" Linked Group Details   500 mg 100 mL/hr over 60 Minutes Intravenous Every 8 hours 07/14/20 1545 07/14/20 1547   07/14/20 1130  ciprofloxacin (CIPRO) IVPB 400 mg       "And" Linked Group Details   400 mg 200 mL/hr over 60 Minutes Intravenous  Once 07/14/20 1122 07/14/20 1312   07/14/20 1130  metroNIDAZOLE (FLAGYL) IVPB 500 mg       "And" Linked Group Details   500 mg 100 mL/hr over 60 Minutes Intravenous  Once 07/14/20 1122 07/14/20 2051       Assessment/Plan PAF on eliquis (last dose5/22 in PM) HTN HLD Diastolic CHF- cardiologist Dr. Jens Som, ECHO 06/26/19 EF 60-65%. Cardiology has seen and reports patient is "intermediate risk and no further cardiac work-up recommended." Recurrent UTIs Hypothyroidism Anxiety/depression Code status DNR AKI - Cr 1.08 from 1.64, improving  Acute pancreatitis, suspect gallstone  pancreatitis Possible cholecystitis Elevated LFTs -s/p ERCP 5/25: A filling defect consistent with a stone and sludge was seen on the cholangiogram, the examination was suspicious for choledocholithiasis, a biliary sphincterotomy was performed, the biliary tree was swept and sludge was found - Pain improving, LFTs and lipase down trending today. She does still have some wheezing and supplemental oxygen requirements today. Would like her to be medically optimized prior to surgery. Will advance to full liquids. NPO after midnight and repeat labs in AM. May be ready for lap chole with IOC tomorrow. Continue IV abx and holding eliquis.   ID -cipro/flagyl5/23>> VTE -sq heparin FEN -IVF per TRH,NPO Foley -none Follow up -TBD   LOS: 3 days    Alyssa Williamson, [facility] 07/17/2020, 9:25 AM Please see Amion for pager number during day hours 7:00am-4:30pm

## 2020-07-17 NOTE — Progress Notes (Signed)
PROGRESS NOTE    Alyssa Williamson  NFA:[id number] DOB: 26-Jun-1931 DOA: 07/14/2020 PCP: Bradd Canary, MD    Chief Complaint  Patient presents with  . Emesis    Brief Narrative:  87 year old female with past medical history of atrial fibrillation on Eliquis, hypertension, hyperlipidemia, hypothyroidism, chronic diastolic CHFwho presented [facility] with complaints of nausea, vomiting and right-sided abdominal pain with associated diarrhea for almost 2 weeks. Associated low-grade fever. She was treated with Bactrim for a UTI on 4/18. Saw her PCP on 5/17 when the patient reported her GI symptoms and her PCP initially felt patient had a norovirus infection as her symptoms initially resolved. Unfortunately her symptoms of right flank pain radiating to her lower abdomen with emesis and low-grade fevers recurred,prompting her to go to the ED.  She's been admitted with gallstone pancreatitis.    Assessment & Plan:   Principal Problem:   Cholecystitis Active Problems:   Hypothyroidism   HTN (hypertension)   Hyperlipidemia   Recurrent UTI   Atrial fibrillation (HCC)   Acute on chronic diastolic CHF (congestive heart failure) (HCC)   Acute biliary pancreatitis   AKI (acute kidney injury) (HCC)   Elevated LFTs   Acute lower UTI   Depression   Choledocholithiasis  1 gallstone pancreatitis/choledocholithiasis/acute cholecystitis -Patient presented with right upper quadrant abdominal pain, nausea vomiting noted to have a transaminitis with elevated bilirubin, lipase elevated (1086) transaminitis with AST and ALT is trending down. -Lipase trending down currently at 147 from 639 from 1086 -Abdominal ultrasound done concerning for acute cholecystitis. -MRCP with intra and extrahepatic biliary duct dilatation concerning for calculus at the ampulla, tiny gallstones and distended gallbladder, no imaging evidence of acute pancreatitis or complication, no gallbladder wall thickening or  pericholecystic fluid. -Patient was on ciprofloxacin/Flagyl. -GI following and patient underwent ERCP with filling defect consistent with a stone and sludge seen on cholangiogram, examination suspicious for choledocholithiasis, biliary sphincterotomy was performed, biliary tree swept and sludge found. -Leukocytosis trending down. -Tolerating clear liquids.  -Continue empiric IV ciprofloxacin and Flagyl. -Clinical improvement. -General surgery following and patient likely for inpatient cholecystectomy once acute pancreatitis cools down with continued clinical improvement and respiratory status improves hopefully in the next 1 to 2 days. -IV fluids have been saline locked and patient to be started on IV diuretics due to concerns for volume overload.. -General surgery and GI following and appreciate input and recommendations.  2.  Suspected acute on chronic diastolic CHF -Patient with complaints of wheezing, shortness of breath on exertion.   -Patient volume overloaded on examination.  -2D echo done with EF 55 to 60%, grade 2 diastolic dysfunction -IV fluids have been saline locked. -Home regimen diuretics on hold -Check a BNP, chest x-ray. -Placed on Lasix 20 mg IV every 12 hours. -Strict I's and O's. -Daily weights.  3.  Acute kidney injury -Creatinine trending up but went back down.  -Renal ultrasound negative for hydronephrosis. -IV fluid discontinued due to concerns for volume overload. -Patient to be placed on IV Lasix twice daily due to concerns for volume overload. -Monitor renal function with diuresis. -Follow.  4.  Chronic atrial fibrillation -Verapamil for rate control.   -Eliquis on hold in anticipation of cholecystectomy during this hospitalization.   5.  Hypertension -Continue verapamil.    6.  Hyperlipidemia -Statin on hold secondary to transaminitis. -Likely resume in the outpatient setting once transaminitis has resolved.  7.  Hypothyroidism -Continue  Synthroid.   8.  Asymptomatic UTI -Urine cultures with  no growth to date.   -On IV ciprofloxacin secondary to problem #1.  9.  Depression/anxiety -Continue to hold Celexa.   -Xanax as needed.     DVT prophylaxis: Heparin Code Status: DNR Family Communication:  Disposition:   Status is: Inpatient    Dispo: The patient is from: Home              Anticipated d/c is to: Home              Patient currently with acute pancreatitis, status post ERCP with choledocholithiasis, needing inpatient cholecystectomy.  Patient also volume overloaded on examination.  Not stable for discharge.   Difficult to place patient no       Consultants:   Cardiology: Dr. Bjorn Pippin 07/15/2020  Gastroenterology: Dr. Matthias Hughs 07/14/2020  General surgery: Dr. Daphine Deutscher 07/15/2020  Antimicrobials:   IV ciprofloxacin 07/14/2020>>>>  IV Flagyl 07/14/2020>>>>   Procedures:   Chest x-ray 07/15/2020, 07/16/2020, 07/17/2020  MRCP 07/15/2020  Renal ultrasound 07/15/2020  Abdominal ultrasound 07/14/2020  ERCP per Dr. Marca Ancona 07/16/2020   Subjective: Patient sitting up at the side of the bed with some complaints of shortness of breath on minimal exertion and per RN.  Patient with some audible wheezing.   Patient denies any chest pain.  Denies any significant abdominal pain.  No nausea or vomiting.   Objective: Vitals:   07/16/20 2137 07/17/20 0429 07/17/20 1246 07/17/20 1304  BP: 140/64 (!) 142/70 140/65   Pulse: 72 71 67   Resp: 16 18 16    Temp: 98.4 F (36.9 C) 98 F (36.7 C) (!) 97.5 F (36.4 C)   TempSrc: Oral Oral Oral   SpO2: 98% 96% 95%   Weight:    92.4 kg  Height:        Intake/Output Summary (Last 24 hours) at 07/17/2020 1309 Last data filed at 07/16/2020 1539 Gross per 24 hour  Intake 301.91 ml  Output --  Net 301.91 ml   Filed Weights   07/16/20 0500 07/16/20 0940 07/17/20 1304  Weight: 92.1 kg 83.9 kg 92.4 kg    Examination:  General exam: Appears calm and comfortable   Respiratory system: Expiratory wheezing.  Scattered crackles.  Fair air movement.  Speaking in full sentences.  Cardiovascular system: Regular rate and rhythm no murmurs rubs or gallops.  Positive JVD.  Trace to 1+ bilateral lower extremity edema. Gastrointestinal system: Abdomen is nondistended, soft and nontender. No organomegaly or masses felt. Normal bowel sounds heard. Central nervous system: Alert and oriented. No focal neurological deficits. Extremities: Symmetric 5 x 5 power. Skin: No rashes, lesions or ulcers Psychiatry: Judgement and insight appear normal. Mood & affect appropriate.     Data Reviewed: I have personally reviewed following labs and imaging studies  CBC: Recent Labs  Lab 07/14/20 0837 07/15/20 0407 07/16/20 0412 07/17/20 0404  WBC 12.9* 20.2* 14.8* 7.0  NEUTROABS  --   --  12.4*  --   HGB 13.6 12.6 12.1 11.1*  HCT 39.4 37.9 37.0 32.9*  MCV 95.4 99.7 101.4* 97.6  PLT 201 195 226 162    Basic Metabolic Panel: Recent Labs  Lab 07/14/20 0837 07/15/20 0407 07/15/20 1750 07/16/20 0412 07/17/20 0404  NA 136 135 133* 134* 133*  K 3.7 4.0 4.2 4.6 4.5  CL 100 101 97* 99 100  CO2 26 25 27 28 28   GLUCOSE 108* 91 101* 82 141*  BUN 17 15 26* 26* 24*  CREATININE 0.89 1.37* 1.63* 1.64* 1.08*  CALCIUM 8.4* 8.2* 8.1*  8.3* 8.4*  MG  --   --   --  2.0  --   PHOS  --   --   --  3.9  --     GFR: Estimated Creatinine Clearance: 38.8 mL/min (A) (by C-G formula based on SCr of 1.08 mg/dL (H)).  Liver Function Tests: Recent Labs  Lab 07/14/20 0837 07/15/20 0407 07/16/20 0412 07/17/20 0404  AST 636* 569* 301* 149*  ALT 383* 578* 394* 268*  ALKPHOS 119 133* 141* 128*  BILITOT 1.9* 6.0* 3.2* 1.6*  PROT 6.7 5.9* 6.0* 5.9*  ALBUMIN 3.2* 2.9* 3.1* 2.8*    CBG: No results for input(s): GLUCAP in the last 168 hours.   Recent Results (from the past 240 hour(s))  Resp Panel by RT-PCR (Flu A&B, Covid) Nasopharyngeal Swab     Status: None   Collection Time:  07/14/20 10:39 AM   Specimen: Nasopharyngeal Swab; Nasopharyngeal(NP) swabs in vial transport medium  Result Value Ref Range Status   SARS Coronavirus 2 by RT PCR NEGATIVE NEGATIVE Final    Comment: (NOTE) SARS-CoV-2 target nucleic acids are NOT DETECTED.  The SARS-CoV-2 RNA is generally detectable in upper respiratory specimens during the acute phase of infection. The lowest concentration of SARS-CoV-2 viral copies this assay can detect is 138 copies/mL. A negative result does not preclude SARS-Cov-2 infection and should not be used as the sole basis for treatment or other patient management decisions. A negative result may occur with  improper specimen collection/handling, submission of specimen other than nasopharyngeal swab, presence of viral mutation(s) within the areas targeted by this assay, and inadequate number of viral copies(<138 copies/mL). A negative result must be combined with clinical observations, patient history, and epidemiological information. The expected result is Negative.  Fact Sheet for Patients:  BloggerCourse.com  Fact Sheet for Healthcare Providers:  SeriousBroker.it  This test is no t yet approved or cleared by the [place] FDA and  has been authorized for detection and/or diagnosis of SARS-CoV-2 by FDA under an Emergency Use Authorization (EUA). This EUA will remain  in effect (meaning this test can be used) for the duration of the COVID-19 declaration under Section 564(b)(1) of the Act, 21 U.S.C.section 360bbb-3(b)(1), unless the authorization is terminated  or revoked sooner.       Influenza A by PCR NEGATIVE NEGATIVE Final   Influenza B by PCR NEGATIVE NEGATIVE Final    Comment: (NOTE) The Xpert Xpress SARS-CoV-2/FLU/RSV plus assay is intended as an aid in the diagnosis of influenza from Nasopharyngeal swab specimens and should not be used as a sole basis for treatment. Nasal washings  and aspirates are unacceptable for Xpert Xpress SARS-CoV-2/FLU/RSV testing.  Fact Sheet for Patients: BloggerCourse.com  Fact Sheet for Healthcare Providers: SeriousBroker.it  This test is not yet approved or cleared by the [place] FDA and has been authorized for detection and/or diagnosis of SARS-CoV-2 by FDA under an Emergency Use Authorization (EUA). This EUA will remain in effect (meaning this test can be used) for the duration of the COVID-19 declaration under Section 564(b)(1) of the Act, 21 U.S.C. section 360bbb-3(b)(1), unless the authorization is terminated or revoked.  Performed at [facility], [address] [place], [place] [address]   Culture, Urine     Status: None   Collection Time: 07/15/20  9:30 PM   Specimen: Urine, Clean Catch  Result Value Ref Range Status   Specimen Description   Final    URINE, CLEAN CATCH Performed at [facility], [address], [place], [place] [address]    Special Requests   Final    NONE Performed at [facility], [address], [place], [place] [address]    Culture   Final    NO GROWTH Performed at [facility], [address] [place], [place] [address]    Report Status 07/17/2020 FINAL  Final         Radiology Studies: US RENAL  Result Date: 07/15/2020 CLINICAL DATA:  87 year old female with acute renal insufficiency. EXAM: RENAL / URINARY TRACT ULTRASOUND COMPLETE COMPARISON:  Abdominal MRI dated 07/15/2020 FINDINGS: Right Kidney: Renal measurements: 6.7 x 3.7 x 3.9 cm = volume: 51 mL. There is moderate parenchyma atrophy and cortical thinning. Normal echogenicity. No hydronephrosis or shadowing stone. Left Kidney: Renal measurements: 9.7 x 4.5 x 5.0 cm = volume: 150 mL. Mild parenchyma atrophy. Normal echogenicity. No hydronephrosis or shadowing stone. Bladder: Appears normal for degree of bladder distention.  Other: None. IMPRESSION: Moderate right and mild left renal parenchyma atrophy. No hydronephrosis or shadowing stone. Electronically Signed   By: Elgie Collard M.D.   On: 07/15/2020 20:35   DG CHEST PORT 1 VIEW  Result Date: 07/16/2020 CLINICAL DATA:  Shortness of breath. EXAM: PORTABLE CHEST 1 VIEW COMPARISON:  Chest x-ray 07/15/20 FINDINGS: The heart size and mediastinal contours are unchanged. Aortic calcifications. No focal consolidation. No pulmonary edema. No pleural effusion. No pneumothorax. No acute osseous abnormality. IMPRESSION: No active disease. Electronically Signed   By: Tish Frederickson M.D.   On: 07/16/2020 05:27   DG ERCP BILIARY & PANCREATIC DUCTS  Result Date: 07/16/2020 CLINICAL DATA:  Probable choledocholithiasis EXAM: ERCP TECHNIQUE: Multiple spot images obtained with the fluoroscopic device and submitted for interpretation post-procedure. FLUOROSCOPY TIME:  Fluoroscopy Time:  2 minutes 49 seconds Radiation Exposure Index (if provided by the fluoroscopic device): 49.8 Number of Acquired Spot Images: 0 COMPARISON:  MRCP 07/15/2020 FINDINGS: Nine intraoperative saved images demonstrate a flexible endoscope in the descending duodenum followed by wire cannulation of the intrahepatic ducts and cholangiogram. Mild biliary ductal dilatation. Subsequent images demonstrate sphincterotomy and balloon sweeping of the common duct. IMPRESSION: ERCP with sphincterotomy and balloon sweeping of the common duct. These images were submitted for radiologic interpretation only. Please see the procedural report for the amount of contrast and the fluoroscopy time utilized. Electronically Signed   By: Malachy Moan M.D.   On: 07/16/2020 11:49   ECHOCARDIOGRAM COMPLETE  Result Date: 07/15/2020    ECHOCARDIOGRAM REPORT   Patient Name:   Alyssa Williamson Date of Exam: 07/15/2020 Medical Rec #:  [id number]     Height:       62.0 in Accession #:    [id number]    Weight:       193.6 lb Date of Birth:   04-08-1931     BSA:          1.885 m Patient Age:    89 years      BP:           128/66 mmHg Patient Gender: F             HR:           69 bpm. Exam Location:  Inpatient Procedure: 2D Echo, Cardiac Doppler and Color Doppler Indications:    CHF  History:        Patient has prior history of Echocardiogram examinations, most  recent 06/26/2019. Arrythmias:Atrial Fibrillation,                 Signs/Symptoms:Murmur; Risk Factors:Hypertension and                 Dyslipidemia.  Sonographer:    Neomia Dear RDCS Referring Phys: [id number] Jae Dire  Sonographer Comments: Patient is morbidly obese. Image acquisition challenging due to patient body habitus. IMPRESSIONS  1. Left ventricular ejection fraction, by estimation, is 55 to 60%. The left ventricle has normal function. The left ventricle has no regional wall motion abnormalities. Left ventricular diastolic parameters are consistent with Grade II diastolic dysfunction (pseudonormalization).  2. Right ventricular systolic function is normal. The right ventricular size is mildly enlarged. There is mildly elevated pulmonary artery systolic pressure. The estimated right ventricular systolic pressure is 37.1 mmHg.  3. Right atrial size was mildly dilated.  4. The mitral valve is normal in structure. Mild mitral valve regurgitation. No evidence of mitral stenosis.  5. The aortic valve is normal in structure. There is mild calcification of the aortic valve. There is mild thickening of the aortic valve. Aortic valve regurgitation is not visualized. Mild aortic valve sclerosis is present, with no evidence of aortic valve stenosis.  6. The inferior vena cava is normal in size with greater than 50% respiratory variability, suggesting right atrial pressure of 3 mmHg. FINDINGS  Left Ventricle: Left ventricular ejection fraction, by estimation, is 55 to 60%. The left ventricle has normal function. The left ventricle has no regional wall motion abnormalities. The left  ventricular internal cavity size was normal in size. There is  no left ventricular hypertrophy. Left ventricular diastolic parameters are consistent with Grade II diastolic dysfunction (pseudonormalization). Right Ventricle: The right ventricular size is mildly enlarged. No increase in right ventricular wall thickness. Right ventricular systolic function is normal. There is mildly elevated pulmonary artery systolic pressure. The tricuspid regurgitant velocity is 2.92 m/s, and with an assumed right atrial pressure of 3 mmHg, the estimated right ventricular systolic pressure is 37.1 mmHg. Left Atrium: Left atrial size was normal in size. Right Atrium: Right atrial size was mildly dilated. Pericardium: There is no evidence of pericardial effusion. Mitral Valve: The mitral valve is normal in structure. Mild mitral annular calcification. Mild mitral valve regurgitation. No evidence of mitral valve stenosis. MV peak gradient, 8.0 mmHg. The mean mitral valve gradient is 3.0 mmHg. Tricuspid Valve: The tricuspid valve is normal in structure. Tricuspid valve regurgitation is trivial. No evidence of tricuspid stenosis. Aortic Valve: The aortic valve is normal in structure. There is mild calcification of the aortic valve. There is mild thickening of the aortic valve. Aortic valve regurgitation is not visualized. Mild aortic valve sclerosis is present, with no evidence of aortic valve stenosis. Aortic valve mean gradient measures 6.0 mmHg. Aortic valve peak gradient measures 10.2 mmHg. Aortic valve area, by VTI measures 1.93 cm. Pulmonic Valve: The pulmonic valve was normal in structure. Pulmonic valve regurgitation is not visualized. No evidence of pulmonic stenosis. Aorta: The aortic root is normal in size and structure. Venous: The inferior vena cava is normal in size with greater than 50% respiratory variability, suggesting right atrial pressure of 3 mmHg. IAS/Shunts: No atrial level shunt detected by color flow Doppler.   LEFT VENTRICLE PLAX 2D LVIDd:         5.20 cm     Diastology LVIDs:         2.60 cm     LV e' medial:  5.18 cm/s LV PW:         1.00 cm     LV E/e' medial:  22.4 LV IVS:        1.00 cm     LV e' lateral:   5.70 cm/s LVOT diam:     1.80 cm     LV E/e' lateral: 20.4 LV SV:         67 LV SV Index:   36 LVOT Area:     2.54 cm  LV Volumes (MOD) LV vol d, MOD A2C: 51.1 ml LV vol d, MOD A4C: 41.6 ml LV vol s, MOD A2C: 23.2 ml LV vol s, MOD A4C: 24.3 ml LV SV MOD A2C:     27.9 ml LV SV MOD A4C:     41.6 ml LV SV MOD BP:      21.0 ml RIGHT VENTRICLE RV Basal diam:  4.20 cm RV Mid diam:    2.50 cm RV S prime:     13.10 cm/s TAPSE (M-mode): 2.6 cm LEFT ATRIUM             Index       RIGHT ATRIUM           Index LA diam:        4.10 cm 2.17 cm/m  RA Area:     15.10 cm LA Vol (A2C):   54.3 ml 28.80 ml/m RA Volume:   37.40 ml  19.84 ml/m LA Vol (A4C):   61.4 ml 32.57 ml/m LA Biplane Vol: 61.9 ml 32.83 ml/m  AORTIC VALVE                    PULMONIC VALVE AV Area (Vmax):    1.65 cm     PV Vmax:       0.81 m/s AV Area (Vmean):   1.80 cm     PV Vmean:      65.200 cm/s AV Area (VTI):     1.93 cm     PV VTI:        0.234 m AV Vmax:           160.00 cm/s  PV Peak grad:  2.6 mmHg AV Vmean:          109.000 cm/s PV Mean grad:  2.0 mmHg AV VTI:            0.350 m AV Peak Grad:      10.2 mmHg AV Mean Grad:      6.0 mmHg LVOT Vmax:         104.00 cm/s LVOT Vmean:        77.100 cm/s LVOT VTI:          0.265 m LVOT/AV VTI ratio: 0.76  AORTA Ao Root diam: 2.70 cm Ao Asc diam:  2.60 cm MITRAL VALVE                TRICUSPID VALVE MV Area (PHT): 5.62 cm     TR Peak grad:   34.1 mmHg MV Area VTI:   1.80 cm     TR Vmax:        292.00 cm/s MV Peak grad:  8.0 mmHg MV Mean grad:  3.0 mmHg     SHUNTS MV Vmax:       1.41 m/s     Systemic VTI:  0.26 m MV Vmean:      70.1 cm/s    Systemic Diam: 1.80 cm MV Decel Time: 135 msec  MR Peak grad: 72.9 mmHg MR Mean grad: 53.0 mmHg MR Vmax:      427.00 cm/s MR Vmean:     352.0 cm/s MV E velocity:  116.00 cm/s MV A velocity: 45.90 cm/s MV E/A ratio:  2.53 Donato SchultzMark Skains MD Electronically signed by Donato SchultzMark Skains MD Signature Date/Time: 07/15/2020/2:21:55 PM    Final         Scheduled Meds: . feeding supplement  1 Container Oral BID BM  . fluticasone furoate-vilanterol  1 puff Inhalation Daily  . furosemide  20 mg Intravenous Q12H  . heparin  5,000 Units Subcutaneous Q8H  . indomethacin  100 mg Rectal Once  . levothyroxine  75 mcg Oral QAC breakfast  . sodium chloride flush  10-40 mL Intracatheter Q12H  . verapamil  240 mg Oral Daily   Continuous Infusions: . ciprofloxacin 400 mg (07/17/20 1126)   And  . metronidazole 500 mg (07/17/20 1236)     LOS: 3 days    Time spent: 40 minutes    Ramiro Harvestaniel Lailani Tool, MD [facility]   To contact the attending provider between 7A-7P or the covering provider during after hours 7P-7A, please log into the web site [email or web address] and access using universal [place] password for that web site. If you do not have the password, please call the hospital operator.  07/17/2020, 1:09 PM

## 2020-07-17 NOTE — Plan of Care (Signed)

## 2020-07-17 NOTE — Progress Notes (Signed)
[facility] Gastroenterology Progress Note  Alyssa Williamson 87 y.o. [birth year]  CC:  Gallstone pancreatitis  Subjective: Patient states her abdominal pain has resolved.  Denies nausea/vomiting.  Reports mild heartburn.  ROS : Review of Systems  Cardiovascular: Negative for chest pain and palpitations.  Gastrointestinal: Positive for heartburn. Negative for abdominal pain, blood in stool, constipation, diarrhea, melena, nausea and vomiting.   Objective: Vital signs in last 24 hours: Vitals:   07/16/20 2137 07/17/20 0429  BP: 140/64 (!) 142/70  Pulse: 72 71  Resp: 16 18  Temp: 98.4 F (36.9 C) 98 F (36.7 C)  SpO2: 98% 96%    Physical Exam:  General:  Alert, cooperative, no distress, daughter at bedside  Head:  Normocephalic, without obvious abnormality, atraumatic  Eyes:  Anicteric sclera, EOMs intact  Lungs:   Breathing comfortably with nasal cannula in place  Abdomen:   Soft, non-tender, non-distended  Extremities: No lower extremity edema    Lab Results: Recent Labs    07/16/20 0412 07/17/20 0404  NA 134* 133*  K 4.6 4.5  CL 99 100  CO2 28 28  GLUCOSE 82 141*  BUN 26* 24*  CREATININE 1.64* 1.08*  CALCIUM 8.3* 8.4*  MG 2.0  --   PHOS 3.9  --    Recent Labs    07/16/20 0412 07/17/20 0404  AST 301* 149*  ALT 394* 268*  ALKPHOS 141* 128*  BILITOT 3.2* 1.6*  PROT 6.0* 5.9*  ALBUMIN 3.1* 2.8*   Recent Labs    07/16/20 0412 07/17/20 0404  WBC 14.8* 7.0  NEUTROABS 12.4*  --   HGB 12.1 11.1*  HCT 37.0 32.9*  MCV 101.4* 97.6  PLT 226 162   No results for input(s): LABPROT, INR in the last 72 hours.    Assessment: Acute gallstone pancreatitis s/p ERCP 5/25: A filling defect consistent with a stone and sludge was seen on the cholangiogram.  The examination was suspicious for choledocholithiasis.  A biliary sphincterotomy was performed. The biliary tree was swept and sludge was found. -Lipase tending down, now 147 -T. Bili 1.6, decreased from 3.2  yesterday.  Other LFTs gradually decreasing: AST 149/ ALT 268/ ALP 128 -Leukocytosis resolved (WBCs 7.0) -Cr 1.08, improved from yesterday 1.37  A. fib, onEliquis, last dose5/22 PM  DiastolicCHF: EF 60 to 65% as of echo 06/2019  Plan: Continue to trend LFTs to normalization.  CCS is following with plans for lap chole tomorrow.  Pepcid PRN for heartburn.   [facility] will sign off. Please contact us if we can be of any further assistance during this hospital stay.  Edrick Kins PA-C 07/17/2020, 10:07 AM  Contact #  [phone]

## 2020-07-17 NOTE — Care Management Important Message (Signed)
Important Message  Patient Details IM Letter given to the Patient. Name: Alyssa Williamson MRN: [id number] Date of Birth: February 15, 1932   Medicare Important Message Given:  Yes     Caren Macadam 07/17/2020, 11:29 AM

## 2020-07-18 ENCOUNTER — Inpatient Hospital Stay (HOSPITAL_COMMUNITY): Payer: Medicare Other | Admitting: Certified Registered"

## 2020-07-18 ENCOUNTER — Encounter (HOSPITAL_COMMUNITY)
Admission: EM | Disposition: A | Discharge status: Home / Self Care | Payer: Self-pay | Source: Home / Self Care | Attending: Internal Medicine

## 2020-07-18 ENCOUNTER — Encounter (HOSPITAL_COMMUNITY): Payer: Self-pay | Admitting: Internal Medicine

## 2020-07-18 DIAGNOSIS — K851 Biliary acute pancreatitis without necrosis or infection: Secondary | ICD-10-CM | POA: Diagnosis not present

## 2020-07-18 DIAGNOSIS — K819 Cholecystitis, unspecified: Secondary | ICD-10-CM | POA: Diagnosis not present

## 2020-07-18 DIAGNOSIS — Z9049 Acquired absence of other specified parts of digestive tract: Secondary | ICD-10-CM

## 2020-07-18 DIAGNOSIS — N179 Acute kidney failure, unspecified: Secondary | ICD-10-CM | POA: Diagnosis not present

## 2020-07-18 DIAGNOSIS — I5033 Acute on chronic diastolic (congestive) heart failure: Secondary | ICD-10-CM | POA: Diagnosis not present

## 2020-07-18 LAB — COMPREHENSIVE METABOLIC PANEL
ALT: 212 U/L — ABNORMAL HIGH (ref 0–44)
AST: 89 U/L — ABNORMAL HIGH (ref 15–41)
Albumin: 3 g/dL — ABNORMAL LOW (ref 3.5–5.0)
Alkaline Phosphatase: 117 U/L (ref 38–126)
Anion gap: 10 (ref 5–15)
BUN: 27 mg/dL — ABNORMAL HIGH (ref 8–23)
CO2: 27 mmol/L (ref 22–32)
Calcium: 8.5 mg/dL — ABNORMAL LOW (ref 8.9–10.3)
Chloride: 99 mmol/L (ref 98–111)
Creatinine, Ser: 1.16 mg/dL — ABNORMAL HIGH (ref 0.44–1.00)
GFR, Estimated: 46 mL/min — ABNORMAL LOW (ref >60)
Glucose, Bld: 128 mg/dL — ABNORMAL HIGH (ref 70–99)
Potassium: 4.1 mmol/L (ref 3.5–5.1)
Sodium: 136 mmol/L (ref 135–145)
Total Bilirubin: 1.2 mg/dL (ref 0.3–1.2)
Total Protein: 5.9 g/dL — ABNORMAL LOW (ref 6.5–8.1)

## 2020-07-18 LAB — LIPASE, BLOOD: Lipase: 83 U/L — ABNORMAL HIGH (ref 11–51)

## 2020-07-18 LAB — MAGNESIUM: Magnesium: 2.1 mg/dL (ref 1.7–2.4)

## 2020-07-18 LAB — CBC
HCT: 34.4 % — ABNORMAL LOW (ref 36.0–46.0)
Hemoglobin: 11.5 g/dL — ABNORMAL LOW (ref 12.0–15.0)
MCH: 33.1 pg (ref 26.0–34.0)
MCHC: 33.4 g/dL (ref 30.0–36.0)
MCV: 99.1 fL (ref 80.0–100.0)
Platelets: 192 10*3/uL (ref 150–400)
RBC: 3.47 MIL/uL — ABNORMAL LOW (ref 3.87–5.11)
RDW: 14.1 % (ref 11.5–15.5)
WBC: 9.7 10*3/uL (ref 4.0–10.5)
nRBC: 0 % (ref 0.0–0.2)

## 2020-07-18 LAB — TYPE AND SCREEN
ABO/RH(D): A POS
Antibody Screen: NEGATIVE

## 2020-07-18 LAB — MRSA PCR SCREENING: MRSA by PCR: NEGATIVE

## 2020-07-18 LAB — BRAIN NATRIURETIC PEPTIDE: B Natriuretic Peptide: 384.6 pg/mL — ABNORMAL HIGH (ref 0.0–100.0)

## 2020-07-18 SURGERY — GASTRECTOMY, SLEEVE, ROBOT-ASSISTED
Anesthesia: General

## 2020-07-18 MED ORDER — LACTATED RINGERS IV SOLN
INTRAVENOUS | Status: AC | PRN
  Administered 2020-07-18: 1000 mL

## 2020-07-18 MED ORDER — CHLORHEXIDINE GLUCONATE CLOTH 2 % EX PADS
6.0000 | MEDICATED_PAD | Freq: Once | CUTANEOUS | Status: AC
  Administered 2020-07-18: 6 via TOPICAL

## 2020-07-18 MED ORDER — PROPOFOL 10 MG/ML IV BOLUS
INTRAVENOUS | Status: DC | PRN
  Administered 2020-07-18: 130 mg via INTRAVENOUS

## 2020-07-18 MED ORDER — FENTANYL CITRATE (PF) 100 MCG/2ML IJ SOLN
INTRAMUSCULAR | Status: AC
  Filled 2020-07-18: qty 2

## 2020-07-18 MED ORDER — LIDOCAINE 2% (20 MG/ML) 5 ML SYRINGE
INTRAMUSCULAR | Status: DC | PRN
  Administered 2020-07-18: 60 mg via INTRAVENOUS

## 2020-07-18 MED ORDER — IPRATROPIUM BROMIDE 0.02 % IN SOLN
0.5000 mg | Freq: Three times a day (TID) | RESPIRATORY_TRACT | Status: DC
  Administered 2020-07-19: 0.5 mg via RESPIRATORY_TRACT
  Filled 2020-07-18: qty 2.5

## 2020-07-18 MED ORDER — LEVALBUTEROL HCL 0.63 MG/3ML IN NEBU
0.6300 mg | INHALATION_SOLUTION | Freq: Three times a day (TID) | RESPIRATORY_TRACT | Status: DC
  Administered 2020-07-19: 0.63 mg via RESPIRATORY_TRACT
  Filled 2020-07-18: qty 3

## 2020-07-18 MED ORDER — ROCURONIUM BROMIDE 10 MG/ML (PF) SYRINGE
PREFILLED_SYRINGE | INTRAVENOUS | Status: AC
  Filled 2020-07-18: qty 20

## 2020-07-18 MED ORDER — BUPIVACAINE LIPOSOME 1.3 % IJ SUSP
20.0000 mL | Freq: Once | INTRAMUSCULAR | Status: AC
  Administered 2020-07-18: 20 mL
  Filled 2020-07-18: qty 20

## 2020-07-18 MED ORDER — ONDANSETRON HCL 4 MG/2ML IJ SOLN
INTRAMUSCULAR | Status: DC | PRN
  Administered 2020-07-18: 4 mg via INTRAVENOUS

## 2020-07-18 MED ORDER — LEVALBUTEROL HCL 0.63 MG/3ML IN NEBU
0.6300 mg | INHALATION_SOLUTION | Freq: Four times a day (QID) | RESPIRATORY_TRACT | Status: DC
  Administered 2020-07-18 (×2): 0.63 mg via RESPIRATORY_TRACT
  Filled 2020-07-18 (×2): qty 3

## 2020-07-18 MED ORDER — PHENYLEPHRINE HCL-NACL 10-0.9 MG/250ML-% IV SOLN
INTRAVENOUS | Status: DC | PRN
  Administered 2020-07-18: 20 ug/min via INTRAVENOUS

## 2020-07-18 MED ORDER — DEXAMETHASONE SODIUM PHOSPHATE 10 MG/ML IJ SOLN
INTRAMUSCULAR | Status: DC | PRN
  Administered 2020-07-18: 5 mg via INTRAVENOUS

## 2020-07-18 MED ORDER — ROCURONIUM BROMIDE 10 MG/ML (PF) SYRINGE
PREFILLED_SYRINGE | INTRAVENOUS | Status: DC | PRN
  Administered 2020-07-18: 20 mg via INTRAVENOUS
  Administered 2020-07-18: 50 mg via INTRAVENOUS

## 2020-07-18 MED ORDER — FENTANYL CITRATE (PF) 100 MCG/2ML IJ SOLN
INTRAMUSCULAR | Status: DC | PRN
  Administered 2020-07-18 (×2): 50 ug via INTRAVENOUS

## 2020-07-18 MED ORDER — INDOCYANINE GREEN 25 MG IV SOLR
7.5000 mg | INTRAVENOUS | Status: AC
  Administered 2020-07-18: 7.5 mg via INTRAVENOUS
  Filled 2020-07-18: qty 3

## 2020-07-18 MED ORDER — LACTATED RINGERS IV SOLN: INTRAVENOUS | Status: DC

## 2020-07-18 MED ORDER — AMISULPRIDE (ANTIEMETIC) 5 MG/2ML IV SOLN: 10.0000 mg | Freq: Once | INTRAVENOUS | Status: DC | PRN

## 2020-07-18 MED ORDER — FENTANYL CITRATE (PF) 100 MCG/2ML IJ SOLN
25.0000 ug | INTRAMUSCULAR | Status: DC | PRN
  Administered 2020-07-18 (×2): 25 ug via INTRAVENOUS

## 2020-07-18 MED ORDER — ROCURONIUM BROMIDE 10 MG/ML (PF) SYRINGE
PREFILLED_SYRINGE | INTRAVENOUS | Status: AC
  Filled 2020-07-18: qty 10

## 2020-07-18 MED ORDER — SUGAMMADEX SODIUM 200 MG/2ML IV SOLN
INTRAVENOUS | Status: DC | PRN
  Administered 2020-07-18: 200 mg via INTRAVENOUS

## 2020-07-18 MED ORDER — IPRATROPIUM BROMIDE 0.02 % IN SOLN
0.5000 mg | Freq: Four times a day (QID) | RESPIRATORY_TRACT | Status: DC
  Administered 2020-07-18 (×2): 0.5 mg via RESPIRATORY_TRACT
  Filled 2020-07-18 (×2): qty 2.5

## 2020-07-18 SURGICAL SUPPLY — 52 items
ADH SKN CLS APL DERMABOND .7 (GAUZE/BANDAGES/DRESSINGS) ×1
APPLIER CLIP 5 13 M/L LIGAMAX5 (MISCELLANEOUS)
APR CLP MED LRG 5 ANG JAW (MISCELLANEOUS)
BLADE SURG 15 STRL LF DISP TIS (BLADE) ×1 IMPLANT
BLADE SURG 15 STRL SS (BLADE) ×2
CLIP APPLIE 5 13 M/L LIGAMAX5 (MISCELLANEOUS) IMPLANT
CLIP VESOLOCK LG 6/CT PURPLE (CLIP) ×1 IMPLANT
CLIP VESOLOCK MED LG 6/CT (CLIP) ×1 IMPLANT
COVER SURGICAL LIGHT HANDLE (MISCELLANEOUS) ×2 IMPLANT
COVER TIP SHEARS 8 DVNC (MISCELLANEOUS) ×1 IMPLANT
COVER TIP SHEARS 8MM DA VINCI (MISCELLANEOUS) ×2
COVER WAND RF STERILE (DRAPES) IMPLANT
DECANTER SPIKE VIAL GLASS SM (MISCELLANEOUS) ×2 IMPLANT
DERMABOND ADVANCED (GAUZE/BANDAGES/DRESSINGS) ×1
DERMABOND ADVANCED .7 DNX12 (GAUZE/BANDAGES/DRESSINGS) ×1 IMPLANT
DEVICE TROCAR PUNCTURE CLOSURE (ENDOMECHANICALS) IMPLANT
DISSECTOR BLUNT TIP ENDO 5MM (MISCELLANEOUS) IMPLANT
DRAPE ARM DVNC X/XI (DISPOSABLE) ×4 IMPLANT
DRAPE COLUMN DVNC XI (DISPOSABLE) ×1 IMPLANT
DRAPE DA VINCI XI ARM (DISPOSABLE) ×8
DRAPE DA VINCI XI COLUMN (DISPOSABLE) ×2
ELECT REM PT RETURN 15FT ADLT (MISCELLANEOUS) ×2 IMPLANT
ENDOLOOP SUT PDS II  0 18 (SUTURE)
ENDOLOOP SUT PDS II 0 18 (SUTURE) IMPLANT
GAUZE 4X4 16PLY RFD (DISPOSABLE) ×2 IMPLANT
GLOVE SURG ENC TEXT LTX SZ8 (GLOVE) ×4 IMPLANT
GOWN STRL REUS W/TWL XL LVL3 (GOWN DISPOSABLE) ×8 IMPLANT
GRASPER SUT TROCAR 14GX15 (MISCELLANEOUS) ×1 IMPLANT
KIT BASIN OR (CUSTOM PROCEDURE TRAY) ×2 IMPLANT
KIT TURNOVER KIT A (KITS) ×2 IMPLANT
MANIFOLD NEPTUNE II (INSTRUMENTS) ×2 IMPLANT
MARKER SKIN DUAL TIP RULER LAB (MISCELLANEOUS) ×2 IMPLANT
NEEDLE HYPO 22GX1.5 SAFETY (NEEDLE) ×2 IMPLANT
PACK CARDIOVASCULAR III (CUSTOM PROCEDURE TRAY) ×2 IMPLANT
PENCIL SMOKE EVACUATOR (MISCELLANEOUS) IMPLANT
SCISSORS LAP 5X35 DISP (ENDOMECHANICALS) ×2 IMPLANT
SEAL CANN UNIV 5-8 DVNC XI (MISCELLANEOUS) ×4 IMPLANT
SEAL XI 5MM-8MM UNIVERSAL (MISCELLANEOUS) ×8
SEALER VESSEL DA VINCI XI (MISCELLANEOUS) ×2
SEALER VESSEL EXT DVNC XI (MISCELLANEOUS) ×1 IMPLANT
SET BI-LUMEN FLTR TB AIRSEAL (TUBING) ×2 IMPLANT
SLEEVE XCEL OPT CAN 5 100 (ENDOMECHANICALS) IMPLANT
SOLUTION ELECTROLUBE (MISCELLANEOUS) ×2 IMPLANT
STAPLER VISISTAT 35W (STAPLE) ×2 IMPLANT
SUT VIC AB 4-0 SH 18 (SUTURE) ×2 IMPLANT
SUT VICRYL 0 UR6 27IN ABS (SUTURE) ×1 IMPLANT
SYR 20ML LL LF (SYRINGE) ×2 IMPLANT
SYS RETRIEVAL 5MM INZII UNIV (BASKET) ×2
SYSTEM RETRIEVL 5MM INZII UNIV (BASKET) IMPLANT
TOWEL OR 17X26 10 PK STRL BLUE (TOWEL DISPOSABLE) ×2 IMPLANT
TOWEL OR NON WOVEN STRL DISP B (DISPOSABLE) ×2 IMPLANT
TROCAR BLADELESS OPT 5 100 (ENDOMECHANICALS) ×2 IMPLANT

## 2020-07-18 NOTE — Interval H&P Note (Signed)
History and Physical Interval Note:  07/18/2020 1:53 PM  Alyssa Williamson  has presented today for surgery, with the diagnosis of CHRONIC CHOLECYSTITIS.  The various methods of treatment have been discussed with the patient and family. After consideration of risks, benefits and other options for treatment, the patient has consented to  Procedure(s): XI ROBOT ASSISTED CHOLECYSTECTOMY (N/A) as a surgical intervention.  The patient's history has been reviewed, patient examined, no change in status, stable for surgery.  I have reviewed the patient's chart and labs.  Questions were answered to the patient's satisfaction.     Valarie Merino

## 2020-07-18 NOTE — Op Note (Signed)
Alyssa Williamson  Primary Care Physician:  Bradd Canary, MD    07/18/2020  5:28 PM  Procedure: [employer] Robotic Cholecystectomy   Surgeon: Pollyann Savoy. Daphine Deutscher, MD, FACS Asst:  none  Anes:  General  Drains:  None  Findings: Chronic cholecystitis  Description of Procedure: The patient was taken to OR 2 on Friday, May 27 and given general anesthesia.  The patient was prepped with chlorohexidine prep and draped sterilely. A time out was performed including identifying the patient and discussing their procedure.  Access to the abdomen was achieved with a 5 mm Optiview through the left upper quadrant for my assis.  Port placement included 4 robotic trocars spaced across the abdomen with the camera to the right of midline slightly.    The gallbladder was visualized and appeared gray/white and chronic cholecystitis.   The fundus of the gallbaldder was grasped and the gallbladder was elevated with the fenestrated bipolar on the right lateral location. Traction on the infundibulum allowed for successful demonstration of the critical view. Inflammatory changes were chronic and were taken down carefully with the monopolar hook, the [place].  The patient had a prior ERCP and those images had been reviewed by me before the case.   The cystic duct was then double clipped and divided, the cystic artery was double clipped and divided and then the gallbladder was removed from the gallbladder bed carefully and without entering it. Removal of the gallbladder from the gallbladder bed was performed with the monopolar cautery.  The gallbladder was then placed in a bag and brought out through one of the trocar sites. The gallbladder bed was inspected and no bleeding or bile leaks were seen.   Laparoscopic visualization was used when closing fascial defects for the extraction trocar sites.   Incisions were injected with Exparel and closed with 4-0 Monocryl and Dermabond on the skin.  Sponge and needle count were correct.  .     The patient was taken to the recovery room in satisfactory condition.  Matt B. Daphine Deutscher, MD, FACS

## 2020-07-18 NOTE — Progress Notes (Signed)
[facility] Progress Note  2 Days Post-Op  Subjective: CC-  No complaints this morning. Denies any abdominal pain, nausea, vomiting. Tolerated full liquids yesterday. She remains on 2L supplemental O2. Denies CP or SOB. No cough. States that she has no wheezing at rest, but has not gotten out of bed since yesterday morning. Yesterday she had some SOB and wheezing with ambulation.  Objective: Vital signs in last 24 hours: Temp:  [97.5 F (36.4 C)-98.4 F (36.9 C)] 98.4 F (36.9 C) (05/27 0412) Pulse Rate:  [57-67] 58 (05/27 0412) Resp:  [16] 16 (05/27 0412) BP: (138-154)/(61-66) 154/66 (05/27 0412) SpO2:  [94 %-97 %] 94 % (05/27 0412) Weight:  [92.4 kg] 92.4 kg (05/26 1304) Last BM Date:  (PTA)  Intake/Output from previous day: 05/26 0701 - 05/27 0700 In: 780 [P.O.:480; IV Piggyback:300] Out: 1550 [Urine:1550] Intake/Output this shift: No intake/output data recorded.  PE: Gen: Alert, NAD, pleasant Pulm:no wheezing or rhonchi on anterior exam,rate and effort normal on2L supplemental O2 via [place] KZS:WFUX, nondistended, nontender, no masses, hernias, or organomegaly Skin: no rashes noted, warm and dry  Lab Results:  Recent Labs    07/17/20 0404 07/18/20 0326  WBC 7.0 9.7  HGB 11.1* 11.5*  HCT 32.9* 34.4*  PLT 162 192   BMET Recent Labs    07/17/20 0404 07/18/20 0326  NA 133* 136  K 4.5 4.1  CL 100 99  CO2 28 27  GLUCOSE 141* 128*  BUN 24* 27*  CREATININE 1.08* 1.16*  CALCIUM 8.4* 8.5*   PT/INR No results for input(s): LABPROT, INR in the last 72 hours. CMP     Component Value Date/Time   NA 136 07/18/2020 0326   NA 139 06/26/2019 1459   K 4.1 07/18/2020 0326   CL 99 07/18/2020 0326   CO2 27 07/18/2020 0326   GLUCOSE 128 (H) 07/18/2020 0326   BUN 27 (H) 07/18/2020 0326   BUN 20 06/26/2019 1459   CREATININE 1.16 (H) 07/18/2020 0326   CREATININE 1.25 (H) 12/25/2019 1208   CALCIUM 8.5 (L) 07/18/2020 0326   PROT 5.9 (L) 07/18/2020  0326   ALBUMIN 3.0 (L) 07/18/2020 0326   AST 89 (H) 07/18/2020 0326   ALT 212 (H) 07/18/2020 0326   ALKPHOS 117 07/18/2020 0326   BILITOT 1.2 07/18/2020 0326   GFRNONAA 46 (L) 07/18/2020 0326   GFRAA 47 (L) 06/26/2019 1459   Lipase     Component Value Date/Time   LIPASE 83 (H) 07/18/2020 0326       Studies/Results: DG CHEST PORT 1 VIEW  Result Date: 07/17/2020 CLINICAL DATA:  Increasing wheezing for 1 day EXAM: PORTABLE CHEST 1 VIEW COMPARISON:  Film from the previous day. FINDINGS: Cardiac shadow is stable. Lungs are well aerated bilaterally. Some increasing airspace opacity in the right base is noted which may represent some atelectasis or early infiltrate. No bony abnormality is noted. IMPRESSION: Increasing airspace opacity in the right base. No other focal abnormality is noted. Electronically Signed   By: Alcide Clever M.D.   On: 07/17/2020 15:04   DG ERCP BILIARY & PANCREATIC DUCTS  Result Date: 07/16/2020 CLINICAL DATA:  Probable choledocholithiasis EXAM: ERCP TECHNIQUE: Multiple spot images obtained with the fluoroscopic device and submitted for interpretation post-procedure. FLUOROSCOPY TIME:  Fluoroscopy Time:  2 minutes 49 seconds Radiation Exposure Index (if provided by the fluoroscopic device): 49.8 Number of Acquired Spot Images: 0 COMPARISON:  MRCP 07/15/2020 FINDINGS: Nine intraoperative saved images demonstrate a flexible endoscope in the descending  duodenum followed by wire cannulation of the intrahepatic ducts and cholangiogram. Mild biliary ductal dilatation. Subsequent images demonstrate sphincterotomy and balloon sweeping of the common duct. IMPRESSION: ERCP with sphincterotomy and balloon sweeping of the common duct. These images were submitted for radiologic interpretation only. Please see the procedural report for the amount of contrast and the fluoroscopy time utilized. Electronically Signed   By: Malachy Moan M.D.   On: 07/16/2020 11:49     Anti-infectives: Anti-infectives (From admission, onward)   Start     Dose/Rate Route Frequency Ordered Stop   07/17/20 1200  metroNIDAZOLE (FLAGYL) IVPB 500 mg       "And" Linked Group Details   500 mg 100 mL/hr over 60 Minutes Intravenous Every 8 hours 07/17/20 0752     07/17/20 1000  ciprofloxacin (CIPRO) IVPB 400 mg       "And" Linked Group Details   400 mg 200 mL/hr over 60 Minutes Intravenous Every 12 hours 07/17/20 0752     07/16/20 0900  ciprofloxacin (CIPRO) IVPB 400 mg  Status:  Discontinued       "And" Linked Group Details   400 mg 200 mL/hr over 60 Minutes Intravenous Every 24 hours 07/15/20 1326 07/17/20 0752   07/15/20 1415  metroNIDAZOLE (FLAGYL) IVPB 500 mg  Status:  Discontinued       "And" Linked Group Details   500 mg 100 mL/hr over 60 Minutes Intravenous Every 8 hours 07/15/20 1326 07/17/20 0752   07/14/20 2200  ciprofloxacin (CIPRO) IVPB 400 mg  Status:  Discontinued       "And" Linked Group Details   400 mg 200 mL/hr over 60 Minutes Intravenous Every 12 hours 07/14/20 1547 07/15/20 1326   07/14/20 2200  metroNIDAZOLE (FLAGYL) IVPB 500 mg  Status:  Discontinued       "And" Linked Group Details   500 mg 100 mL/hr over 60 Minutes Intravenous Every 8 hours 07/14/20 1547 07/15/20 1326   07/14/20 1645  ciprofloxacin (CIPRO) IVPB 400 mg  Status:  Discontinued       "And" Linked Group Details   400 mg 200 mL/hr over 60 Minutes Intravenous Every 12 hours 07/14/20 1545 07/14/20 1547   07/14/20 1645  metroNIDAZOLE (FLAGYL) IVPB 500 mg  Status:  Discontinued       "And" Linked Group Details   500 mg 100 mL/hr over 60 Minutes Intravenous Every 8 hours 07/14/20 1545 07/14/20 1547   07/14/20 1130  ciprofloxacin (CIPRO) IVPB 400 mg       "And" Linked Group Details   400 mg 200 mL/hr over 60 Minutes Intravenous  Once 07/14/20 1122 07/14/20 1312   07/14/20 1130  metroNIDAZOLE (FLAGYL) IVPB 500 mg       "And" Linked Group Details   500 mg 100 mL/hr over 60 Minutes  Intravenous  Once 07/14/20 1122 07/14/20 2051       Assessment/Plan PAF on eliquis (last dose5/22 in PM) HTN HLD Diastolic CHF- cardiologist Dr. Jens Som, ECHO 06/26/19 EF 60-65%. Cardiology has seen and reports patient is "intermediate risk and no further cardiac work-up recommended." TRH diuresing Recurrent UTIs Hypothyroidism Anxiety/depression Code status DNR AKI - Cr 1.16  Acute pancreatitis, suspect gallstone pancreatitis Possible cholecystitis Elevated LFTs -s/p ERCP 5/25: A filling defect consistent with a stone and sludge was seen on the cholangiogram, the examination was suspicious for choledocholithiasis, a biliary sphincterotomy was performed, the biliary tree was swept and sludge was found - LFTs trending down and Tbili has normalized, Lipase also down 83,  abdominal pain resolved and she is nontender on exam. I think from a pancreatitis standpoint she is ready for cholecystectomy. I discussed her CHF/ volume overload/ hypoxia with Dr. Janee Morn. This does seem improved, but we will get her up and mobilize this morning and monitor oxygen saturations and respiratory status. If stable (ie not desatting or having significant wheezing/ tachypnea) will plan to proceed with robotic assisted cholecystectomy later today. Keep NPO and continue IV antibiotics. Continue to hold eliquis.  ID -cipro/flagyl5/23>> VTE -sq heparin FEN -IVF per TRH,NPO Foley -none Follow up -TBD   LOS: 4 days    Franne Forts, [facility] 07/18/2020, 8:59 AM Please see Amion for pager number during day hours 7:00am-4:30pm

## 2020-07-18 NOTE — Discharge Instructions (Signed)
[facility] ______[facility] °LAPAROSCOPIC SURGERY: POST OP INSTRUCTIONS °Always review your discharge instruction sheet given to you by the facility where your surgery was performed. °IF YOU HAVE DISABILITY OR FAMILY LEAVE FORMS, YOU MUST BRING THEM TO THE OFFICE FOR PROCESSING.   °DO NOT GIVE THEM TO YOUR DOCTOR. ° °1. A prescription for pain medication may be given to you upon discharge.  Take your pain medication as prescribed, if needed.  If narcotic pain medicine is not needed, then you may take acetaminophen (Tylenol) or ibuprofen (Advil) as needed. °2. Take your usually prescribed medications unless otherwise directed. °3. If you need a refill on your pain medication, please contact your pharmacy.  They will contact our office to request authorization. Prescriptions will not be filled after 5pm or on week-ends. °4. You should follow a light diet the first few days after arrival home, such as soup and crackers, etc.  Be sure to include lots of fluids daily. °5. Most patients will experience some swelling and bruising in the area of the incisions.  Ice packs will help.  Swelling and bruising can take several days to resolve.  °6. It is common to experience some constipation if taking pain medication after surgery.  Increasing fluid intake and taking a stool softener (such as Colace) will usually help or prevent this problem from occurring.  A mild laxative (Milk of Magnesia or Miralax) should be taken according to package instructions if there are no bowel movements after 48 hours. °7. Unless discharge instructions indicate otherwise, you may remove your bandages 24-48 hours after surgery, and you may shower at that time.  You may have steri-strips (small skin tapes) in place directly over the incision.  These strips should be left on the skin for 7-10 days.  If your surgeon used skin glue on the incision, you may shower in 24 hours.  The glue will flake off over the next 2-3 weeks.  Any sutures or  staples will be removed at the office during your follow-up visit. °8. ACTIVITIES:  You may resume regular (light) daily activities beginning the next day--such as daily self-care, walking, climbing stairs--gradually increasing activities as tolerated.  You may have sexual intercourse when it is comfortable.  Refrain from any heavy lifting or straining until approved by your doctor. °a. You may drive when you are no longer taking prescription pain medication, you can comfortably wear a seatbelt, and you can safely maneuver your car and apply brakes. °b. RETURN TO WORK:  __________________________________________________________ °9. You should see your doctor in the office for a follow-up appointment approximately 2-3 weeks after your surgery.  Make sure that you call for this appointment within a day or two after you arrive home to insure a convenient appointment time. °10. OTHER INSTRUCTIONS: __________________________________________________________________________________________________________________________ __________________________________________________________________________________________________________________________ °WHEN TO CALL YOUR DOCTOR: °1. Fever over 101.0 °2. Inability to urinate °3. Continued bleeding from incision. °4. Increased pain, redness, or drainage from the incision. °5. Increasing abdominal pain ° °The clinic staff is available to answer your questions during regular business hours.  Please don’t hesitate to call and ask to speak to one of the nurses for clinical concerns.  If you have a medical emergency, go to the nearest emergency room or call 911.  A surgeon from [facility] is always on call at the hospital. °[address], [address], [place], [place]  [address] ? [address] [address], [place], [place]   [address] °[phone] ? [phone] ? FAX [phone] °Web site:   [email or web address] °

## 2020-07-18 NOTE — Progress Notes (Signed)
SATURATION QUALIFICATIONS: (This note is used to comply with regulatory documentation for home oxygen)  Patient Saturations on Room Air at Rest = 91%  Patient Saturations on Room Air while Ambulating = 83%  Patient Saturations on 2 Liters of oxygen while Ambulating = 95%  Please briefly explain why patient needs home oxygen: desaturating with ambulation

## 2020-07-18 NOTE — Progress Notes (Signed)
PROGRESS NOTE    Alyssa Williamson  YNW:[id number] DOB: Oct 13, 1931 DOA: 07/14/2020 PCP: Bradd Canary, MD    Chief Complaint  Patient presents with  . Emesis    Brief Narrative:  87 year old female with past medical history of atrial fibrillation on Eliquis, hypertension, hyperlipidemia, hypothyroidism, chronic diastolic CHFwho presented to [facility] with complaints of nausea, vomiting and right-sided abdominal pain with associated diarrhea for almost 2 weeks. Associated low-grade fever. She was treated with Bactrim for a UTI on 4/18. Saw her PCP on 5/17 when the patient reported her GI symptoms and her PCP initially felt patient had a norovirus infection as her symptoms initially resolved. Unfortunately her symptoms of right flank pain radiating to her lower abdomen with emesis and low-grade fevers recurred,prompting her to go to the ED.  She's been admitted with gallstone pancreatitis.    Assessment & Plan:   Principal Problem:   Cholecystitis Active Problems:   Hypothyroidism   HTN (hypertension)   Hyperlipidemia   Recurrent UTI   Atrial fibrillation (HCC)   Acute on chronic diastolic CHF (congestive heart failure) (HCC)   Acute biliary pancreatitis   AKI (acute kidney injury) (HCC)   Elevated LFTs   Acute lower UTI   Depression   Choledocholithiasis  1 gallstone pancreatitis/choledocholithiasis/acute cholecystitis -Patient presented with right upper quadrant abdominal pain, nausea vomiting noted to have a transaminitis with elevated bilirubin, lipase elevated (1086) transaminitis with AST and ALT is trending down. -Lipase trending down currently at 83 from 147 from 639 from 1086 -Abdominal ultrasound done concerning for acute cholecystitis. -MRCP with intra and extrahepatic biliary duct dilatation concerning for calculus at the ampulla, tiny gallstones and distended gallbladder, no imaging evidence of acute pancreatitis or complication, no gallbladder wall thickening or  pericholecystic fluid. -Patient was on ciprofloxacin/Flagyl. -GI following and patient underwent ERCP with filling defect consistent with a stone and sludge seen on cholangiogram, examination suspicious for choledocholithiasis, biliary sphincterotomy was performed, biliary tree swept and sludge found. -Leukocytosis trending down. -Tolerated clear liquids.  -Clinical improvement. -Currently n.p.o. in anticipation of cholecystectomy to be done today. -Continue empiric IV ciprofloxacin and Flagyl. -GI was following but have signed off. -General surgery following and appreciate their input and recommendations.  2.  Acute on chronic diastolic CHF -Patient with volume overload on examination on 07/17/2020 with audible wheezing, shortness of breath on exertion, some lower extremity edema.   -BNP noted to be elevated at 673.7 and trended down to 384.6 today.  -2D echo done with EF 55 to 60%, grade 2 diastolic dysfunction -IV fluids have been saline locked. -On Lasix 20 mg IV every 12 hours.  Patient received 3 doses of Lasix 20 mg IV yesterday with a urine output of 1.550 L over the past 24 hours.   -Chest x-ray concerning for volume overload.  -Weight pending for this morning.   -We will give 1 more dose of IV Lasix at 2 PM and resume home regimen of oral Lasix tomorrow. -Strict I's and O's. -Daily weights.  3.  Acute kidney injury -Creatinine trending up but went back down.  Slight bump in creatinine after being on IV Lasix. -Renal ultrasound negative for hydronephrosis. -IV fluids have been saline locked due to volume overload.   -On IV Lasix twice daily we will discontinue IV Lasix after 2 p.m. dose. -Follow.  4.  Chronic atrial fibrillation -Continue verapamil for rate control.   -Eliquis on hold in anticipation of cholecystectomy during this hospitalization.  -General surgery to advise  when anticoagulation may be resumed postop.  5.  Hypertension -Continue verapamil.  On IV Lasix.    6.  Hyperlipidemia -Statin on hold secondary to transaminitis. -Likely resume in the outpatient setting once transaminitis has resolved.  7.  Hypothyroidism -Synthroid.    8.  Asymptomatic UTI -Urine cultures with no growth to date.   -On IV ciprofloxacin secondary to problem #1.  9.  Depression/anxiety -Celexa on hold likely resume on discharge.   -Continue Xanax as needed.     DVT prophylaxis: Heparin Code Status: DNR Family Communication: Updated patient and daughter at bedside. Disposition: Likely home when clinically improved and cleared by general surgery over the next 1 to 2 days.  Status is: Inpatient    Dispo: The patient is from: Home              Anticipated d/c is to: Home              Patient currently with acute pancreatitis, status post ERCP with choledocholithiasis, needing inpatient cholecystectomy.  Patient also volume overloaded on examination.  Not stable for discharge.   Difficult to place patient no       Consultants:   Cardiology: Dr. Bjorn Pippin 07/15/2020  Gastroenterology: Dr. Matthias Hughs 07/14/2020  General surgery: Dr. Daphine Deutscher 07/15/2020  Antimicrobials:   IV ciprofloxacin 07/14/2020>>>>  IV Flagyl 07/14/2020>>>>   Procedures:   Chest x-ray 07/15/2020, 07/16/2020, 07/17/2020  MRCP 07/15/2020  Renal ultrasound 07/15/2020  Abdominal ultrasound 07/14/2020  ERCP per Dr. Marca Ancona 07/16/2020   Subjective: Patient laying in bed.  Denies any chest pain.  Patient states SOB and wheezing have significantly improved from yesterday.  States a significant urine output since being placed on IV Lasix.  Feeling much better.  Denies any abdominal pain.  No nausea, no vomiting.  A little bit anxious about cholecystectomy today.   Objective: Vitals:   07/17/20 1304 07/17/20 1843 07/17/20 2007 07/18/20 0412  BP:  138/65 (!) 142/61 (!) 154/66  Pulse:   (!) 57 (!) 58  Resp:   16 16  Temp:   (!) 97.5 F (36.4 C) 98.4 F (36.9 C)  TempSrc:   Oral Oral   SpO2:   97% 94%  Weight: 92.4 kg     Height:        Intake/Output Summary (Last 24 hours) at 07/18/2020 0908 Last data filed at 07/17/2020 2200 Gross per 24 hour  Intake 780 ml  Output 1550 ml  Net -770 ml   Filed Weights   07/16/20 0500 07/16/20 0940 07/17/20 1304  Weight: 92.1 kg 83.9 kg 92.4 kg    Examination:  General exam: NAD Respiratory system: Minimal expiratory wheezing.  No crackles noted.  Fair air movement.  Speaking in full sentences.  Cardiovascular system: RRR no murmurs rubs or gallops.  No JVD.  No lower extremity edema.  Gastrointestinal system: Abdomen is soft, nontender, nondistended, positive bowel sounds.  No rebound.  No guarding.  Central nervous system: Alert and oriented. No focal neurological deficits. Extremities: Symmetric 5 x 5 power. Skin: No rashes, lesions or ulcers Psychiatry: Judgement and insight appear normal. Mood & affect appropriate.     Data Reviewed: I have personally reviewed following labs and imaging studies  CBC: Recent Labs  Lab 07/14/20 0837 07/15/20 0407 07/16/20 0412 07/17/20 0404 07/18/20 0326  WBC 12.9* 20.2* 14.8* 7.0 9.7  NEUTROABS  --   --  12.4*  --   --   HGB 13.6 12.6 12.1 11.1* 11.5*  HCT 39.4 37.9 37.0  32.9* 34.4*  MCV 95.4 99.7 101.4* 97.6 99.1  PLT 201 195 226 162 192    Basic Metabolic Panel: Recent Labs  Lab 07/15/20 0407 07/15/20 1750 07/16/20 0412 07/17/20 0404 07/18/20 0326  NA 135 133* 134* 133* 136  K 4.0 4.2 4.6 4.5 4.1  CL 101 97* 99 100 99  CO2 GLUCOSE 91 101* 82 141* 128*  BUN 15 26* 26* 24* 27*  CREATININE 1.37* 1.63* 1.64* 1.08* 1.16*  CALCIUM 8.2* 8.1* 8.3* 8.4* 8.5*  MG  --   --  2.0  --  2.1  PHOS  --   --  3.9  --   --     GFR: Estimated Creatinine Clearance: 36.1 mL/min (A) (by C-G formula based on SCr of 1.16 mg/dL (H)).  Liver Function Tests: Recent Labs  Lab 07/14/20 0837 07/15/20 0407 07/16/20 0412 07/17/20 0404 07/18/20 0326  AST 636* 569*  301* 149* 89*  ALT 383* 578* 394* 268* 212*  ALKPHOS 119 133* 141* 128* 117  BILITOT 1.9* 6.0* 3.2* 1.6* 1.2  PROT 6.7 5.9* 6.0* 5.9* 5.9*  ALBUMIN 3.2* 2.9* 3.1* 2.8* 3.0*    CBG: No results for input(s): GLUCAP in the last 168 hours.   Recent Results (from the past 240 hour(s))  Resp Panel by RT-PCR (Flu A&B, Covid) Nasopharyngeal Swab     Status: None   Collection Time: 07/14/20 10:39 AM   Specimen: Nasopharyngeal Swab; Nasopharyngeal(NP) swabs in vial transport medium  Result Value Ref Range Status   SARS Coronavirus 2 by RT PCR NEGATIVE NEGATIVE Final    Comment: (NOTE) SARS-CoV-2 target nucleic acids are NOT DETECTED.  The SARS-CoV-2 RNA is generally detectable in upper respiratory specimens during the acute phase of infection. The lowest concentration of SARS-CoV-2 viral copies this assay can detect is 138 copies/mL. A negative result does not preclude SARS-Cov-2 infection and should not be used as the sole basis for treatment or other patient management decisions. A negative result may occur with  improper specimen collection/handling, submission of specimen other than nasopharyngeal swab, presence of viral mutation(s) within the areas targeted by this assay, and inadequate number of viral copies(<138 copies/mL). A negative result must be combined with clinical observations, patient history, and epidemiological information. The expected result is Negative.  Fact Sheet for Patients:  BloggerCourse.com  Fact Sheet for Healthcare Providers:  SeriousBroker.it  This test is no t yet approved or cleared by the [place] FDA and  has been authorized for detection and/or diagnosis of SARS-CoV-2 by FDA under an Emergency Use Authorization (EUA). This EUA will remain  in effect (meaning this test can be used) for the duration of the COVID-19 declaration under Section 564(b)(1) of the Act, 21 U.S.C.section 360bbb-3(b)(1),  unless the authorization is terminated  or revoked sooner.       Influenza A by PCR NEGATIVE NEGATIVE Final   Influenza B by PCR NEGATIVE NEGATIVE Final    Comment: (NOTE) The Xpert Xpress SARS-CoV-2/FLU/RSV plus assay is intended as an aid in the diagnosis of influenza from Nasopharyngeal swab specimens and should not be used as a sole basis for treatment. Nasal washings and aspirates are unacceptable for Xpert Xpress SARS-CoV-2/FLU/RSV testing.  Fact Sheet for Patients: BloggerCourse.com  Fact Sheet for Healthcare Providers: SeriousBroker.it  This test is not yet approved or cleared by the [place] FDA and has been authorized for detection and/or diagnosis of SARS-CoV-2 by FDA under an Emergency Use Authorization (EUA). This EUA  will remain in effect (meaning this test can be used) for the duration of the COVID-19 declaration under Section 564(b)(1) of the Act, 21 U.S.C. section 360bbb-3(b)(1), unless the authorization is terminated or revoked.  Performed at [facility], [address] [place], [place] [address]   Culture, Urine     Status: None   Collection Time: 07/15/20  9:30 PM   Specimen: Urine, Clean Catch  Result Value Ref Range Status   Specimen Description   Final    URINE, CLEAN CATCH Performed at [facility], [address], [place], [place] [address]    Special Requests   Final    NONE Performed at [facility], [address], [place], [place] [address]    Culture   Final    NO GROWTH Performed at [facility], [address], [place], [place] [address]    Report Status 07/17/2020 FINAL  Final         Radiology Studies: DG CHEST PORT 1 VIEW  Result Date: 07/17/2020 CLINICAL DATA:  Increasing wheezing for 1 day EXAM: PORTABLE CHEST 1 VIEW COMPARISON:  Film from the previous day. FINDINGS: Cardiac shadow is stable. Lungs are well aerated  bilaterally. Some increasing airspace opacity in the right base is noted which may represent some atelectasis or early infiltrate. No bony abnormality is noted. IMPRESSION: Increasing airspace opacity in the right base. No other focal abnormality is noted. Electronically Signed   By: Alcide Clever M.D.   On: 07/17/2020 15:04   DG ERCP BILIARY & PANCREATIC DUCTS  Result Date: 07/16/2020 CLINICAL DATA:  Probable choledocholithiasis EXAM: ERCP TECHNIQUE: Multiple spot images obtained with the fluoroscopic device and submitted for interpretation post-procedure. FLUOROSCOPY TIME:  Fluoroscopy Time:  2 minutes 49 seconds Radiation Exposure Index (if provided by the fluoroscopic device): 49.8 Number of Acquired Spot Images: 0 COMPARISON:  MRCP 07/15/2020 FINDINGS: Nine intraoperative saved images demonstrate a flexible endoscope in the descending duodenum followed by wire cannulation of the intrahepatic ducts and cholangiogram. Mild biliary ductal dilatation. Subsequent images demonstrate sphincterotomy and balloon sweeping of the common duct. IMPRESSION: ERCP with sphincterotomy and balloon sweeping of the common duct. These images were submitted for radiologic interpretation only. Please see the procedural report for the amount of contrast and the fluoroscopy time utilized. Electronically Signed   By: Malachy Moan M.D.   On: 07/16/2020 11:49        Scheduled Meds: . Chlorhexidine Gluconate Cloth  6 each Topical Once  . feeding supplement  1 Container Oral BID BM  . fluticasone furoate-vilanterol  1 puff Inhalation Daily  . furosemide  20 mg Intravenous Q12H  . furosemide  20 mg Intravenous Once  . heparin  5,000 Units Subcutaneous Q8H  . indocyanine green  7.5 mg Intravenous On Call to OR  . indomethacin  100 mg Rectal Once  . ipratropium  0.5 mg Nebulization Q6H  . levalbuterol  0.63 mg Nebulization Q6H  . levothyroxine  75 mcg Oral QAC breakfast  . sodium chloride flush  10-40 mL  Intracatheter Q12H  . verapamil  240 mg Oral Daily   Continuous Infusions: . ciprofloxacin 400 mg (07/17/20 2141)   And  . metronidazole 500 mg (07/18/20 0519)     LOS: 4 days    Time spent: 40 minutes    Ramiro Harvest, MD [facility]   To contact the attending provider between 7A-7P or the covering provider during after hours 7P-7A, please log  into the web site [email or web address] and access using universal [place] password for that web site. If you do not have the password, please call the hospital operator.  07/18/2020, 9:08 AM

## 2020-07-18 NOTE — Anesthesia Preprocedure Evaluation (Addendum)
Anesthesia Evaluation  Patient identified by MRN, date of birth, ID band Patient awake    Reviewed: Allergy & Precautions, Patient's Chart, lab work & pertinent test results  History of Anesthesia Complications (+) PONV and history of anesthetic complications  Airway Mallampati: I  TM Distance: >3 FB Neck ROM: Full    Dental  (+) Edentulous Upper, Edentulous Lower, Dental Advisory Given   Pulmonary neg pulmonary ROS,    Pulmonary exam normal        Cardiovascular hypertension, Pt. on medications Normal cardiovascular exam+ dysrhythmias   IMPRESSIONS    1. Left ventricular ejection fraction, by estimation, is 55 to 60%. The  left ventricle has normal function. The left ventricle has no regional  wall motion abnormalities. Left ventricular diastolic parameters are  consistent with Grade II diastolic  dysfunction (pseudonormalization).  2. Right ventricular systolic function is normal. The right ventricular  size is mildly enlarged. There is mildly elevated pulmonary artery  systolic pressure. The estimated right ventricular systolic pressure is  37.1 mmHg.  3. Right atrial size was mildly dilated.  4. The mitral valve is normal in structure. Mild mitral valve  regurgitation. No evidence of mitral stenosis.  5. The aortic valve is normal in structure. There is mild calcification  of the aortic valve. There is mild thickening of the aortic valve. Aortic  valve regurgitation is not visualized. Mild aortic valve sclerosis is  present, with no evidence of aortic  valve stenosis.  6. The inferior vena cava is normal in size with greater than 50%  respiratory variability, suggesting right atrial pressure of 3 mmHg.    Neuro/Psych PSYCHIATRIC DISORDERS Anxiety Depression negative neurological ROS     GI/Hepatic negative GI ROS, Neg liver ROS,   Endo/Other  Hypothyroidism   Renal/GU Renal InsufficiencyRenal disease      Musculoskeletal negative musculoskeletal ROS (+)   Abdominal   Peds  Hematology  (+) anemia ,   Anesthesia Other Findings   Reproductive/Obstetrics                            Lab Results  Component Value Date   WBC 9.7 07/18/2020   HGB 11.5 (L) 07/18/2020   HCT 34.4 (L) 07/18/2020   MCV 99.1 07/18/2020   PLT 192 07/18/2020   Lab Results  Component Value Date   CREATININE 1.16 (H) 07/18/2020   BUN 27 (H) 07/18/2020   NA 136 07/18/2020   K 4.1 07/18/2020   CL 99 07/18/2020   CO2 27 07/18/2020    Anesthesia Physical  Anesthesia Plan  ASA: III  Anesthesia Plan: General   Post-op Pain Management:    Induction: Intravenous  PONV Risk Score and Plan: 4 or greater and Ondansetron, Dexamethasone and Treatment may vary due to age or medical condition  Airway Management Planned: Oral ETT  Additional Equipment:   Intra-op Plan:   Post-operative Plan: Extubation in OR  Informed Consent: I have reviewed the patients History and Physical, chart, labs and discussed the procedure including the risks, benefits and alternatives for the proposed anesthesia with the patient or authorized representative who has indicated his/her understanding and acceptance.   Patient has DNR.  Discussed DNR with patient and Suspend DNR.   Dental advisory given  Plan Discussed with: Anesthesiologist and CRNA  Anesthesia Plan Comments:        Anesthesia Quick Evaluation

## 2020-07-18 NOTE — H&P (View-Only) (Signed)
[facility] Progress Note  2 Days Post-Op  Subjective: CC-  No complaints this morning. Denies any abdominal pain, nausea, vomiting. Tolerated full liquids yesterday. She remains on 2L supplemental O2. Denies CP or SOB. No cough. States that she has no wheezing at rest, but has not gotten out of bed since yesterday morning. Yesterday she had some SOB and wheezing with ambulation.  Objective: Vital signs in last 24 hours: Temp:  [97.5 F (36.4 C)-98.4 F (36.9 C)] 98.4 F (36.9 C) (05/27 0412) Pulse Rate:  [57-67] 58 (05/27 0412) Resp:  [16] 16 (05/27 0412) BP: (138-154)/(61-66) 154/66 (05/27 0412) SpO2:  [94 %-97 %] 94 % (05/27 0412) Weight:  [92.4 kg] 92.4 kg (05/26 1304) Last BM Date:  (PTA)  Intake/Output from previous day: 05/26 0701 - 05/27 0700 In: 780 [P.O.:480; IV Piggyback:300] Out: 1550 [Urine:1550] Intake/Output this shift: No intake/output data recorded.  PE: Gen: Alert, NAD, pleasant Pulm:no wheezing or rhonchi on anterior exam,rate and effort normal on2L supplemental O2 via [place] KZS:WFUX, nondistended, nontender, no masses, hernias, or organomegaly Skin: no rashes noted, warm and dry  Lab Results:  Recent Labs    07/17/20 0404 07/18/20 0326  WBC 7.0 9.7  HGB 11.1* 11.5*  HCT 32.9* 34.4*  PLT 162 192   BMET Recent Labs    07/17/20 0404 07/18/20 0326  NA 133* 136  K 4.5 4.1  CL 100 99  CO2 28 27  GLUCOSE 141* 128*  BUN 24* 27*  CREATININE 1.08* 1.16*  CALCIUM 8.4* 8.5*   PT/INR No results for input(s): LABPROT, INR in the last 72 hours. CMP     Component Value Date/Time   NA 136 07/18/2020 0326   NA 139 06/26/2019 1459   K 4.1 07/18/2020 0326   CL 99 07/18/2020 0326   CO2 27 07/18/2020 0326   GLUCOSE 128 (H) 07/18/2020 0326   BUN 27 (H) 07/18/2020 0326   BUN 20 06/26/2019 1459   CREATININE 1.16 (H) 07/18/2020 0326   CREATININE 1.25 (H) 12/25/2019 1208   CALCIUM 8.5 (L) 07/18/2020 0326   PROT 5.9 (L) 07/18/2020  0326   ALBUMIN 3.0 (L) 07/18/2020 0326   AST 89 (H) 07/18/2020 0326   ALT 212 (H) 07/18/2020 0326   ALKPHOS 117 07/18/2020 0326   BILITOT 1.2 07/18/2020 0326   GFRNONAA 46 (L) 07/18/2020 0326   GFRAA 47 (L) 06/26/2019 1459   Lipase     Component Value Date/Time   LIPASE 83 (H) 07/18/2020 0326       Studies/Results: DG CHEST PORT 1 VIEW  Result Date: 07/17/2020 CLINICAL DATA:  Increasing wheezing for 1 day EXAM: PORTABLE CHEST 1 VIEW COMPARISON:  Film from the previous day. FINDINGS: Cardiac shadow is stable. Lungs are well aerated bilaterally. Some increasing airspace opacity in the right base is noted which may represent some atelectasis or early infiltrate. No bony abnormality is noted. IMPRESSION: Increasing airspace opacity in the right base. No other focal abnormality is noted. Electronically Signed   By: Alcide Clever M.D.   On: 07/17/2020 15:04   DG ERCP BILIARY & PANCREATIC DUCTS  Result Date: 07/16/2020 CLINICAL DATA:  Probable choledocholithiasis EXAM: ERCP TECHNIQUE: Multiple spot images obtained with the fluoroscopic device and submitted for interpretation post-procedure. FLUOROSCOPY TIME:  Fluoroscopy Time:  2 minutes 49 seconds Radiation Exposure Index (if provided by the fluoroscopic device): 49.8 Number of Acquired Spot Images: 0 COMPARISON:  MRCP 07/15/2020 FINDINGS: Nine intraoperative saved images demonstrate a flexible endoscope in the descending  duodenum followed by wire cannulation of the intrahepatic ducts and cholangiogram. Mild biliary ductal dilatation. Subsequent images demonstrate sphincterotomy and balloon sweeping of the common duct. IMPRESSION: ERCP with sphincterotomy and balloon sweeping of the common duct. These images were submitted for radiologic interpretation only. Please see the procedural report for the amount of contrast and the fluoroscopy time utilized. Electronically Signed   By: Malachy Moan M.D.   On: 07/16/2020 11:49     Anti-infectives: Anti-infectives (From admission, onward)   Start     Dose/Rate Route Frequency Ordered Stop   07/17/20 1200  metroNIDAZOLE (FLAGYL) IVPB 500 mg       "And" Linked Group Details   500 mg 100 mL/hr over 60 Minutes Intravenous Every 8 hours 07/17/20 0752     07/17/20 1000  ciprofloxacin (CIPRO) IVPB 400 mg       "And" Linked Group Details   400 mg 200 mL/hr over 60 Minutes Intravenous Every 12 hours 07/17/20 0752     07/16/20 0900  ciprofloxacin (CIPRO) IVPB 400 mg  Status:  Discontinued       "And" Linked Group Details   400 mg 200 mL/hr over 60 Minutes Intravenous Every 24 hours 07/15/20 1326 07/17/20 0752   07/15/20 1415  metroNIDAZOLE (FLAGYL) IVPB 500 mg  Status:  Discontinued       "And" Linked Group Details   500 mg 100 mL/hr over 60 Minutes Intravenous Every 8 hours 07/15/20 1326 07/17/20 0752   07/14/20 2200  ciprofloxacin (CIPRO) IVPB 400 mg  Status:  Discontinued       "And" Linked Group Details   400 mg 200 mL/hr over 60 Minutes Intravenous Every 12 hours 07/14/20 1547 07/15/20 1326   07/14/20 2200  metroNIDAZOLE (FLAGYL) IVPB 500 mg  Status:  Discontinued       "And" Linked Group Details   500 mg 100 mL/hr over 60 Minutes Intravenous Every 8 hours 07/14/20 1547 07/15/20 1326   07/14/20 1645  ciprofloxacin (CIPRO) IVPB 400 mg  Status:  Discontinued       "And" Linked Group Details   400 mg 200 mL/hr over 60 Minutes Intravenous Every 12 hours 07/14/20 1545 07/14/20 1547   07/14/20 1645  metroNIDAZOLE (FLAGYL) IVPB 500 mg  Status:  Discontinued       "And" Linked Group Details   500 mg 100 mL/hr over 60 Minutes Intravenous Every 8 hours 07/14/20 1545 07/14/20 1547   07/14/20 1130  ciprofloxacin (CIPRO) IVPB 400 mg       "And" Linked Group Details   400 mg 200 mL/hr over 60 Minutes Intravenous  Once 07/14/20 1122 07/14/20 1312   07/14/20 1130  metroNIDAZOLE (FLAGYL) IVPB 500 mg       "And" Linked Group Details   500 mg 100 mL/hr over 60 Minutes  Intravenous  Once 07/14/20 1122 07/14/20 2051       Assessment/Plan PAF on eliquis (last dose5/22 in PM) HTN HLD Diastolic CHF- cardiologist Dr. Jens Som, ECHO 06/26/19 EF 60-65%. Cardiology has seen and reports patient is "intermediate risk and no further cardiac work-up recommended." TRH diuresing Recurrent UTIs Hypothyroidism Anxiety/depression Code status DNR AKI - Cr 1.16  Acute pancreatitis, suspect gallstone pancreatitis Possible cholecystitis Elevated LFTs -s/p ERCP 5/25: A filling defect consistent with a stone and sludge was seen on the cholangiogram, the examination was suspicious for choledocholithiasis, a biliary sphincterotomy was performed, the biliary tree was swept and sludge was found - LFTs trending down and Tbili has normalized, Lipase also down 83,  abdominal pain resolved and she is nontender on exam. I think from a pancreatitis standpoint she is ready for cholecystectomy. I discussed her CHF/ volume overload/ hypoxia with Dr. Janee Morn. This does seem improved, but we will get her up and mobilize this morning and monitor oxygen saturations and respiratory status. If stable (ie not desatting or having significant wheezing/ tachypnea) will plan to proceed with robotic assisted cholecystectomy later today. Keep NPO and continue IV antibiotics. Continue to hold eliquis.  ID -cipro/flagyl5/23>> VTE -sq heparin FEN -IVF per TRH,NPO Foley -none Follow up -TBD   LOS: 4 days    Franne Forts, [facility] 07/18/2020, 8:59 AM Please see Amion for pager number during day hours 7:00am-4:30pm

## 2020-07-18 NOTE — Anesthesia Procedure Notes (Signed)
Procedure Name: Intubation Date/Time: 07/18/2020 3:02 PM Performed by: Montel Clock, CRNA Pre-anesthesia Checklist: Patient identified, Emergency Drugs available, Suction available, Patient being monitored and Timeout performed Patient Re-evaluated:Patient Re-evaluated prior to induction Oxygen Delivery Method: Circle system utilized Preoxygenation: Pre-oxygenation with 100% oxygen Induction Type: IV induction Ventilation: Mask ventilation without difficulty and Oral airway inserted - appropriate to patient size Laryngoscope Size: Mac and 3 Grade View: Grade I Tube type: Oral Tube size: 7.0 mm Number of attempts: 1 Airway Equipment and Method: Stylet Placement Confirmation: ETT inserted through vocal cords under direct vision,  positive ETCO2 and breath sounds checked- equal and bilateral Secured at: 21 cm Tube secured with: Tape Dental Injury: Teeth and Oropharynx as per pre-operative assessment

## 2020-07-18 NOTE — Transfer of Care (Signed)
Immediate Anesthesia Transfer of Care Note  Patient: Alyssa Williamson  Procedure(s) Performed: XI ROBOT ASSISTED CHOLECYSTECTOMY (N/A )  Patient Location: PACU  Anesthesia Type:General  Level of Consciousness: drowsy and patient cooperative  Airway & Oxygen Therapy: Patient Spontanous Breathing and Patient connected to face mask oxygen  Post-op Assessment: Report given to RN and Post -op Vital signs reviewed and stable  Post vital signs: Reviewed and stable  Last Vitals:  Vitals Value Taken Time  BP 149/81 07/18/20 1740  Temp    Pulse 70 07/18/20 1744  Resp 21 07/18/20 1744  SpO2 96 % 07/18/20 1744  Vitals shown include unvalidated device data.  Last Pain:  Vitals:   07/18/20 1348  TempSrc: Oral  PainSc:       Patients Stated Pain Goal: 2 (07/14/20 2105)  Complications: No complications documented.

## 2020-07-19 DIAGNOSIS — N179 Acute kidney failure, unspecified: Secondary | ICD-10-CM | POA: Diagnosis not present

## 2020-07-19 DIAGNOSIS — I5033 Acute on chronic diastolic (congestive) heart failure: Secondary | ICD-10-CM | POA: Diagnosis not present

## 2020-07-19 DIAGNOSIS — K819 Cholecystitis, unspecified: Secondary | ICD-10-CM | POA: Diagnosis not present

## 2020-07-19 DIAGNOSIS — K851 Biliary acute pancreatitis without necrosis or infection: Secondary | ICD-10-CM | POA: Diagnosis not present

## 2020-07-19 LAB — CBC
HCT: 34.3 % — ABNORMAL LOW (ref 36.0–46.0)
Hemoglobin: 11.6 g/dL — ABNORMAL LOW (ref 12.0–15.0)
MCH: 33.2 pg (ref 26.0–34.0)
MCHC: 33.8 g/dL (ref 30.0–36.0)
MCV: 98.3 fL (ref 80.0–100.0)
Platelets: 173 10*3/uL (ref 150–400)
RBC: 3.49 MIL/uL — ABNORMAL LOW (ref 3.87–5.11)
RDW: 14.3 % (ref 11.5–15.5)
WBC: 6.6 10*3/uL (ref 4.0–10.5)
nRBC: 0 % (ref 0.0–0.2)

## 2020-07-19 LAB — COMPREHENSIVE METABOLIC PANEL
ALT: 154 U/L — ABNORMAL HIGH (ref 0–44)
AST: 62 U/L — ABNORMAL HIGH (ref 15–41)
Albumin: 2.8 g/dL — ABNORMAL LOW (ref 3.5–5.0)
Alkaline Phosphatase: 99 U/L (ref 38–126)
Anion gap: 10 (ref 5–15)
BUN: 23 mg/dL (ref 8–23)
CO2: 29 mmol/L (ref 22–32)
Calcium: 8.1 mg/dL — ABNORMAL LOW (ref 8.9–10.3)
Chloride: 97 mmol/L — ABNORMAL LOW (ref 98–111)
Creatinine, Ser: 1.14 mg/dL — ABNORMAL HIGH (ref 0.44–1.00)
GFR, Estimated: 47 mL/min — ABNORMAL LOW (ref >60)
Glucose, Bld: 166 mg/dL — ABNORMAL HIGH (ref 70–99)
Potassium: 4 mmol/L (ref 3.5–5.1)
Sodium: 136 mmol/L (ref 135–145)
Total Bilirubin: 1.3 mg/dL — ABNORMAL HIGH (ref 0.3–1.2)
Total Protein: 6.1 g/dL — ABNORMAL LOW (ref 6.5–8.1)

## 2020-07-19 LAB — MAGNESIUM: Magnesium: 1.9 mg/dL (ref 1.7–2.4)

## 2020-07-19 LAB — LIPASE, BLOOD: Lipase: 39 U/L (ref 11–51)

## 2020-07-19 MED ORDER — FUROSEMIDE 20 MG PO TABS
20.0000 mg | ORAL_TABLET | Freq: Every day | ORAL | Status: DC
  Administered 2020-07-20: 20 mg via ORAL
  Filled 2020-07-19: qty 1

## 2020-07-19 MED ORDER — IPRATROPIUM BROMIDE 0.02 % IN SOLN
0.5000 mg | Freq: Two times a day (BID) | RESPIRATORY_TRACT | Status: DC
  Administered 2020-07-19 – 2020-07-20 (×2): 0.5 mg via RESPIRATORY_TRACT
  Filled 2020-07-19 (×2): qty 2.5

## 2020-07-19 MED ORDER — CHLORHEXIDINE GLUCONATE CLOTH 2 % EX PADS
6.0000 | MEDICATED_PAD | Freq: Every day | CUTANEOUS | Status: DC
  Administered 2020-07-19 – 2020-07-20 (×2): 6 via TOPICAL

## 2020-07-19 MED ORDER — APIXABAN 5 MG PO TABS
5.0000 mg | ORAL_TABLET | Freq: Two times a day (BID) | ORAL | Status: DC
  Administered 2020-07-20: 5 mg via ORAL
  Filled 2020-07-19 (×2): qty 1

## 2020-07-19 MED ORDER — LEVALBUTEROL HCL 0.63 MG/3ML IN NEBU
0.6300 mg | INHALATION_SOLUTION | Freq: Two times a day (BID) | RESPIRATORY_TRACT | Status: DC
  Administered 2020-07-19 – 2020-07-20 (×2): 0.63 mg via RESPIRATORY_TRACT
  Filled 2020-07-19 (×2): qty 3

## 2020-07-19 MED ORDER — FUROSEMIDE 10 MG/ML IJ SOLN
20.0000 mg | Freq: Once | INTRAMUSCULAR | Status: AC
  Administered 2020-07-19: 20 mg via INTRAVENOUS
  Filled 2020-07-19: qty 2

## 2020-07-19 MED ORDER — FUROSEMIDE 20 MG PO TABS: 20.0000 mg | ORAL_TABLET | Freq: Every day | ORAL | Status: DC

## 2020-07-19 NOTE — Progress Notes (Signed)
1 Day Post-Op   Subjective/Chief Complaint: Patient reports some soreness, but no nausea or vomiting LFT's lipase improving Eager to go home   Objective: Vital signs in last 24 hours: Temp:  [98.2 F (36.8 C)-98.9 F (37.2 C)] 98.2 F (36.8 C) (05/28 1027) Pulse Rate:  [54-95] 54 (05/28 0633) Resp:  [12-24] 18 (05/28 2536) BP: (129-163)/(55-81) 149/58 (05/28 0633) SpO2:  [91 %-100 %] 97 % (05/28 0726) Weight:  [89.8 kg-91.3 kg] 89.8 kg (05/28 0633) Last BM Date: 07/14/20  Intake/Output from previous day: 05/27 0701 - 05/28 0700 In: 1134.5 [I.V.:500; IV Piggyback:634.5] Out: 3510 [Urine:3500; Blood:10] Intake/Output this shift: No intake/output data recorded.  General appearance: alert, cooperative and no distress GI: soft, minimal tenderness; ecchymosis at all port sites and across lower abdomen  Lab Results:  Recent Labs    07/18/20 0326 07/19/20 0415  WBC 9.7 6.6  HGB 11.5* 11.6*  HCT 34.4* 34.3*  PLT 192 173   BMET Recent Labs    07/18/20 0326 07/19/20 0415  NA 136 136  K 4.1 4.0  CL 99 97*  CO2 27 29  GLUCOSE 128* 166*  BUN 27* 23  CREATININE 1.16* 1.14*  CALCIUM 8.5* 8.1*   Hepatic Function Latest Ref Rng & Units 07/19/2020 07/18/2020 07/17/2020  Total Protein 6.5 - 8.1 g/dL 6.1(L) 5.9(L) 5.9(L)  Albumin 3.5 - 5.0 g/dL 2.8(L) 3.0(L) 2.8(L)  AST 15 - 41 U/L 62(H) 89(H) 149(H)  ALT 0 - 44 U/L 154(H) 212(H) 268(H)  Alk Phosphatase 38 - 126 U/L 99 117 128(H)  Total Bilirubin 0.3 - 1.2 mg/dL 1.3(H) 1.2 1.6(H)  Bilirubin, Direct 0.0 - 0.3 mg/dL - - -   Lipase     Component Value Date/Time   LIPASE 39 07/19/2020 0415    PT/INR No results for input(s): LABPROT, INR in the last 72 hours. ABG No results for input(s): PHART, HCO3 in the last 72 hours.  Invalid input(s): PCO2, PO2  Studies/Results: DG CHEST PORT 1 VIEW  Result Date: 07/17/2020 CLINICAL DATA:  Increasing wheezing for 1 day EXAM: PORTABLE CHEST 1 VIEW COMPARISON:  Film from the  previous day. FINDINGS: Cardiac shadow is stable. Lungs are well aerated bilaterally. Some increasing airspace opacity in the right base is noted which may represent some atelectasis or early infiltrate. No bony abnormality is noted. IMPRESSION: Increasing airspace opacity in the right base. No other focal abnormality is noted. Electronically Signed   By: Inez Catalina M.D.   On: 07/17/2020 15:04    Anti-infectives: Anti-infectives (From admission, onward)   Start     Dose/Rate Route Frequency Ordered Stop   07/17/20 1200  metroNIDAZOLE (FLAGYL) IVPB 500 mg       "And" Linked Group Details   500 mg 100 mL/hr over 60 Minutes Intravenous Every 8 hours 07/17/20 0752     07/17/20 1000  ciprofloxacin (CIPRO) IVPB 400 mg       "And" Linked Group Details   400 mg 200 mL/hr over 60 Minutes Intravenous Every 12 hours 07/17/20 0752     07/16/20 0900  ciprofloxacin (CIPRO) IVPB 400 mg  Status:  Discontinued       "And" Linked Group Details   400 mg 200 mL/hr over 60 Minutes Intravenous Every 24 hours 07/15/20 1326 07/17/20 0752   07/15/20 1415  metroNIDAZOLE (FLAGYL) IVPB 500 mg  Status:  Discontinued       "And" Linked Group Details   500 mg 100 mL/hr over 60 Minutes Intravenous Every 8 hours 07/15/20  1326 07/17/20 0752   07/14/20 2200  ciprofloxacin (CIPRO) IVPB 400 mg  Status:  Discontinued       "And" Linked Group Details   400 mg 200 mL/hr over 60 Minutes Intravenous Every 12 hours 07/14/20 1547 07/15/20 1326   07/14/20 2200  metroNIDAZOLE (FLAGYL) IVPB 500 mg  Status:  Discontinued       "And" Linked Group Details   500 mg 100 mL/hr over 60 Minutes Intravenous Every 8 hours 07/14/20 1547 07/15/20 1326   07/14/20 1645  ciprofloxacin (CIPRO) IVPB 400 mg  Status:  Discontinued       "And" Linked Group Details   400 mg 200 mL/hr over 60 Minutes Intravenous Every 12 hours 07/14/20 1545 07/14/20 1547   07/14/20 1645  metroNIDAZOLE (FLAGYL) IVPB 500 mg  Status:  Discontinued       "And" Linked  Group Details   500 mg 100 mL/hr over 60 Minutes Intravenous Every 8 hours 07/14/20 1545 07/14/20 1547   07/14/20 1130  ciprofloxacin (CIPRO) IVPB 400 mg       "And" Linked Group Details   400 mg 200 mL/hr over 60 Minutes Intravenous  Once 07/14/20 1122 07/14/20 1312   07/14/20 1130  metroNIDAZOLE (FLAGYL) IVPB 500 mg       "And" Linked Group Details   500 mg 100 mL/hr over 60 Minutes Intravenous  Once 07/14/20 1122 07/14/20 2051      Assessment/Plan: PAF on eliquis (last dose5/22 in PM) HTN HLD Diastolic CHF- cardiologist Dr. Stanford Breed, ECHO 06/26/19 EF 60-65%. Cardiology has seen and reports patient is"intermediate risk and no further cardiac work-up recommended." TRH diuresing Recurrent UTIs Hypothyroidism Anxiety/depression Code status DNR AKI - Cr 1.16  Acute pancreatitis, suspect gallstone pancreatitis Possible cholecystitis Elevated LFTs -s/p ERCP5/25:A filling defect consistent with a stone and sludge was seen on the cholangiogram, the examination was suspicious for choledocholithiasis, abiliary sphincterotomy was performed, the biliary tree was swept and sludge was found - LFTs trending down and Tbili has normalized, Lipase also down 83, abdominal pain resolved and she is nontender on exam. I think from a pancreatitis standpoint she is ready for cholecystectomy. I discussed her CHF/ volume overload/ hypoxia with Dr. Grandville Silos.  S/p robotic cholecystectomy by Dr. Hassell Done 5/27. Doing well POD #1 May restart Eliquis this evening.  ID -cipro/flagyl5/23>> VTE -Resume sq heparin FEN -IVF per TRH,lowfat diet Foley -none Follow up -[facility]  LOS: 5 days    Alyssa Williamson 07/19/2020

## 2020-07-19 NOTE — Progress Notes (Signed)
SATURATION QUALIFICATIONS: (This note is used to comply with regulatory documentation for home oxygen)  Patient Saturations on Room Air at Rest = 95%  Patient Saturations on Room Air while Ambulating = 92%  Patient Saturations on 0 Liters of oxygen while Ambulating = 92%  Please briefly explain why patient needs home oxygen: O2 not needed

## 2020-07-19 NOTE — Plan of Care (Signed)

## 2020-07-19 NOTE — Progress Notes (Signed)
PROGRESS NOTE    Alyssa Williamson  ZOX:[id number] DOB: 1931-12-02 DOA: 07/14/2020 PCP: Bradd CanaryBlyth, Stacey A, MD    Chief Complaint  Patient presents with  . Emesis    Brief Narrative:  87 year old female with past medical history of atrial fibrillation on Eliquis, hypertension, hyperlipidemia, hypothyroidism, chronic diastolic CHFwho presented [facility] with complaints of nausea, vomiting and right-sided abdominal pain with associated diarrhea for almost 2 weeks. Associated low-grade fever. She was treated with Bactrim for a UTI on 4/18. Saw her PCP on 5/17 when the patient reported her GI symptoms and her PCP initially felt patient had a norovirus infection as her symptoms initially resolved. Unfortunately her symptoms of right flank pain radiating to her lower abdomen with emesis and low-grade fevers recurred,prompting her to go to the ED.  She's been admitted with gallstone pancreatitis.    Assessment & Plan:   Principal Problem:   Cholecystitis Active Problems:   Hypothyroidism   HTN (hypertension)   Hyperlipidemia   Recurrent UTI   Atrial fibrillation (HCC)   Acute on chronic diastolic CHF (congestive heart failure) (HCC)   Acute biliary pancreatitis   AKI (acute kidney injury) (HCC)   Elevated LFTs   Acute lower UTI   Depression   Choledocholithiasis   S/P cholecystectomy-Xi robot  1 gallstone pancreatitis/choledocholithiasis/acute cholecystitis -Patient presented with right upper quadrant abdominal pain, nausea vomiting noted to have a transaminitis with elevated bilirubin, lipase elevated (1086) transaminitis with AST and ALT is trending down. -Lipase trending down currently at 39 from 83 from 147 from 639 from 1086 -Abdominal ultrasound done concerning for acute cholecystitis. -MRCP with intra and extrahepatic biliary duct dilatation concerning for calculus at the ampulla, tiny gallstones and distended gallbladder, no imaging evidence of acute pancreatitis or  complication, no gallbladder wall thickening or pericholecystic fluid. -Patient was on ciprofloxacin/Flagyl. -GI following and patient underwent ERCP with filling defect consistent with a stone and sludge seen on cholangiogram, examination suspicious for choledocholithiasis, biliary sphincterotomy was performed, biliary tree swept and sludge found. -Leukocytosis trended down. -Status post robotic cholecystectomy 07/18/2020.   -Tolerated clear liquids diet has been advanced to a soft diet today per general surgery.   -Continue IV antibiotics through today and subsequently discontinue.   -GI was following but have signed off.   -General surgery following and appreciate input and recommendations.   2.  Acute on chronic diastolic CHF -Patient with volume overload on examination on 07/17/2020 with audible wheezing, shortness of breath on exertion, some lower extremity edema.   -BNP noted to be elevated at 673.7 and trended down to 384.6.  -2D echo done with EF 55 to 60%, grade 2 diastolic dysfunction -IV fluids have been saline locked. -Current weight of 197.97 pounds. -On Lasix 20 mg IV every 12 hours. -Patient with good urine output of 3.5 L over the past 24 hours.   -Chest x-ray done was concerning for volume overload.   -Patient with some trace to 1 bilateral lower extremity edema however overall has clinically improved and currently with sats greater than 92% on room air.   -We will give a dose of Lasix 20 mg IV x1 this afternoon and transition back to home dose oral Lasix tomorrow.   -Strict I's and O's.   -Daily weights.    3.  Acute kidney injury -Creatinine trending up but went back down.   -Patient noted to have a slight bump in creatinine while on IV Lasix.   -Renal ultrasound negative for hydronephrosis  -IV  fluids saline lock.   -We will give 1 more dose of IV Lasix 20 mg x 1 today and transition back to home dose oral Lasix  -Monitor renal function.   4.  Chronic atrial  fibrillation -Continue verapamil for rate control.  -Eliquis held in anticipation of cholecystectomy which was done 07/18/2020 . -General surgery recommending resumption of Eliquis this evening.   5.  Hypertension -Continue verapamil.  Transition from IV Lasix back to home dose oral Lasix.    6.  Hyperlipidemia -Statin on hold secondary to transaminitis. -Likely resume in the outpatient setting once transaminitis has resolved and follow-up with PCP.  7.  Hypothyroidism -Continue Synthroid.  8.  Asymptomatic UTI -Urine cultures with no growth to date.   -On IV ciprofloxacin secondary to problem #1.  9.  Depression/anxiety -Xanax as needed.   -Celexa on hold likely resume on discharge.      DVT prophylaxis: Heparin Code Status: DNR Family Communication: Updated patient and grandson at bedside. Disposition: Likely home when clinically improved and cleared by general surgery hopefully in the next 24 hours.   Status is: Inpatient    Dispo: The patient is from: Home              Anticipated d/c is to: Home              Patient currently with acute pancreatitis, status post ERCP with choledocholithiasis, status postcholecystectomy, volume overloaded and improving clinically.  Not stable for discharge.    Difficult to place patient no       Consultants:   Cardiology: Dr. Bjorn Pippin 07/15/2020  Gastroenterology: Dr. Matthias Hughs 07/14/2020  General surgery: Dr. Daphine Deutscher 07/15/2020  Antimicrobials:   IV ciprofloxacin 07/14/2020>>>>  IV Flagyl 07/14/2020>>>>   Procedures:   Chest x-ray 07/15/2020, 07/16/2020, 07/17/2020  MRCP 07/15/2020  Renal ultrasound 07/15/2020  Abdominal ultrasound 07/14/2020  ERCP per Dr. Marca Ancona 07/16/2020  Robotic cholecystectomy per Dr. Daphine Deutscher 07/18/2020   Subjective: Laying in bed.  No chest pain.  No shortness of breath.  Feels significantly better.  No shortness of breath.  No further wheezing.  Denies any abdominal pain.  Tolerated procedure  yesterday well.  Has just been advanced to a soft diet.  Grandson at bedside.   Objective: Vitals:   07/18/20 1936 07/18/20 2305 07/19/20 0633 07/19/20 0726  BP:  (!) 129/55 (!) 149/58   Pulse:  (!) 56 (!) 54   Resp:  20 18   Temp:  98.7 F (37.1 C) 98.2 F (36.8 C)   TempSrc:  Oral Oral   SpO2: 96% 95% 100% 97%  Weight:   89.8 kg   Height:        Intake/Output Summary (Last 24 hours) at 07/19/2020 1309 Last data filed at 07/19/2020 1151 Gross per 24 hour  Intake 1134.49 ml  Output 3410 ml  Net -2275.51 ml   Filed Weights   07/17/20 1304 07/18/20 0944 07/19/20 0633  Weight: 92.4 kg 91.3 kg 89.8 kg    Examination:  General exam: NAD Respiratory system: Some decreased breath sounds in the bases.  No wheezing noted.  No crackles.  Fair air movement.  Speaking in full sentences.  Cardiovascular system: Regular rate and rhythm no murmurs rubs or gallops.  No JVD.  Trace to 1+ bilateral lower extremity edema. Gastrointestinal system: Abdomen is soft, nontender, nondistended, positive bowel sounds.  No rebound.  No guarding. Central nervous system: Alert and oriented.  No focal neurological deficits. Extremities: Symmetric 5 x 5 power. Skin: No  rashes, lesions or ulcers Psychiatry: Judgement and insight appear normal. Mood & affect appropriate.     Data Reviewed: I have personally reviewed following labs and imaging studies  CBC: Recent Labs  Lab 07/15/20 0407 07/16/20 0412 07/17/20 0404 07/18/20 0326 07/19/20 0415  WBC 20.2* 14.8* 7.0 9.7 6.6  NEUTROABS  --  12.4*  --   --   --   HGB 12.6 12.1 11.1* 11.5* 11.6*  HCT 37.9 37.0 32.9* 34.4* 34.3*  MCV 99.7 101.4* 97.6 99.1 98.3  PLT 195 226 162 192 173    Basic Metabolic Panel: Recent Labs  Lab 07/15/20 1750 07/16/20 0412 07/17/20 0404 07/18/20 0326 07/19/20 0415  NA 133* 134* 133* 136 136  K 4.2 4.6 4.5 4.1 4.0  CL 97* 99 100 99 97*  CO2 27 28 28 27 29   GLUCOSE 101* 82 141* 128* 166*  BUN 26* 26* 24* 27*  23  CREATININE 1.63* 1.64* 1.08* 1.16* 1.14*  CALCIUM 8.1* 8.3* 8.4* 8.5* 8.1*  MG  --  2.0  --  2.1 1.9  PHOS  --  3.9  --   --   --     GFR: Estimated Creatinine Clearance: 36.2 mL/min (A) (by C-G formula based on SCr of 1.14 mg/dL (H)).  Liver Function Tests: Recent Labs  Lab 07/15/20 0407 07/16/20 0412 07/17/20 0404 07/18/20 0326 07/19/20 0415  AST 569* 301* 149* 89* 62*  ALT 578* 394* 268* 212* 154*  ALKPHOS 133* 141* 128* 117 99  BILITOT 6.0* 3.2* 1.6* 1.2 1.3*  PROT 5.9* 6.0* 5.9* 5.9* 6.1*  ALBUMIN 2.9* 3.1* 2.8* 3.0* 2.8*    CBG: No results for input(s): GLUCAP in the last 168 hours.   Recent Results (from the past 240 hour(s))  Resp Panel by RT-PCR (Flu A&B, Covid) Nasopharyngeal Swab     Status: None   Collection Time: 07/14/20 10:39 AM   Specimen: Nasopharyngeal Swab; Nasopharyngeal(NP) swabs in vial transport medium  Result Value Ref Range Status   SARS Coronavirus 2 by RT PCR NEGATIVE NEGATIVE Final    Comment: (NOTE) SARS-CoV-2 target nucleic acids are NOT DETECTED.  The SARS-CoV-2 RNA is generally detectable in upper respiratory specimens during the acute phase of infection. The lowest concentration of SARS-CoV-2 viral copies this assay can detect is 138 copies/mL. A negative result does not preclude SARS-Cov-2 infection and should not be used as the sole basis for treatment or other patient management decisions. A negative result may occur with  improper specimen collection/handling, submission of specimen other than nasopharyngeal swab, presence of viral mutation(s) within the areas targeted by this assay, and inadequate number of viral copies(<138 copies/mL). A negative result must be combined with clinical observations, patient history, and epidemiological information. The expected result is Negative.  Fact Sheet for Patients:  07/16/20  Fact Sheet for Healthcare Providers:   BloggerCourse.com  This test is no t yet approved or cleared by the [place] FDA and  has been authorized for detection and/or diagnosis of SARS-CoV-2 by FDA under an Emergency Use Authorization (EUA). This EUA will remain  in effect (meaning this test can be used) for the duration of the COVID-19 declaration under Section 564(b)(1) of the Act, 21 U.S.C.section 360bbb-3(b)(1), unless the authorization is terminated  or revoked sooner.       Influenza A by PCR NEGATIVE NEGATIVE Final   Influenza B by PCR NEGATIVE NEGATIVE Final    Comment: (NOTE) The Xpert Xpress SARS-CoV-2/FLU/RSV plus assay is intended as an aid in  the diagnosis of influenza from Nasopharyngeal swab specimens and should not be used as a sole basis for treatment. Nasal washings and aspirates are unacceptable for Xpert Xpress SARS-CoV-2/FLU/RSV testing.  Fact Sheet for Patients: BloggerCourse.com  Fact Sheet for Healthcare Providers: SeriousBroker.it  This test is not yet approved or cleared by the [place] FDA and has been authorized for detection and/or diagnosis of SARS-CoV-2 by FDA under an Emergency Use Authorization (EUA). This EUA will remain in effect (meaning this test can be used) for the duration of the COVID-19 declaration under Section 564(b)(1) of the Act, 21 U.S.C. section 360bbb-3(b)(1), unless the authorization is terminated or revoked.  Performed at [facility], [address] [place], [place] [address]   Culture, Urine     Status: None   Collection Time: 07/15/20  9:30 PM   Specimen: Urine, Clean Catch  Result Value Ref Range Status   Specimen Description   Final    URINE, CLEAN CATCH Performed at [facility], [address] [place], [place] [address]    Special Requests   Final    NONE Performed at [facility], [address], [place], [place]  [address]    Culture   Final    NO GROWTH Performed at [facility], [address], [place], [place] [address]    Report Status 07/17/2020 FINAL  Final  MRSA PCR Screening     Status: None   Collection Time: 07/18/20 10:19 AM   Specimen: Nasal Mucosa; Nasopharyngeal  Result Value Ref Range Status   MRSA by PCR NEGATIVE NEGATIVE Final    Comment:        The GeneXpert MRSA Assay (FDA approved for NASAL specimens only), is one component of a comprehensive MRSA colonization surveillance program. It is not intended to diagnose MRSA infection nor to guide or monitor treatment for MRSA infections. Performed at [facility], [address], [place], [place] [address]          Radiology Studies: DG CHEST PORT 1 VIEW  Result Date: 07/17/2020 CLINICAL DATA:  Increasing wheezing for 1 day EXAM: PORTABLE CHEST 1 VIEW COMPARISON:  Film from the previous day. FINDINGS: Cardiac shadow is stable. Lungs are well aerated bilaterally. Some increasing airspace opacity in the right base is noted which may represent some atelectasis or early infiltrate. No bony abnormality is noted. IMPRESSION: Increasing airspace opacity in the right base. No other focal abnormality is noted. Electronically Signed   By: Alcide Clever M.D.   On: 07/17/2020 15:04        Scheduled Meds: . Chlorhexidine Gluconate Cloth  6 each Topical Daily  . feeding supplement  1 Container Oral BID BM  . furosemide  20 mg Intravenous Once  . [START ON 07/20/2020] furosemide  20 mg Oral Daily  . heparin  5,000 Units Subcutaneous Q8H  . indomethacin  100 mg Rectal Once  . ipratropium  0.5 mg Nebulization TID  . levalbuterol  0.63 mg Nebulization TID  . levothyroxine  75 mcg Oral QAC breakfast  . sodium chloride flush  10-40 mL Intracatheter Q12H  . verapamil  240 mg Oral Daily   Continuous Infusions: . ciprofloxacin 400 mg (07/19/20 0851)   And  . metronidazole 500 mg (07/19/20 1147)     LOS: 5  days    Time spent: 35 minutes    Ramiro Harvest, MD [facility]   To contact the attending provider between 7A-7P or  the covering provider during after hours 7P-7A, please log into the web site [email or web address] and access using universal [place] password for that web site. If you do not have the password, please call the hospital operator.  07/19/2020, 1:09 PM

## 2020-07-20 DIAGNOSIS — K819 Cholecystitis, unspecified: Secondary | ICD-10-CM | POA: Diagnosis not present

## 2020-07-20 DIAGNOSIS — N390 Urinary tract infection, site not specified: Secondary | ICD-10-CM | POA: Diagnosis not present

## 2020-07-20 DIAGNOSIS — I5033 Acute on chronic diastolic (congestive) heart failure: Secondary | ICD-10-CM | POA: Diagnosis not present

## 2020-07-20 DIAGNOSIS — K851 Biliary acute pancreatitis without necrosis or infection: Secondary | ICD-10-CM | POA: Diagnosis not present

## 2020-07-20 LAB — CBC WITH DIFFERENTIAL/PLATELET
Abs Immature Granulocytes: 0.1 10*3/uL — ABNORMAL HIGH (ref 0.00–0.07)
Basophils Absolute: 0 10*3/uL (ref 0.0–0.1)
Basophils Relative: 0 %
Eosinophils Absolute: 0 10*3/uL (ref 0.0–0.5)
Eosinophils Relative: 0 %
HCT: 34.2 % — ABNORMAL LOW (ref 36.0–46.0)
Hemoglobin: 11.3 g/dL — ABNORMAL LOW (ref 12.0–15.0)
Immature Granulocytes: 1 %
Lymphocytes Relative: 10 %
Lymphs Abs: 0.8 10*3/uL (ref 0.7–4.0)
MCH: 32.5 pg (ref 26.0–34.0)
MCHC: 33 g/dL (ref 30.0–36.0)
MCV: 98.3 fL (ref 80.0–100.0)
Monocytes Absolute: 0.8 10*3/uL (ref 0.1–1.0)
Monocytes Relative: 10 %
Neutro Abs: 6.1 10*3/uL (ref 1.7–7.7)
Neutrophils Relative %: 79 %
Platelets: 176 10*3/uL (ref 150–400)
RBC: 3.48 MIL/uL — ABNORMAL LOW (ref 3.87–5.11)
RDW: 14 % (ref 11.5–15.5)
WBC: 7.8 10*3/uL (ref 4.0–10.5)
nRBC: 0 % (ref 0.0–0.2)

## 2020-07-20 LAB — COMPREHENSIVE METABOLIC PANEL
ALT: 112 U/L — ABNORMAL HIGH (ref 0–44)
AST: 39 U/L (ref 15–41)
Albumin: 2.6 g/dL — ABNORMAL LOW (ref 3.5–5.0)
Alkaline Phosphatase: 85 U/L (ref 38–126)
Anion gap: 4 — ABNORMAL LOW (ref 5–15)
BUN: 23 mg/dL (ref 8–23)
CO2: 33 mmol/L — ABNORMAL HIGH (ref 22–32)
Calcium: 7.9 mg/dL — ABNORMAL LOW (ref 8.9–10.3)
Chloride: 98 mmol/L (ref 98–111)
Creatinine, Ser: 1.03 mg/dL — ABNORMAL HIGH (ref 0.44–1.00)
GFR, Estimated: 53 mL/min — ABNORMAL LOW (ref >60)
Glucose, Bld: 214 mg/dL — ABNORMAL HIGH (ref 70–99)
Potassium: 3.3 mmol/L — ABNORMAL LOW (ref 3.5–5.1)
Sodium: 135 mmol/L (ref 135–145)
Total Bilirubin: 1.1 mg/dL (ref 0.3–1.2)
Total Protein: 5.4 g/dL — ABNORMAL LOW (ref 6.5–8.1)

## 2020-07-20 LAB — MAGNESIUM: Magnesium: 1.7 mg/dL (ref 1.7–2.4)

## 2020-07-20 MED ORDER — ACETAMINOPHEN 325 MG PO TABS: 650.0000 mg | ORAL_TABLET | Freq: Four times a day (QID) | ORAL | Status: AC | PRN

## 2020-07-20 MED ORDER — MAGNESIUM SULFATE 4 GM/100ML IV SOLN
4.0000 g | Freq: Once | INTRAVENOUS | Status: AC
  Administered 2020-07-20: 4 g via INTRAVENOUS
  Filled 2020-07-20 (×2): qty 100

## 2020-07-20 MED ORDER — POTASSIUM CHLORIDE CRYS ER 20 MEQ PO TBCR
40.0000 meq | EXTENDED_RELEASE_TABLET | Freq: Once | ORAL | Status: AC
  Administered 2020-07-20: 40 meq via ORAL
  Filled 2020-07-20: qty 2

## 2020-07-20 NOTE — Progress Notes (Signed)
2 Days Post-Op   Subjective/Chief Complaint: Patient appears comfortable - not using any pain medication Tolerating diet LFT's WNL   Objective: Vital signs in last 24 hours: Temp:  [97.8 F (36.6 C)-98.5 F (36.9 C)] 98 F (36.7 C) (05/29 0654) Pulse Rate:  [56-63] 56 (05/29 0654) Resp:  [18] 18 (05/29 0654) BP: (125-158)/(58-66) 158/66 (05/29 0654) SpO2:  [90 %-98 %] 98 % (05/29 0734) Weight:  [90.7 kg] 90.7 kg (05/29 0654) Last BM Date: 07/14/20  Intake/Output from previous day: 05/28 0701 - 05/29 0700 In: 940 [P.O.:240; IV Piggyback:700] Out: 2900 [Urine:2900] Intake/Output this shift: No intake/output data recorded.   General appearance: alert, cooperative and no distress GI: soft, minimal tenderness; ecchymosis at all port sites and across lower abdomen  Lab Results:  Recent Labs    07/19/20 0415 07/20/20 0400  WBC 6.6 7.8  HGB 11.6* 11.3*  HCT 34.3* 34.2*  PLT 173 176   BMET Recent Labs    07/19/20 0415 07/20/20 0400  NA 136 135  K 4.0 3.3*  CL 97* 98  CO2 29 33*  GLUCOSE 166* 214*  BUN 23 23  CREATININE 1.14* 1.03*  CALCIUM 8.1* 7.9*   PT/INR No results for input(s): LABPROT, INR in the last 72 hours. ABG No results for input(s): PHART, HCO3 in the last 72 hours.  Invalid input(s): PCO2, PO2  Studies/Results: No results found.  Anti-infectives: Anti-infectives (From admission, onward)   Start     Dose/Rate Route Frequency Ordered Stop   07/17/20 1200  metroNIDAZOLE (FLAGYL) IVPB 500 mg       "And" Linked Group Details   500 mg 100 mL/hr over 60 Minutes Intravenous Every 8 hours 07/17/20 0752 07/19/20 2129   07/17/20 1000  ciprofloxacin (CIPRO) IVPB 400 mg       "And" Linked Group Details   400 mg 200 mL/hr over 60 Minutes Intravenous Every 12 hours 07/17/20 0752 07/19/20 2242   07/16/20 0900  ciprofloxacin (CIPRO) IVPB 400 mg  Status:  Discontinued       "And" Linked Group Details   400 mg 200 mL/hr over 60 Minutes Intravenous  Every 24 hours 07/15/20 1326 07/17/20 0752   07/15/20 1415  metroNIDAZOLE (FLAGYL) IVPB 500 mg  Status:  Discontinued       "And" Linked Group Details   500 mg 100 mL/hr over 60 Minutes Intravenous Every 8 hours 07/15/20 1326 07/17/20 0752   07/14/20 2200  ciprofloxacin (CIPRO) IVPB 400 mg  Status:  Discontinued       "And" Linked Group Details   400 mg 200 mL/hr over 60 Minutes Intravenous Every 12 hours 07/14/20 1547 07/15/20 1326   07/14/20 2200  metroNIDAZOLE (FLAGYL) IVPB 500 mg  Status:  Discontinued       "And" Linked Group Details   500 mg 100 mL/hr over 60 Minutes Intravenous Every 8 hours 07/14/20 1547 07/15/20 1326   07/14/20 1645  ciprofloxacin (CIPRO) IVPB 400 mg  Status:  Discontinued       "And" Linked Group Details   400 mg 200 mL/hr over 60 Minutes Intravenous Every 12 hours 07/14/20 1545 07/14/20 1547   07/14/20 1645  metroNIDAZOLE (FLAGYL) IVPB 500 mg  Status:  Discontinued       "And" Linked Group Details   500 mg 100 mL/hr over 60 Minutes Intravenous Every 8 hours 07/14/20 1545 07/14/20 1547   07/14/20 1130  ciprofloxacin (CIPRO) IVPB 400 mg       "And" Linked Group Details  400 mg 200 mL/hr over 60 Minutes Intravenous  Once 07/14/20 1122 07/14/20 1312   07/14/20 1130  metroNIDAZOLE (FLAGYL) IVPB 500 mg       "And" Linked Group Details   500 mg 100 mL/hr over 60 Minutes Intravenous  Once 07/14/20 1122 07/14/20 2051      Assessment/Plan: PAF on eliquis (last dose5/22 in PM) HTN HLD Diastolic CHF- cardiologist Dr. Jens Som, ECHO 06/26/19 EF 60-65%. Cardiology has seen and reports patient is"intermediate risk and no further cardiac work-up recommended."TRH diuresing Recurrent UTIs Hypothyroidism Anxiety/depression Code status DNR AKI - Cr1.16  Acute pancreatitis, suspect gallstone pancreatitis Possible cholecystitis Elevated LFTs -s/p ERCP5/25:A filling defect consistent with a stone and sludge was seen on the cholangiogram, the examination was  suspicious for choledocholithiasis, abiliary sphincterotomy was performed, the biliary tree was swept and sludge was found   S/p robotic cholecystectomy by Dr. Daphine Deutscher 5/27. Doing well POD #2 May restart Eliquis   ID -cipro/flagyl5/23>> VTE -Resume sq heparin FEN -IVF per TRH,lowfat diet Foley -none Follow up -[facility]  LOS: 6 days    Wynona Luna 07/20/2020

## 2020-07-20 NOTE — Plan of Care (Signed)
  Problem: Activity: Goal: Risk for activity intolerance will decrease Outcome: Progressing   Problem: Nutrition: Goal: Adequate nutrition will be maintained Outcome: Progressing   Problem: Clinical Measurements: Goal: Respiratory complications will improve Outcome: Adequate for Discharge   Problem: Pain Managment: Goal: General experience of comfort will improve Outcome: Adequate for Discharge

## 2020-07-20 NOTE — Discharge Summary (Signed)
Physician Discharge Summary  Alyssa Williamson GDJ:[id number] DOB: [birth year] DOA: 07/14/2020  PCP: [name], MD  Admit date: 07/14/2020 Discharge date: 07/20/2020  Time spent: 55 minutes  Recommendations for Outpatient Follow-up:  1.  Follow-up with [facility] 07/30/2021. 2.  Follow-up with [name], MD in 1 to 2 weeks.  On follow-up patient will need a comprehensive metabolic profile done to follow-up on electrolytes and renal function.  Patient will need a magnesium level checked.  Patient statin was discontinued during the hospitalization and needs to be reassessed pending LFTs asked to when it may be resumed.     Discharge Diagnoses:  Principal Problem:   Cholecystitis Active Problems:   Hypothyroidism   HTN (hypertension)   Hyperlipidemia   Recurrent UTI   Atrial fibrillation (HCC)   Acute on chronic diastolic CHF (congestive heart failure) (HCC)   Acute biliary pancreatitis   AKI (acute kidney injury) (HCC)   Elevated LFTs   Acute lower UTI   Depression   Choledocholithiasis   S/P cholecystectomy-Xi robot   Discharge Condition: Stable and improved.  Diet recommendation: Heart healthy/low-fat diet.  Filed Weights   07/18/20 0944 07/19/20 0633 07/20/20 0654  Weight: 91.3 kg 89.8 kg 90.7 kg    History of present illness:  HPI per Dr. Neysa Bonito This is an 87 year old female with past medical history of atrial fibrillation on Eliquis, hypertension, hyperlipidemia, hypothyroidism, chronic diastolic CHF who presented to [facility] with complaints of nausea, vomiting and right-sided abdominal pain with associated diarrhea for almost 2 weeks.  Associated low-grade fever.  She was treated with Bactrim for a UTI on 4/18.  Saw her PCP on 5/17 when the patient reported her GI symptoms and her PCP initially felt patient had a norovirus infection as her symptoms initially resolved.  Unfortunately her symptoms of right flank pain radiating to her lower abdomen with emesis  and low-grade fevers recurred, prompting her to go to the ED and be on admission.  Currently feeling better overall denied chest pain, shortness of breath or any other significant complaints.  Currently, she is on oxygen and stated that she does not wear O2 at baseline.   ED Course: Afebrile, hemodynamically stable, on room air. Notable Labs: Sodium 136, K3.7, BUN 17, creatinine 0.89, alk phos 119, lipase 543, AST 636, ALT 383, T bili 1.9, WBC 12.9, Hb 13.6, platelets 201, UA positive for infection, COVID-19 and flu negative. Notable Imaging: RUQ [place]- minimal pericholecystic fluid, gallbladder sludge, sonographic Murphy sign positive suspicious for low-level cholecystitis or chronic cholecystitis. Patient received fentanyl, Zofran, Cipro, Flagyl, 500 cc NS bolus.  ED provider discussed with Dr. Cristina Gong, GI, who requested MRI/MRCP and Dr. Hassell Done from general surgery was consulted.  Patient was transferred to [facility] for further work-up  Hospital Course:  1 gallstone pancreatitis/choledocholithiasis/acute cholecystitis -Patient presented with right upper quadrant abdominal pain, nausea vomiting noted to have a transaminitis with elevated bilirubin, lipase elevated (1086) transaminitis with AST and ALT which subsequently trended down during the hospitalization with clinical improvement and treatment.. -Lipase trended down to 39 from 83 from 147 from 639 from 1086 -Abdominal ultrasound done concerning for acute cholecystitis. -MRCP with intra and extrahepatic biliary duct dilatation concerning for calculus at the ampulla, tiny gallstones and distended gallbladder, no imaging evidence of acute pancreatitis or complication, no gallbladder wall thickening or pericholecystic fluid. -Patient was placed on ciprofloxacin/Flagyl during the hospitalization. -GI was consulted and patient underwent ERCP with filling defect consistent with a stone and sludge seen on  cholangiogram, examination suspicious for  choledocholithiasis, biliary sphincterotomy was performed, biliary tree swept and sludge found. -Leukocytosis trended down. -Status post robotic cholecystectomy 07/18/2020.   -Postcholecystectomy patient started on clear liquid diet which he tolerated and diet advanced to a soft diet which he tolerated.   -Patient completed 5-day course of IV antibiotics during the hospitalization and did not require any further antibiotics on discharge.   -GI followed the patient during the hospitalization.   -General surgery following the patient and patient will follow up in the outpatient setting with general surgery 1 to 2 weeks post discharge.   -Patient improved clinically, patient will be discharged home in stable and improved condition.   2.  Acute on chronic diastolic CHF -Patient with volume overload on examination on 07/17/2020 with audible wheezing, shortness of breath on exertion, lower extremity edema.   -BNP noted to be elevated at 673.7 and trended down to 384.6.  -2D echo done with EF 55 to 70%, grade 2 diastolic dysfunction -IV fluids have been saline locked. -Patient placed on IV Lasix with good diuresis and clinical improvement.   -Patient's oxygenation improved and patient was satting 98% on room air by day of discharge.   -Patient was transitioned from IV Lasix back to home dose oral Lasix.   -Outpatient follow-up with PCP and cardiology.   3.  Acute kidney injury -Creatinine fluctuated during the hospitalization.  -Renal ultrasound negative for hydronephrosis  -IV fluids which initially were started due to problem #1 and subsequently discontinued.  -Placed on IV Lasix due to concerns for volume. -Renal function improved with diuresis and creatinine was down to 1.03 by day of discharge.   -Outpatient follow-up with PCP.  4.  Chronic atrial fibrillation -Patient maintained on verapamil for rate control.   -Eliquis held in anticipation of cholecystectomy which was done 07/18/2020  . -Eliquis was resumed the evening of 07/19/2020 without any complications.   -Outpatient follow-up with PCP and cardiology.    5.  Hypertension -Patient maintained on verapamil during the hospitalization.  Patient received doses of IV Lasix due to concerns for volume overload and subsequently transition back to home regimen oral Lasix.   -Outpatient follow-up with PCP.    6.  Hyperlipidemia -Statin was held during the hospitalization secondary to transaminitis.   -Statin discontinued on discharge.   -We will need outpatient follow-up with PCP for repeat LFTs and defer resumption of statin to PCP in the outpatient setting.   7.  Hypothyroidism -Patient maintained on home regimen of Synthroid.  8.  Asymptomatic UTI -Urine cultures with no growth to date.   -Patient received IV ciprofloxacin secondary to problem #1.    9.  Depression/anxiety -Xanax as needed.   -Celexa held during the hospitalization will be resumed on discharge.     Procedures:  Chest x-ray 07/15/2020, 07/16/2020, 07/17/2020  MRCP 07/15/2020  Renal ultrasound 07/15/2020  Abdominal ultrasound 07/14/2020  ERCP per Dr. Therisa Doyne 07/16/2020  Robotic cholecystectomy per Dr. Hassell Done 07/18/2020   Consultations:  Cardiology: Dr. Gardiner Rhyme 07/15/2020  Gastroenterology: Dr. Cristina Gong 07/14/2020  General surgery: Dr. Hassell Done 07/15/2020    Discharge Exam: Vitals:   07/20/20 0654 07/20/20 0734  BP: (!) 158/66   Pulse: (!) 56   Resp: 18   Temp: 98 F (36.7 C)   SpO2:  98%    General: NAD Cardiovascular: RRR Respiratory: CTAB  Discharge Instructions   Discharge Instructions    Diet - low sodium heart healthy   Complete by: As directed  LOW FAT DIET   Increase activity slowly   Complete by: As directed      Allergies as of 07/20/2020      Reactions   Cefdinir Dermatitis   Codeine Nausea And Vomiting      Medication List    STOP taking these medications   atorvastatin 40 MG tablet Commonly known  as: LIPITOR   levocetirizine 5 MG tablet Commonly known as: XYZAL     TAKE these medications   acetaminophen 325 MG tablet Commonly known as: TYLENOL Take 2 tablets (650 mg total) by mouth every 6 (six) hours as needed for mild pain (or Fever >/= 101).   ALPRAZolam 0.25 MG tablet Commonly known as: XANAX Take 1 tablet (0.25 mg total) by mouth daily as needed for anxiety.   budesonide-formoterol 160-4.5 MCG/ACT inhaler Commonly known as: SYMBICORT Inhale 2 puffs into the lungs 2 (two) times daily. What changed:   when to take this  reasons to take this   CENTRUM SILVER PO Take 1 tablet by mouth daily.   citalopram 20 MG tablet Commonly known as: CELEXA Take 1 tablet (20 mg total) by mouth daily.   Cranberry 500 MG Caps Take 500 mg by mouth daily.   Eliquis 5 MG Tabs tablet Generic drug: apixaban Take 1 tablet by mouth twice daily What changed: how much to take   Euthyrox 75 MCG tablet Generic drug: levothyroxine TAKE 1 TABLET BY MOUTH ONCE DAILY BEFORE BREAKFAST What changed: how much to take   furosemide 20 MG tablet Commonly known as: LASIX Take 1 tablet (20 mg total) by mouth daily.   loratadine 10 MG tablet Commonly known as: CLARITIN Take 10 mg by mouth daily.   Pfizer-BioNT COVID-19 Vac-TriS Susp injection Generic drug: COVID-19 mRNA Vac-TriS (Pfizer) Inject into the muscle.   verapamil 240 MG CR tablet Commonly known as: CALAN-SR Take 1 tablet (240 mg total) by mouth daily.      Allergies  Allergen Reactions  . Cefdinir Dermatitis  . Codeine Nausea And Vomiting    Follow-up Information    [facility] Follow up.   Specialty: General Surgery Why: please follow up in clinic on 07/30/20 at 1:45 pm please arrive 30 minutes prior to your appointment and bring photo ID and insurance card Contact information: [address] [place] [phone]       [name], MD. Schedule  an appointment as soon as possible for a visit in 2 week(s).   Specialty: Family Medicine Why: F/U IN 1-2 WEEKS Contact information: [place] [id number] [phone]        Lelon Perla, MD .   Specialty: Cardiology Contact information: [address] [place] [address] [phone]                The results of significant diagnostics from this hospitalization (including imaging, microbiology, ancillary and laboratory) are listed below for reference.    Significant Diagnostic Studies: US RENAL  Result Date: 07/15/2020 CLINICAL DATA:  87 year old female with acute renal insufficiency. EXAM: RENAL / URINARY TRACT ULTRASOUND COMPLETE COMPARISON:  Abdominal MRI dated 07/15/2020 FINDINGS: Right Kidney: Renal measurements: 6.7 x 3.7 x 3.9 cm = volume: 51 mL. There is moderate parenchyma atrophy and cortical thinning. Normal echogenicity. No hydronephrosis or shadowing stone. Left Kidney: Renal measurements: 9.7 x 4.5 x 5.0 cm = volume: 150 mL. Mild parenchyma atrophy. Normal echogenicity. No hydronephrosis or  shadowing stone. Bladder: Appears normal for degree of bladder distention. Other: None. IMPRESSION: Moderate right and mild left renal parenchyma atrophy. No hydronephrosis or shadowing stone. Electronically Signed   By: Anner Crete M.D.   On: 07/15/2020 20:35   MR 3D Recon At Scanner  Result Date: 07/15/2020 CLINICAL DATA:  Jaundice, acute pancreatitis, concern for gallstone pancreatitis, abnormal LFTs, suspected choledocholithiasis EXAM: MRI ABDOMEN WITHOUT AND WITH CONTRAST (INCLUDING MRCP) TECHNIQUE: Multiplanar multisequence MR imaging of the abdomen was performed both before and after the administration of intravenous contrast. Heavily T2-weighted images of the biliary and pancreatic ducts were obtained, and three-dimensional MRCP images were rendered by post processing. CONTRAST:  89m GADAVIST GADOBUTROL 1 MMOL/ML  IV SOLN COMPARISON:  Right upper quadrant ultrasound, 07/14/2020 FINDINGS: Examination is generally somewhat limited by body habitus and breath motion artifact throughout. Lower chest: No acute findings. Hepatobiliary: No mass or other parenchymal abnormality identified. Tiny gallstones in the distended gallbladder (series 11, image 19). Intra and extrahepatic biliary ductal dilatation, common bile duct measuring up to 1.0 cm. Although not definitive, appearance of the distal common bile duct is suspicious for the presence of a tiny calculus at the ampulla (series 10, image 11). Pancreas: No mass, inflammatory changes, or other parenchymal abnormality identified. Spleen:  Within normal limits in size and appearance. Adrenals/Urinary Tract: No masses identified. No evidence of hydronephrosis. Stomach/Bowel: Visualized portions within the abdomen are unremarkable. Vascular/Lymphatic: No pathologically enlarged lymph nodes identified. No abdominal aortic aneurysm demonstrated. Other:  None. Musculoskeletal: No suspicious bone lesions identified. IMPRESSION: 1. Intra and extrahepatic biliary ductal dilatation, common bile duct measuring up to 1.0 cm. Although not definitive, appearance of the distal common bile duct is suspicious for the presence of a tiny calculus at the ampulla. 2. Tiny gallstones in the distended gallbladder. No gallbladder wall thickening or pericholecystic fluid. 3. No imaging evidence of acute pancreatitis or complication such as acute pancreatic fluid collection. Electronically Signed   By: AEddie CandleM.D.   On: 07/15/2020 12:35   DG CHEST PORT 1 VIEW  Result Date: 07/17/2020 CLINICAL DATA:  Increasing wheezing for 1 day EXAM: PORTABLE CHEST 1 VIEW COMPARISON:  Film from the previous day. FINDINGS: Cardiac shadow is stable. Lungs are well aerated bilaterally. Some increasing airspace opacity in the right base is noted which may represent some atelectasis or early infiltrate. No bony  abnormality is noted. IMPRESSION: Increasing airspace opacity in the right base. No other focal abnormality is noted. Electronically Signed   By: MInez CatalinaM.D.   On: 07/17/2020 15:04   DG CHEST PORT 1 VIEW  Result Date: 07/16/2020 CLINICAL DATA:  Shortness of breath. EXAM: PORTABLE CHEST 1 VIEW COMPARISON:  Chest x-ray 07/15/20 FINDINGS: The heart size and mediastinal contours are unchanged. Aortic calcifications. No focal consolidation. No pulmonary edema. No pleural effusion. No pneumothorax. No acute osseous abnormality. IMPRESSION: No active disease. Electronically Signed   By: MIven FinnM.D.   On: 07/16/2020 05:27   DG CHEST PORT 1 VIEW  Result Date: 07/15/2020 CLINICAL DATA:  Abdominal pain for 1 day, RIGHT upper quadrant pain with associated with nausea and vomiting, shaking chills, question choledocholithiasis EXAM: PORTABLE CHEST 1 VIEW COMPARISON:  Portable exam 0810 hours compared to 04/25/2020 FINDINGS: Minimal enlargement of cardiac silhouette. Mediastinal contours and pulmonary vascularity normal. Atherosclerotic calcification of descending thoracic aorta. Lungs clear. No pulmonary infiltrate, pleural effusion, or pneumothorax. Bones demineralized. IMPRESSION: No acute abnormalities. Aortic Atherosclerosis (ICD10-I70.0). Electronically Signed   By: MLavonia Dana  M.D.   On: 07/15/2020 08:33   DG ERCP BILIARY & PANCREATIC DUCTS  Result Date: 07/16/2020 CLINICAL DATA:  Probable choledocholithiasis EXAM: ERCP TECHNIQUE: Multiple spot images obtained with the fluoroscopic device and submitted for interpretation post-procedure. FLUOROSCOPY TIME:  Fluoroscopy Time:  2 minutes 49 seconds Radiation Exposure Index (if provided by the fluoroscopic device): 49.8 Number of Acquired Spot Images: 0 COMPARISON:  MRCP 07/15/2020 FINDINGS: Nine intraoperative saved images demonstrate a flexible endoscope in the descending duodenum followed by wire cannulation of the intrahepatic ducts and  cholangiogram. Mild biliary ductal dilatation. Subsequent images demonstrate sphincterotomy and balloon sweeping of the common duct. IMPRESSION: ERCP with sphincterotomy and balloon sweeping of the common duct. These images were submitted for radiologic interpretation only. Please see the procedural report for the amount of contrast and the fluoroscopy time utilized. Electronically Signed   By: Jacqulynn Cadet M.D.   On: 07/16/2020 11:49   MR ABDOMEN MRCP W WO CONTAST  Result Date: 07/15/2020 CLINICAL DATA:  Jaundice, acute pancreatitis, concern for gallstone pancreatitis, abnormal LFTs, suspected choledocholithiasis EXAM: MRI ABDOMEN WITHOUT AND WITH CONTRAST (INCLUDING MRCP) TECHNIQUE: Multiplanar multisequence MR imaging of the abdomen was performed both before and after the administration of intravenous contrast. Heavily T2-weighted images of the biliary and pancreatic ducts were obtained, and three-dimensional MRCP images were rendered by post processing. CONTRAST:  49m GADAVIST GADOBUTROL 1 MMOL/ML IV SOLN COMPARISON:  Right upper quadrant ultrasound, 07/14/2020 FINDINGS: Examination is generally somewhat limited by body habitus and breath motion artifact throughout. Lower chest: No acute findings. Hepatobiliary: No mass or other parenchymal abnormality identified. Tiny gallstones in the distended gallbladder (series 11, image 19). Intra and extrahepatic biliary ductal dilatation, common bile duct measuring up to 1.0 cm. Although not definitive, appearance of the distal common bile duct is suspicious for the presence of a tiny calculus at the ampulla (series 10, image 11). Pancreas: No mass, inflammatory changes, or other parenchymal abnormality identified. Spleen:  Within normal limits in size and appearance. Adrenals/Urinary Tract: No masses identified. No evidence of hydronephrosis. Stomach/Bowel: Visualized portions within the abdomen are unremarkable. Vascular/Lymphatic: No pathologically enlarged  lymph nodes identified. No abdominal aortic aneurysm demonstrated. Other:  None. Musculoskeletal: No suspicious bone lesions identified. IMPRESSION: 1. Intra and extrahepatic biliary ductal dilatation, common bile duct measuring up to 1.0 cm. Although not definitive, appearance of the distal common bile duct is suspicious for the presence of a tiny calculus at the ampulla. 2. Tiny gallstones in the distended gallbladder. No gallbladder wall thickening or pericholecystic fluid. 3. No imaging evidence of acute pancreatitis or complication such as acute pancreatic fluid collection. Electronically Signed   By: AEddie CandleM.D.   On: 07/15/2020 12:35   ECHOCARDIOGRAM COMPLETE  Result Date: 07/15/2020    ECHOCARDIOGRAM REPORT   Patient Name:   Alyssa MORDENDate of Exam: 07/15/2020 Medical Rec #:  [id number]    Height:       62.0 in Accession #:    [id number]   Weight:       193.6 lb Date of Birth:  [birth year]    BSA:          1.885 m Patient Age:    89years      BP:           128/66 mmHg Patient Gender: F             HR:           69 bpm. Exam Location:  Inpatient Procedure: 2D Echo, Cardiac Doppler and Color Doppler Indications:    CHF  History:        Patient has prior history of Echocardiogram examinations, most                 recent 06/26/2019. Arrythmias:Atrial Fibrillation,                 Signs/Symptoms:Murmur; Risk Factors:Hypertension and                 Dyslipidemia.  Sonographer:    Luisa Hart RDCS Referring Phys: [id number] Harold Hedge  Sonographer Comments: Patient is morbidly obese. Image acquisition challenging due to patient body habitus. IMPRESSIONS  1. Left ventricular ejection fraction, by estimation, is 55 to 60%. The left ventricle has normal function. The left ventricle has no regional wall motion abnormalities. Left ventricular diastolic parameters are consistent with Grade II diastolic dysfunction (pseudonormalization).  2. Right ventricular systolic function is normal. The right  ventricular size is mildly enlarged. There is mildly elevated pulmonary artery systolic pressure. The estimated right ventricular systolic pressure is 37.8 mmHg.  3. Right atrial size was mildly dilated.  4. The mitral valve is normal in structure. Mild mitral valve regurgitation. No evidence of mitral stenosis.  5. The aortic valve is normal in structure. There is mild calcification of the aortic valve. There is mild thickening of the aortic valve. Aortic valve regurgitation is not visualized. Mild aortic valve sclerosis is present, with no evidence of aortic valve stenosis.  6. The inferior vena cava is normal in size with greater than 50% respiratory variability, suggesting right atrial pressure of 3 mmHg. FINDINGS  Left Ventricle: Left ventricular ejection fraction, by estimation, is 55 to 60%. The left ventricle has normal function. The left ventricle has no regional wall motion abnormalities. The left ventricular internal cavity size was normal in size. There is  no left ventricular hypertrophy. Left ventricular diastolic parameters are consistent with Grade II diastolic dysfunction (pseudonormalization). Right Ventricle: The right ventricular size is mildly enlarged. No increase in right ventricular wall thickness. Right ventricular systolic function is normal. There is mildly elevated pulmonary artery systolic pressure. The tricuspid regurgitant velocity is 2.92 m/s, and with an assumed right atrial pressure of 3 mmHg, the estimated right ventricular systolic pressure is 58.8 mmHg. Left Atrium: Left atrial size was normal in size. Right Atrium: Right atrial size was mildly dilated. Pericardium: There is no evidence of pericardial effusion. Mitral Valve: The mitral valve is normal in structure. Mild mitral annular calcification. Mild mitral valve regurgitation. No evidence of mitral valve stenosis. MV peak gradient, 8.0 mmHg. The mean mitral valve gradient is 3.0 mmHg. Tricuspid Valve: The tricuspid valve is  normal in structure. Tricuspid valve regurgitation is trivial. No evidence of tricuspid stenosis. Aortic Valve: The aortic valve is normal in structure. There is mild calcification of the aortic valve. There is mild thickening of the aortic valve. Aortic valve regurgitation is not visualized. Mild aortic valve sclerosis is present, with no evidence of aortic valve stenosis. Aortic valve mean gradient measures 6.0 mmHg. Aortic valve peak gradient measures 10.2 mmHg. Aortic valve area, by VTI measures 1.93 cm. Pulmonic Valve: The pulmonic valve was normal in structure. Pulmonic valve regurgitation is not visualized. No evidence of pulmonic stenosis. Aorta: The aortic root is normal in size and structure. Venous: The inferior vena cava is normal in size with greater than 50% respiratory variability, suggesting right atrial pressure of 3 mmHg. IAS/Shunts: No atrial level  shunt detected by color flow Doppler.  LEFT VENTRICLE PLAX 2D LVIDd:         5.20 cm     Diastology LVIDs:         2.60 cm     LV e' medial:    5.18 cm/s LV PW:         1.00 cm     LV E/e' medial:  22.4 LV IVS:        1.00 cm     LV e' lateral:   5.70 cm/s LVOT diam:     1.80 cm     LV E/e' lateral: 20.4 LV SV:         67 LV SV Index:   36 LVOT Area:     2.54 cm  LV Volumes (MOD) LV vol d, MOD A2C: 51.1 ml LV vol d, MOD A4C: 41.6 ml LV vol s, MOD A2C: 23.2 ml LV vol s, MOD A4C: 24.3 ml LV SV MOD A2C:     27.9 ml LV SV MOD A4C:     41.6 ml LV SV MOD BP:      21.0 ml RIGHT VENTRICLE RV Basal diam:  4.20 cm RV Mid diam:    2.50 cm RV S prime:     13.10 cm/s TAPSE (M-mode): 2.6 cm LEFT ATRIUM             Index       RIGHT ATRIUM           Index LA diam:        4.10 cm 2.17 cm/m  RA Area:     15.10 cm LA Vol (A2C):   54.3 ml 28.80 ml/m RA Volume:   37.40 ml  19.84 ml/m LA Vol (A4C):   61.4 ml 32.57 ml/m LA Biplane Vol: 61.9 ml 32.83 ml/m  AORTIC VALVE                    PULMONIC VALVE AV Area (Vmax):    1.65 cm     PV Vmax:       0.81 m/s AV Area  (Vmean):   1.80 cm     PV Vmean:      65.200 cm/s AV Area (VTI):     1.93 cm     PV VTI:        0.234 m AV Vmax:           160.00 cm/s  PV Peak grad:  2.6 mmHg AV Vmean:          109.000 cm/s PV Mean grad:  2.0 mmHg AV VTI:            0.350 m AV Peak Grad:      10.2 mmHg AV Mean Grad:      6.0 mmHg LVOT Vmax:         104.00 cm/s LVOT Vmean:        77.100 cm/s LVOT VTI:          0.265 m LVOT/AV VTI ratio: 0.76  AORTA Ao Root diam: 2.70 cm Ao Asc diam:  2.60 cm MITRAL VALVE                TRICUSPID VALVE MV Area (PHT): 5.62 cm     TR Peak grad:   34.1 mmHg MV Area VTI:   1.80 cm     TR Vmax:        292.00 cm/s MV Peak grad:  8.0 mmHg MV Mean  grad:  3.0 mmHg     SHUNTS MV Vmax:       1.41 m/s     Systemic VTI:  0.26 m MV Vmean:      70.1 cm/s    Systemic Diam: 1.80 cm MV Decel Time: 135 msec MR Peak grad: 72.9 mmHg MR Mean grad: 53.0 mmHg MR Vmax:      427.00 cm/s MR Vmean:     352.0 cm/s MV E velocity: 116.00 cm/s MV A velocity: 45.90 cm/s MV E/A ratio:  2.53 Candee Furbish MD Electronically signed by Candee Furbish MD Signature Date/Time: 07/15/2020/2:21:55 PM    Final    US Abdomen Limited RUQ (LIVER/GB)  Result Date: 07/14/2020 CLINICAL DATA:  Right upper quadrant pain, nausea, and vomiting. EXAM: ULTRASOUND ABDOMEN LIMITED RIGHT UPPER QUADRANT COMPARISON:  Ultrasound abdomen 10/22/2011 FINDINGS: Gallbladder: Sludge noted within the gallbladder lumen. Mild pericholecystic fluid. No gallbladder wall thickening sonographic Percell Miller sign is positive per technologist. Common bile duct: Diameter: 7 mm Liver: No focal lesion identified. Within normal limits in parenchymal echogenicity. Portal vein is patent on color Doppler imaging with normal direction of blood flow towards the liver. Other: None. IMPRESSION: Minimal pericholecystic fluid. Gallbladder sludge. Sonographic Percell Miller sign is positive per technologist. Findings are suspicious for low-level cholecystitis or chronic cholecystitis. Further evaluation with HIDA  scan would be beneficial. Electronically Signed   By: Miachel Roux M.D.   On: 07/14/2020 10:45    Microbiology: Recent Results (from the past 240 hour(s))  Resp Panel by RT-PCR (Flu A&B, Covid) Nasopharyngeal Swab     Status: None   Collection Time: 07/14/20 10:39 AM   Specimen: Nasopharyngeal Swab; Nasopharyngeal(NP) swabs in vial transport medium  Result Value Ref Range Status   SARS Coronavirus 2 by RT PCR NEGATIVE NEGATIVE Final    Comment: (NOTE) SARS-CoV-2 target nucleic acids are NOT DETECTED.  The SARS-CoV-2 RNA is generally detectable in upper respiratory specimens during the acute phase of infection. The lowest concentration of SARS-CoV-2 viral copies this assay can detect is 138 copies/mL. A negative result does not preclude SARS-Cov-2 infection and should not be used as the sole basis for treatment or other patient management decisions. A negative result may occur with  improper specimen collection/handling, submission of specimen other than nasopharyngeal swab, presence of viral mutation(s) within the areas targeted by this assay, and inadequate number of viral copies(<138 copies/mL). A negative result must be combined with clinical observations, patient history, and epidemiological information. The expected result is Negative.  Fact Sheet for Patients:  EntrepreneurPulse.com.au  Fact Sheet for Healthcare Providers:  IncredibleEmployment.be  This test is no t yet approved or cleared by the [place] FDA and  has been authorized for detection and/or diagnosis of SARS-CoV-2 by FDA under an Emergency Use Authorization (EUA). This EUA will remain  in effect (meaning this test can be used) for the duration of the COVID-19 declaration under Section 564(b)(1) of the Act, 21 U.S.C.section 360bbb-3(b)(1), unless the authorization is terminated  or revoked sooner.       Influenza A by PCR NEGATIVE NEGATIVE Final   Influenza B by PCR  NEGATIVE NEGATIVE Final    Comment: (NOTE) The Xpert Xpress SARS-CoV-2/FLU/RSV plus assay is intended as an aid in the diagnosis of influenza from Nasopharyngeal swab specimens and should not be used as a sole basis for treatment. Nasal washings and aspirates are unacceptable for Xpert Xpress SARS-CoV-2/FLU/RSV testing.  Fact Sheet for Patients: EntrepreneurPulse.com.au  Fact Sheet for Healthcare Providers: IncredibleEmployment.be  This  test is not yet approved or cleared by the [place] and has been authorized for detection and/or diagnosis of SARS-CoV-2 by FDA under an Emergency Use Authorization (EUA). This EUA will remain in effect (meaning this test can be used) for the duration of the COVID-19 declaration under Section 564(b)(1) of the Act, 21 U.S.C. section 360bbb-3(b)(1), unless the authorization is terminated or revoked.  Performed at [facility], [address] [place], [place] [address]   Culture, Urine     Status: None   Collection Time: 07/15/20  9:30 PM   Specimen: Urine, Clean Catch  Result Value Ref Range Status   Specimen Description   Final    URINE, CLEAN CATCH Performed at [facility], [place] [address] [place], [place] [address]    Special Requests   Final    NONE Performed at [facility], [place] [address] [place], [place] [address]    Culture   Final    NO GROWTH Performed at [facility], [place] [address] [place], [place] [address]    Report Status 07/17/2020 FINAL  Final  MRSA PCR Screening     Status: None   Collection Time: 07/18/20 10:19 AM   Specimen: Nasal Mucosa; Nasopharyngeal  Result Value Ref Range Status   MRSA by PCR NEGATIVE NEGATIVE Final    Comment:        The GeneXpert MRSA Assay (FDA approved for NASAL specimens only), is one component of a comprehensive MRSA colonization surveillance program. It is not intended to diagnose  MRSA infection nor to guide or monitor treatment for MRSA infections. Performed at [facility], [place] [address] [place],  [address]      Labs: Basic Metabolic Panel: Recent Labs  Lab 07/16/20 0412 07/17/20 0404 07/18/20 0326 07/19/20 0415 07/20/20 0400  NA 134* 133* 136 136 135  K 4.6 4.5 4.1 4.0 3.3*  CL 99 100 99 97* 98  CO2 28 28 27 29  33*  GLUCOSE 82 141* 128* 166* 214*  BUN 26* 24* 27* 23 23  CREATININE 1.64* 1.08* 1.16* 1.14* 1.03*  CALCIUM 8.3* 8.4* 8.5* 8.1* 7.9*  MG 2.0  --  2.1 1.9 1.7  PHOS 3.9  --   --   --   --    Liver Function Tests: Recent Labs  Lab 07/16/20 0412 07/17/20 0404 07/18/20 0326 07/19/20 0415 07/20/20 0400  AST 301* 149* 89* 62* 39  ALT 394* 268* 212* 154* 112*  ALKPHOS 141* 128* 117 99 85  BILITOT 3.2* 1.6* 1.2 1.3* 1.1  PROT 6.0* 5.9* 5.9* 6.1* 5.4*  ALBUMIN 3.1* 2.8* 3.0* 2.8* 2.6*   Recent Labs  Lab 07/15/20 0407 07/16/20 0412 07/17/20 0404 07/18/20 0326 07/19/20 0415  LIPASE 1,086* 639* 147* 83* 39   No results for input(s): AMMONIA in the last 168 hours. CBC: Recent Labs  Lab 07/16/20 0412 07/17/20 0404 07/18/20 0326 07/19/20 0415 07/20/20 0400  WBC 14.8* 7.0 9.7 6.6 7.8  NEUTROABS 12.4*  --   --   --  6.1  HGB 12.1 11.1* 11.5* 11.6* 11.3*  HCT 37.0 32.9* 34.4* 34.3* 34.2*  MCV 101.4* 97.6 99.1 98.3 98.3  PLT 226 162 192 173 176   Cardiac Enzymes: No results for input(s): CKTOTAL, CKMB, CKMBINDEX, TROPONINI in the last 168 hours. BNP: BNP (last 3 results) Recent Labs    07/14/20 1732 07/17/20 0404 07/18/20 0326  BNP 759.2* 673.7* 384.6*    ProBNP (last  3 results) Recent Labs    08/07/19 1016 02/06/20 0958 04/25/20 1129  PROBNP 280.0* 822* 265.0*    CBG: No results for input(s): GLUCAP in the last 168 hours.     Signed:  Irine Seal MD.  [facility] 07/20/2020, 1:40 PM

## 2020-07-20 NOTE — Progress Notes (Signed)
AVS given to patient and explained at the bedside. Medications and follow up appointments have been explained with pt verbalizing understanding.  

## 2020-07-22 ENCOUNTER — Telehealth: Payer: Self-pay

## 2020-07-22 LAB — SURGICAL PATHOLOGY

## 2020-07-22 NOTE — Telephone Encounter (Signed)
Transition Care Management Unsuccessful Follow-up Telephone Call  Date of discharge and from where:  07/20/20-[place]  Attempts:  1st Attempt  Reason for unsuccessful TCM follow-up call:  No answer/busy

## 2020-07-23 NOTE — Anesthesia Postprocedure Evaluation (Signed)
Anesthesia Post Note  Patient: AUDEN WETTSTEIN  Procedure(s) Performed: XI ROBOT ASSISTED CHOLECYSTECTOMY (N/A )     Patient location during evaluation: PACU Anesthesia Type: General Level of consciousness: awake and alert Pain management: pain level controlled Vital Signs Assessment: post-procedure vital signs reviewed and stable Respiratory status: spontaneous breathing, nonlabored ventilation, respiratory function stable and patient connected to nasal cannula oxygen Cardiovascular status: blood pressure returned to baseline and stable Postop Assessment: no apparent nausea or vomiting Anesthetic complications: no   No complications documented.  Last Vitals:  Vitals:   07/20/20 0734 07/20/20 1338  BP:  (!) 145/58  Pulse:  (!) 57  Resp:  18  Temp:  36.5 C  SpO2: 98% 98%    Last Pain:  Vitals:   07/20/20 1338  TempSrc: Oral  PainSc:                  Kennieth Rad

## 2020-07-23 NOTE — Telephone Encounter (Signed)
Transition Care Management Unsuccessful Follow-up Telephone Call  Date of discharge and from where:  07/20/20-[facility]  Attempts:  3rd Attempt  Reason for unsuccessful TCM follow-up call:  No answer/busy

## 2020-07-24 NOTE — Progress Notes (Signed)
HPI: FU atrial fibrillation. Nuclear study May 2014 showed breast attenuation but no ischemia. Ejection fraction 79%. Noted to be in new onset atrial 9/18. Seen in atrial fibrillation clinic; treated with rate control and anticoagulation. TSH normal. Echocardiogram May 2022 showed ejection fraction 55 to 60%, grade 2 diastolic dysfunction, mild right atrial enlargement, mild right ventricular enlargement, mild mitral regurgitation.  Recently admitted with gallstone pancreatitis/choledocholithiasis/acute cholecystitis.  She had sphincterotomy with biliary tree sweep.  She ultimately underwent cholecystectomy.  She was diuresed for diastolic congestive heart failure as well.  Since last seen, she denies dyspnea, chest pain, palpitations or syncope.  Minimal pedal edema.  Current Outpatient Medications  Medication Sig Dispense Refill   acetaminophen (TYLENOL) 325 MG tablet Take 2 tablets (650 mg total) by mouth every 6 (six) hours as needed for mild pain (or Fever >/= 101).     albuterol (VENTOLIN HFA) 108 (90 Base) MCG/ACT inhaler Inhale 1 puff into the lungs every 6 (six) hours as needed for wheezing or shortness of breath.     ALPRAZolam (XANAX) 0.25 MG tablet Take 1 tablet (0.25 mg total) by mouth daily as needed for anxiety. 30 tablet 1   budesonide-formoterol (SYMBICORT) 160-4.5 MCG/ACT inhaler Inhale 2 puffs into the lungs 2 (two) times daily. (Patient taking differently: Inhale 2 puffs into the lungs 2 (two) times daily as needed (shortness of breath or wheezing).) 1 each 3   citalopram (CELEXA) 10 MG tablet Take 10 mg by mouth daily.     citalopram (CELEXA) 20 MG tablet Take 1 tablet (20 mg total) by mouth daily. 90 tablet 1   COVID-19 mRNA Vac-TriS, Pfizer, (PFIZER-BIONT COVID-19 VAC-TRIS) SUSP injection Inject into the muscle. 0.3 mL 0   Cranberry 500 MG CAPS Take 500 mg by mouth daily.     ELIQUIS 5 MG TABS tablet Take 1 tablet by mouth twice daily (Patient taking differently: Take 5  mg by mouth 2 (two) times daily.) 180 tablet 1   EUTHYROX 75 MCG tablet TAKE 1 TABLET BY MOUTH ONCE DAILY BEFORE BREAKFAST 90 tablet 0   furosemide (LASIX) 20 MG tablet Take 1 tablet by mouth once daily 90 tablet 1   levothyroxine (SYNTHROID) 50 MCG tablet Take 50 mcg by mouth daily before breakfast.     loratadine (CLARITIN) 10 MG tablet Take 10 mg by mouth daily.     Multiple Vitamins-Minerals (CENTRUM SILVER PO) Take 1 tablet by mouth daily.     verapamil (CALAN-SR) 240 MG CR tablet Take 1 tablet (240 mg total) by mouth daily. 90 tablet 1   No current facility-administered medications for this visit.     Past Medical History:  Diagnosis Date   Allergic rhinitis    Anemia    Anxiety    Arthritis    bilateral knee   Atrial fibrillation (HCC) 12/21/2016   Cervical cancer screening 10/30/2013   Menarche at 15 Regular and moderate flow No history of abnormal pap in past G4P3, s/p 3 svd and 1 MC No history of abnormal MGM Noconcerns today No gyn surgeries   Constipation 11/18/2015   Depression with anxiety    Diverticulitis    Heart murmur    Hyperlipidemia    Hypertension    Hypothyroid    Left knee DJD    Medicare annual wellness visit, subsequent 10/30/2013   PONV (postoperative nausea and vomiting)    Preventative health care 05/20/2016    Past Surgical History:  Procedure Laterality Date   ABDOMINAL  HYSTERECTOMY     bladder tack  04/2010   Dr Marciano Sequin   ERCP N/A 07/16/2020   Procedure: ENDOSCOPIC RETROGRADE CHOLANGIOPANCREATOGRAPHY (ERCP);  Surgeon: Kerin Salen, MD;  Location: [facility];  Service: Gastroenterology;  Laterality: N/A;   GANGLION CYST EXCISION     JOINT REPLACEMENT Right    REMOVAL OF STONES  07/16/2020   Procedure: REMOVAL OF STONES;  Surgeon: Kerin Salen, MD;  Location: [facility];  Service: Gastroenterology;;   REPLACEMENT TOTAL KNEE  10/2010   right knee-[facility]    SPHINCTEROTOMY  07/16/2020   Procedure: SPHINCTEROTOMY;  Surgeon: Kerin Salen, MD;   Location: [facility];  Service: Gastroenterology;;   TONSILLECTOMY     TOTAL KNEE ARTHROPLASTY Left 07/26/2012   Procedure: TOTAL KNEE ARTHROPLASTY;  Surgeon: Loreta Ave, MD;  Location: [facility] OR;  Service: Orthopedics;  Laterality: Left;    Social History   Socioeconomic History   Marital status: Widowed    Spouse name: Not on file   Number of children: 3   Years of education: Not on file   Highest education level: Not on file  Occupational History   Not on file  Tobacco Use   Smoking status: Never   Smokeless tobacco: Never  Vaping Use   Vaping Use: Never used  Substance and Sexual Activity   Alcohol use: No   Drug use: No   Sexual activity: Not on file  Other Topics Concern   Not on file  Social History Narrative   Not on file   Social Determinants of Health   Financial Resource Strain: Not on file  Food Insecurity: Not on file  Transportation Needs: Not on file  Physical Activity: Not on file  Stress: Not on file  Social Connections: Not on file  Intimate Partner Violence: Not on file    Family History  Problem Relation Age of Onset   Alcohol abuse Father    Breast cancer Sister    Cancer Sister        bladder   Glaucoma Sister    Arthritis Daughter    Diabetes Daughter        type 2   Cancer Daughter        brain cancer, diagnosed 29 years.agp   Mental illness Daughter        s/p craniotomies, gamma knife for tumors. numerous shunts.   Heart failure Mother    Cancer Brother        glioblastoma    Glaucoma Brother    Diverticulosis Sister    Cancer Sister        liver   Cancer Brother        melanoma, mets to lung and brain and intestine   Alcohol abuse Brother    Cancer Brother        liver   Glaucoma Maternal Grandfather    Cancer Sister        breast   Colon cancer Neg Hx    Hypertension Neg Hx     ROS: no fevers or chills, productive cough, hemoptysis, dysphasia, odynophagia, melena, hematochezia, dysuria, hematuria, rash, seizure  activity, orthopnea, PND, claudication. Remaining systems are negative.  Physical Exam: Well-developed well-nourished in no acute distress.  Skin is warm and dry.  HEENT is normal.  Neck is supple.  Chest is clear to auscultation with normal expansion.  Cardiovascular exam is regular rate and rhythm; 2/6 systolic murmur. Abdominal exam nontender or distended. No masses palpated. Extremities show trace edema. neuro grossly  intact  A/P  1 paroxysmal atrial fibrillation-patient is in sinus rhythm on examination today.  Continue verapamil for rate control if atrial fibrillation recurs.  Continue apixaban.  2 chronic diastolic congestive heart failure-patient became volume overloaded during recent admission for cholecystitis.  This improved with diuresis.  She now feels back to normal.  We will recheck potassium, renal function and magnesium.  Continue Lasix at present dose.  3 hypertension-patient's blood pressure is controlled.  Continue present medical regimen.  4 hyperlipidemia-recheck liver functions.  If normalized which I expect following recent cholecystectomy we will resume statin.  [name], MD

## 2020-08-03 ENCOUNTER — Other Ambulatory Visit: Payer: Self-pay | Admitting: Family Medicine

## 2020-08-05 ENCOUNTER — Other Ambulatory Visit: Payer: Self-pay | Admitting: Family Medicine

## 2020-08-05 ENCOUNTER — Telehealth: Payer: Self-pay

## 2020-08-05 DIAGNOSIS — I509 Heart failure, unspecified: Secondary | ICD-10-CM

## 2020-08-05 NOTE — Telephone Encounter (Signed)
LVM to call back.

## 2020-08-05 NOTE — Telephone Encounter (Signed)
Pt called today saying she had a follow up visit with the MD that took out her gallbladder and they are wanting her to have some labs done.  Pt is wanting to know if those labs can be done here.  I advised pt we do not do labs for other providers in our office.  Pt is wanting a return call from office regarding this.

## 2020-08-06 ENCOUNTER — Ambulatory Visit (INDEPENDENT_AMBULATORY_CARE_PROVIDER_SITE_OTHER): Payer: Medicare Other | Admitting: Cardiology

## 2020-08-06 ENCOUNTER — Other Ambulatory Visit: Payer: Self-pay

## 2020-08-06 ENCOUNTER — Encounter: Payer: Self-pay | Admitting: Cardiology

## 2020-08-06 VITALS — BP 130/58 | HR 62 | Ht 62.0 in | Wt 183.4 lb

## 2020-08-06 DIAGNOSIS — I5032 Chronic diastolic (congestive) heart failure: Secondary | ICD-10-CM

## 2020-08-06 DIAGNOSIS — I480 Paroxysmal atrial fibrillation: Secondary | ICD-10-CM

## 2020-08-06 DIAGNOSIS — I10 Essential (primary) hypertension: Secondary | ICD-10-CM

## 2020-08-06 DIAGNOSIS — E7800 Pure hypercholesterolemia, unspecified: Secondary | ICD-10-CM

## 2020-08-06 NOTE — Patient Instructions (Signed)

## 2020-08-07 ENCOUNTER — Telehealth: Payer: Self-pay | Admitting: *Deleted

## 2020-08-07 ENCOUNTER — Encounter: Payer: Self-pay | Admitting: *Deleted

## 2020-08-07 DIAGNOSIS — E7800 Pure hypercholesterolemia, unspecified: Secondary | ICD-10-CM

## 2020-08-07 LAB — COMPREHENSIVE METABOLIC PANEL
ALT: 27 IU/L (ref 0–32)
AST: 23 IU/L (ref 0–40)
Albumin/Globulin Ratio: 1.4 (ref 1.2–2.2)
Albumin: 3.5 g/dL — ABNORMAL LOW (ref 3.6–4.6)
Alkaline Phosphatase: 101 IU/L (ref 44–121)
BUN/Creatinine Ratio: 13 (ref 12–28)
BUN: 13 mg/dL (ref 8–27)
Bilirubin Total: 0.9 mg/dL (ref 0.0–1.2)
CO2: 24 mmol/L (ref 20–29)
Calcium: 8.5 mg/dL — ABNORMAL LOW (ref 8.7–10.3)
Chloride: 100 mmol/L (ref 96–106)
Creatinine, Ser: 1.03 mg/dL — ABNORMAL HIGH (ref 0.57–1.00)
Globulin, Total: 2.5 g/dL (ref 1.5–4.5)
Glucose: 110 mg/dL — ABNORMAL HIGH (ref 65–99)
Potassium: 4 mmol/L (ref 3.5–5.2)
Sodium: 138 mmol/L (ref 134–144)
Total Protein: 6 g/dL (ref 6.0–8.5)
eGFR: 53 mL/min/{1.73_m2} — ABNORMAL LOW (ref >59)

## 2020-08-07 LAB — MAGNESIUM: Magnesium: 1.9 mg/dL (ref 1.6–2.3)

## 2020-08-07 MED ORDER — ATORVASTATIN CALCIUM 40 MG PO TABS: 40.0000 mg | ORAL_TABLET | Freq: Every day | ORAL | 3 refills | Status: DC

## 2020-08-07 NOTE — Telephone Encounter (Signed)
-----   Message from Lewayne Bunting, MD sent at 08/07/2020  7:32 AM EDT ----- Resume lipitor 40 mg daily; lipids and liver 12 weeks [name]  ----- Message ----- From: Interface, [facility] Lab Results In Sent: 08/07/2020   5:38 AM EDT To: Lewayne Bunting, MD

## 2020-08-07 NOTE — Telephone Encounter (Signed)
-----   Message from Brian S Crenshaw, MD sent at 08/07/2020  7:32 AM EDT ----- Resume lipitor 40 mg daily; lipids and liver 12 weeks Brian Crenshaw  ----- Message ----- From: Interface, [facility] Lab Results In Sent: 08/07/2020   5:38 AM EDT To: Brian S Crenshaw, MD   

## 2020-08-07 NOTE — Telephone Encounter (Signed)
Left message of results for pt  New script sent to the pharmacy  Lab orders mailed to the pt  

## 2020-08-07 NOTE — Telephone Encounter (Signed)
This encounter was created in error - please disregard.

## 2020-08-11 NOTE — Telephone Encounter (Signed)
Looks like pt sees Dr. Marca Ancona at [facility].  Patient declined office visit.

## 2020-08-12 NOTE — Telephone Encounter (Signed)
Pt states that she could want until her next physical or check up

## 2020-08-14 ENCOUNTER — Other Ambulatory Visit: Payer: Self-pay | Admitting: Family Medicine

## 2020-08-14 ENCOUNTER — Telehealth: Payer: Self-pay | Admitting: Family Medicine

## 2020-08-14 NOTE — Telephone Encounter (Signed)
Medication:  ALPRAZolam (XANAX) 0.25 MG tablet [[id number]]     Has the patient contacted their pharmacy? Yes.   (If no, request that the patient contact the pharmacy for the refill.) (If yes, when and what did the pharmacy advise?) Call your doctor    Preferred Pharmacy (with phone number or street name):  [facility] [address] [place], [place] - [address]  [address], [place] [address]  Phone:  [phone]  Fax:  [phone]    Agent: Please be advised that RX refills may take up to 3 business days. We ask that you follow-up with your pharmacy.

## 2020-08-14 NOTE — Telephone Encounter (Signed)
Requesting:xanax 0.25mg   Contract:07/08/20 UDS:07/08/20 Last Visit:07/08/20 Next Visit:01/29/21 Last Refill:06/05/20  Please Advise

## 2020-08-15 ENCOUNTER — Other Ambulatory Visit: Payer: Self-pay | Admitting: Family Medicine

## 2020-08-15 DIAGNOSIS — I10 Essential (primary) hypertension: Secondary | ICD-10-CM

## 2020-08-15 MED ORDER — ALPRAZOLAM 0.25 MG PO TABS: 0.2500 mg | ORAL_TABLET | Freq: Every day | ORAL | 3 refills | Status: DC | PRN

## 2020-08-28 ENCOUNTER — Telehealth: Payer: Self-pay

## 2020-08-28 NOTE — Telephone Encounter (Signed)
Pt states that she feeling better and she will give it one more day to see if she gets any others symptoms.

## 2020-08-28 NOTE — Telephone Encounter (Signed)
Patient called stating she started experiencing a runny nose, scratchy throat, sinus pressure started earlier this week. She took a home covid test yesterday 7/6 and it was positive.   She states she feels like the worst of it is over, but she would like to know what to do now.   Call back [phone].

## 2020-08-29 ENCOUNTER — Telehealth (INDEPENDENT_AMBULATORY_CARE_PROVIDER_SITE_OTHER): Payer: Medicare Other | Admitting: Family Medicine

## 2020-08-29 ENCOUNTER — Other Ambulatory Visit: Payer: Self-pay

## 2020-08-29 DIAGNOSIS — R739 Hyperglycemia, unspecified: Secondary | ICD-10-CM | POA: Diagnosis not present

## 2020-08-29 DIAGNOSIS — I10 Essential (primary) hypertension: Secondary | ICD-10-CM | POA: Diagnosis not present

## 2020-08-29 DIAGNOSIS — U071 COVID-19: Secondary | ICD-10-CM | POA: Diagnosis not present

## 2020-08-29 MED ORDER — NIRMATRELVIR/RITONAVIR (PAXLOVID)TABLET: ORAL_TABLET | ORAL | 0 refills | Status: DC

## 2020-08-29 NOTE — Telephone Encounter (Signed)
Left detailed message on machine for patient to call back for vv.

## 2020-09-01 NOTE — Progress Notes (Signed)
Virtual telephone visit    Virtual Visit via Telephone Note   This visit type was conducted due to national recommendations for restrictions regarding the COVID-19 Pandemic (e.g. social distancing) in an effort to limit this patient's exposure and mitigate transmission in our community. Due to her co-morbid illnesses, this patient is at least at moderate risk for complications without adequate follow up. This format is felt to be most appropriate for this patient at this time. The patient did not have access to video technology or had technical difficulties with video requiring transitioning to audio format only (telephone). Physical exam was limited to content and character of the telephone converstion. Nena Alexander, CMA was able to get the patient set up on a telephone visit.   Patient location: home Patient and provider in visit Provider location: Office  I discussed the limitations of evaluation and management by telemedicine and the availability of in person appointments. The patient expressed understanding and agreed to proceed.   Visit Date: 08/29/2020  Today's healthcare provider: Penni Homans, MD     Subjective:    Patient ID: Alyssa Williamson, female    DOB: [birth year], 87 y.o.   MRN: [id number]  Chief Complaint  Patient presents with   Covid Positive    HPI Patient is in today for evaluaiton of COVID 19. She began being symptomatic on 08/25/2020 and tested positive on 08/27/2020. She notes fatigue, malaise, cough, congestion. She denies any concerns thus far . No recent febrile illness or hospitalizations. She is trying to eat well and stay active. Denies CP/palp/SOB/HAfevers/GI or GU c/o. Taking meds as prescribed  Past Medical History:  Diagnosis Date   Allergic rhinitis    Anemia    Anxiety    Arthritis    bilateral knee   Atrial fibrillation ([place]) 12/21/2016   Cervical cancer screening 10/30/2013   Menarche at 15 Regular and moderate flow No history of abnormal pap  in past G4P3, s/p 3 svd and 1 MC No history of abnormal MGM Noconcerns today No gyn surgeries   Constipation 11/18/2015   Depression with anxiety    Diverticulitis    Heart murmur    Hyperlipidemia    Hypertension    Hypothyroid    Left knee DJD    Medicare annual wellness visit, subsequent 10/30/2013   PONV (postoperative nausea and vomiting)    Preventative health care 05/20/2016    Past Surgical History:  Procedure Laterality Date   ABDOMINAL HYSTERECTOMY     bladder tack  04/2010   Dr Dinah Beers   ERCP N/A 07/16/2020   Procedure: ENDOSCOPIC RETROGRADE CHOLANGIOPANCREATOGRAPHY (ERCP);  Surgeon: Ronnette Juniper, MD;  Location: [facility];  Service: Gastroenterology;  Laterality: N/A;   GANGLION CYST EXCISION     JOINT REPLACEMENT Right    REMOVAL OF STONES  07/16/2020   Procedure: REMOVAL OF STONES;  Surgeon: Ronnette Juniper, MD;  Location: [facility];  Service: Gastroenterology;;   REPLACEMENT TOTAL KNEE  10/2010   right knee-[facility]    SPHINCTEROTOMY  07/16/2020   Procedure: SPHINCTEROTOMY;  Surgeon: Ronnette Juniper, MD;  Location: [facility];  Service: Gastroenterology;;   TONSILLECTOMY     TOTAL KNEE ARTHROPLASTY Left 07/26/2012   Procedure: TOTAL KNEE ARTHROPLASTY;  Surgeon: Ninetta Lights, MD;  Location: [facility];  Service: Orthopedics;  Laterality: Left;    Family History  Problem Relation Age of Onset   Alcohol abuse Father    Breast cancer Sister    Cancer Sister  bladder   Glaucoma Sister    Arthritis Daughter    Diabetes Daughter        type 2   Cancer Daughter        brain cancer, diagnosed 48 years.agp   Mental illness Daughter        s/p craniotomies, gamma knife for tumors. numerous shunts.   Heart failure Mother    Cancer Brother        glioblastoma    Glaucoma Brother    Diverticulosis Sister    Cancer Sister        liver   Cancer Brother        melanoma, mets to lung and brain and intestine   Alcohol abuse Brother    Cancer Brother        liver    Glaucoma Maternal Grandfather    Cancer Sister        breast   Colon cancer Neg Hx    Hypertension Neg Hx     Social History   Socioeconomic History   Marital status: Widowed    Spouse name: Not on file   Number of children: 3   Years of education: Not on file   Highest education level: Not on file  Occupational History   Not on file  Tobacco Use   Smoking status: Never   Smokeless tobacco: Never  Vaping Use   Vaping Use: Never used  Substance and Sexual Activity   Alcohol use: No   Drug use: No   Sexual activity: Not on file  Other Topics Concern   Not on file  Social History Narrative   Not on file   Social Determinants of Health   Financial Resource Strain: Not on file  Food Insecurity: Not on file  Transportation Needs: Not on file  Physical Activity: Not on file  Stress: Not on file  Social Connections: Not on file  Intimate Partner Violence: Not on file    Outpatient Medications Prior to Visit  Medication Sig Dispense Refill   acetaminophen (TYLENOL) 325 MG tablet Take 2 tablets (650 mg total) by mouth every 6 (six) hours as needed for mild pain (or Fever >/= 101).     albuterol (VENTOLIN HFA) 108 (90 Base) MCG/ACT inhaler Inhale 1 puff into the lungs every 6 (six) hours as needed for wheezing or shortness of breath.     ALPRAZolam (XANAX) 0.25 MG tablet Take 1 tablet (0.25 mg total) by mouth daily as needed for anxiety. 30 tablet 3   atorvastatin (LIPITOR) 40 MG tablet Take 1 tablet (40 mg total) by mouth daily. 90 tablet 3   budesonide-formoterol (SYMBICORT) 160-4.5 MCG/ACT inhaler Inhale 2 puffs into the lungs 2 (two) times daily. (Patient taking differently: Inhale 2 puffs into the lungs 2 (two) times daily as needed (shortness of breath or wheezing).) 1 each 3   citalopram (CELEXA) 10 MG tablet Take 10 mg by mouth daily.     citalopram (CELEXA) 20 MG tablet Take 1 tablet (20 mg total) by mouth daily. 90 tablet 1   COVID-19 mRNA Vac-TriS, Pfizer,  (PFIZER-BIONT COVID-19 VAC-TRIS) SUSP injection Inject into the muscle. 0.3 mL 0   Cranberry 500 MG CAPS Take 500 mg by mouth daily.     ELIQUIS 5 MG TABS tablet Take 1 tablet by mouth twice daily (Patient taking differently: Take 5 mg by mouth 2 (two) times daily.) 180 tablet 1   EUTHYROX 75 MCG tablet TAKE 1 TABLET BY MOUTH ONCE DAILY BEFORE BREAKFAST  90 tablet 0   furosemide (LASIX) 20 MG tablet Take 1 tablet by mouth once daily 90 tablet 1   levothyroxine (SYNTHROID) 50 MCG tablet Take 50 mcg by mouth daily before breakfast.     loratadine (CLARITIN) 10 MG tablet Take 10 mg by mouth daily.     Multiple Vitamins-Minerals (CENTRUM SILVER PO) Take 1 tablet by mouth daily.     verapamil (CALAN-SR) 240 MG CR tablet Take 1 tablet (240 mg total) by mouth daily. 90 tablet 1   No facility-administered medications prior to visit.    Allergies  Allergen Reactions   Cefdinir Dermatitis   Codeine Nausea And Vomiting    Review of Systems  Constitutional:  Positive for malaise/fatigue. Negative for fever.  HENT:  Positive for congestion and sinus pain. Negative for ear discharge.   Eyes:  Negative for blurred vision.  Respiratory:  Positive for cough and shortness of breath.   Cardiovascular:  Negative for chest pain, palpitations and leg swelling.  Gastrointestinal:  Negative for abdominal pain, blood in stool and nausea.  Genitourinary:  Negative for dysuria and frequency.  Musculoskeletal:  Negative for falls.  Skin:  Negative for rash.  Neurological:  Negative for dizziness, loss of consciousness and headaches.  Endo/Heme/Allergies:  Negative for environmental allergies.  Psychiatric/Behavioral:  Negative for depression. The patient is not nervous/anxious.       Objective:    Physical Exam  There were no vitals taken for this visit. Wt Readings from Last 3 Encounters:  08/06/20 183 lb 6.4 oz (83.2 kg)  07/20/20 199 lb 15.3 oz (90.7 kg)  07/08/20 185 lb 6.4 oz (84.1 kg)     Diabetic Foot Exam - Simple   No data filed    Lab Results  Component Value Date   WBC 7.8 07/20/2020   HGB 11.3 (L) 07/20/2020   HCT 34.2 (L) 07/20/2020   PLT 176 07/20/2020   GLUCOSE 110 (H) 08/06/2020   CHOL 154 06/26/2020   TRIG 65.0 06/26/2020   HDL 73.90 06/26/2020   LDLCALC 67 06/26/2020   ALT 27 08/06/2020   AST 23 08/06/2020   NA 138 08/06/2020   K 4.0 08/06/2020   CL 100 08/06/2020   CREATININE 1.03 (H) 08/06/2020   BUN 13 08/06/2020   CO2 24 08/06/2020   TSH 0.37 06/26/2020   INR 0.99 07/24/2012   HGBA1C 5.9 06/26/2020    Lab Results  Component Value Date   TSH 0.37 06/26/2020   Lab Results  Component Value Date   WBC 7.8 07/20/2020   HGB 11.3 (L) 07/20/2020   HCT 34.2 (L) 07/20/2020   MCV 98.3 07/20/2020   PLT 176 07/20/2020   Lab Results  Component Value Date   NA 138 08/06/2020   K 4.0 08/06/2020   CO2 24 08/06/2020   GLUCOSE 110 (H) 08/06/2020   BUN 13 08/06/2020   CREATININE 1.03 (H) 08/06/2020   BILITOT 0.9 08/06/2020   ALKPHOS 101 08/06/2020   AST 23 08/06/2020   ALT 27 08/06/2020   PROT 6.0 08/06/2020   ALBUMIN 3.5 (L) 08/06/2020   CALCIUM 8.5 (L) 08/06/2020   ANIONGAP 4 (L) 07/20/2020   EGFR 53 (L) 08/06/2020   GFR 54.64 (L) 07/08/2020   Lab Results  Component Value Date   CHOL 154 06/26/2020   Lab Results  Component Value Date   HDL 73.90 06/26/2020   Lab Results  Component Value Date   LDLCALC 67 06/26/2020   Lab Results  Component Value Date  TRIG 65.0 06/26/2020   Lab Results  Component Value Date   CHOLHDL 2 06/26/2020   Lab Results  Component Value Date   HGBA1C 5.9 06/26/2020       Assessment & Plan:   Problem List Items Addressed This Visit     HTN (hypertension)    Denies CP/palp/SOB/HA/congestion/fevers/GI or GU c/o. Taking meds as prescribed            Hyperglycemia    hgba1c acceptable, minimize simple carbs. Increase exercise as tolerated.       COVID-19    She began having  symptoms on 7/4 and tested positive on 7/6. She notes congestion, cough, fatigue and myalgias but overall she feels she is managing well. Given her advanced age she agrees to a course of Paxlovid which is sent to [facility]       Relevant Medications   nirmatrelvir/ritonavir EUA (PAXLOVID) TABS    I am having Alyssa Williamson start on nirmatrelvir/ritonavir EUA. I am also having her maintain her Multiple Vitamins-Minerals (CENTRUM SILVER PO), Cranberry, loratadine, verapamil, citalopram, budesonide-formoterol, Eliquis, Pfizer-BioNT COVID-19 Vac-TriS, acetaminophen, Euthyrox, furosemide, albuterol, levothyroxine, citalopram, atorvastatin, and ALPRAZolam.  Meds ordered this encounter  Medications   nirmatrelvir/ritonavir EUA (PAXLOVID) TABS    Sig: (Take nirmatrelvir 150 mg two tablets twice daily for 5 days and ritonavir 100 mg one tablet twice daily for 5 days) Patient GFR is 53    Dispense:  30 tablet    Refill:  0     I discussed the assessment and treatment plan with the patient. The patient was provided an opportunity to ask questions and all were answered. The patient agreed with the plan and demonstrated an understanding of the instructions.   The patient was advised to call back or seek an in-person evaluation if the symptoms worsen or if the condition fails to improve as anticipated.  I provided 15 minutes of non-face-to-face time during this encounter.   Penni Homans, MD [facility] at [facility] [phone] (phone) [phone] (fax)  [place]

## 2020-09-01 NOTE — Assessment & Plan Note (Signed)
Denies CP/palp/SOB/HA/congestion/fevers/GI or GU c/o. Taking meds as prescribed 

## 2020-09-01 NOTE — Assessment & Plan Note (Signed)
She began having symptoms on 7/4 and tested positive on 7/6. She notes congestion, cough, fatigue and myalgias but overall she feels she is managing well. Given her advanced age she agrees to a course of Paxlovid which is sent to [facility]

## 2020-09-01 NOTE — Assessment & Plan Note (Signed)
hgba1c acceptable, minimize simple carbs. Increase exercise as tolerated.  

## 2020-09-02 ENCOUNTER — Telehealth: Payer: Medicare Other | Admitting: Family Medicine

## 2020-09-04 ENCOUNTER — Telehealth: Payer: Self-pay

## 2020-09-04 NOTE — Telephone Encounter (Signed)
Spoke w/ Pt- informed of PCP recommendations.  

## 2020-09-04 NOTE — Telephone Encounter (Signed)
Pt called and states feeling better but states still having a lot of mucus and states lymph nodes in her neck are very sore. Pt would like to know if she could take something OTC or if something could be RX. Please advise

## 2020-09-26 ENCOUNTER — Other Ambulatory Visit: Payer: Self-pay | Admitting: Family Medicine

## 2020-10-13 ENCOUNTER — Other Ambulatory Visit: Payer: Self-pay | Admitting: Family Medicine

## 2020-10-23 ENCOUNTER — Ambulatory Visit (INDEPENDENT_AMBULATORY_CARE_PROVIDER_SITE_OTHER): Payer: Medicare Other | Admitting: Family

## 2020-10-23 ENCOUNTER — Other Ambulatory Visit: Payer: Self-pay

## 2020-10-23 ENCOUNTER — Telehealth: Payer: Self-pay | Admitting: Family Medicine

## 2020-10-23 VITALS — BP 140/60 | Temp 97.9°F | Ht 62.0 in | Wt 185.2 lb

## 2020-10-23 DIAGNOSIS — B029 Zoster without complications: Secondary | ICD-10-CM

## 2020-10-23 MED ORDER — VALACYCLOVIR HCL 1 G PO TABS: 1000.0000 mg | ORAL_TABLET | Freq: Three times a day (TID) | ORAL | 0 refills | Status: DC

## 2020-10-23 NOTE — Telephone Encounter (Signed)
Appointment schedule for today at 120pm with Ria Clock.

## 2020-10-23 NOTE — Telephone Encounter (Signed)
Pt states she went to urgent care and was given prednazone and methozarbrm for her pulled muscle. She believed she was having an allergic reaction to the meds but now she thinks she has shingles. She has taken benadryl but its more of a rash, not really itching her.  Please advise.

## 2020-10-23 NOTE — Patient Instructions (Signed)

## 2020-10-23 NOTE — Progress Notes (Signed)
Alyssa Williamson is a 87 y.o. female with the following history as recorded in EpicCare:  Patient Active Problem List   Diagnosis Date Noted   COVID-19 08/29/2020   S/P cholecystectomy-Xi robot 07/18/2020   Acute biliary pancreatitis    AKI (acute kidney injury) (HCC)    Elevated LFTs    Acute lower UTI    Depression    Choledocholithiasis    Cholecystitis 07/14/2020   Acute on chronic diastolic CHF (congestive heart failure) (HCC) 07/14/2020   Abdominal pain 07/08/2020   Right shoulder pain 06/27/2020   Fall 07/10/2017   PND (post-nasal drip) 04/04/2017   Hypocalcemia 12/26/2016   Atrial fibrillation (HCC) 12/21/2016   Preventative health care 05/20/2016   Constipation 11/18/2015   History of thyroid nodule 07/24/2014   Allergic rhinitis 07/05/2014   Atypical chest pain 11/04/2013   Other malaise and fatigue 10/30/2013   Medicare annual wellness visit, subsequent 10/30/2013   Cervical cancer screening 10/30/2013   Hyperglycemia 05/23/2013   Hyponatremia 07/28/2012   Diverticulitis    Arthritis    Heart murmur    Anxiety    Anemia    Arthritis of knee 05/24/2012   UTI (lower urinary tract infection) 08/24/2011   Hypothyroidism 06/27/2011   HTN (hypertension) 06/27/2011   Hyperlipidemia 06/27/2011   Recurrent UTI 06/27/2011   Murmur 06/27/2011    Current Outpatient Medications  Medication Sig Dispense Refill   acetaminophen (TYLENOL) 325 MG tablet Take 2 tablets (650 mg total) by mouth every 6 (six) hours as needed for mild pain (or Fever >/= 101).     ALPRAZolam (XANAX) 0.25 MG tablet Take 1 tablet (0.25 mg total) by mouth daily as needed for anxiety. 30 tablet 3   atorvastatin (LIPITOR) 40 MG tablet Take 1 tablet (40 mg total) by mouth daily. 90 tablet 3   budesonide-formoterol (SYMBICORT) 160-4.5 MCG/ACT inhaler Inhale 2 puffs into the lungs 2 (two) times daily. (Patient taking differently: Inhale 2 puffs into the lungs 2 (two) times daily as needed (shortness of  breath or wheezing).) 1 each 3   Cranberry 500 MG CAPS Take 500 mg by mouth daily.     ELIQUIS 5 MG TABS tablet Take 1 tablet by mouth twice daily (Patient taking differently: Take 5 mg by mouth 2 (two) times daily.) 180 tablet 1   EUTHYROX 75 MCG tablet TAKE 1 TABLET BY MOUTH ONCE DAILY BEFORE BREAKFAST 90 tablet 0   furosemide (LASIX) 20 MG tablet Take 1 tablet by mouth once daily 90 tablet 1   levothyroxine (SYNTHROID) 50 MCG tablet Take 50 mcg by mouth daily before breakfast.     loratadine (CLARITIN) 10 MG tablet Take 10 mg by mouth daily.     Multiple Vitamins-Minerals (CENTRUM SILVER PO) Take 1 tablet by mouth daily.     valACYclovir (VALTREX) 1000 MG tablet Take 1 tablet (1,000 mg total) by mouth 3 (three) times daily. 21 tablet 0   verapamil (CALAN-SR) 240 MG CR tablet Take 1 tablet by mouth once daily 90 tablet 0   albuterol (VENTOLIN HFA) 108 (90 Base) MCG/ACT inhaler Inhale 1 puff into the lungs every 6 (six) hours as needed for wheezing or shortness of breath. (Patient not taking: Reported on 10/23/2020)     citalopram (CELEXA) 10 MG tablet Take 10 mg by mouth daily. (Patient not taking: Reported on 10/23/2020)     citalopram (CELEXA) 20 MG tablet Take 1 tablet by mouth once daily (Patient not taking: Reported on 10/23/2020) 90 tablet 0  No current facility-administered medications for this visit.    Allergies: Cefdinir and Codeine  Past Medical History:  Diagnosis Date   Allergic rhinitis    Anemia    Anxiety    Arthritis    bilateral knee   Atrial fibrillation (HCC) 12/21/2016   Cervical cancer screening 10/30/2013   Menarche at 15 Regular and moderate flow No history of abnormal pap in past G4P3, s/p 3 svd and 1 MC No history of abnormal MGM Noconcerns today No gyn surgeries   Constipation 11/18/2015   Depression with anxiety    Diverticulitis    Heart murmur    Hyperlipidemia    Hypertension    Hypothyroid    Left knee DJD    Medicare annual wellness visit, subsequent  10/30/2013   PONV (postoperative nausea and vomiting)    Preventative health care 05/20/2016    Past Surgical History:  Procedure Laterality Date   ABDOMINAL HYSTERECTOMY     bladder tack  04/2010   Dr Marciano Sequin   ERCP N/A 07/16/2020   Procedure: ENDOSCOPIC RETROGRADE CHOLANGIOPANCREATOGRAPHY (ERCP);  Surgeon: Kerin Salen, MD;  Location: [facility];  Service: Gastroenterology;  Laterality: N/A;   GANGLION CYST EXCISION     JOINT REPLACEMENT Right    REMOVAL OF STONES  07/16/2020   Procedure: REMOVAL OF STONES;  Surgeon: Kerin Salen, MD;  Location: [facility];  Service: Gastroenterology;;   REPLACEMENT TOTAL KNEE  10/2010   right knee-[facility]    SPHINCTEROTOMY  07/16/2020   Procedure: SPHINCTEROTOMY;  Surgeon: Kerin Salen, MD;  Location: [facility];  Service: Gastroenterology;;   TONSILLECTOMY     TOTAL KNEE ARTHROPLASTY Left 07/26/2012   Procedure: TOTAL KNEE ARTHROPLASTY;  Surgeon: Loreta Ave, MD;  [facility];  Service: Orthopedics;  Laterality: Left;    Family History  Problem Relation Age of Onset   Alcohol abuse Father    Breast cancer Sister    Cancer Sister        bladder   Glaucoma Sister    Arthritis Daughter    Diabetes Daughter        type 2   Cancer Daughter        brain cancer, diagnosed 29 years.agp   Mental illness Daughter        s/p craniotomies, gamma knife for tumors. numerous shunts.   Heart failure Mother    Cancer Brother        glioblastoma    Glaucoma Brother    Diverticulosis Sister    Cancer Sister        liver   Cancer Brother        melanoma, mets to lung and brain and intestine   Alcohol abuse Brother    Cancer Brother        liver   Glaucoma Maternal Grandfather    Cancer Sister        breast   Colon cancer Neg Hx    Hypertension Neg Hx     Social History   Tobacco Use   Smoking status: Never   Smokeless tobacco: Never  Substance Use Topics   Alcohol use: No    Subjective:  Started with rash on upper left  shoulder/ left arm earlier in the week; is concerned that having allergic reaction to muscle relaxer that she was given by orthopedist; No pain but notes she just finished course of prednisone 2 days; feels slight "burning" when a new bump develops.     Objective:  Vitals:  10/23/20 1332  BP: 140/60  Temp: 97.9 F (36.6 C)  TempSrc: Oral  Weight: 185 lb 3.2 oz (84 kg)  Height: 5\' 2"  (1.575 m)    General: Well developed, well nourished, in no acute distress  Skin : Warm and dry. Vesicular lesions noted on upper left extremity;  Head: Normocephalic and atraumatic  Eyes: Sclera and conjunctiva clear; pupils round and reactive to light; extraocular movements intact  Ears: External normal; canals clear; tympanic membranes normal  Oropharynx: Pink, supple. No suspicious lesions  Neck: Supple without thyromegaly, adenopathy  Lungs: Respirations unlabored;  Neurologic: Alert and oriented; speech intact; face symmetrical; moves all extremities well; CNII-XII intact without focal deficit   Assessment:  1. Herpes zoster without complication     Plan:   Rx for Valtrex 1 gm tid x 7 days; will hold prednisone for now; follow up worse, no better.   This visit occurred during the SARS-CoV-2 public health emergency.  Safety protocols were in place, including screening questions prior to the visit, additional usage of staff PPE, and extensive cleaning of exam room while observing appropriate contact time as indicated for disinfecting solutions.    No follow-ups on file.  No orders of the defined types were placed in this encounter.   Requested Prescriptions   Signed Prescriptions Disp Refills   valACYclovir (VALTREX) 1000 MG tablet 21 tablet 0    Sig: Take 1 tablet (1,000 mg total) by mouth 3 (three) times daily.

## 2020-11-03 ENCOUNTER — Other Ambulatory Visit: Payer: Self-pay | Admitting: Family Medicine

## 2020-11-11 ENCOUNTER — Other Ambulatory Visit: Payer: Self-pay

## 2020-11-11 ENCOUNTER — Ambulatory Visit: Payer: Medicare Other | Admitting: Family Medicine

## 2020-11-11 ENCOUNTER — Other Ambulatory Visit: Payer: Self-pay | Admitting: Family Medicine

## 2020-11-11 ENCOUNTER — Telehealth: Payer: Self-pay | Admitting: Family Medicine

## 2020-11-11 MED ORDER — GABAPENTIN 100 MG PO CAPS: 100.0000 mg | ORAL_CAPSULE | Freq: Two times a day (BID) | ORAL | 1 refills | Status: DC

## 2020-11-11 NOTE — Telephone Encounter (Signed)
Pt aware and medication sent in 

## 2020-11-11 NOTE — Telephone Encounter (Signed)
Patient is calling in regarding shingles. She would like meds that will help calm down instant burning and itching. Please advise.

## 2020-11-19 ENCOUNTER — Encounter: Payer: Self-pay | Admitting: *Deleted

## 2020-12-02 LAB — LIPID PANEL
Chol/HDL Ratio: 2.2 ratio (ref 0.0–4.4)
Cholesterol, Total: 146 mg/dL (ref 100–199)
HDL: 65 mg/dL (ref >39)
LDL Chol Calc (NIH): 70 mg/dL (ref 0–99)
Triglycerides: 49 mg/dL (ref 0–149)
VLDL Cholesterol Cal: 11 mg/dL (ref 5–40)

## 2020-12-02 LAB — HEPATIC FUNCTION PANEL
ALT: 19 IU/L (ref 0–32)
AST: 21 IU/L (ref 0–40)
Albumin: 3.8 g/dL (ref 3.6–4.6)
Alkaline Phosphatase: 80 IU/L (ref 44–121)
Bilirubin Total: 0.8 mg/dL (ref 0.0–1.2)
Bilirubin, Direct: 0.27 mg/dL (ref 0.00–0.40)
Total Protein: 6.4 g/dL (ref 6.0–8.5)

## 2020-12-05 ENCOUNTER — Other Ambulatory Visit: Payer: Self-pay | Admitting: Cardiology

## 2020-12-05 NOTE — Telephone Encounter (Signed)
Prescription refill request for Eliquis received. Indication:Afib Last office visit:6/22 Scr:1.0 Age: 87 Weight:84 kg Prescription refilled

## 2020-12-15 ENCOUNTER — Other Ambulatory Visit: Payer: Self-pay | Admitting: Family Medicine

## 2020-12-15 ENCOUNTER — Telehealth: Payer: Self-pay | Admitting: Family Medicine

## 2020-12-15 DIAGNOSIS — I10 Essential (primary) hypertension: Secondary | ICD-10-CM

## 2020-12-15 MED ORDER — ALPRAZOLAM 0.25 MG PO TABS: 0.2500 mg | ORAL_TABLET | Freq: Every day | ORAL | 1 refills | Status: DC | PRN

## 2020-12-15 NOTE — Telephone Encounter (Signed)
Requesting: xanax 0.25 mg  Contract:07/16/20 [id number] Last Visit:10/23/20 Next Visit:01/29/21 Last Refill:08/15/20  Please Advise

## 2020-12-15 NOTE — Telephone Encounter (Signed)
Patient called back to know if she needs to be put back on an antiviral to treat her shingles since she is still feeling the burning sensation where the rash appeared. (OV on 09/21 to treat shingles) She wants to know if she can get her covid booster if the rash has not been taken care of yet.  Please advice.

## 2020-12-15 NOTE — Telephone Encounter (Signed)
Pt called wondering if she could get the covid vaccine and flu shot while on  gabapentin (NEURONTIN) 100 MG capsule   please advise.

## 2020-12-15 NOTE — Telephone Encounter (Signed)
Medication:  ALPRAZolam (XANAX) 0.25 MG tablet Has the patient contacted their pharmacy? Yes.   (If no, request that the patient contact the pharmacy for the refill.) (If yes, when and what did the pharmacy advise?)  Preferred Pharmacy (with phone number or street name): [facility] [address] [place], [place] - [address]  [address], [place] [address]  Phone:  [phone]  Fax:  [phone]   Agent: Please be advised that RX refills may take up to 3 business days. We ask that you follow-up with your pharmacy.

## 2020-12-15 NOTE — Telephone Encounter (Signed)
Lvm to call back

## 2020-12-15 NOTE — Telephone Encounter (Signed)
Requesting: alprazolam 0.25mg   Contract: 06/26/2020 UDS: 06/26/2020 Last Visit: 10/23/2020 w/ Vernona Rieger Next Visit: 01/29/2021 Last Refill: 08/15/2020 #30 and 3RF  Please Advise

## 2020-12-18 NOTE — Telephone Encounter (Signed)
Left message on machine to call back  

## 2020-12-18 NOTE — Telephone Encounter (Signed)
No new rash, no new sores or open sores.  Advised ok to do vaccines.

## 2020-12-23 ENCOUNTER — Other Ambulatory Visit: Payer: Self-pay | Admitting: Family Medicine

## 2021-01-20 ENCOUNTER — Other Ambulatory Visit: Payer: Self-pay | Admitting: Family Medicine

## 2021-01-21 ENCOUNTER — Other Ambulatory Visit: Payer: Self-pay | Admitting: Family Medicine

## 2021-01-27 NOTE — Progress Notes (Signed)
HPI: FU atrial fibrillation. Nuclear study May 2014 showed breast attenuation but no ischemia. Ejection fraction 79%. Noted to be in new onset atrial 9/18. Seen in atrial fibrillation clinic; treated with rate control and anticoagulation. TSH normal. Echocardiogram May 2022 showed ejection fraction 55 to [id number], grade 2 diastolic dysfunction, mild right atrial enlargement, mild right ventricular enlargement, mild mitral regurgitation.  Recently admitted with gallstone pancreatitis/choledocholithiasis/acute cholecystitis.  She had sphincterotomy with biliary tree sweep.  She ultimately underwent cholecystectomy.  She was diuresed for diastolic congestive heart failure as well.  Since last seen, she has occasional wheezing but denies dyspnea on exertion, orthopnea, chest pain, palpitations or syncope.  Occasional mild pedal edema.  No bleeding.  Current Outpatient Medications  Medication Sig Dispense Refill   acetaminophen (TYLENOL) 325 MG tablet Take 2 tablets (650 mg total) by mouth every 6 (six) hours as needed for mild pain (or Fever >/= 101).     albuterol (VENTOLIN HFA) 108 (90 Base) MCG/ACT inhaler Inhale 1 puff into the lungs every 6 (six) hours as needed for wheezing or shortness of breath.     ALPRAZolam (XANAX) 0.25 MG tablet Take 1 tablet (0.25 mg total) by mouth 2 (two) times daily as needed for anxiety. 45 tablet 1   apixaban (ELIQUIS) 5 MG TABS tablet Take 1 tablet by mouth twice daily 180 tablet 1   budesonide-formoterol (SYMBICORT) 160-4.5 MCG/ACT inhaler Inhale 2 puffs into the lungs 2 (two) times daily. (Patient taking differently: Inhale 2 puffs into the lungs 2 (two) times daily as needed (shortness of breath or wheezing).) 1 each 3   citalopram (CELEXA) 20 MG tablet Take 1 tablet by mouth once daily 90 tablet 1   Cranberry 500 MG CAPS Take 500 mg by mouth daily.     EUTHYROX 75 MCG tablet TAKE 1 TABLET BY MOUTH ONCE DAILY BEFORE BREAKFAST 90 tablet 0   furosemide (LASIX) 20 MG  tablet Take 1 tablet by mouth once daily 90 tablet 1   loratadine (CLARITIN) 10 MG tablet Take 10 mg by mouth daily.     Multiple Vitamins-Minerals (CENTRUM SILVER PO) Take 1 tablet by mouth daily.     sulfamethoxazole-trimethoprim (BACTRIM DS) 800-160 MG tablet Take 1 tablet by mouth 2 (two) times daily. 10 tablet 0   valACYclovir (VALTREX) 1000 MG tablet Take 1 tablet (1,000 mg total) by mouth 3 (three) times daily. 21 tablet 0   verapamil (CALAN-SR) 240 MG CR tablet Take 1 tablet by mouth once daily 90 tablet 0   atorvastatin (LIPITOR) 40 MG tablet Take 1 tablet (40 mg total) by mouth daily. 90 tablet 3   No current facility-administered medications for this visit.     Past Medical History:  Diagnosis Date   Allergic rhinitis    Anemia    Anxiety    Arthritis    bilateral knee   Atrial fibrillation ([place]) 12/21/2016   Cervical cancer screening 10/30/2013   Menarche at 15 Regular and moderate flow No history of abnormal pap in past G4P3, s/p 3 svd and 1 MC No history of abnormal MGM Noconcerns today No gyn surgeries   Constipation 11/18/2015   Depression with anxiety    Diverticulitis    Heart murmur    Hyperlipidemia    Hypertension    Hypothyroid    Left knee DJD    Medicare annual wellness visit, subsequent 10/30/2013   PONV (postoperative nausea and vomiting)    Preventative health care 05/20/2016  Past Surgical History:  Procedure Laterality Date   ABDOMINAL HYSTERECTOMY     bladder tack  04/2010   Dr Marciano Sequin   ERCP N/A 07/16/2020   Procedure: ENDOSCOPIC RETROGRADE CHOLANGIOPANCREATOGRAPHY (ERCP);  Surgeon: Kerin Salen, MD;  Location: [facility];  Service: Gastroenterology;  Laterality: N/A;   GANGLION CYST EXCISION     JOINT REPLACEMENT Right    REMOVAL OF STONES  07/16/2020   Procedure: REMOVAL OF STONES;  Surgeon: Kerin Salen, MD;  Location: [facility];  Service: Gastroenterology;;   REPLACEMENT TOTAL KNEE  10/2010   right knee-[place]    SPHINCTEROTOMY   07/16/2020   Procedure: SPHINCTEROTOMY;  Surgeon: Kerin Salen, MD;  Location: [facility];  Service: Gastroenterology;;   TONSILLECTOMY     TOTAL KNEE ARTHROPLASTY Left 07/26/2012   Procedure: TOTAL KNEE ARTHROPLASTY;  Surgeon: Loreta Ave, MD;  Location: [facility] OR;  Service: Orthopedics;  Laterality: Left;    Social History   Socioeconomic History   Marital status: Widowed    Spouse name: Not on file   Number of children: 3   Years of education: Not on file   Highest education level: Not on file  Occupational History   Not on file  Tobacco Use   Smoking status: Never   Smokeless tobacco: Never  Vaping Use   Vaping Use: Never used  Substance and Sexual Activity   Alcohol use: No   Drug use: No   Sexual activity: Not on file  Other Topics Concern   Not on file  Social History Narrative   Not on file   Social Determinants of Health   Financial Resource Strain: Not on file  Food Insecurity: Not on file  Transportation Needs: Not on file  Physical Activity: Not on file  Stress: Not on file  Social Connections: Not on file  Intimate Partner Violence: Not on file    Family History  Problem Relation Age of Onset   Alcohol abuse Father    Breast cancer Sister    Cancer Sister        bladder   Glaucoma Sister    Arthritis Daughter    Diabetes Daughter        type 2   Cancer Daughter        brain cancer, diagnosed 29 years.agp   Mental illness Daughter        s/p craniotomies, gamma knife for tumors. numerous shunts.   Heart failure Mother    Cancer Brother        glioblastoma    Glaucoma Brother    Diverticulosis Sister    Cancer Sister        liver   Cancer Brother        melanoma, mets to lung and brain and intestine   Alcohol abuse Brother    Cancer Brother        liver   Glaucoma Maternal Grandfather    Cancer Sister        breast   Colon cancer Neg Hx    Hypertension Neg Hx     ROS: no fevers or chills, productive cough, hemoptysis, dysphasia,  odynophagia, melena, hematochezia, dysuria, hematuria, rash, seizure activity, orthopnea, PND, pedal edema, claudication. Remaining systems are negative.  Physical Exam: Well-developed well-nourished in no acute distress.  Skin is warm and dry.  HEENT is normal.  Neck is supple.  Chest is clear to auscultation with normal expansion.  Cardiovascular exam is regular Abdominal exam nontender or distended. No masses palpated.  Extremities show trace edema. neuro grossly intact  A/P  1 paroxysmal atrial fibrillation-patient remains in sinus rhythm.  We will continue present dose of verapamil and apixaban.  2 chronic diastolic congestive heart failure-patient previously became volume overloaded during admission for cholecystitis. She improved with diuresis.  She is euvolemic today.  Continue present dose of Lasix.  3 hypertension-blood pressure controlled.  Continue present medications and follow-up.  4 hyperlipidemia-continue statin.  Kirk Ruths, MD

## 2021-01-29 ENCOUNTER — Ambulatory Visit (INDEPENDENT_AMBULATORY_CARE_PROVIDER_SITE_OTHER): Payer: Medicare Other | Admitting: Family Medicine

## 2021-01-29 ENCOUNTER — Encounter: Payer: Self-pay | Admitting: Family Medicine

## 2021-01-29 VITALS — BP 110/66 | HR 83 | Temp 98.3°F | Resp 16 | Ht 62.0 in | Wt 184.2 lb

## 2021-01-29 DIAGNOSIS — E7800 Pure hypercholesterolemia, unspecified: Secondary | ICD-10-CM | POA: Diagnosis not present

## 2021-01-29 DIAGNOSIS — Z0000 Encounter for general adult medical examination without abnormal findings: Secondary | ICD-10-CM

## 2021-01-29 DIAGNOSIS — N390 Urinary tract infection, site not specified: Secondary | ICD-10-CM | POA: Diagnosis not present

## 2021-01-29 DIAGNOSIS — R739 Hyperglycemia, unspecified: Secondary | ICD-10-CM

## 2021-01-29 DIAGNOSIS — E039 Hypothyroidism, unspecified: Secondary | ICD-10-CM | POA: Diagnosis not present

## 2021-01-29 DIAGNOSIS — Z23 Encounter for immunization: Secondary | ICD-10-CM

## 2021-01-29 DIAGNOSIS — Z79899 Other long term (current) drug therapy: Secondary | ICD-10-CM

## 2021-01-29 DIAGNOSIS — F32A Depression, unspecified: Secondary | ICD-10-CM

## 2021-01-29 DIAGNOSIS — F430 Acute stress reaction: Secondary | ICD-10-CM

## 2021-01-29 DIAGNOSIS — I10 Essential (primary) hypertension: Secondary | ICD-10-CM | POA: Diagnosis not present

## 2021-01-29 DIAGNOSIS — B0229 Other postherpetic nervous system involvement: Secondary | ICD-10-CM

## 2021-01-29 LAB — URINALYSIS, ROUTINE W REFLEX MICROSCOPIC
Bilirubin Urine: NEGATIVE
Hgb urine dipstick: NEGATIVE
Ketones, ur: NEGATIVE
Nitrite: POSITIVE — AB
RBC / HPF: NONE SEEN (ref 0–?)
Specific Gravity, Urine: 1.01 (ref 1.000–1.030)
Total Protein, Urine: NEGATIVE
Urine Glucose: NEGATIVE
Urobilinogen, UA: 0.2 (ref 0.0–1.0)
pH: 6.5 (ref 5.0–8.0)

## 2021-01-29 LAB — LIPID PANEL
Cholesterol: 137 mg/dL (ref 0–200)
HDL: 65.5 mg/dL (ref >39.00)
LDL Cholesterol: 58 mg/dL (ref 0–99)
NonHDL: 71.61
Total CHOL/HDL Ratio: 2
Triglycerides: 69 mg/dL (ref 0.0–149.0)
VLDL: 13.8 mg/dL (ref 0.0–40.0)

## 2021-01-29 LAB — CBC WITH DIFFERENTIAL/PLATELET
Basophils Absolute: 0.1 10*3/uL (ref 0.0–0.1)
Basophils Relative: 1 % (ref 0.0–3.0)
Eosinophils Absolute: 0.1 10*3/uL (ref 0.0–0.7)
Eosinophils Relative: 1.9 % (ref 0.0–5.0)
HCT: 39.6 % (ref 36.0–46.0)
Hemoglobin: 13.1 g/dL (ref 12.0–15.0)
Lymphocytes Relative: 25.8 % (ref 12.0–46.0)
Lymphs Abs: 1.4 10*3/uL (ref 0.7–4.0)
MCHC: 33.1 g/dL (ref 30.0–36.0)
MCV: 98.9 fl (ref 78.0–100.0)
Monocytes Absolute: 0.7 10*3/uL (ref 0.1–1.0)
Monocytes Relative: 12.5 % — ABNORMAL HIGH (ref 3.0–12.0)
Neutro Abs: 3.3 10*3/uL (ref 1.4–7.7)
Neutrophils Relative %: 58.8 % (ref 43.0–77.0)
Platelets: 205 10*3/uL (ref 150.0–400.0)
RBC: 4.01 Mil/uL (ref 3.87–5.11)
RDW: 13.5 % (ref 11.5–15.5)
WBC: 5.6 10*3/uL (ref 4.0–10.5)

## 2021-01-29 LAB — COMPREHENSIVE METABOLIC PANEL
ALT: 16 U/L (ref 0–35)
AST: 21 U/L (ref 0–37)
Albumin: 3.7 g/dL (ref 3.5–5.2)
Alkaline Phosphatase: 71 U/L (ref 39–117)
BUN: 19 mg/dL (ref 6–23)
CO2: 31 mEq/L (ref 19–32)
Calcium: 9.3 mg/dL (ref 8.4–10.5)
Chloride: 100 mEq/L (ref 96–112)
Creatinine, Ser: 1.13 mg/dL (ref 0.40–1.20)
GFR: 43.64 mL/min — ABNORMAL LOW (ref >60.00)
Glucose, Bld: 89 mg/dL (ref 70–99)
Potassium: 4.5 mEq/L (ref 3.5–5.1)
Sodium: 137 mEq/L (ref 135–145)
Total Bilirubin: 1 mg/dL (ref 0.2–1.2)
Total Protein: 6.5 g/dL (ref 6.0–8.3)

## 2021-01-29 LAB — TSH: TSH: 0.85 u[IU]/mL (ref 0.35–5.50)

## 2021-01-29 MED ORDER — ALPRAZOLAM 0.25 MG PO TABS: 0.2500 mg | ORAL_TABLET | Freq: Two times a day (BID) | ORAL | 1 refills | Status: DC | PRN

## 2021-01-29 NOTE — Assessment & Plan Note (Signed)
Encourage heart healthy diet such as MIND or DASH diet, increase exercise, avoid trans fats, simple carbohydrates and processed foods, consider a krill or fish or flaxseed oil cap daily.  °

## 2021-01-29 NOTE — Assessment & Plan Note (Signed)
Left arm, rash is gone but some discomfort in left arm but it is tolerable and it continues to improve. Encouraged to continue MVI  And add a Flaxseed Oil caps daily. Consider Shingrix shots in 6 months

## 2021-01-29 NOTE — Assessment & Plan Note (Signed)
She feels good about her decision to walk away and let her daughter take care of her own concerns. Her daughter lives in an assisted living situation and they can help her.

## 2021-01-29 NOTE — Assessment & Plan Note (Signed)
Well controlled, no changes to meds. Encouraged heart healthy diet such as the DASH diet and exercise as tolerated.  °

## 2021-01-29 NOTE — Assessment & Plan Note (Signed)
hgba1c acceptable, minimize simple carbs. Increase exercise as tolerated.  

## 2021-01-29 NOTE — Patient Instructions (Addendum)
Flaxseed oil supplement  Preventive Care 32 Years and Older, Female Preventive care refers to lifestyle choices and visits with your health care provider that can promote health and wellness. Preventive care visits are also called wellness exams. What can I expect for my preventive care visit? Counseling Your health care provider may ask you questions about your: Medical history, including: Past medical problems. Family medical history. Pregnancy and menstrual history. History of falls. Current health, including: Memory and ability to understand (cognition). Emotional well-being. Home life and relationship well-being. Sexual activity and sexual health. Lifestyle, including: Alcohol, nicotine or tobacco, and drug use. Access to firearms. Diet, exercise, and sleep habits. Work and work Statistician. Sunscreen use. Safety issues such as seatbelt and bike helmet use. Physical exam Your health care provider will check your: Height and weight. These may be used to calculate your BMI (body mass index). BMI is a measurement that tells if you are at a healthy weight. Waist circumference. This measures the distance around your waistline. This measurement also tells if you are at a healthy weight and may help predict your risk of certain diseases, such as type 2 diabetes and high blood pressure. Heart rate and blood pressure. Body temperature. Skin for abnormal spots. What immunizations do I need? Vaccines are usually given at various ages, according to a schedule. Your health care provider will recommend vaccines for you based on your age, medical history, and lifestyle or other factors, such as travel or where you work. What tests do I need? Screening Your health care provider may recommend screening tests for certain conditions. This may include: Lipid and cholesterol levels. Hepatitis C test. Hepatitis B test. HIV (human immunodeficiency virus) test. STI (sexually transmitted  infection) testing, if you are at risk. Lung cancer screening. Colorectal cancer screening. Diabetes screening. This is done by checking your blood sugar (glucose) after you have not eaten for a while (fasting). Mammogram. Talk with your health care provider about how often you should have regular mammograms. BRCA-related cancer screening. This may be done if you have a family history of breast, ovarian, tubal, or peritoneal cancers. Bone density scan. This is done to screen for osteoporosis. Talk with your health care provider about your test results, treatment options, and if necessary, the need for more tests. Follow these instructions at home: Eating and drinking  Eat a diet that includes fresh fruits and vegetables, whole grains, lean protein, and low-fat dairy products. Limit your intake of foods with high amounts of sugar, saturated fats, and salt. Take vitamin and mineral supplements as recommended by your health care provider. Do not drink alcohol if your health care provider tells you not to drink. If you drink alcohol: Limit how much you have to 0-1 drink a day. Know how much alcohol is in your drink. In the [place] one drink equals one 12 oz bottle of beer (355 mL), one 5 oz glass of wine (148 mL), or one 1 oz glass of hard liquor (44 mL). Lifestyle Brush your teeth every morning and night with fluoride toothpaste. Floss one time each day. Exercise for at least 30 minutes 5 or more days each week. Do not use any products that contain nicotine or tobacco. These products include cigarettes, chewing tobacco, and vaping devices, such as e-cigarettes. If you need help quitting, ask your health care provider. Do not use drugs. If you are sexually active, practice safe sex. Use a condom or other form of protection in order to prevent STIs. Take aspirin only  as told by your health care provider. Make sure that you understand how much to take and what form to take. Work with your health care  provider to find out whether it is safe and beneficial for you to take aspirin daily. Ask your health care provider if you need to take a cholesterol-lowering medicine (statin). Find healthy ways to manage stress, such as: Meditation, yoga, or listening to music. Journaling. Talking to a trusted person. Spending time with friends and family. Minimize exposure to UV radiation to reduce your risk of skin cancer. Safety Always wear your seat belt while driving or riding in a vehicle. Do not drive: If you have been drinking alcohol. Do not ride with someone who has been drinking. When you are tired or distracted. While texting. If you have been using any mind-altering substances or drugs. Wear a helmet and other protective equipment during sports activities. If you have firearms in your house, make sure you follow all gun safety procedures. What's next? Visit your health care provider once a year for an annual wellness visit. Ask your health care provider how often you should have your eyes and teeth checked. Stay up to date on all vaccines. This information is not intended to replace advice given to you by your health care provider. Make sure you discuss any questions you have with your health care provider. Document Revised: 08/06/2020 Document Reviewed: 08/06/2020 Elsevier Patient Education  [place].
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
## 2021-01-29 NOTE — Assessment & Plan Note (Signed)
Patient encouraged to maintain heart healthy diet, regular exercise, adequate sleep. Consider daily probiotics. Take medications as prescribed. Labs ordered and reviewed 

## 2021-01-29 NOTE — Assessment & Plan Note (Signed)
Supplement and monitor 
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
## 2021-01-29 NOTE — Assessment & Plan Note (Signed)
On Levothyroxine, continue to monitor 

## 2021-02-01 ENCOUNTER — Other Ambulatory Visit: Payer: Self-pay | Admitting: Family Medicine

## 2021-02-01 MED ORDER — SULFAMETHOXAZOLE-TRIMETHOPRIM 800-160 MG PO TABS: 1.0000 | ORAL_TABLET | Freq: Two times a day (BID) | ORAL | 0 refills | Status: DC

## 2021-02-02 LAB — VITAMIN D 1,25 DIHYDROXY
Vitamin D 1, 25 (OH)2 Total: 31 pg/mL (ref 18–72)
Vitamin D2 1, 25 (OH)2: <8 pg/mL
Vitamin D3 1, 25 (OH)2: 31 pg/mL

## 2021-02-03 LAB — URINE CULTURE
MICRO NUMBER:: 12731771
SPECIMEN QUALITY:: ADEQUATE

## 2021-02-03 LAB — DRUG MONITORING PANEL 376104, URINE

## 2021-02-04 ENCOUNTER — Telehealth: Payer: Self-pay | Admitting: *Deleted

## 2021-02-04 ENCOUNTER — Encounter: Payer: Self-pay | Admitting: Cardiology

## 2021-02-04 ENCOUNTER — Other Ambulatory Visit: Payer: Self-pay

## 2021-02-04 ENCOUNTER — Ambulatory Visit (INDEPENDENT_AMBULATORY_CARE_PROVIDER_SITE_OTHER): Payer: Medicare Other | Admitting: Cardiology

## 2021-02-04 ENCOUNTER — Other Ambulatory Visit: Payer: Self-pay | Admitting: Family Medicine

## 2021-02-04 VITALS — BP 138/84 | HR 70 | Ht 62.0 in | Wt 185.1 lb

## 2021-02-04 DIAGNOSIS — I5032 Chronic diastolic (congestive) heart failure: Secondary | ICD-10-CM | POA: Diagnosis not present

## 2021-02-04 DIAGNOSIS — E7800 Pure hypercholesterolemia, unspecified: Secondary | ICD-10-CM | POA: Diagnosis not present

## 2021-02-04 DIAGNOSIS — I509 Heart failure, unspecified: Secondary | ICD-10-CM

## 2021-02-04 DIAGNOSIS — I480 Paroxysmal atrial fibrillation: Secondary | ICD-10-CM | POA: Diagnosis not present

## 2021-02-04 DIAGNOSIS — I10 Essential (primary) hypertension: Secondary | ICD-10-CM

## 2021-02-04 DIAGNOSIS — Z79899 Other long term (current) drug therapy: Secondary | ICD-10-CM

## 2021-02-04 NOTE — Telephone Encounter (Addendum)
Received notification from [facility] that quantity was insufficient to complete UDS testing.  Contacted pt and asked if she could return to provider a sample for UDS testing only and she states she can return tomorrow at 10.  Pt scheduled for lab appt at 10am. Future order placed.

## 2021-02-04 NOTE — Patient Instructions (Signed)

## 2021-02-05 ENCOUNTER — Other Ambulatory Visit (INDEPENDENT_AMBULATORY_CARE_PROVIDER_SITE_OTHER): Payer: Medicare Other

## 2021-02-05 DIAGNOSIS — Z79899 Other long term (current) drug therapy: Secondary | ICD-10-CM | POA: Diagnosis not present

## 2021-02-07 ENCOUNTER — Other Ambulatory Visit: Payer: Self-pay | Admitting: Family Medicine

## 2021-02-07 LAB — DRUG MONITORING PANEL 376104, URINE
Amphetamines: NEGATIVE ng/mL (ref <500)
Barbiturates: NEGATIVE ng/mL (ref <300)
Benzodiazepines: NEGATIVE ng/mL (ref <100)
Cocaine Metabolite: NEGATIVE ng/mL (ref <150)
Desmethyltramadol: NEGATIVE ng/mL (ref <100)
Opiates: NEGATIVE ng/mL (ref <100)
Oxycodone: NEGATIVE ng/mL (ref <100)
Tramadol: NEGATIVE ng/mL (ref <100)

## 2021-02-07 LAB — DM TEMPLATE

## 2021-02-09 ENCOUNTER — Other Ambulatory Visit: Payer: Self-pay | Admitting: Family Medicine

## 2021-02-09 DIAGNOSIS — I509 Heart failure, unspecified: Secondary | ICD-10-CM

## 2021-02-11 ENCOUNTER — Telehealth: Payer: Self-pay | Admitting: Family Medicine

## 2021-02-11 NOTE — Telephone Encounter (Signed)
[facility] pharmacy called and stated that manufactures have been switched for medication below and they need approval from Dr. To fill medication.  levothyroxine (SYNTHROID) 75 MCG tablet   [facility] [address] [place], [place] - [address]  [address], [place] [address]  Phone:  [phone]  Fax:  [phone]

## 2021-02-11 NOTE — Telephone Encounter (Signed)
Spoke with pharmacy let them know she does not have to do brand only

## 2021-03-03 ENCOUNTER — Other Ambulatory Visit: Payer: Self-pay

## 2021-03-03 ENCOUNTER — Telehealth: Payer: Self-pay | Admitting: Family Medicine

## 2021-03-03 DIAGNOSIS — R300 Dysuria: Secondary | ICD-10-CM

## 2021-03-03 NOTE — Telephone Encounter (Signed)
Lvm to let pt know it is ok to drop in and give [place] a sample

## 2021-03-03 NOTE — Telephone Encounter (Signed)
Patient states she thinks she has an UTI again and would like to come in to leave a urine sample. Please advice.

## 2021-03-04 ENCOUNTER — Other Ambulatory Visit (INDEPENDENT_AMBULATORY_CARE_PROVIDER_SITE_OTHER): Payer: Medicare Other

## 2021-03-04 DIAGNOSIS — R300 Dysuria: Secondary | ICD-10-CM

## 2021-03-04 LAB — URINALYSIS, ROUTINE W REFLEX MICROSCOPIC
Bilirubin Urine: NEGATIVE
Ketones, ur: NEGATIVE
Nitrite: NEGATIVE
Specific Gravity, Urine: 1.01 (ref 1.000–1.030)
Total Protein, Urine: NEGATIVE
Urine Glucose: NEGATIVE
Urobilinogen, UA: 0.2 (ref 0.0–1.0)
pH: 6 (ref 5.0–8.0)

## 2021-03-05 ENCOUNTER — Other Ambulatory Visit: Payer: Self-pay | Admitting: Family Medicine

## 2021-03-05 ENCOUNTER — Telehealth: Payer: Self-pay | Admitting: Family Medicine

## 2021-03-05 LAB — URINE CULTURE
MICRO NUMBER:: 12856786
SPECIMEN QUALITY:: ADEQUATE

## 2021-03-05 MED ORDER — NITROFURANTOIN MONOHYD MACRO 100 MG PO CAPS: 100.0000 mg | ORAL_CAPSULE | Freq: Two times a day (BID) | ORAL | 0 refills | Status: DC

## 2021-03-05 NOTE — Telephone Encounter (Signed)
Pt is calling to go over labs. Stated she may not have been on original antibiotic to treat this, long enough and maybe a refill might help. Please advise.

## 2021-03-05 NOTE — Telephone Encounter (Signed)
Addressed with result note

## 2021-03-25 ENCOUNTER — Encounter: Payer: Self-pay | Admitting: Family Medicine

## 2021-03-25 ENCOUNTER — Ambulatory Visit (INDEPENDENT_AMBULATORY_CARE_PROVIDER_SITE_OTHER): Payer: Medicare Other | Admitting: Family Medicine

## 2021-03-25 VITALS — BP 137/57 | HR 65 | Ht 62.0 in | Wt 184.8 lb

## 2021-03-25 DIAGNOSIS — J069 Acute upper respiratory infection, unspecified: Secondary | ICD-10-CM | POA: Diagnosis not present

## 2021-03-25 DIAGNOSIS — H6121 Impacted cerumen, right ear: Secondary | ICD-10-CM

## 2021-03-25 NOTE — Progress Notes (Signed)
Acute Office Visit  Subjective:    Patient ID: Alyssa Williamson, female    DOB: [birth year], 88 y.o.   MRN: [id number]  Chief Complaint  Patient presents with   URI    HPI Patient is in today for right ear clogged and URI symptoms.   Patient states she has been a having a few days of right ear muffled hearing and clogged sensation. States this happens at least once a year. Since noticing this she has also had some frontal/maxillary sinus pressure, mild cough and sore throat. States her granddaughter has strep and she would like to be tested. She denies any worsening dyspnea/wheezing from baseline, chest pain, nausea, vomiting, diarrhea, fever, purulent/bloody sputum.      Past Medical History:  Diagnosis Date   Allergic rhinitis    Anemia    Anxiety    Arthritis    bilateral knee   Atrial fibrillation ([place]) 12/21/2016   Cervical cancer screening 10/30/2013   Menarche at 15 Regular and moderate flow No history of abnormal pap in past G4P3, s/p 3 svd and 1 MC No history of abnormal MGM Noconcerns today No gyn surgeries   Constipation 11/18/2015   Depression with anxiety    Diverticulitis    Heart murmur    Hyperlipidemia    Hypertension    Hypothyroid    Left knee DJD    Medicare annual wellness visit, subsequent 10/30/2013   PONV (postoperative nausea and vomiting)    Preventative health care 05/20/2016    Past Surgical History:  Procedure Laterality Date   ABDOMINAL HYSTERECTOMY     bladder tack  04/2010   Dr Dinah Beers   ERCP N/A 07/16/2020   Procedure: ENDOSCOPIC RETROGRADE CHOLANGIOPANCREATOGRAPHY (ERCP);  Surgeon: Ronnette Juniper, MD;  Location: [facility];  Service: Gastroenterology;  Laterality: N/A;   GANGLION CYST EXCISION     JOINT REPLACEMENT Right    REMOVAL OF STONES  07/16/2020   Procedure: REMOVAL OF STONES;  Surgeon: Ronnette Juniper, MD;  Location: [facility];  Service: Gastroenterology;;   REPLACEMENT TOTAL KNEE  10/2010   right knee-[place]     SPHINCTEROTOMY  07/16/2020   Procedure: SPHINCTEROTOMY;  Surgeon: Ronnette Juniper, MD;  Location: [facility];  Service: Gastroenterology;;   TONSILLECTOMY     TOTAL KNEE ARTHROPLASTY Left 07/26/2012   Procedure: TOTAL KNEE ARTHROPLASTY;  Surgeon: Ninetta Lights, MD;  Location: [facility];  Service: Orthopedics;  Laterality: Left;    Family History  Problem Relation Age of Onset   Alcohol abuse Father    Breast cancer Sister    Cancer Sister        bladder   Glaucoma Sister    Arthritis Daughter    Diabetes Daughter        type 2   Cancer Daughter        brain cancer, diagnosed 47 years.agp   Mental illness Daughter        s/p craniotomies, gamma knife for tumors. numerous shunts.   Heart failure Mother    Cancer Brother        glioblastoma    Glaucoma Brother    Diverticulosis Sister    Cancer Sister        liver   Cancer Brother        melanoma, mets to lung and brain and intestine   Alcohol abuse Brother    Cancer Brother        liver   Glaucoma Maternal Grandfather    Cancer  Sister        breast   Colon cancer Neg Hx    Hypertension Neg Hx     Social History   Socioeconomic History   Marital status: Widowed    Spouse name: Not on file   Number of children: 3   Years of education: Not on file   Highest education level: Not on file  Occupational History   Not on file  Tobacco Use   Smoking status: Never   Smokeless tobacco: Never  Vaping Use   Vaping Use: Never used  Substance and Sexual Activity   Alcohol use: No   Drug use: No   Sexual activity: Not on file  Other Topics Concern   Not on file  Social History Narrative   Not on file   Social Determinants of Health   Financial Resource Strain: Not on file  Food Insecurity: Not on file  Transportation Needs: Not on file  Physical Activity: Not on file  Stress: Not on file  Social Connections: Not on file  Intimate Partner Violence: Not on file    Outpatient Medications Prior to Visit  Medication Sig  Dispense Refill   acetaminophen (TYLENOL) 325 MG tablet Take 2 tablets (650 mg total) by mouth every 6 (six) hours as needed for mild pain (or Fever >/= 101).     albuterol (VENTOLIN HFA) 108 (90 Base) MCG/ACT inhaler Inhale 1 puff into the lungs every 6 (six) hours as needed for wheezing or shortness of breath.     ALPRAZolam (XANAX) 0.25 MG tablet Take 1 tablet (0.25 mg total) by mouth 2 (two) times daily as needed for anxiety. 45 tablet 1   apixaban (ELIQUIS) 5 MG TABS tablet Take 1 tablet by mouth twice daily 180 tablet 1   atorvastatin (LIPITOR) 40 MG tablet Take 1 tablet (40 mg total) by mouth daily. 90 tablet 3   citalopram (CELEXA) 20 MG tablet Take 1 tablet by mouth once daily 90 tablet 1   Cranberry 500 MG CAPS Take 500 mg by mouth daily.     furosemide (LASIX) 20 MG tablet Take 1 tablet by mouth once daily 90 tablet 0   levothyroxine (SYNTHROID) 75 MCG tablet TAKE 1 TABLET BY MOUTH ONCE DAILY BEFORE BREAKFAST 90 tablet 0   loratadine (CLARITIN) 10 MG tablet Take 10 mg by mouth daily.     Multiple Vitamins-Minerals (CENTRUM SILVER PO) Take 1 tablet by mouth daily.     valACYclovir (VALTREX) 1000 MG tablet Take 1 tablet (1,000 mg total) by mouth 3 (three) times daily. 21 tablet 0   verapamil (CALAN-SR) 240 MG CR tablet Take 1 tablet by mouth once daily 90 tablet 0   budesonide-formoterol (SYMBICORT) 160-4.5 MCG/ACT inhaler Inhale 2 puffs into the lungs 2 (two) times daily. (Patient taking differently: Inhale 2 puffs into the lungs 2 (two) times daily as needed (shortness of breath or wheezing).) 1 each 3   nitrofurantoin, macrocrystal-monohydrate, (MACROBID) 100 MG capsule Take 1 capsule (100 mg total) by mouth 2 (two) times daily. 10 capsule 0   sulfamethoxazole-trimethoprim (BACTRIM DS) 800-160 MG tablet Take 1 tablet by mouth 2 (two) times daily. 10 tablet 0   No facility-administered medications prior to visit.    Allergies  Allergen Reactions   Cefdinir Dermatitis   Codeine  Nausea And Vomiting    Review of Systems All review of systems negative except what is listed in the HPI     Objective:    Physical Exam Vitals reviewed.  Constitutional:      Appearance: Normal appearance.  HENT:     Head: Normocephalic and atraumatic.     Right Ear: There is impacted cerumen.     Left Ear: Tympanic membrane normal.     Mouth/Throat:     Pharynx: No oropharyngeal exudate or posterior oropharyngeal erythema.  Eyes:     Extraocular Movements: Extraocular movements intact.  Cardiovascular:     Rate and Rhythm: Normal rate and regular rhythm.  Pulmonary:     Effort: Pulmonary effort is normal.     Breath sounds: Normal breath sounds.  Musculoskeletal:     Cervical back: Normal range of motion and neck supple. No tenderness.  Lymphadenopathy:     Cervical: No cervical adenopathy.  Skin:    General: Skin is warm and dry.  Neurological:     General: No focal deficit present.     Mental Status: She is alert and oriented to person, place, and time. Mental status is at baseline.  Psychiatric:        Mood and Affect: Mood normal.        Behavior: Behavior normal.        Thought Content: Thought content normal.        Judgment: Judgment normal.    BP (!) 137/57    Pulse 65    Ht 5' 2"  (1.575 m)    Wt 184 lb 12.8 oz (83.8 kg)    SpO2 94%    BMI 33.80 kg/m  Wt Readings from Last 3 Encounters:  03/25/21 184 lb 12.8 oz (83.8 kg)  02/04/21 185 lb 1.9 oz (84 kg)  01/29/21 184 lb 3.2 oz (83.6 kg)    Health Maintenance Due  Topic Date Due   Zoster Vaccines- Shingrix (1 of 2) Never done   COVID-19 Vaccine (5 - Booster for Pfizer series) 08/21/2020    There are no preventive care reminders to display for this patient.   Lab Results  Component Value Date   TSH 0.85 01/29/2021   Lab Results  Component Value Date   WBC 5.6 01/29/2021   HGB 13.1 01/29/2021   HCT 39.6 01/29/2021   MCV 98.9 01/29/2021   PLT 205.0 01/29/2021   Lab Results  Component Value  Date   NA 137 01/29/2021   K 4.5 01/29/2021   CO2 31 01/29/2021   GLUCOSE 89 01/29/2021   BUN 19 01/29/2021   CREATININE 1.13 01/29/2021   BILITOT 1.0 01/29/2021   ALKPHOS 71 01/29/2021   AST 21 01/29/2021   ALT 16 01/29/2021   PROT 6.5 01/29/2021   ALBUMIN 3.7 01/29/2021   CALCIUM 9.3 01/29/2021   ANIONGAP 4 (L) 07/20/2020   EGFR 53 (L) 08/06/2020   GFR 43.64 (L) 01/29/2021   Lab Results  Component Value Date   CHOL 137 01/29/2021   Lab Results  Component Value Date   HDL 65.50 01/29/2021   Lab Results  Component Value Date   LDLCALC 58 01/29/2021   Lab Results  Component Value Date   TRIG 69.0 01/29/2021   Lab Results  Component Value Date   CHOLHDL 2 01/29/2021   Lab Results  Component Value Date   HGBA1C 5.9 06/26/2020       Assessment & Plan:   1. Viral URI with cough Day 2 of symptoms. Recommend supportive measures. No strep tests available and with cough strep is unlikely. Continue supportive measures including rest, hydration, humidifier use, steam showers, warm compresses to sinuses, warm liquids with lemon and  honey, and over-the-counter cough, cold, and analgesics as needed.  Patient aware of signs/symptoms requiring further/urgent evaluation.   2. Impacted cerumen of right ear Indication: Cerumen impaction of the ear(s)  Medical necessity statement: On physical examination, cerumen impairs clinically significant portions of the external auditory canal, and tympanic membrane. Noted obstructive, copious cerumen that cannot be removed without magnification and instrumentations requiring  skills Consent: Discussed benefits and risks of procedure and verbal consent obtained Procedure: Patient was prepped for the procedure. Utilized an otoscope to assess and take note of the ear canal, the tympanic membrane, and the presence, amount, and placement of the cerumen. Gentle water irrigation and soft curette  was utilized to remove cerumen.  Post procedure  examination: shows cerumen was completely removed. Patient tolerated procedure well. The patient is made aware that they may experience temporary vertigo, temporary hearing loss, and temporary discomfort. If these symptom last for more than 24 hours to call the clinic or proceed to the ED.    Follow-up if symptoms worsen or fail to improve.    Terrilyn Saver, NP

## 2021-03-25 NOTE — Patient Instructions (Signed)
Continue supportive measures including rest, hydration, humidifier use, steam showers, warm compresses to sinuses, warm liquids with lemon and honey, and over-the-counter cough, cold, and analgesics as needed.   Over the counter medications that may be helpful for symptoms:  Guaifenesin 1200 mg extended release tabs twice daily, with plenty of water For cough and congestion Brand name: Mucinex   Pseudoephedrine 30 mg, one or two tabs every 4 to 6 hours For sinus congestion Brand name: Sudafed You must get this from the pharmacy counter.  Oxymetazoline nasal spray each morning, one spray in each nostril, for NO MORE THAN 3 days  For nasal and sinus congestion Brand name: Afrin Saline nasal spray or Saline Nasal Irrigation 3-5 times a day For nasal and sinus congestion Brand names: Ocean or AYR Fluticasone nasal spray, one spray in each nostril, each morning (after oxymetazoline and saline, if used) For nasal and sinus congestion Brand name: Flonase Warm salt water gargles  For sore throat Every few hours as needed Alternate ibuprofen 400-600 mg and acetaminophen 1000 mg every 4-6 hours For fever, body aches, headache Brand names: Motrin or Advil and Tylenol Dextromethorphan 12-hour cough version 30 mg every 12 hours  For cough Brand name: Delsym Stop all other cold medications for now (Nyquil, Dayquil, Tylenol Cold, Theraflu, etc) and other non-prescription cough/cold preparations. Many of these have the same ingredients listed above and could cause an overdose of medication.   Herbal treatments that have been shown to be helpful in some patients include: Vitamin C 1000mg per day Vitamin D 4000iU per day Zinc 100mg per day Quercetin 25-500mg twice a day Melatonin 5-10mg at bedtime  General Instructions Allow your body to rest Drink PLENTY of fluids Isolate yourself from everyone, even family, until test results have returned    If you develop severe shortness of breath,  uncontrolled fevers, coughing up blood, confusion, chest pain, or signs of dehydration (such as significantly decreased urine amounts or dizziness with standing) please go to the ER.  

## 2021-04-25 ENCOUNTER — Other Ambulatory Visit: Payer: Self-pay | Admitting: Family Medicine

## 2021-05-04 ENCOUNTER — Other Ambulatory Visit: Payer: Self-pay | Admitting: Family Medicine

## 2021-05-04 DIAGNOSIS — I509 Heart failure, unspecified: Secondary | ICD-10-CM

## 2021-05-06 ENCOUNTER — Other Ambulatory Visit: Payer: Self-pay | Admitting: Family Medicine

## 2021-05-06 NOTE — Telephone Encounter (Signed)
Requesting: alprazolam 0.25mg   ?Contract: 06/26/2020 ?UDS: 01/29/2021 ?Last Visit: 03/25/2021 w/ Ladona Ridgel ?Next Visit: 08/04/2021 ?Last Refill: 01/29/2021 #45 and 1RF  ? ?Please Advise ? ?

## 2021-06-05 ENCOUNTER — Other Ambulatory Visit: Payer: Self-pay | Admitting: Family Medicine

## 2021-06-11 ENCOUNTER — Other Ambulatory Visit: Payer: Self-pay | Admitting: Family Medicine

## 2021-06-19 ENCOUNTER — Telehealth: Payer: Self-pay | Admitting: Cardiology

## 2021-06-19 MED ORDER — APIXABAN 5 MG PO TABS: 5.0000 mg | ORAL_TABLET | Freq: Two times a day (BID) | ORAL | 5 refills | Status: DC

## 2021-06-19 NOTE — Telephone Encounter (Signed)
?*  STAT* If patient is at the pharmacy, call can be transferred to refill team. ? ? ?1. Which medications need to be refilled? (please list name of each medication and dose if known)  ? apixaban (ELIQUIS) 5 MG TABS tablet  ? ? ?2. Which pharmacy/location (including street and city if local pharmacy) is medication to be sent to?  ?[facility] [address] [place], [place] - [address] ?3. Do they need a 30 day or 90 day supply?  ?30 day ? ?

## 2021-06-19 NOTE — Telephone Encounter (Signed)
Prescription refill request for Eliquis received. ?Indication:Afib ?Last office visit:12/22 ?Scr:1.1 ?Age: 88 ?Weight:83.8 kg ? ?Prescription refilled ? ?

## 2021-07-24 ENCOUNTER — Telehealth: Payer: Self-pay | Admitting: Family Medicine

## 2021-07-24 MED ORDER — ALPRAZOLAM 0.25 MG PO TABS: 0.2500 mg | ORAL_TABLET | Freq: Every day | ORAL | 1 refills | Status: DC | PRN

## 2021-07-24 NOTE — Telephone Encounter (Signed)
Medication:   ALPRAZolam (XANAX) 0.25 MG tablet [id number]   Has the patient contacted their pharmacy? No. (If no, request that the patient contact the pharmacy for the refill.) (If yes, when and what did the pharmacy advise?)  Preferred Pharmacy (with phone number or street name):   [facility] [address] [place], [place] - [address]  [address] [place] [address]  Phone:  [phone]  Fax:  [phone]  Agent: Please be advised that RX refills may take up to 3 business days. We ask that you follow-up with your pharmacy.

## 2021-07-24 NOTE — Telephone Encounter (Signed)
PDMP okay, Rx sent 

## 2021-07-24 NOTE — Telephone Encounter (Signed)
Requesting: Xanax 0.25MG  Contract: 06/26/20 UDS: 01/29/21 Last Visit: 03/25/21 Next Visit: 08/04/21 Last Refill: 06/12/21  Please Advise

## 2021-07-27 ENCOUNTER — Other Ambulatory Visit: Payer: Self-pay | Admitting: Family Medicine

## 2021-08-03 NOTE — Progress Notes (Signed)
Subjective:    Patient ID: Alyssa Williamson, female    DOB: 21-Mar-1931, 88 y.o.   MRN: [id number]  Chief Complaint  Patient presents with   Follow-up    HPI Patient is in today for a follow up on chronic medical concerns. No recent febrile illness or hospitalizations. Overall she is doing well. Her daughter [name] is managing her health concerns better as well. Denies CP/palp/SOB/HA/congestion/fevers/GI or GU c/o. Taking meds as prescribed. She notes her neck an shoulders hurt and feel stiff regularly as does her low back. No new falls, injury or incontinence.   Past Medical History:  Diagnosis Date   Allergic rhinitis    Anemia    Anxiety    Arthritis    bilateral knee   Atrial fibrillation ([place]) 12/21/2016   Cervical cancer screening 10/30/2013   Menarche at 15 Regular and moderate flow No history of abnormal pap in past G4P3, s/p 3 svd and 1 MC No history of abnormal MGM Noconcerns today No gyn surgeries   Constipation 11/18/2015   Depression with anxiety    Diverticulitis    Heart murmur    Hyperlipidemia    Hypertension    Hypothyroid    Left knee DJD    Medicare annual wellness visit, subsequent 10/30/2013   PONV (postoperative nausea and vomiting)    Preventative health care 05/20/2016    Past Surgical History:  Procedure Laterality Date   ABDOMINAL HYSTERECTOMY     bladder tack  04/2010   Dr Dinah Beers   ERCP N/A 07/16/2020   Procedure: ENDOSCOPIC RETROGRADE CHOLANGIOPANCREATOGRAPHY (ERCP);  Surgeon: Ronnette Juniper, MD;  Location: [facility];  Service: Gastroenterology;  Laterality: N/A;   GANGLION CYST EXCISION     JOINT REPLACEMENT Right    REMOVAL OF STONES  07/16/2020   Procedure: REMOVAL OF STONES;  Surgeon: Ronnette Juniper, MD;  Location: [facility];  Service: Gastroenterology;;   REPLACEMENT TOTAL KNEE  10/2010   right knee-[place]    SPHINCTEROTOMY  07/16/2020   Procedure: SPHINCTEROTOMY;  Surgeon: Ronnette Juniper, MD;  Location: [facility];  Service:  Gastroenterology;;   TONSILLECTOMY     TOTAL KNEE ARTHROPLASTY Left 07/26/2012   Procedure: TOTAL KNEE ARTHROPLASTY;  Surgeon: Ninetta Lights, MD;  Location: [facility];  Service: Orthopedics;  Laterality: Left;    Family History  Problem Relation Age of Onset   Alcohol abuse Father    Breast cancer Sister    Cancer Sister        bladder   Glaucoma Sister    Arthritis Daughter    Diabetes Daughter        type 2   Cancer Daughter        brain cancer, diagnosed 8 years.agp   Mental illness Daughter        s/p craniotomies, gamma knife for tumors. numerous shunts.   Heart failure Mother    Cancer Brother        glioblastoma    Glaucoma Brother    Diverticulosis Sister    Cancer Sister        liver   Cancer Brother        melanoma, mets to lung and brain and intestine   Alcohol abuse Brother    Cancer Brother        liver   Glaucoma Maternal Grandfather    Cancer Sister        breast   Colon cancer Neg Hx    Hypertension Neg Hx  Social History   Socioeconomic History   Marital status: Widowed    Spouse name: Not on file   Number of children: 3   Years of education: Not on file   Highest education level: Not on file  Occupational History   Not on file  Tobacco Use   Smoking status: Never   Smokeless tobacco: Never  Vaping Use   Vaping Use: Never used  Substance and Sexual Activity   Alcohol use: No   Drug use: No   Sexual activity: Not on file  Other Topics Concern   Not on file  Social History Narrative   Not on file   Social Determinants of Health   Financial Resource Strain: Not on file  Food Insecurity: Not on file  Transportation Needs: Not on file  Physical Activity: Not on file  Stress: Not on file  Social Connections: Not on file  Intimate Partner Violence: Not on file    Outpatient Medications Prior to Visit  Medication Sig Dispense Refill   acetaminophen (TYLENOL) 325 MG tablet Take 2 tablets (650 mg total) by mouth every 6 (six) hours as  needed for mild pain (or Fever >/= 101).     albuterol (VENTOLIN HFA) 108 (90 Base) MCG/ACT inhaler Inhale 1 puff into the lungs every 6 (six) hours as needed for wheezing or shortness of breath.     ALPRAZolam (XANAX) 0.25 MG tablet Take 1 tablet (0.25 mg total) by mouth daily as needed for anxiety. 30 tablet 1   apixaban (ELIQUIS) 5 MG TABS tablet Take 1 tablet (5 mg total) by mouth 2 (two) times daily. 60 tablet 5   citalopram (CELEXA) 20 MG tablet Take 1 tablet by mouth once daily 90 tablet 0   Cranberry 500 MG CAPS Take 500 mg by mouth daily.     levothyroxine (SYNTHROID) 75 MCG tablet TAKE 1 TABLET BY MOUTH ONCE DAILY BEFORE BREAKFAST 90 tablet 0   loratadine (CLARITIN) 10 MG tablet Take 10 mg by mouth daily.     Multiple Vitamins-Minerals (CENTRUM SILVER PO) Take 1 tablet by mouth daily.     valACYclovir (VALTREX) 1000 MG tablet Take 1 tablet (1,000 mg total) by mouth 3 (three) times daily. 21 tablet 0   verapamil (CALAN-SR) 240 MG CR tablet Take 1 tablet by mouth once daily 90 tablet 0   furosemide (LASIX) 20 MG tablet Take 1 tablet by mouth once daily 90 tablet 0   atorvastatin (LIPITOR) 40 MG tablet Take 1 tablet (40 mg total) by mouth daily. 90 tablet 3   No facility-administered medications prior to visit.    Allergies  Allergen Reactions   Cefdinir Dermatitis   Codeine Nausea And Vomiting    Review of Systems  Constitutional:  Negative for fever and malaise/fatigue.  HENT:  Negative for congestion.   Eyes:  Negative for blurred vision.  Respiratory:  Negative for shortness of breath.   Cardiovascular:  Negative for chest pain, palpitations and leg swelling.  Gastrointestinal:  Negative for abdominal pain, blood in stool and nausea.  Genitourinary:  Negative for dysuria and frequency.  Musculoskeletal:  Positive for back pain, myalgias and neck pain. Negative for falls.  Skin:  Negative for rash.  Neurological:  Negative for dizziness, loss of consciousness and headaches.   Endo/Heme/Allergies:  Negative for environmental allergies.  Psychiatric/Behavioral:  Negative for depression. The patient is not nervous/anxious.        Objective:    Physical Exam Constitutional:      General:  She is not in acute distress.    Appearance: She is well-developed.  HENT:     Head: Normocephalic and atraumatic.  Eyes:     Conjunctiva/sclera: Conjunctivae normal.  Neck:     Thyroid: No thyromegaly.  Cardiovascular:     Rate and Rhythm: Normal rate. Rhythm irregular.     Heart sounds: Normal heart sounds. No murmur heard. Pulmonary:     Effort: Pulmonary effort is normal. No respiratory distress.     Breath sounds: Normal breath sounds.  Abdominal:     General: Bowel sounds are normal. There is no distension.     Palpations: Abdomen is soft. There is no mass.     Tenderness: There is no abdominal tenderness.  Musculoskeletal:     Cervical back: Neck supple.  Lymphadenopathy:     Cervical: No cervical adenopathy.  Skin:    General: Skin is warm and dry.  Neurological:     Mental Status: She is alert and oriented to person, place, and time.  Psychiatric:        Behavior: Behavior normal.     BP 132/70 (BP Location: Left Arm, Patient Position: Sitting, Cuff Size: Normal)   Resp 20   Ht _0  (1.575 m)   Wt 185 lb (83.9 kg)   BMI 33.84 kg/m  Wt Readings from Last 3 Encounters:  08/04/21 185 lb (83.9 kg)  03/25/21 184 lb 12.8 oz (83.8 kg)  02/04/21 185 lb 1.9 oz (84 kg)    Diabetic Foot Exam - Simple   No data filed    Lab Results  Component Value Date   WBC 5.6 01/29/2021   HGB 13.1 01/29/2021   HCT 39.6 01/29/2021   PLT 205.0 01/29/2021   GLUCOSE 89 01/29/2021   CHOL 137 01/29/2021   TRIG 69.0 01/29/2021   HDL 65.50 01/29/2021   LDLCALC 58 01/29/2021   ALT 16 01/29/2021   AST 21 01/29/2021   NA 137 01/29/2021   K 4.5 01/29/2021   CL 100 01/29/2021   CREATININE 1.13 01/29/2021   BUN 19 01/29/2021   CO2 31 01/29/2021   TSH 0.85  01/29/2021   INR 0.99 07/24/2012   HGBA1C 5.9 06/26/2020    Lab Results  Component Value Date   TSH 0.85 01/29/2021   Lab Results  Component Value Date   WBC 5.6 01/29/2021   HGB 13.1 01/29/2021   HCT 39.6 01/29/2021   MCV 98.9 01/29/2021   PLT 205.0 01/29/2021   Lab Results  Component Value Date   NA 137 01/29/2021   K 4.5 01/29/2021   CO2 31 01/29/2021   GLUCOSE 89 01/29/2021   BUN 19 01/29/2021   CREATININE 1.13 01/29/2021   BILITOT 1.0 01/29/2021   ALKPHOS 71 01/29/2021   AST 21 01/29/2021   ALT 16 01/29/2021   PROT 6.5 01/29/2021   ALBUMIN 3.7 01/29/2021   CALCIUM 9.3 01/29/2021   ANIONGAP 4 (L) 07/20/2020   EGFR 53 (L) 08/06/2020   GFR 43.64 (L) 01/29/2021   Lab Results  Component Value Date   CHOL 137 01/29/2021   Lab Results  Component Value Date   HDL 65.50 01/29/2021   Lab Results  Component Value Date   LDLCALC 58 01/29/2021   Lab Results  Component Value Date   TRIG 69.0 01/29/2021   Lab Results  Component Value Date   CHOLHDL 2 01/29/2021   Lab Results  Component Value Date   HGBA1C 5.9 06/26/2020       Assessment &  Plan:   COLONOSCOPY: PAP: PSA: DEXA:   Problem List Items Addressed This Visit     Hypothyroidism    On Levothyroxine, continue to monitor      Relevant Orders   TSH   HTN (hypertension) - Primary    Well controlled, no changes to meds. Encouraged heart healthy diet such as the DASH diet and exercise as tolerated.       Relevant Medications   furosemide (LASIX) 20 MG tablet   Other Relevant Orders   Comprehensive metabolic panel   CBC   Hyperlipidemia    Encourage heart healthy diet such as MIND or DASH diet, increase exercise, avoid trans fats, simple carbohydrates and processed foods, consider a krill or fish or flaxseed oil cap daily.       Relevant Medications   furosemide (LASIX) 20 MG tablet   Other Relevant Orders   Lipid panel   Hyperglycemia    hgba1c acceptable, minimize simple carbs.  Increase exercise as tolerated.       Hypocalcemia   Relevant Orders   VITAMIN D 25 Hydroxy (Vit-D Deficiency, Fractures)   Chronic midline low back pain    Encouraged moist heat and gentle stretching as tolerated. May try NSAIDs and prescription meds as directed and report if symptoms worsen or seek immediate care, check Xray consider PT      Neck pain    Encouraged moist heat and gentle stretching as tolerated. May try NSAIDs and prescription meds as directed and report if symptoms worsen or seek immediate care. She notes stiffness in neck and shoulders and surrounding muscles. Encouraged topical treatments as well check xray and consider referral if worsens.      Relevant Orders   DG Cervical Spine Complete   Chronic renal insufficiency    Mild, Hydrate and monitor      Other Visit Diagnoses     Congestive heart failure, unspecified HF chronicity, unspecified heart failure type (HCC)       Relevant Medications   furosemide (LASIX) 20 MG tablet   Low back pain, unspecified back pain laterality, unspecified chronicity, unspecified whether sciatica present       Relevant Orders   DG Lumbar Spine 2-3 Views       I have changed Shirlee Limerick E. Strothers's furosemide. I am also having her maintain her Multiple Vitamins-Minerals (CENTRUM SILVER PO), Cranberry, loratadine, acetaminophen, albuterol, atorvastatin, valACYclovir, levothyroxine, verapamil, apixaban, ALPRAZolam, and citalopram.  Meds ordered this encounter  Medications   furosemide (LASIX) 20 MG tablet    Sig: Take 1 tablet (20 mg total) by mouth daily.    Dispense:  90 tablet    Refill:  1

## 2021-08-04 ENCOUNTER — Ambulatory Visit (HOSPITAL_BASED_OUTPATIENT_CLINIC_OR_DEPARTMENT_OTHER)
Admission: RE | Admit: 2021-08-04 | Discharge: 2021-08-04 | Disposition: A | Discharge status: Home / Self Care | Payer: Medicare Other | Source: Ambulatory Visit | Attending: Family Medicine | Admitting: Family Medicine

## 2021-08-04 ENCOUNTER — Encounter: Payer: Self-pay | Admitting: Family Medicine

## 2021-08-04 ENCOUNTER — Ambulatory Visit (INDEPENDENT_AMBULATORY_CARE_PROVIDER_SITE_OTHER): Payer: Medicare Other | Admitting: Family Medicine

## 2021-08-04 VITALS — BP 132/70 | Resp 20 | Ht 62.0 in | Wt 185.0 lb

## 2021-08-04 DIAGNOSIS — M542 Cervicalgia: Secondary | ICD-10-CM | POA: Diagnosis present

## 2021-08-04 DIAGNOSIS — I10 Essential (primary) hypertension: Secondary | ICD-10-CM

## 2021-08-04 DIAGNOSIS — N189 Chronic kidney disease, unspecified: Secondary | ICD-10-CM

## 2021-08-04 DIAGNOSIS — R739 Hyperglycemia, unspecified: Secondary | ICD-10-CM

## 2021-08-04 DIAGNOSIS — M545 Low back pain, unspecified: Secondary | ICD-10-CM | POA: Diagnosis present

## 2021-08-04 DIAGNOSIS — E039 Hypothyroidism, unspecified: Secondary | ICD-10-CM

## 2021-08-04 DIAGNOSIS — G8929 Other chronic pain: Secondary | ICD-10-CM

## 2021-08-04 DIAGNOSIS — E7800 Pure hypercholesterolemia, unspecified: Secondary | ICD-10-CM | POA: Diagnosis not present

## 2021-08-04 DIAGNOSIS — I509 Heart failure, unspecified: Secondary | ICD-10-CM

## 2021-08-04 LAB — LIPID PANEL
Cholesterol: 140 mg/dL (ref 0–200)
HDL: 66.6 mg/dL (ref >39.00)
LDL Cholesterol: 62 mg/dL (ref 0–99)
NonHDL: 73.3
Total CHOL/HDL Ratio: 2
Triglycerides: 57 mg/dL (ref 0.0–149.0)
VLDL: 11.4 mg/dL (ref 0.0–40.0)

## 2021-08-04 LAB — COMPREHENSIVE METABOLIC PANEL
ALT: 20 U/L (ref 0–35)
AST: 23 U/L (ref 0–37)
Albumin: 3.8 g/dL (ref 3.5–5.2)
Alkaline Phosphatase: 90 U/L (ref 39–117)
BUN: 16 mg/dL (ref 6–23)
CO2: 32 mEq/L (ref 19–32)
Calcium: 9.2 mg/dL (ref 8.4–10.5)
Chloride: 99 mEq/L (ref 96–112)
Creatinine, Ser: 1.05 mg/dL (ref 0.40–1.20)
GFR: 47.49 mL/min — ABNORMAL LOW (ref >60.00)
Glucose, Bld: 85 mg/dL (ref 70–99)
Potassium: 4.6 mEq/L (ref 3.5–5.1)
Sodium: 137 mEq/L (ref 135–145)
Total Bilirubin: 1.4 mg/dL — ABNORMAL HIGH (ref 0.2–1.2)
Total Protein: 6.6 g/dL (ref 6.0–8.3)

## 2021-08-04 LAB — CBC
HCT: 40.6 % (ref 36.0–46.0)
Hemoglobin: 13.7 g/dL (ref 12.0–15.0)
MCHC: 33.7 g/dL (ref 30.0–36.0)
MCV: 96.5 fl (ref 78.0–100.0)
Platelets: 211 10*3/uL (ref 150.0–400.0)
RBC: 4.21 Mil/uL (ref 3.87–5.11)
RDW: 13.2 % (ref 11.5–15.5)
WBC: 6.9 10*3/uL (ref 4.0–10.5)

## 2021-08-04 LAB — TSH: TSH: 1.46 u[IU]/mL (ref 0.35–5.50)

## 2021-08-04 LAB — VITAMIN D 25 HYDROXY (VIT D DEFICIENCY, FRACTURES): VITD: 43.28 ng/mL (ref 30.00–100.00)

## 2021-08-04 MED ORDER — FUROSEMIDE 20 MG PO TABS: 20.0000 mg | ORAL_TABLET | Freq: Every day | ORAL | 1 refills | Status: DC

## 2021-08-04 NOTE — Patient Instructions (Signed)

## 2021-08-04 NOTE — Assessment & Plan Note (Signed)
hgba1c acceptable, minimize simple carbs. Increase exercise as tolerated.  

## 2021-08-04 NOTE — Assessment & Plan Note (Signed)
Well controlled, no changes to meds. Encouraged heart healthy diet such as the DASH diet and exercise as tolerated.  °

## 2021-08-04 NOTE — Assessment & Plan Note (Signed)
Encouraged moist heat and gentle stretching as tolerated. May try NSAIDs and prescription meds as directed and report if symptoms worsen or seek immediate care, check Xray consider PT

## 2021-08-04 NOTE — Assessment & Plan Note (Signed)
Mild, Hydrate and monitor

## 2021-08-04 NOTE — Assessment & Plan Note (Signed)
Encourage heart healthy diet such as MIND or DASH diet, increase exercise, avoid trans fats, simple carbohydrates and processed foods, consider a krill or fish or flaxseed oil cap daily.  °

## 2021-08-04 NOTE — Assessment & Plan Note (Signed)
Encouraged moist heat and gentle stretching as tolerated. May try NSAIDs and prescription meds as directed and report if symptoms worsen or seek immediate care. She notes stiffness in neck and shoulders and surrounding muscles. Encouraged topical treatments as well check xray and consider referral if worsens.

## 2021-08-04 NOTE — Assessment & Plan Note (Signed)
On Levothyroxine, continue to monitor 

## 2021-08-07 ENCOUNTER — Other Ambulatory Visit: Payer: Self-pay

## 2021-08-07 ENCOUNTER — Other Ambulatory Visit: Payer: Self-pay | Admitting: Family Medicine

## 2021-08-07 MED ORDER — LEVOTHYROXINE SODIUM 75 MCG PO TABS: 75.0000 ug | ORAL_TABLET | Freq: Every day | ORAL | 1 refills | Status: DC

## 2021-08-27 ENCOUNTER — Other Ambulatory Visit: Payer: Self-pay | Admitting: Cardiology

## 2021-08-27 DIAGNOSIS — E7800 Pure hypercholesterolemia, unspecified: Secondary | ICD-10-CM

## 2021-09-21 ENCOUNTER — Other Ambulatory Visit: Payer: Self-pay | Admitting: Family Medicine

## 2021-09-21 MED ORDER — ALPRAZOLAM 0.25 MG PO TABS
0.2500 mg | ORAL_TABLET | Freq: Every day | ORAL | 0 refills | Status: DC | PRN
Start: 2021-09-21 — End: 2021-10-27

## 2021-09-21 MED ORDER — VERAPAMIL HCL ER 240 MG PO TBCR: 240.0000 mg | EXTENDED_RELEASE_TABLET | Freq: Every day | ORAL | 1 refills | Status: DC

## 2021-09-21 NOTE — Telephone Encounter (Signed)
Requesting: alprazolam 0.25 Contract: 06/26/20 UDS: 02/05/21 Last Visit: 08/04/21 Next Visit: 02/09/22 Last Refill: 07/24/21 #30 1 refill  Please Advise

## 2021-09-21 NOTE — Telephone Encounter (Signed)
Medication: ALPRAZolam (XANAX) 0.25 MG tablet  verapamil (CALAN-SR) 240 MG CR tablet  Has the patient contacted their pharmacy? Yes.     Preferred pharmacy: [facility] [address] [place], [place] - [address]   [address] [place] [address]  Phone:  [phone]  Fax:  [phone]

## 2021-10-21 ENCOUNTER — Other Ambulatory Visit: Payer: Self-pay | Admitting: Family Medicine

## 2021-10-26 ENCOUNTER — Other Ambulatory Visit: Payer: Self-pay | Admitting: Family Medicine

## 2021-10-27 NOTE — Telephone Encounter (Signed)
Requesting: alprazolam 0.25mg   Contract: 07/08/20 UDS: 02/05/21 Last Visit: 08/04/21 Next Visit: 02/09/22 Last Refill: 09/21/21 #30 and 0RF  Please Advise

## 2021-11-24 ENCOUNTER — Other Ambulatory Visit (HOSPITAL_COMMUNITY): Payer: Self-pay

## 2021-11-30 ENCOUNTER — Other Ambulatory Visit: Payer: Self-pay | Admitting: Family Medicine

## 2021-11-30 ENCOUNTER — Telehealth: Payer: Self-pay | Admitting: Family Medicine

## 2021-11-30 MED ORDER — ALPRAZOLAM 0.25 MG PO TABS: 0.2500 mg | ORAL_TABLET | Freq: Every day | ORAL | 1 refills | Status: DC | PRN

## 2021-11-30 NOTE — Telephone Encounter (Signed)
Medication: ALPRAZolam (XANAX) 0.25 MG tablet  Has the patient contacted their pharmacy? No.   Preferred Pharmacy:  [facility] - [place], [place] - [address], [place] [address] Phone: [phone]  Fax: [phone] 

## 2021-12-07 ENCOUNTER — Telehealth: Payer: Self-pay | Admitting: Family Medicine

## 2021-12-07 NOTE — Telephone Encounter (Signed)
Pt would like to know if pcp could order a urine test as she thinks she has a uti. She declined an appt, and would just like to see the lab.

## 2021-12-08 ENCOUNTER — Other Ambulatory Visit: Payer: Self-pay

## 2021-12-08 DIAGNOSIS — R350 Frequency of micturition: Secondary | ICD-10-CM

## 2021-12-08 NOTE — Telephone Encounter (Signed)
Called pt Alyssa Williamson to get lab appt made  And orders are in

## 2021-12-21 ENCOUNTER — Telehealth: Payer: Self-pay | Admitting: Family Medicine

## 2021-12-21 NOTE — Telephone Encounter (Signed)
Called pt Lvm was return her all to give advise Regarding vaccine needed.

## 2021-12-21 NOTE — Telephone Encounter (Signed)
Pt called stating that she would like to have her immunizations looked over to see if there is anything she is due for other that her routine flu shot.

## 2021-12-22 ENCOUNTER — Other Ambulatory Visit: Payer: Self-pay | Admitting: Cardiology

## 2021-12-22 ENCOUNTER — Ambulatory Visit (INDEPENDENT_AMBULATORY_CARE_PROVIDER_SITE_OTHER): Payer: Medicare Other

## 2021-12-22 DIAGNOSIS — Z23 Encounter for immunization: Secondary | ICD-10-CM

## 2021-12-22 DIAGNOSIS — I4891 Unspecified atrial fibrillation: Secondary | ICD-10-CM

## 2021-12-22 NOTE — Telephone Encounter (Signed)
Eliquis 5mg  refill request received. Patient is 88 years old, weight-83.9kg, Crea-1.05 on 08/04/2021, Diagnosis-Afib, and last seen by Dr. Stanford Breed on 02/04/2021. Dose is appropriate based on dosing criteria. Will send in refill to requested pharmacy.

## 2022-01-25 ENCOUNTER — Other Ambulatory Visit: Payer: Self-pay | Admitting: Family Medicine

## 2022-01-25 ENCOUNTER — Telehealth: Payer: Self-pay | Admitting: Family Medicine

## 2022-01-25 NOTE — Telephone Encounter (Signed)
Medication sent.

## 2022-01-25 NOTE — Telephone Encounter (Signed)
Medication: citalopram (CELEXA) 20 MG tablet  Has the patient contacted their pharmacy? No.   Preferred Pharmacy:    [facility] [address] [place], [place] - [address] [place] [address] Phone: [phone]  Fax: [phone]

## 2022-02-01 ENCOUNTER — Other Ambulatory Visit: Payer: Self-pay | Admitting: Family Medicine

## 2022-02-01 DIAGNOSIS — I509 Heart failure, unspecified: Secondary | ICD-10-CM

## 2022-02-09 ENCOUNTER — Other Ambulatory Visit: Payer: Self-pay

## 2022-02-09 ENCOUNTER — Ambulatory Visit (INDEPENDENT_AMBULATORY_CARE_PROVIDER_SITE_OTHER): Payer: Medicare Other | Admitting: Family Medicine

## 2022-02-09 VITALS — BP 128/72 | HR 62 | Temp 97.5°F | Resp 16 | Ht 61.0 in | Wt 180.2 lb

## 2022-02-09 DIAGNOSIS — R0600 Dyspnea, unspecified: Secondary | ICD-10-CM

## 2022-02-09 DIAGNOSIS — E7800 Pure hypercholesterolemia, unspecified: Secondary | ICD-10-CM

## 2022-02-09 DIAGNOSIS — R739 Hyperglycemia, unspecified: Secondary | ICD-10-CM

## 2022-02-09 DIAGNOSIS — E039 Hypothyroidism, unspecified: Secondary | ICD-10-CM | POA: Diagnosis not present

## 2022-02-09 DIAGNOSIS — R062 Wheezing: Secondary | ICD-10-CM

## 2022-02-09 DIAGNOSIS — Z780 Asymptomatic menopausal state: Secondary | ICD-10-CM

## 2022-02-09 DIAGNOSIS — I10 Essential (primary) hypertension: Secondary | ICD-10-CM

## 2022-02-09 DIAGNOSIS — Z0000 Encounter for general adult medical examination without abnormal findings: Secondary | ICD-10-CM

## 2022-02-09 DIAGNOSIS — E2839 Other primary ovarian failure: Secondary | ICD-10-CM

## 2022-02-09 DIAGNOSIS — N390 Urinary tract infection, site not specified: Secondary | ICD-10-CM | POA: Diagnosis not present

## 2022-02-09 DIAGNOSIS — M8580 Other specified disorders of bone density and structure, unspecified site: Secondary | ICD-10-CM

## 2022-02-09 DIAGNOSIS — N189 Chronic kidney disease, unspecified: Secondary | ICD-10-CM

## 2022-02-09 LAB — LIPID PANEL
Cholesterol: 136 mg/dL (ref 0–200)
HDL: 64.6 mg/dL (ref >39.00)
LDL Cholesterol: 59 mg/dL (ref 0–99)
NonHDL: 71.12
Total CHOL/HDL Ratio: 2
Triglycerides: 60 mg/dL (ref 0.0–149.0)
VLDL: 12 mg/dL (ref 0.0–40.0)

## 2022-02-09 LAB — URINALYSIS, ROUTINE W REFLEX MICROSCOPIC
Bilirubin Urine: NEGATIVE
Hgb urine dipstick: NEGATIVE
Ketones, ur: NEGATIVE
Nitrite: POSITIVE — AB
RBC / HPF: NONE SEEN (ref 0–?)
Specific Gravity, Urine: 1.01 (ref 1.000–1.030)
Total Protein, Urine: NEGATIVE
Urine Glucose: NEGATIVE
Urobilinogen, UA: 0.2 (ref 0.0–1.0)
pH: 6.5 (ref 5.0–8.0)

## 2022-02-09 LAB — HEMOGLOBIN A1C: Hgb A1c MFr Bld: 5.8 % (ref 4.6–6.5)

## 2022-02-09 LAB — CBC WITH DIFFERENTIAL/PLATELET
Basophils Absolute: 0.1 10*3/uL (ref 0.0–0.1)
Basophils Relative: 1.4 % (ref 0.0–3.0)
Eosinophils Absolute: 0.2 10*3/uL (ref 0.0–0.7)
Eosinophils Relative: 2.4 % (ref 0.0–5.0)
HCT: 41.6 % (ref 36.0–46.0)
Hemoglobin: 14.2 g/dL (ref 12.0–15.0)
Lymphocytes Relative: 18 % (ref 12.0–46.0)
Lymphs Abs: 1.3 10*3/uL (ref 0.7–4.0)
MCHC: 34.2 g/dL (ref 30.0–36.0)
MCV: 97.9 fl (ref 78.0–100.0)
Monocytes Absolute: 0.7 10*3/uL (ref 0.1–1.0)
Monocytes Relative: 10 % (ref 3.0–12.0)
Neutro Abs: 5 10*3/uL (ref 1.4–7.7)
Neutrophils Relative %: 68.2 % (ref 43.0–77.0)
Platelets: 226 10*3/uL (ref 150.0–400.0)
RBC: 4.26 Mil/uL (ref 3.87–5.11)
RDW: 13.5 % (ref 11.5–15.5)
WBC: 7.3 10*3/uL (ref 4.0–10.5)

## 2022-02-09 LAB — COMPREHENSIVE METABOLIC PANEL
ALT: 19 U/L (ref 0–35)
AST: 23 U/L (ref 0–37)
Albumin: 4 g/dL (ref 3.5–5.2)
Alkaline Phosphatase: 84 U/L (ref 39–117)
BUN: 20 mg/dL (ref 6–23)
CO2: 31 mEq/L (ref 19–32)
Calcium: 9.3 mg/dL (ref 8.4–10.5)
Chloride: 100 mEq/L (ref 96–112)
Creatinine, Ser: 1.06 mg/dL (ref 0.40–1.20)
GFR: 46.78 mL/min — ABNORMAL LOW (ref >60.00)
Glucose, Bld: 85 mg/dL (ref 70–99)
Potassium: 4.2 mEq/L (ref 3.5–5.1)
Sodium: 138 mEq/L (ref 135–145)
Total Bilirubin: 1.1 mg/dL (ref 0.2–1.2)
Total Protein: 7 g/dL (ref 6.0–8.3)

## 2022-02-09 LAB — TSH: TSH: 2.01 u[IU]/mL (ref 0.35–5.50)

## 2022-02-09 NOTE — Assessment & Plan Note (Signed)
On Levothyroxine, continue to monitor 

## 2022-02-09 NOTE — Assessment & Plan Note (Signed)
Patient encouraged to maintain heart healthy diet, regular exercise, adequate sleep. Consider daily probiotics. Take medications as prescribed Labs ordered and reviewed   colonoscopy aged out MGM aged out Pap aged out Dexa ordered today

## 2022-02-09 NOTE — Assessment & Plan Note (Signed)
Notes some odor, urinalysis shows possible UTI await urine culture

## 2022-02-09 NOTE — Progress Notes (Signed)
Subjective:   By signing my name below, I, Alyssa Williamson, attest that this documentation has been prepared under the direction and in the presence of Alyssa Lukes, MD., 02/09/2022.     Patient ID: Alyssa Williamson, female    DOB: [birth year], 88 y.o.   MRN: [id number]  No chief complaint on file.  HPI Patient is in today for a comprehensive physical exam and follow up on chronic medical concerns. She denies hearing loss/vision changes/skin concerns/ CP/palpitations/HA/congestion/ fevers/GI c/o.  Family History No changes to the FHx.  Lower Back Pain Patient's lower back pain is well-managed.  SOB/Wheezing Patient experiences shortness of breath/wheezing intermittently throughout the week upon exertion. She has never smoked cigarettes and denies breathing difficulty when laying down. She uses her Albuterol 1-2 times weekly which provides respiratory relief. She is seeing cardiologist Dr. Stanford Breed and is taking Eliquis 5 mg to manage her AFIB. She denies chest pain/sputum production.  Supplements She takes calcium and multivitamins daily.  UTI Patient experiences recurrent UTI's and suspects that she currently has one due to malodorous urine. She is requesting a urinary analysis. Denies urgency/frequency.  Weight Loss She has lost 5 lbs since her last visit through healthy eating. Body mass index is 34.05 kg/m. Wt Readings from Last 3 Encounters:  02/09/22 180 lb 3.2 oz (81.7 kg)  08/04/21 185 lb (83.9 kg)  03/25/21 184 lb 12.8 oz (83.8 kg)   Past Medical History:  Diagnosis Date   Allergic rhinitis    Anemia    Anxiety    Arthritis    bilateral knee   Atrial fibrillation ([place]) 12/21/2016   Cervical cancer screening 10/30/2013   Menarche at 15 Regular and moderate flow No history of abnormal pap in past G4P3, s/p 3 svd and 1 MC No history of abnormal MGM Noconcerns today No gyn surgeries   Constipation 11/18/2015   Depression with anxiety    Diverticulitis    Heart murmur     Hyperlipidemia    Hypertension    Hypothyroid    Left knee DJD    Medicare annual wellness visit, subsequent 10/30/2013   PONV (postoperative nausea and vomiting)    Preventative health care 05/20/2016   Past Surgical History:  Procedure Laterality Date   ABDOMINAL HYSTERECTOMY     bladder tack  04/2010   Dr Dinah Beers   ERCP N/A 07/16/2020   Procedure: ENDOSCOPIC RETROGRADE CHOLANGIOPANCREATOGRAPHY (ERCP);  Surgeon: Ronnette Juniper, MD;  Location: [facility];  Service: Gastroenterology;  Laterality: N/A;   GANGLION CYST EXCISION     JOINT REPLACEMENT Right    REMOVAL OF STONES  07/16/2020   Procedure: REMOVAL OF STONES;  Surgeon: Ronnette Juniper, MD;  Location: [facility];  Service: Gastroenterology;;   REPLACEMENT TOTAL KNEE  10/2010   right knee-[place]    SPHINCTEROTOMY  07/16/2020   Procedure: SPHINCTEROTOMY;  Surgeon: Ronnette Juniper, MD;  Location: [facility];  Service: Gastroenterology;;   TONSILLECTOMY     TOTAL KNEE ARTHROPLASTY Left 07/26/2012   Procedure: TOTAL KNEE ARTHROPLASTY;  Surgeon: Ninetta Lights, MD;  Location: [facility];  Service: Orthopedics;  Laterality: Left;   Family History  Problem Relation Age of Onset   Alcohol abuse Father    Breast cancer Sister    Cancer Sister        bladder   Glaucoma Sister    Arthritis Daughter    Diabetes Daughter        type 2   Cancer Daughter  brain cancer, diagnosed 29 years.agp   Mental illness Daughter        s/p craniotomies, gamma knife for tumors. numerous shunts.   Heart failure Mother    Cancer Brother        glioblastoma    Glaucoma Brother    Diverticulosis Sister    Cancer Sister        liver   Cancer Brother        melanoma, mets to lung and brain and intestine   Alcohol abuse Brother    Cancer Brother        liver   Glaucoma Maternal Grandfather    Cancer Sister        breast   Colon cancer Neg Hx    Hypertension Neg Hx    Social History   Socioeconomic History   Marital status: Widowed     Spouse name: Not on file   Number of children: 3   Years of education: Not on file   Highest education level: Not on file  Occupational History   Not on file  Tobacco Use   Smoking status: Never   Smokeless tobacco: Never  Vaping Use   Vaping Use: Never used  Substance and Sexual Activity   Alcohol use: No   Drug use: No   Sexual activity: Not on file  Other Topics Concern   Not on file  Social History Narrative   Not on file   Social Determinants of Health   Financial Resource Strain: Not on file  Food Insecurity: Not on file  Transportation Needs: Not on file  Physical Activity: Not on file  Stress: Not on file  Social Connections: Not on file  Intimate Partner Violence: Not on file   Outpatient Medications Prior to Visit  Medication Sig Dispense Refill   furosemide (LASIX) 20 MG tablet Take 1 tablet (20 mg total) by mouth daily. 90 tablet 0   levothyroxine (SYNTHROID) 75 MCG tablet Take 1 tablet (75 mcg total) by mouth daily before breakfast. 90 tablet 0   acetaminophen (TYLENOL) 325 MG tablet Take 2 tablets (650 mg total) by mouth every 6 (six) hours as needed for mild pain (or Fever >/= 101).     albuterol (VENTOLIN HFA) 108 (90 Base) MCG/ACT inhaler Inhale 1 puff into the lungs every 6 (six) hours as needed for wheezing or shortness of breath.     ALPRAZolam (XANAX) 0.25 MG tablet Take 1 tablet (0.25 mg total) by mouth daily as needed for anxiety. 30 tablet 1   apixaban (ELIQUIS) 5 MG TABS tablet Take 1 tablet by mouth twice daily 60 tablet 5   atorvastatin (LIPITOR) 40 MG tablet Take 1 tablet by mouth once daily 90 tablet 2   citalopram (CELEXA) 20 MG tablet Take 1 tablet by mouth once daily 90 tablet 0   Cranberry 500 MG CAPS Take 500 mg by mouth daily.     loratadine (CLARITIN) 10 MG tablet Take 10 mg by mouth daily.     Multiple Vitamins-Minerals (CENTRUM SILVER PO) Take 1 tablet by mouth daily.     valACYclovir (VALTREX) 1000 MG tablet Take 1 tablet (1,000 mg  total) by mouth 3 (three) times daily. 21 tablet 0   verapamil (CALAN-SR) 240 MG CR tablet Take 1 tablet (240 mg total) by mouth daily. 90 tablet 1   No facility-administered medications prior to visit.   Allergies  Allergen Reactions   Cefdinir Dermatitis   Codeine Nausea And Vomiting   Review of  Systems  Constitutional:  Negative for chills and fever.  HENT:  Negative for congestion and hearing loss.   Eyes:        (-) vision changes  Respiratory:  Positive for shortness of breath and wheezing. Negative for cough and sputum production.   Cardiovascular:  Negative for chest pain and palpitations.  Gastrointestinal:  Negative for abdominal pain, blood in stool, constipation, diarrhea, nausea and vomiting.  Genitourinary:  Negative for dysuria, frequency, hematuria and urgency.       (+) malodorous urine  Musculoskeletal:  Negative for back pain.  Skin:        (-) skin concerns   Neurological:  Negative for headaches.      Objective:    Physical Exam Constitutional:      General: She is not in acute distress.    Appearance: Normal appearance. She is normal weight. She is not ill-appearing.  HENT:     Head: Normocephalic and atraumatic.     Right Ear: Tympanic membrane, ear canal and external ear normal.     Left Ear: Tympanic membrane, ear canal and external ear normal.     Nose: Nose normal.     Mouth/Throat:     Mouth: Mucous membranes are moist.     Pharynx: Oropharynx is clear.  Eyes:     General:        Right eye: No discharge.        Left eye: No discharge.     Extraocular Movements: Extraocular movements intact.     Right eye: No nystagmus.     Left eye: No nystagmus.     Pupils: Pupils are equal, round, and reactive to light.  Neck:     Vascular: No carotid bruit.  Cardiovascular:     Rate and Rhythm: Normal rate and regular rhythm.     Pulses: Normal pulses.     Heart sounds: Normal heart sounds. No murmur heard.    No gallop.  Pulmonary:     Effort:  Pulmonary effort is normal. No respiratory distress.     Breath sounds: Normal breath sounds. No wheezing or rales.  Abdominal:     General: Bowel sounds are normal.     Palpations: Abdomen is soft.     Tenderness: There is no abdominal tenderness. There is no guarding.  Musculoskeletal:        General: Normal range of motion.     Cervical back: Normal range of motion.     Right lower leg: 1+ Edema present.     Left lower leg: 2+ Edema present.     Comments: Muscle strength 5/5 on upper and lower extremities.   Lymphadenopathy:     Cervical: No cervical adenopathy.  Skin:    General: Skin is warm and dry.  Neurological:     Mental Status: She is alert and oriented to person, place, and time.     Sensory: Sensation is intact.     Motor: Motor function is intact.     Coordination: Coordination is intact.     Deep Tendon Reflexes:     Reflex Scores:      Patellar reflexes are 2+ on the right side and 2+ on the left side. Psychiatric:        Mood and Affect: Mood normal.        Behavior: Behavior normal.        Judgment: Judgment normal.    There were no vitals taken for this visit. Wt Readings from Last  3 Encounters:  08/04/21 185 lb (83.9 kg)  03/25/21 184 lb 12.8 oz (83.8 kg)  02/04/21 185 lb 1.9 oz (84 kg)   Diabetic Foot Exam - Simple   No data filed    Lab Results  Component Value Date   WBC 6.9 08/04/2021   HGB 13.7 08/04/2021   HCT 40.6 08/04/2021   PLT 211.0 08/04/2021   GLUCOSE 85 08/04/2021   CHOL 140 08/04/2021   TRIG 57.0 08/04/2021   HDL 66.60 08/04/2021   LDLCALC 62 08/04/2021   ALT 20 08/04/2021   AST 23 08/04/2021   NA 137 08/04/2021   K 4.6 08/04/2021   CL 99 08/04/2021   CREATININE 1.05 08/04/2021   BUN 16 08/04/2021   CO2 32 08/04/2021   TSH 1.46 08/04/2021   INR 0.99 07/24/2012   HGBA1C 5.9 06/26/2020   Lab Results  Component Value Date   TSH 1.46 08/04/2021   Lab Results  Component Value Date   WBC 6.9 08/04/2021   HGB 13.7  08/04/2021   HCT 40.6 08/04/2021   MCV 96.5 08/04/2021   PLT 211.0 08/04/2021   Lab Results  Component Value Date   NA 137 08/04/2021   K 4.6 08/04/2021   CO2 32 08/04/2021   GLUCOSE 85 08/04/2021   BUN 16 08/04/2021   CREATININE 1.05 08/04/2021   BILITOT 1.4 (H) 08/04/2021   ALKPHOS 90 08/04/2021   AST 23 08/04/2021   ALT 20 08/04/2021   PROT 6.6 08/04/2021   ALBUMIN 3.8 08/04/2021   CALCIUM 9.2 08/04/2021   ANIONGAP 4 (L) 07/20/2020   EGFR 53 (L) 08/06/2020   GFR 47.49 (L) 08/04/2021   Lab Results  Component Value Date   CHOL 140 08/04/2021   Lab Results  Component Value Date   HDL 66.60 08/04/2021   Lab Results  Component Value Date   LDLCALC 62 08/04/2021   Lab Results  Component Value Date   TRIG 57.0 08/04/2021   Lab Results  Component Value Date   CHOLHDL 2 08/04/2021   Lab Results  Component Value Date   HGBA1C 5.9 06/26/2020      Assessment & Plan:  DEXA: Last completed on 08/18/2015. This patient is considered osteopenic according to [facility]) criteria. Repeat in 5 years. Order placed.  Mammogram: Last completed on 12/21/2016 with no mammographic evidence of malignancy.  Advanced Directives: Provided patient with advance care planning documents.  SOB: Echocardiogram ordered and pulmonology referral made.  Healthy Lifestyle: Encouraged exercise, heart healthy diet and hydration.  Immunizations: Encouraged COVID-19, RSV and Tetanus immunizations.  Labs: Routine blood work will be completed today as well as a urinary analysis due to recurrent UTI's.  Problem List Items Addressed This Visit   None  No orders of the defined types were placed in this encounter.  I, Alyssa Williamson, personally preformed the services described in this documentation.  All medical record entries made by the scribe were at my direction and in my presence.  I have reviewed the chart and discharge instructions (if applicable) and agree that the  record reflects my personal performance and is accurate and complete. 02/09/2022  I,Mohammed Iqbal,acting as a scribe for Penni Homans, MD.,have documented all relevant documentation on the behalf of Penni Homans, MD,as directed by  Penni Homans, MD while in the presence of Penni Homans, MD.  Alyssa Williamson

## 2022-02-09 NOTE — Patient Instructions (Addendum)
Shingrix is the new shingles shot, 2 shots over 2-6 months, confirm coverage with insurance and document, then can return here for shots with nurse appt or at pharmacy   Tetanus in 2024 or if injured  RSV, Respiratory Syncitial Virus vaccine, Arexvy take alone at pharmacy COVID booster     Preventive Care 65 Years and Older, Female Preventive care refers to lifestyle choices and visits with your health care provider that can promote health and wellness. Preventive care visits are also called wellness exams. What can I expect for my preventive care visit? Counseling Your health care provider may ask you questions about your: Medical history, including: Past medical problems. Family medical history. Pregnancy and menstrual history. History of falls. Current health, including: Memory and ability to understand (cognition). Emotional well-being. Home life and relationship well-being. Sexual activity and sexual health. Lifestyle, including: Alcohol, nicotine or tobacco, and drug use. Access to firearms. Diet, exercise, and sleep habits. Work and work Statistician. Sunscreen use. Safety issues such as seatbelt and bike helmet use. Physical exam Your health care provider will check your: Height and weight. These may be used to calculate your BMI (body mass index). BMI is a measurement that tells if you are at a healthy weight. Waist circumference. This measures the distance around your waistline. This measurement also tells if you are at a healthy weight and may help predict your risk of certain diseases, such as type 2 diabetes and high blood pressure. Heart rate and blood pressure. Body temperature. Skin for abnormal spots. What immunizations do I need?  Vaccines are usually given at various ages, according to a schedule. Your health care provider will recommend vaccines for you based on your age, medical history, and lifestyle or other factors, such as travel or where you  work. What tests do I need? Screening Your health care provider may recommend screening tests for certain conditions. This may include: Lipid and cholesterol levels. Hepatitis C test. Hepatitis B test. HIV (human immunodeficiency virus) test. STI (sexually transmitted infection) testing, if you are at risk. Lung cancer screening. Colorectal cancer screening. Diabetes screening. This is done by checking your blood sugar (glucose) after you have not eaten for a while (fasting). Mammogram. Talk with your health care provider about how often you should have regular mammograms. BRCA-related cancer screening. This may be done if you have a family history of breast, ovarian, tubal, or peritoneal cancers. Bone density scan. This is done to screen for osteoporosis. Talk with your health care provider about your test results, treatment options, and if necessary, the need for more tests. Follow these instructions at home: Eating and drinking  Eat a diet that includes fresh fruits and vegetables, whole grains, lean protein, and low-fat dairy products. Limit your intake of foods with high amounts of sugar, saturated fats, and salt. Take vitamin and mineral supplements as recommended by your health care provider. Do not drink alcohol if your health care provider tells you not to drink. If you drink alcohol: Limit how much you have to 0-1 drink a day. Know how much alcohol is in your drink. In the [place] one drink equals one 12 oz bottle of beer (355 mL), one 5 oz glass of wine (148 mL), or one 1 oz glass of hard liquor (44 mL). Lifestyle Brush your teeth every morning and night with fluoride toothpaste. Floss one time each day. Exercise for at least 30 minutes 5 or more days each week. Do not use any products that contain nicotine or  tobacco. These products include cigarettes, chewing tobacco, and vaping devices, such as e-cigarettes. If you need help quitting, ask your health care provider. Do not  use drugs. If you are sexually active, practice safe sex. Use a condom or other form of protection in order to prevent STIs. Take aspirin only as told by your health care provider. Make sure that you understand how much to take and what form to take. Work with your health care provider to find out whether it is safe and beneficial for you to take aspirin daily. Ask your health care provider if you need to take a cholesterol-lowering medicine (statin). Find healthy ways to manage stress, such as: Meditation, yoga, or listening to music. Journaling. Talking to a trusted person. Spending time with friends and family. Minimize exposure to UV radiation to reduce your risk of skin cancer. Safety Always wear your seat belt while driving or riding in a vehicle. Do not drive: If you have been drinking alcohol. Do not ride with someone who has been drinking. When you are tired or distracted. While texting. If you have been using any mind-altering substances or drugs. Wear a helmet and other protective equipment during sports activities. If you have firearms in your house, make sure you follow all gun safety procedures. What's next? Visit your health care provider once a year for an annual wellness visit. Ask your health care provider how often you should have your eyes and teeth checked. Stay up to date on all vaccines. This information is not intended to replace advice given to you by your health care provider. Make sure you discuss any questions you have with your health care provider. Document Revised: 08/06/2020 Document Reviewed: 08/06/2020 Elsevier Patient Education  [place].

## 2022-02-09 NOTE — Assessment & Plan Note (Signed)
Bone density shows osteopenia, which is thinner than normal but not as bad as osteoporosis. Recommend calcium intake of 1200 to 1500 mg daily, divided into roughly 3 doses. Best source is the diet and a single dairy serving is about 500 mg, a supplement of calcium citrate once or twice daily to balance diet is fine if not getting enough in diet. Also need Vitamin D 2000 IU caps, 1 cap daily if not already taking vitamin D. Also recommend weight baring exercise on hips and upper body to keep bones strong repeat Dexa ordered

## 2022-02-10 ENCOUNTER — Other Ambulatory Visit: Payer: Self-pay | Admitting: Family Medicine

## 2022-02-10 ENCOUNTER — Telehealth: Payer: Self-pay | Admitting: Family Medicine

## 2022-02-10 DIAGNOSIS — R0600 Dyspnea, unspecified: Secondary | ICD-10-CM | POA: Insufficient documentation

## 2022-02-10 MED ORDER — ALPRAZOLAM 0.25 MG PO TABS: 0.2500 mg | ORAL_TABLET | Freq: Every day | ORAL | 1 refills | Status: DC | PRN

## 2022-02-10 MED ORDER — ALBUTEROL SULFATE HFA 108 (90 BASE) MCG/ACT IN AERS: 1.0000 | INHALATION_SPRAY | Freq: Four times a day (QID) | RESPIRATORY_TRACT | 2 refills | Status: DC | PRN

## 2022-02-10 NOTE — Assessment & Plan Note (Signed)
Encourage heart healthy diet such as MIND or DASH diet, increase exercise, avoid trans fats, simple carbohydrates and processed foods, consider a krill or fish or flaxseed oil cap daily.  °

## 2022-02-10 NOTE — Assessment & Plan Note (Signed)
hgba1c acceptable, minimize simple carbs. Increase exercise as tolerated.  

## 2022-02-10 NOTE — Assessment & Plan Note (Signed)
With exertion really for the past year. Cardiology work up has not identified cause so with persistent symptoms referred to pulmonology for further evaluation. Seek care if worsens

## 2022-02-10 NOTE — Assessment & Plan Note (Signed)
Well controlled, no changes to meds. Encouraged heart healthy diet such as the DASH diet and exercise as tolerated.  °

## 2022-02-10 NOTE — Telephone Encounter (Signed)
Patient called to see if her inhaler and Alprazolam was called in. Please send in to [facility] on [address].

## 2022-02-10 NOTE — Assessment & Plan Note (Signed)
Noted only with exertion, repeat echo

## 2022-02-11 ENCOUNTER — Ambulatory Visit (INDEPENDENT_AMBULATORY_CARE_PROVIDER_SITE_OTHER): Payer: Medicare Other | Admitting: *Deleted

## 2022-02-11 DIAGNOSIS — Z0000 Encounter for general adult medical examination without abnormal findings: Secondary | ICD-10-CM | POA: Diagnosis not present

## 2022-02-11 NOTE — Progress Notes (Signed)
Subjective:   Alyssa Williamson is a 88 y.o. female who presents for Medicare Annual (Subsequent) preventive examination.  I connected with  Alyssa Williamson on 02/11/22 by a audio enabled telemedicine application and verified that I am speaking with the correct person using two identifiers.  Patient Location: Home  Provider Location: Office/Clinic  I discussed the limitations of evaluation and management by telemedicine. The patient expressed understanding and agreed to proceed.   Review of Systems    Defer to PCP Cardiac Risk Factors include: advanced age (>62men, >33 women);dyslipidemia;hypertension;obesity (BMI >30kg/m2)     Objective:    There were no vitals filed for this visit. There is no height or weight on file to calculate BMI.     02/11/2022    3:03 PM 07/19/2020    9:00 PM 07/16/2020    9:18 AM 07/14/2020    3:19 PM 07/14/2020    8:10 AM 06/09/2020    1:08 PM 04/21/2020   10:58 AM  Advanced Directives  Does Patient Have a Medical Advance Directive? Yes Yes Yes No No Yes Yes  Type of Estate agent of [place];Living will Healthcare Power of [place];Living will Healthcare Power of [place];Living will   Healthcare Power of [place];Living will Healthcare Power of [place];Living will  Does patient want to make changes to medical advance directive? No - Patient declined No - Patient declined       Copy of Healthcare Power of Attorney in Chart? No - copy requested  No - copy requested    No - copy requested  Would patient like information on creating a medical advance directive?   No - Patient declined No - Patient declined       Current Medications (verified) Outpatient Encounter Medications as of 02/11/2022  Medication Sig   acetaminophen (TYLENOL) 325 MG tablet Take 2 tablets (650 mg total) by mouth every 6 (six) hours as needed for mild pain (or Fever >/= 101).   albuterol (VENTOLIN HFA) 108 (90 Base) MCG/ACT inhaler Inhale 1 puff into the lungs  every 6 (six) hours as needed for wheezing or shortness of breath.   ALPRAZolam (XANAX) 0.25 MG tablet Take 1 tablet (0.25 mg total) by mouth daily as needed for anxiety.   apixaban (ELIQUIS) 5 MG TABS tablet Take 1 tablet by mouth twice daily   atorvastatin (LIPITOR) 40 MG tablet Take 1 tablet by mouth once daily   citalopram (CELEXA) 20 MG tablet Take 1 tablet by mouth once daily   Cranberry 500 MG CAPS Take 500 mg by mouth daily.   furosemide (LASIX) 20 MG tablet Take 1 tablet (20 mg total) by mouth daily.   levothyroxine (SYNTHROID) 75 MCG tablet Take 1 tablet (75 mcg total) by mouth daily before breakfast.   loratadine (CLARITIN) 10 MG tablet Take 10 mg by mouth daily.   Multiple Vitamins-Minerals (CENTRUM SILVER PO) Take 1 tablet by mouth daily.   verapamil (CALAN-SR) 240 MG CR tablet Take 1 tablet (240 mg total) by mouth daily.   No facility-administered encounter medications on file as of 02/11/2022.    Allergies (verified) Cefdinir and Codeine   History: Past Medical History:  Diagnosis Date   Allergic rhinitis    Anemia    Anxiety    Arthritis    bilateral knee   Atrial fibrillation (HCC) 12/21/2016   Cervical cancer screening 10/30/2013   Menarche at 15 Regular and moderate flow No history of abnormal pap in past G4P3, s/p 3 svd and 1  MC No history of abnormal MGM Noconcerns today No gyn surgeries   Constipation 11/18/2015   Depression with anxiety    Diverticulitis    Heart murmur    Hyperlipidemia    Hypertension    Hypothyroid    Left knee DJD    Medicare annual wellness visit, subsequent 10/30/2013   PONV (postoperative nausea and vomiting)    Preventative health care 05/20/2016   Past Surgical History:  Procedure Laterality Date   ABDOMINAL HYSTERECTOMY     bladder tack  04/2010   Dr Marciano Sequinimothy  [place]   ERCP N/A 07/16/2020   Procedure: ENDOSCOPIC RETROGRADE CHOLANGIOPANCREATOGRAPHY (ERCP);  Surgeon: Kerin SalenKarki, Arya, MD;  [facility];  Service:  Gastroenterology;  Laterality: N/A;   GANGLION CYST EXCISION     JOINT REPLACEMENT Right    REMOVAL OF STONES  07/16/2020   Procedure: REMOVAL OF STONES;  Surgeon: Kerin SalenKarki, Arya, MD;  Location: [facility];  Service: Gastroenterology;;   REPLACEMENT TOTAL KNEE  10/2010   right knee-Murphy    SPHINCTEROTOMY  07/16/2020   Procedure: SPHINCTEROTOMY;  Surgeon: Kerin SalenKarki, Arya, MD;  Location: [facility];  Service: Gastroenterology;;   TONSILLECTOMY     TOTAL KNEE ARTHROPLASTY Left 07/26/2012   Procedure: TOTAL KNEE ARTHROPLASTY;  Surgeon: Loreta Aveaniel F Murphy, MD;  [facility];  Service: Orthopedics;  Laterality: Left;   Family History  Problem Relation Age of Onset   Alcohol abuse Father    Breast cancer Sister    Cancer Sister        bladder   Glaucoma Sister    Arthritis Daughter    Diabetes Daughter        type 2   Cancer Daughter        brain cancer, diagnosed 29 years.agp   Mental illness Daughter        s/p craniotomies, gamma knife for tumors. numerous shunts.   Heart failure Mother    Cancer Brother        glioblastoma    Glaucoma Brother    Diverticulosis Sister    Cancer Sister        liver   Cancer Brother        melanoma, mets to lung and brain and intestine   Alcohol abuse Brother    Cancer Brother        liver   Glaucoma Maternal Grandfather    Cancer Sister        breast   Colon cancer Neg Hx    Hypertension Neg Hx    Social History   Socioeconomic History   Marital status: Widowed    Spouse name: Not on file   Number of children: 3   Years of education: Not on file   Highest education level: Not on file  Occupational History   Not on file  Tobacco Use   Smoking status: Never   Smokeless tobacco: Never  Vaping Use   Vaping Use: Never used  Substance and Sexual Activity   Alcohol use: No   Drug use: No   Sexual activity: Not on file  Other Topics Concern   Not on file  Social History Narrative   Not on file   Social Determinants of Health    Financial Resource Strain: Low Risk  (02/11/2022)   Overall Financial Resource Strain (CARDIA)    Difficulty of Paying Living Expenses: Not hard at all  Food Insecurity: No Food Insecurity (02/11/2022)   Hunger Vital Sign    Worried About Running Out of  Food in the Last Year: Never true    Ran Out of Food in the Last Year: Never true  Transportation Needs: No Transportation Needs (02/11/2022)   PRAPARE - Administrator, Civil Service (Medical): No    Lack of Transportation (Non-Medical): No  Physical Activity: Inactive (02/11/2022)   Exercise Vital Sign    Days of Exercise per Week: 0 days    Minutes of Exercise per Session: 0 min  Stress: No Stress Concern Present (02/11/2022)   [employer] of Occupational Health - Occupational Stress Questionnaire    Feeling of Stress : Not at all  Social Connections: Moderately Integrated (02/11/2022)   Social Connection and Isolation Panel [NHANES]    Frequency of Communication with Friends and Family: More than three times a week    Frequency of Social Gatherings with Friends and Family: Once a week    Attends Religious Services: More than 4 times per year    Active Member of [employer] or Organizations: Yes    Attends Banker Meetings: More than 4 times per year    Marital Status: Widowed    Tobacco Counseling Counseling given: Not Answered   Clinical Intake:  Pre-visit preparation completed: No  Pain : No/denies pain  Diabetes: No  How often do you need to have someone help you when you read instructions, pamphlets, or other written materials from your doctor or pharmacy?: 1 - Never   Activities of Daily Living    02/11/2022    3:08 PM  In your present state of health, do you have any difficulty performing the following activities:  Hearing? 1  Comment hearing loss  Vision? 0  Difficulty concentrating or making decisions? 1  Comment some slight memory loss  Walking or climbing stairs? 0   Dressing or bathing? 0  Doing errands, shopping? 0  Preparing Food and eating ? N  Using the Toilet? N  In the past six months, have you accidently leaked urine? Y  Do you have problems with loss of bowel control? N  Managing your Medications? N  Managing your Finances? N  Housekeeping or managing your Housekeeping? N    Patient Care Team: Bradd Canary, MD as PCP - General (Family Medicine) Jens Som Madolyn Frieze, MD as PCP - Cardiology (Cardiology) Su Grand, MD (Inactive) as Consulting Physician (Urology) Norva Riffle (Dermatology) Lewayne Bunting, MD as Consulting Physician (Cardiology)  Indicate any recent Medical Services you may have received from other than [facility] providers in the past year (date may be approximate).     Assessment:   This is a routine wellness examination for [place].  Hearing/Vision screen No results found.  Dietary issues and exercise activities discussed: Current Exercise Habits: The patient does not participate in regular exercise at present, Exercise limited by: cardiac condition(s)   Goals Addressed   None    Depression Screen    02/11/2022    3:02 PM 02/09/2022   10:06 AM 08/04/2021   10:23 AM 07/08/2020    9:01 AM 06/26/2020   10:56 AM 04/04/2017    3:21 PM 02/10/2015   10:22 AM  PHQ 2/9 Scores  PHQ - 2 Score 0 0 0 1 1 0 0  PHQ- 9 Score  0 0 1       Fall Risk    02/11/2022    3:02 PM 02/09/2022   10:06 AM 08/04/2021   10:22 AM 10/23/2020    1:33 PM 07/08/2020    9:02 AM  Fall Risk   Falls in the past year? 1 1 0 0 1  Number falls in past yr: 0 1 0 0 0  Injury with Fall? 1 1 0 0 1  Risk for fall due to : History of fall(s)  No Fall Risks No Fall Risks   Follow up Falls evaluation completed Falls evaluation completed Falls evaluation completed Falls evaluation completed     FALL RISK PREVENTION PERTAINING TO THE HOME:  Any stairs in or around the home? No  If so, are there any without handrails? No  Home free of  loose throw rugs in walkways, pet beds, electrical cords, etc? Yes  Adequate lighting in your home to reduce risk of falls? Yes   ASSISTIVE DEVICES UTILIZED TO PREVENT FALLS:  Life alert? No  Use of a cane, walker or w/c? No  Grab bars in the bathroom? Yes  Shower chair or bench in shower? No  Elevated toilet seat or a handicapped toilet? No   TIMED UP AND GO:  Was the test performed?  No, audio visit .    Cognitive Function:        02/11/2022    3:13 PM  6CIT Screen  What Year? 0 points  What month? 0 points  What time? 0 points  Count back from 20 2 points  Months in reverse 0 points  Repeat phrase 0 points  Total Score 2 points    Immunizations Immunization History  Administered Date(s) Administered   Fluad Quad(high Dose 65+) 11/13/2018, 12/25/2019, 01/29/2021, 12/22/2021   Influenza Split 11/12/2011, 11/17/2012   Influenza Whole 11/17/2012   Influenza, High Dose Seasonal PF 11/18/2015, 12/27/2016, 01/05/2018, 11/13/2018, 12/25/2019, 01/29/2021   Influenza,inj,Quad PF,6+ Mos 10/30/2013   Influenza-Unspecified 11/12/2011, 12/24/2014   PFIZER Comirnaty(Gray Top)Covid-19 Tri-Sucrose Vaccine 03/15/2019, 05/07/2019, 11/26/2019, 06/26/2020   PFIZER(Purple Top)SARS-COV-2 Vaccination 03/15/2019, 05/07/2019, 11/26/2019   Pneumococcal Conjugate-13 10/30/2013   Pneumococcal Polysaccharide-23 02/10/2015   Tdap 09/04/2012    TDAP status: Up to date  Flu Vaccine status: Up to date  Pneumococcal vaccine status: Up to date  Covid-19 vaccine status: Information provided on how to obtain vaccines.   Qualifies for Shingles Vaccine? Yes   Zostavax completed No   Shingrix Completed?: No.    Education has been provided regarding the importance of this vaccine. Patient has been advised to call insurance company to determine out of pocket expense if they have not yet received this vaccine. Advised may also receive vaccine at local pharmacy or Health Dept. Verbalized acceptance  and understanding.  Screening Tests Health Maintenance  Topic Date Due   Zoster Vaccines- Shingrix (1 of 2) Never done   Medicare Annual Wellness (AWV)  11/11/2015   COVID-19 Vaccine (8 - 2023-24 season) 02/19/2022 (Originally 10/23/2021)   DTaP/Tdap/Td (2 - Td or Tdap) 09/05/2022   Pneumonia Vaccine 61+ Years old  Completed   INFLUENZA VACCINE  Completed   DEXA SCAN  Completed   HPV VACCINES  Aged Out    Health Maintenance  Health Maintenance Due  Topic Date Due   Zoster Vaccines- Shingrix (1 of 2) Never done   Medicare Annual Wellness (AWV)  11/11/2015    Colorectal cancer screening: No longer required.   Mammogram status: No longer required due to age.  Bone Density status: Ordered pt scheduled for 02/23/22. Pt provided with contact info and advised to call to schedule appt.  Lung Cancer Screening: (Low Dose CT Chest recommended if Age 70-80 years, 30 pack-year currently smoking OR have quit w/in  15years.) does not qualify.   Additional Screening:  Hepatitis C Screening: does not qualify  Vision Screening: Recommended annual ophthalmology exams for early detection of glaucoma and other disorders of the eye. Is the patient up to date with their annual eye exam?  Yes  Who is the provider or what is the name of the office in which the patient attends annual eye exams? [facility]. If pt is not established with a provider, would they like to be referred to a provider to establish care? No .   Dental Screening: Recommended annual dental exams for proper oral hygiene  Community Resource Referral / Chronic Care Management: CRR required this visit?  No   CCM required this visit?  No      Plan:     I have personally reviewed and noted the following in the patient's chart:   Medical and social history Use of alcohol, tobacco or illicit drugs  Current medications and supplements including opioid prescriptions. Patient is not currently taking opioid  prescriptions. Functional ability and status Nutritional status Physical activity Advanced directives List of other physicians Hospitalizations, surgeries, and ER visits in previous 12 months Vitals Screenings to include cognitive, depression, and falls Referrals and appointments  In addition, I have reviewed and discussed with patient certain preventive protocols, quality metrics, and best practice recommendations. A written personalized care plan for preventive services as well as general preventive health recommendations were provided to patient.   Due to this being a telephonic visit, the after visit summary with patients personalized plan was offered to patient via mail or my-chart. Per request, patient was mailed a copy of AVS.  Donne Anon, CMA   02/11/2022   Nurse Notes: None

## 2022-02-11 NOTE — Patient Instructions (Signed)
[name] , Thank you for taking time to come for your Medicare Wellness Visit. I appreciate your ongoing commitment to your health goals. Please review the following plan we discussed and let me know if I can assist you in the future.   These are the goals we discussed:  Goals   None     This is a list of the screening recommended for you and due dates:  Health Maintenance  Topic Date Due   Zoster (Shingles) Vaccine (1 of 2) Never done   COVID-19 Vaccine (8 - 2023-24 season) 02/19/2022*   DTaP/Tdap/Td vaccine (2 - Td or Tdap) 09/05/2022   Medicare Annual Wellness Visit  02/12/2023   Pneumonia Vaccine  Completed   Flu Shot  Completed   DEXA scan (bone density measurement)  Completed   HPV Vaccine  Aged Out  *Topic was postponed. The date shown is not the original due date.     Next appointment: Follow up in one year for your annual wellness visit.   Preventive Care 80 Years and Older, Female Preventive care refers to lifestyle choices and visits with your health care provider that can promote health and wellness. What does preventive care include? A yearly physical exam. This is also called an annual well check. Dental exams once or twice a year. Routine eye exams. Ask your health care provider how often you should have your eyes checked. Personal lifestyle choices, including: Daily care of your teeth and gums. Regular physical activity. Eating a healthy diet. Avoiding tobacco and drug use. Limiting alcohol use. Practicing safe sex. Taking low-dose aspirin every day. Taking vitamin and mineral supplements as recommended by your health care provider. What happens during an annual well check? The services and screenings done by your health care provider during your annual well check will depend on your age, overall health, lifestyle risk factors, and family history of disease. Counseling  Your health care provider may ask you questions about your: Alcohol use. Tobacco  use. Drug use. Emotional well-being. Home and relationship well-being. Sexual activity. Eating habits. History of falls. Memory and ability to understand (cognition). Work and work Astronomer. Reproductive health. Screening  You may have the following tests or measurements: Height, weight, and BMI. Blood pressure. Lipid and cholesterol levels. These may be checked every 5 years, or more frequently if you are over 57 years old. Skin check. Lung cancer screening. You may have this screening every year starting at age 29 if you have a 30-pack-year history of smoking and currently smoke or have quit within the past 15 years. Fecal occult blood test (FOBT) of the stool. You may have this test every year starting at age 37. Flexible sigmoidoscopy or colonoscopy. You may have a sigmoidoscopy every 5 years or a colonoscopy every 10 years starting at age 72. Hepatitis C blood test. Hepatitis B blood test. Sexually transmitted disease (STD) testing. Diabetes screening. This is done by checking your blood sugar (glucose) after you have not eaten for a while (fasting). You may have this done every 1-3 years. Bone density scan. This is done to screen for osteoporosis. You may have this done starting at age 21. Mammogram. This may be done every 1-2 years. Talk to your health care provider about how often you should have regular mammograms. Talk with your health care provider about your test results, treatment options, and if necessary, the need for more tests. Vaccines  Your health care provider may recommend certain vaccines, such as: Influenza vaccine. This is  recommended every year. Tetanus, diphtheria, and acellular pertussis (Tdap, Td) vaccine. You may need a Td booster every 10 years. Zoster vaccine. You may need this after age 16. Pneumococcal 13-valent conjugate (PCV13) vaccine. One dose is recommended after age 3. Pneumococcal polysaccharide (PPSV23) vaccine. One dose is recommended  after age 64. Talk to your health care provider about which screenings and vaccines you need and how often you need them. This information is not intended to replace advice given to you by your health care provider. Make sure you discuss any questions you have with your health care provider. Document Released: 03/07/2015 Document Revised: 10/29/2015 Document Reviewed: 12/10/2014 Elsevier Interactive Patient Education  2017 [place] Prevention in the Home Falls can cause injuries. They can happen to people of all ages. There are many things you can do to make your home safe and to help prevent falls. What can I do on the outside of my home? Regularly fix the edges of walkways and driveways and fix any cracks. Remove anything that might make you trip as you walk through a door, such as a raised step or threshold. Trim any bushes or trees on the path to your home. Use bright outdoor lighting. Clear any walking paths of anything that might make someone trip, such as rocks or tools. Regularly check to see if handrails are loose or broken. Make sure that both sides of any steps have handrails. Any raised decks and porches should have guardrails on the edges. Have any leaves, snow, or ice cleared regularly. Use sand or salt on walking paths during winter. Clean up any spills in your garage right away. This includes oil or grease spills. What can I do in the bathroom? Use night lights. Install grab bars by the toilet and in the tub and shower. Do not use towel bars as grab bars. Use non-skid mats or decals in the tub or shower. If you need to sit down in the shower, use a plastic, non-slip stool. Keep the floor dry. Clean up any water that spills on the floor as soon as it happens. Remove soap buildup in the tub or shower regularly. Attach bath mats securely with double-sided non-slip rug tape. Do not have throw rugs and other things on the floor that can make you trip. What can I do  in the bedroom? Use night lights. Make sure that you have a light by your bed that is easy to reach. Do not use any sheets or blankets that are too big for your bed. They should not hang down onto the floor. Have a firm chair that has side arms. You can use this for support while you get dressed. Do not have throw rugs and other things on the floor that can make you trip. What can I do in the kitchen? Clean up any spills right away. Avoid walking on wet floors. Keep items that you use a lot in easy-to-reach places. If you need to reach something above you, use a strong step stool that has a grab bar. Keep electrical cords out of the way. Do not use floor polish or wax that makes floors slippery. If you must use wax, use non-skid floor wax. Do not have throw rugs and other things on the floor that can make you trip. What can I do with my stairs? Do not leave any items on the stairs. Make sure that there are handrails on both sides of the stairs and use them. Fix handrails that are  broken or loose. Make sure that handrails are as long as the stairways. Check any carpeting to make sure that it is firmly attached to the stairs. Fix any carpet that is loose or worn. Avoid having throw rugs at the top or bottom of the stairs. If you do have throw rugs, attach them to the floor with carpet tape. Make sure that you have a light switch at the top of the stairs and the bottom of the stairs. If you do not have them, ask someone to add them for you. What else can I do to help prevent falls? Wear shoes that: Do not have high heels. Have rubber bottoms. Are comfortable and fit you well. Are closed at the toe. Do not wear sandals. If you use a stepladder: Make sure that it is fully opened. Do not climb a closed stepladder. Make sure that both sides of the stepladder are locked into place. Ask someone to hold it for you, if possible. Clearly mark and make sure that you can see: Any grab bars or  handrails. First and last steps. Where the edge of each step is. Use tools that help you move around (mobility aids) if they are needed. These include: Canes. Walkers. Scooters. Crutches. Turn on the lights when you go into a dark area. Replace any light bulbs as soon as they burn out. Set up your furniture so you have a clear path. Avoid moving your furniture around. If any of your floors are uneven, fix them. If there are any pets around you, be aware of where they are. Review your medicines with your doctor. Some medicines can make you feel dizzy. This can increase your chance of falling. Ask your doctor what other things that you can do to help prevent falls. This information is not intended to replace advice given to you by your health care provider. Make sure you discuss any questions you have with your health care provider. Document Released: 12/05/2008 Document Revised: 07/17/2015 Document Reviewed: 03/15/2014 Elsevier Interactive Patient Education  2017 [employer].

## 2022-02-12 ENCOUNTER — Other Ambulatory Visit: Payer: Self-pay | Admitting: Family Medicine

## 2022-02-12 LAB — URINE CULTURE
MICRO NUMBER:: 14334367
SPECIMEN QUALITY:: ADEQUATE

## 2022-02-12 MED ORDER — LEVOFLOXACIN 250 MG PO TABS: 250.0000 mg | ORAL_TABLET | ORAL | 0 refills | Status: DC

## 2022-02-23 ENCOUNTER — Ambulatory Visit (HOSPITAL_BASED_OUTPATIENT_CLINIC_OR_DEPARTMENT_OTHER)
Admission: RE | Admit: 2022-02-23 | Discharge: 2022-02-23 | Disposition: A | Discharge status: Home / Self Care | Payer: Medicare Other | Source: Ambulatory Visit | Attending: Family Medicine | Admitting: Family Medicine

## 2022-02-23 DIAGNOSIS — Z780 Asymptomatic menopausal state: Secondary | ICD-10-CM | POA: Diagnosis present

## 2022-02-23 DIAGNOSIS — M8580 Other specified disorders of bone density and structure, unspecified site: Secondary | ICD-10-CM | POA: Diagnosis present

## 2022-02-23 DIAGNOSIS — E2839 Other primary ovarian failure: Secondary | ICD-10-CM | POA: Insufficient documentation

## 2022-02-25 ENCOUNTER — Ambulatory Visit (HOSPITAL_BASED_OUTPATIENT_CLINIC_OR_DEPARTMENT_OTHER): Payer: Medicare Other

## 2022-03-01 ENCOUNTER — Ambulatory Visit (INDEPENDENT_AMBULATORY_CARE_PROVIDER_SITE_OTHER): Payer: Medicare Other | Admitting: Pulmonary Disease

## 2022-03-01 ENCOUNTER — Encounter (HOSPITAL_BASED_OUTPATIENT_CLINIC_OR_DEPARTMENT_OTHER): Payer: Self-pay | Admitting: Pulmonary Disease

## 2022-03-01 VITALS — BP 118/66 | HR 55 | Temp 97.7°F | Ht 62.0 in | Wt 182.2 lb

## 2022-03-01 DIAGNOSIS — I5033 Acute on chronic diastolic (congestive) heart failure: Secondary | ICD-10-CM | POA: Diagnosis not present

## 2022-03-01 DIAGNOSIS — R0609 Other forms of dyspnea: Secondary | ICD-10-CM | POA: Diagnosis not present

## 2022-03-01 LAB — PULMONARY FUNCTION TEST
FEF 25-75 Post: 0.61 L/sec
FEF 25-75 Pre: 0.63 L/sec
FEF2575-%Change-Post: -3 %
FEF2575-%Pred-Post: 70 %
FEF2575-%Pred-Pre: 72 %
FEV1-%Change-Post: 1 %
FEV1-%Pred-Post: 70 %
FEV1-%Pred-Pre: 69 %
FEV1-Post: 1.03 L
FEV1-Pre: 1.02 L
FEV1FVC-%Change-Post: 6 %
FEV1FVC-%Pred-Pre: 98 %
FEV6-%Change-Post: -4 %
FEV6-%Pred-Post: 73 %
FEV6-%Pred-Pre: 76 %
FEV6-Post: 1.37 L
FEV6-Pre: 1.43 L
FEV6FVC-%Pred-Post: 107 %
FEV6FVC-%Pred-Pre: 107 %
FVC-%Change-Post: -4 %
FVC-%Pred-Post: 68 %
FVC-%Pred-Pre: 71 %
FVC-Post: 1.37 L
FVC-Pre: 1.43 L
Post FEV1/FVC ratio: 76 %
Post FEV6/FVC ratio: 100 %
Pre FEV1/FVC ratio: 71 %
Pre FEV6/FVC Ratio: 100 %

## 2022-03-01 NOTE — Patient Instructions (Signed)
Increase Lasix to 40 mg daily x 3-5 days until weight comes down to 175 pounds, then you can resume 20 mg daily  Breathing test Use albuterol as needed

## 2022-03-01 NOTE — Progress Notes (Signed)
Pre/Post Spirometry Performed Today. 

## 2022-03-01 NOTE — Progress Notes (Signed)
Subjective:    Patient ID: Alyssa Williamson, female    DOB: Sep 16, 1931, 89 y.o.   MRN: [id number]  HPI  Chief Complaint  Patient presents with   Consult    Pt states she has been experiencing some wheezing and SOB x 3 years. PCP sent her here.    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  This has been ongoing for 2 years, worse over the last 6 months.  She reports shortness of breath while walking to the mailbox This occurs every day.  She was prescribed albuterol MDI by her PCP and this seems to help.  Shortness of breath is also relieved by resting. Reviewed PCP visit, echocardiogram was ordered this is yet to be done.  She reports increasing pedal edema over the last few months in spite of taking 20 mg of Lasix daily. She denies paroxysmal nocturnal dyspnea or orthopnea.  She states that she has been sleeping in her chair for the last 2 months and this seems to help with her breathing  PMH -she sees Dr. Stanford Breed for chronic diastolic heart failure and paroxysmal atrial fibrillation, maintained on 20 mg of Lasix, verapamil and apixaban Echo 06/2020 showed normal LVEF, grade 2 diastolic dysfunction, RVSP 37.      Past Medical History:  Diagnosis Date   Allergic rhinitis    Anemia    Anxiety    Arthritis    bilateral knee   Atrial fibrillation ([place]) 12/21/2016   Cervical cancer screening 10/30/2013   Menarche at 15 Regular and moderate flow No history of abnormal pap in past G4P3, s/p 3 svd and 1 MC No history of abnormal MGM Noconcerns today No gyn surgeries   Constipation 11/18/2015   Depression with anxiety    Diverticulitis    Heart murmur    Hyperlipidemia    Hypertension    Hypothyroid    Left knee DJD    Medicare annual wellness visit, subsequent 10/30/2013   PONV (postoperative nausea and vomiting)    Preventative health care 05/20/2016   Past Surgical History:  Procedure Laterality Date   ABDOMINAL HYSTERECTOMY      bladder tack  04/2010   Dr Dinah Beers   ERCP N/A 07/16/2020   Procedure: ENDOSCOPIC RETROGRADE CHOLANGIOPANCREATOGRAPHY (ERCP);  Surgeon: Ronnette Juniper, MD;  Location: [facility];  Service: Gastroenterology;  Laterality: N/A;   GANGLION CYST EXCISION     JOINT REPLACEMENT Right    REMOVAL OF STONES  07/16/2020   Procedure: REMOVAL OF STONES;  Surgeon: Ronnette Juniper, MD;  Location: [facility];  Service: Gastroenterology;;   REPLACEMENT TOTAL KNEE  10/2010   right knee-Murphy    SPHINCTEROTOMY  07/16/2020   Procedure: SPHINCTEROTOMY;  Surgeon: Ronnette Juniper, MD;  Location: [facility];  Service: Gastroenterology;;   TONSILLECTOMY     TOTAL KNEE ARTHROPLASTY Left 07/26/2012   Procedure: TOTAL KNEE ARTHROPLASTY;  Surgeon: Ninetta Lights, MD;  Location: [facility];  Service: Orthopedics;  Laterality: Left;   Allergies  Allergen Reactions   Cefdinir Dermatitis   Codeine Nausea And Vomiting    Social History   Socioeconomic History   Marital status: Widowed    Spouse name: Not on file   Number of children: 3   Years of education: Not on file   Highest education level: Not on file  Occupational History   Not on file  Tobacco Use   Smoking status: Never   Smokeless tobacco: Never  Vaping Use  Vaping Use: Never used  Substance and Sexual Activity   Alcohol use: No   Drug use: No   Sexual activity: Not on file  Other Topics Concern   Not on file  Social History Narrative   Not on file   Social Determinants of Health   Financial Resource Strain: Low Risk  (02/11/2022)   Overall Financial Resource Strain (CARDIA)    Difficulty of Paying Living Expenses: Not hard at all  Food Insecurity: No Food Insecurity (02/11/2022)   Hunger Vital Sign    Worried About Running Out of Food in the Last Year: Never true    Ran Out of Food in the Last Year: Never true  Transportation Needs: No Transportation Needs (02/11/2022)   PRAPARE - Administrator, Civil Service (Medical): No     Lack of Transportation (Non-Medical): No  Physical Activity: Inactive (02/11/2022)   Exercise Vital Sign    Days of Exercise per Week: 0 days    Minutes of Exercise per Session: 0 min  Stress: No Stress Concern Present (02/11/2022)   [employer] of Occupational Health - Occupational Stress Questionnaire    Feeling of Stress : Not at all  Social Connections: Moderately Integrated (02/11/2022)   Social Connection and Isolation Panel [NHANES]    Frequency of Communication with Friends and Family: More than three times a week    Frequency of Social Gatherings with Friends and Family: Once a week    Attends Religious Services: More than 4 times per year    Active Member of [employer] or Organizations: Yes    Attends Banker Meetings: More than 4 times per year    Marital Status: Widowed  Intimate Partner Violence: Not At Risk (02/11/2022)   Humiliation, Afraid, Rape, and Kick questionnaire    Fear of Current or Ex-Partner: No    Emotionally Abused: No    Physically Abused: No    Sexually Abused: No    Family History  Problem Relation Age of Onset   Alcohol abuse Father    Breast cancer Sister    Cancer Sister        bladder   Glaucoma Sister    Arthritis Daughter    Diabetes Daughter        type 2   Cancer Daughter        brain cancer, diagnosed 29 years.agp   Mental illness Daughter        s/p craniotomies, gamma knife for tumors. numerous shunts.   Heart failure Mother    Cancer Brother        glioblastoma    Glaucoma Brother    Diverticulosis Sister    Cancer Sister        liver   Cancer Brother        melanoma, mets to lung and brain and intestine   Alcohol abuse Brother    Cancer Brother        liver   Glaucoma Maternal Grandfather    Cancer Sister        breast   Colon cancer Neg Hx    Hypertension Neg Hx       Review of Systems Shortness of breath with activity Nonproductive cough Sneezing Anxiety Feet swelling    Objective:    Physical Exam   Gen. Pleasant, obese, in no distress, normal affect ENT - no pallor,icterus, no post nasal drip, class 2 airway Neck: No JVD, no thyromegaly, no carotid bruits Lungs: no use of accessory muscles,  no dullness to percussion, decreased without rales or rhonchi  Cardiovascular: Rhythm regular, heart sounds  normal, no murmurs or gallops, 2+ peripheral edema Abdomen: soft and non-tender, no hepatosplenomegaly, BS normal. Musculoskeletal: No deformities, no cyanosis or clubbing Neuro:  alert, non focal, no tremors       Assessment & Plan:

## 2022-03-01 NOTE — Patient Instructions (Signed)
Pre/Post Spirometry Performed Today. 

## 2022-03-01 NOTE — Assessment & Plan Note (Signed)
Her worsening dyspnea and pedal edema with orthopnea seems to point towards acute on chronic diastolic heart failure as the most likely etiology.  I have asked her to increase Lasix to 40 mg daily and aim for weight loss of at least 5 pounds which would be around 175 pounds for her.  She can then resume daily dose of 20 mg of Lasix. She should restrict salt in her diet. Echocardiogram is pending

## 2022-03-01 NOTE — Assessment & Plan Note (Signed)
Spirometry today shows minimal airway obstruction with ratio 71, FVC 71 and FEV1 70%, Will treat for diastolic heart failure first and if persistent dyspnea, then consider adding bronchodilators. Meanwhile continue albuterol on a as needed basis

## 2022-03-23 ENCOUNTER — Other Ambulatory Visit: Payer: Self-pay | Admitting: Family Medicine

## 2022-04-12 ENCOUNTER — Other Ambulatory Visit: Payer: Self-pay | Admitting: Family Medicine

## 2022-04-12 DIAGNOSIS — I509 Heart failure, unspecified: Secondary | ICD-10-CM

## 2022-04-16 IMAGING — DX DG CHEST 2V
2 series · 2 of 2 positions shown · non-contrast
Comparison: 11/08/2016

CLINICAL DATA: Possible volume overload

EXAM:
CHEST - 2 VIEW

[chest pa]
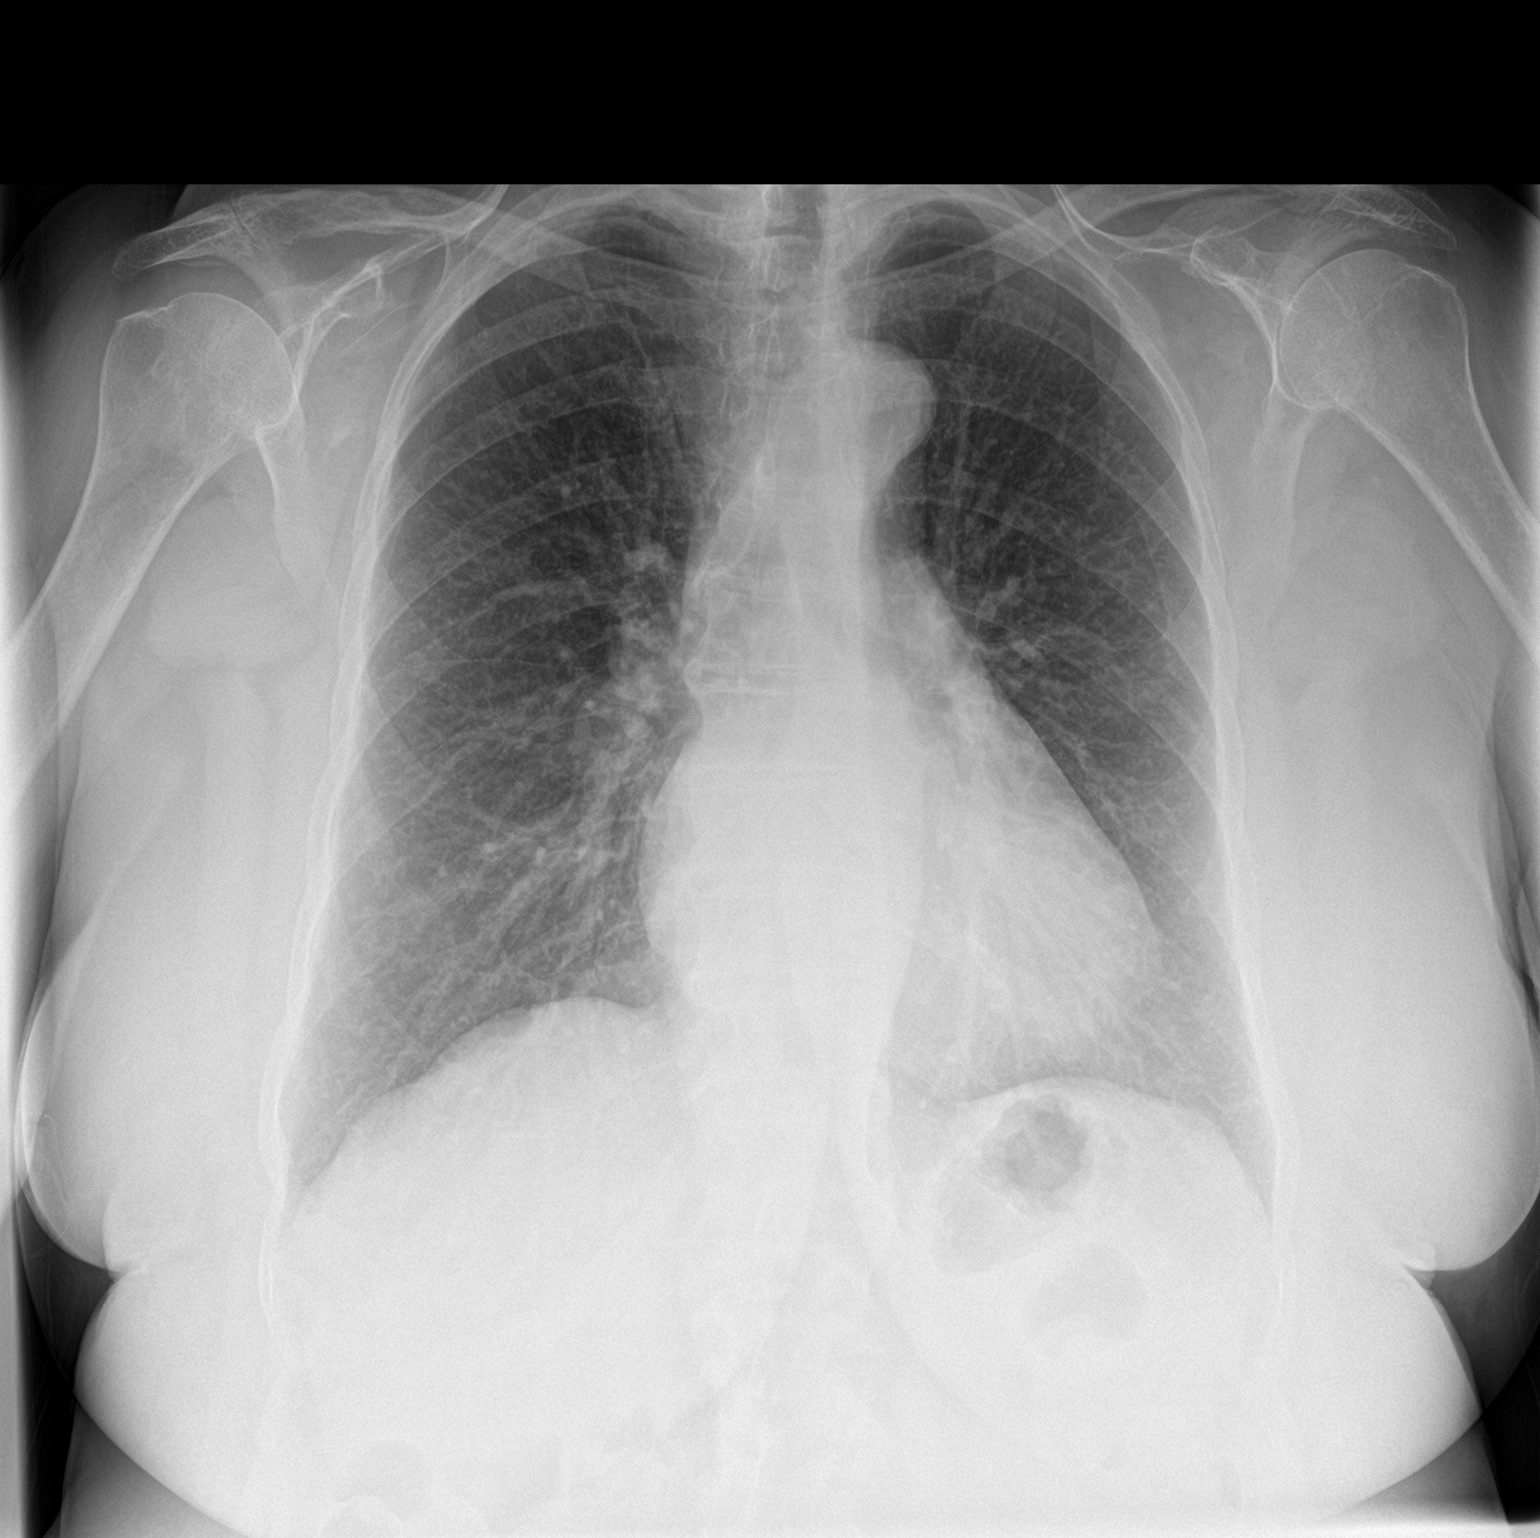

[chest lat]
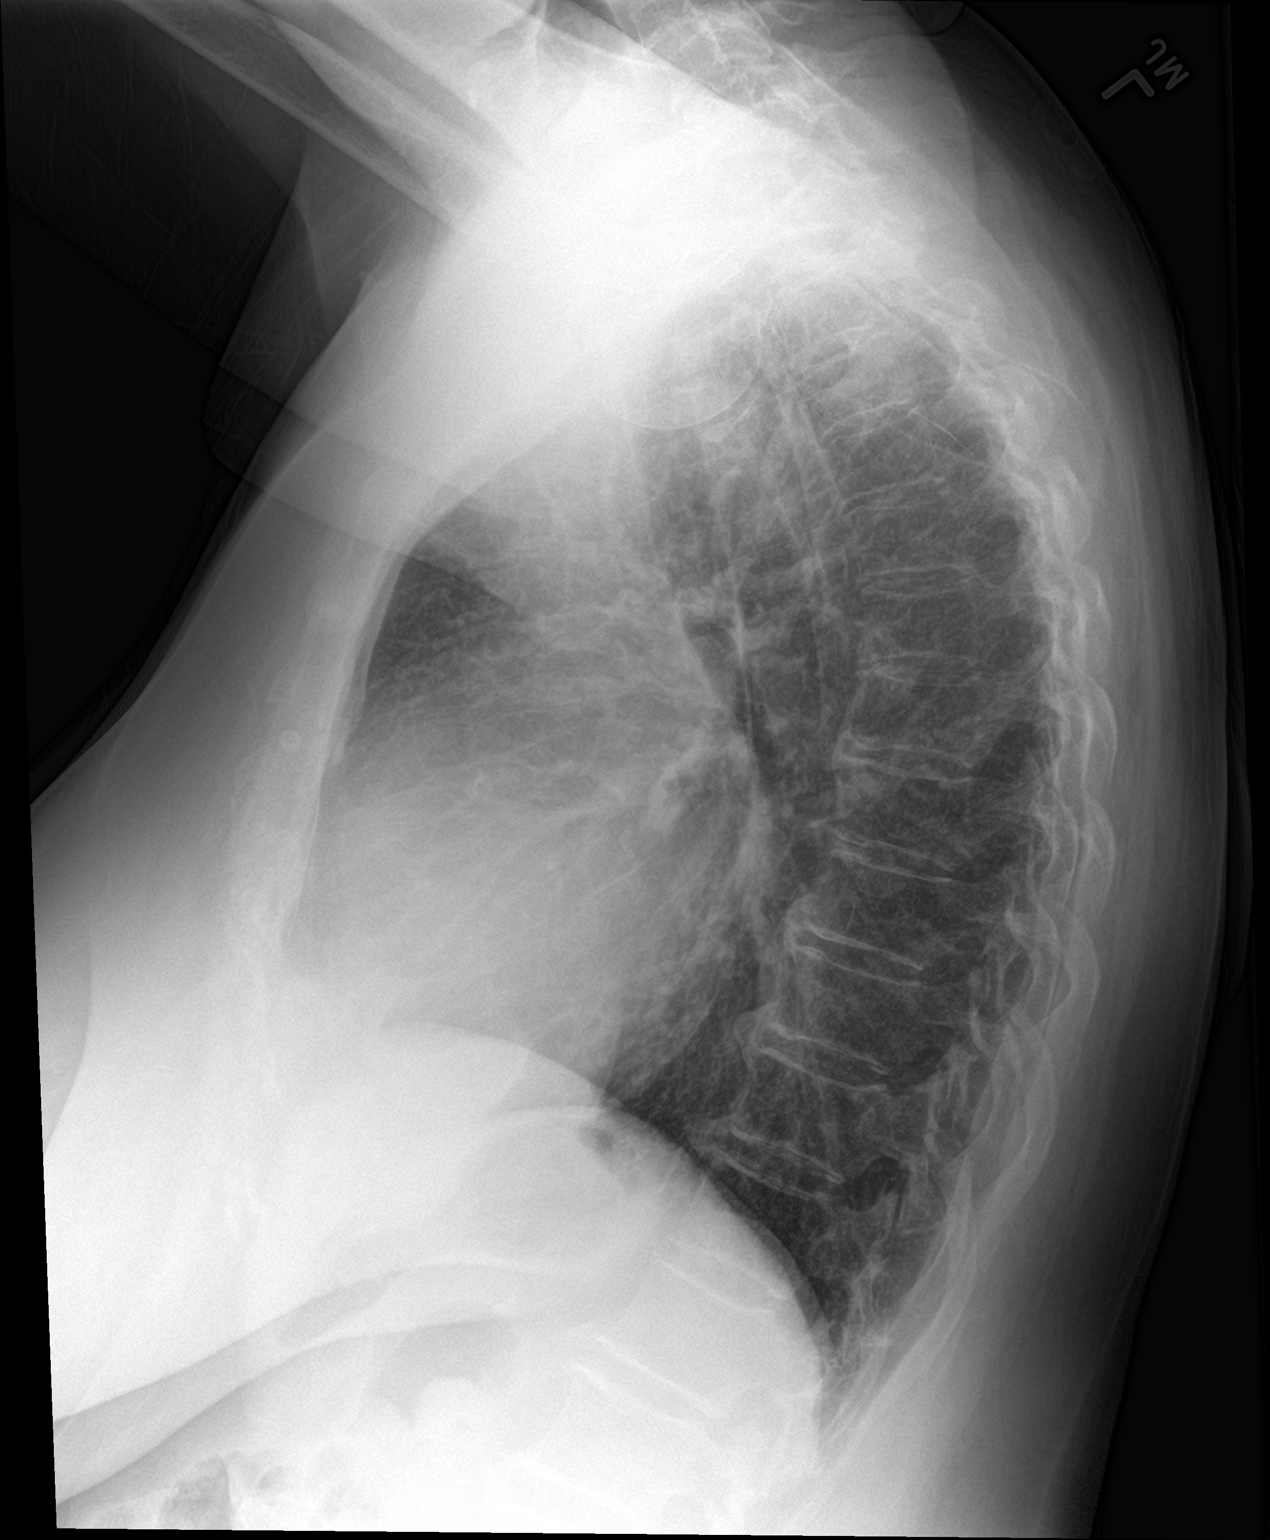

[2 of 2 positions shown; findings below may reference images not displayed]

FINDINGS: Heart and mediastinal contours are within normal limits. No focal
opacities or effusions. No acute bony abnormality.
IMPRESSION: No active cardiopulmonary disease.

## 2022-04-20 ENCOUNTER — Other Ambulatory Visit: Payer: Self-pay | Admitting: Family Medicine

## 2022-04-20 ENCOUNTER — Telehealth: Payer: Self-pay | Admitting: Family Medicine

## 2022-04-20 MED ORDER — ALPRAZOLAM 0.25 MG PO TABS: 0.2500 mg | ORAL_TABLET | Freq: Every day | ORAL | 2 refills | Status: DC | PRN

## 2022-04-20 NOTE — Telephone Encounter (Signed)
Medication: ALPRAZolam (XANAX) 0.25 MG tablet  Has the patient contacted their pharmacy? No.   Preferred Pharmacy:  [facility], [place] [address], [place] [address] Phone: [phone]  Fax: [phone]

## 2022-04-21 ENCOUNTER — Other Ambulatory Visit: Payer: Self-pay | Admitting: Family Medicine

## 2022-05-25 ENCOUNTER — Other Ambulatory Visit: Payer: Self-pay | Admitting: Cardiology

## 2022-05-25 DIAGNOSIS — E7800 Pure hypercholesterolemia, unspecified: Secondary | ICD-10-CM

## 2022-06-03 ENCOUNTER — Ambulatory Visit (HOSPITAL_BASED_OUTPATIENT_CLINIC_OR_DEPARTMENT_OTHER): Payer: Medicare Other | Admitting: Pulmonary Disease

## 2022-06-21 ENCOUNTER — Other Ambulatory Visit: Payer: Self-pay | Admitting: Family Medicine

## 2022-06-25 ENCOUNTER — Telehealth: Payer: Self-pay | Admitting: Family Medicine

## 2022-06-25 ENCOUNTER — Other Ambulatory Visit: Payer: Self-pay

## 2022-06-25 DIAGNOSIS — I509 Heart failure, unspecified: Secondary | ICD-10-CM

## 2022-06-25 MED ORDER — FUROSEMIDE 20 MG PO TABS: 20.0000 mg | ORAL_TABLET | Freq: Every day | ORAL | 1 refills | Status: DC

## 2022-06-25 NOTE — Telephone Encounter (Signed)
Medication sent.

## 2022-06-25 NOTE — Telephone Encounter (Signed)
Prescription Request  06/25/2022  Is this a "Controlled Substance" medicine? No  LOV: 02/09/2022  What is the name of the medication or equipment?   furosemide (LASIX) 20 MG tablet  Have you contacted your pharmacy to request a refill? Yes   Which pharmacy would you like this sent to?   [facility] [address] [place], [place] - [address] [place] [address] Phone: [phone] Fax: [phone]    Patient notified that their request is being sent to the clinical staff for review and that they should receive a response within 2 business days.   Please advise at [facility] [phone]

## 2022-06-28 ENCOUNTER — Other Ambulatory Visit: Payer: Self-pay

## 2022-06-28 NOTE — Progress Notes (Signed)
HPI: FU atrial fibrillation. Nuclear study May 2014 showed breast attenuation but no ischemia. Ejection fraction 79%. Noted to be in new onset atrial 9/18. Seen in atrial fibrillation clinic; treated with rate control and anticoagulation. TSH normal. Echocardiogram May 2022 showed ejection fraction 55 to 60%, grade 2 diastolic dysfunction, mild right atrial enlargement, mild right ventricular enlargement, mild mitral regurgitation. Patient seen by pulmonary in January and Lasix increased for acute on chronic diastolic congestive heart failure.  Since last seen, patient notes some increased wheezing and dyspnea on activities.  She denies orthopnea, PND, chest pain, palpitations or syncope.  She has chronic pedal edema that she feels is unchanged.  There is been no bleeding.  Current Outpatient Medications  Medication Sig Dispense Refill   acetaminophen (TYLENOL) 325 MG tablet Take 2 tablets (650 mg total) by mouth every 6 (six) hours as needed for mild pain (or Fever >/= 101).     albuterol (VENTOLIN HFA) 108 (90 Base) MCG/ACT inhaler Inhale 1 puff into the lungs every 6 (six) hours as needed for wheezing or shortness of breath. 30 each 2   ALPRAZolam (XANAX) 0.25 MG tablet Take 1 tablet (0.25 mg total) by mouth daily as needed for anxiety. 30 tablet 2   apixaban (ELIQUIS) 5 MG TABS tablet Take 1 tablet (5 mg total) by mouth 2 (two) times daily. Please keep upcoming appointment. Thanks. 60 tablet 1   atorvastatin (LIPITOR) 40 MG tablet TAKE 1 TABLET BY MOUTH ONCE DAILY . APPOINTMENT REQUIRED FOR FUTURE REFILLS 30 tablet 0   Azelastine HCl 137 MCG/SPRAY SOLN Place 1 spray into both nostrils daily.     budesonide-formoterol (SYMBICORT) 160-4.5 MCG/ACT inhaler Inhale 2 puffs into the lungs daily.     citalopram (CELEXA) 20 MG tablet Take 1 tablet by mouth once daily 90 tablet 0   Cranberry 500 MG CAPS Take 500 mg by mouth daily.     furosemide (LASIX) 20 MG tablet Take 1 tablet (20 mg total) by  mouth daily. 90 tablet 1   levothyroxine (SYNTHROID) 75 MCG tablet TAKE 1 TABLET BY MOUTH ONCE DAILY BEFORE BREAKFAST 90 tablet 0   loratadine (CLARITIN) 10 MG tablet Take 10 mg by mouth daily.     Multiple Vitamins-Minerals (CENTRUM SILVER PO) Take 1 tablet by mouth daily.     predniSONE (DELTASONE) 20 MG tablet Take 20 mg by mouth 2 (two) times daily.     promethazine-dextromethorphan (PROMETHAZINE-DM) 6.25-15 MG/5ML syrup Take 5 mLs by mouth 4 (four) times daily as needed.     verapamil (CALAN-SR) 240 MG CR tablet Take 1 tablet (240 mg total) by mouth daily. 90 tablet 1   levofloxacin (LEVAQUIN) 250 MG tablet Take 1 tablet (250 mg total) by mouth every other day. (Patient not taking: Reported on 07/07/2022) 3 tablet 0   No current facility-administered medications for this visit.     Past Medical History:  Diagnosis Date   Allergic rhinitis    Anemia    Anxiety    Arthritis    bilateral knee   Atrial fibrillation ([facility]) 12/21/2016   Cervical cancer screening 10/30/2013   Menarche at 15 Regular and moderate flow No history of abnormal pap in past G4P3, s/p 3 svd and 1 MC No history of abnormal MGM Noconcerns today No gyn surgeries   Constipation 11/18/2015   Depression with anxiety    Diverticulitis    Heart murmur    Hyperlipidemia    Hypertension    Hypothyroid  Left knee DJD    Medicare annual wellness visit, subsequent 10/30/2013   PONV (postoperative nausea and vomiting)    Preventative health care 05/20/2016    Past Surgical History:  Procedure Laterality Date   ABDOMINAL HYSTERECTOMY     bladder tack  04/2010   Dr Marciano Sequin   ERCP N/A 07/16/2020   Procedure: ENDOSCOPIC RETROGRADE CHOLANGIOPANCREATOGRAPHY (ERCP);  Surgeon: Kerin Salen, MD;  Location: [facility];  Service: Gastroenterology;  Laterality: N/A;   GANGLION CYST EXCISION     JOINT REPLACEMENT Right    REMOVAL OF STONES  07/16/2020   Procedure: REMOVAL OF STONES;  Surgeon: Kerin Salen, MD;  Location: [facility];  Service: Gastroenterology;;   REPLACEMENT TOTAL KNEE  10/2010   right knee-Murphy    SPHINCTEROTOMY  07/16/2020   Procedure: SPHINCTEROTOMY;  Surgeon: Kerin Salen, MD;  Location: [facility];  Service: Gastroenterology;;   TONSILLECTOMY     TOTAL KNEE ARTHROPLASTY Left 07/26/2012   Procedure: TOTAL KNEE ARTHROPLASTY;  Surgeon: Loreta Ave, MD;  Location: [facility];  Service: Orthopedics;  Laterality: Left;    Social History   Socioeconomic History   Marital status: Widowed    Spouse name: Not on file   Number of children: 3   Years of education: Not on file   Highest education level: Not on file  Occupational History   Not on file  Tobacco Use   Smoking status: Never   Smokeless tobacco: Never  Vaping Use   Vaping Use: Never used  Substance and Sexual Activity   Alcohol use: No   Drug use: No   Sexual activity: Not on file  Other Topics Concern   Not on file  Social History Narrative   Not on file   Social Determinants of Health   Financial Resource Strain: Low Risk  (02/11/2022)   Overall Financial Resource Strain (CARDIA)    Difficulty of Paying Living Expenses: Not hard at all  Food Insecurity: No Food Insecurity (02/11/2022)   Hunger Vital Sign    Worried About Running Out of Food in the Last Year: Never true    Ran Out of Food in the Last Year: Never true  Transportation Needs: No Transportation Needs (02/11/2022)   PRAPARE - Administrator, Civil Service (Medical): No    Lack of Transportation (Non-Medical): No  Physical Activity: Inactive (02/11/2022)   Exercise Vital Sign    Days of Exercise per Week: 0 days    Minutes of Exercise per Session: 0 min  Stress: No Stress Concern Present (02/11/2022)   [employer] of Occupational Health - Occupational Stress Questionnaire    Feeling of Stress : Not at all  Social Connections: Moderately Integrated (02/11/2022)   Social Connection and Isolation Panel [NHANES]    Frequency of  Communication with Friends and Family: More than three times a week    Frequency of Social Gatherings with Friends and Family: Once a week    Attends Religious Services: More than 4 times per year    Active Member of [employer] or Organizations: Yes    Attends Banker Meetings: More than 4 times per year    Marital Status: Widowed  Intimate Partner Violence: Not At Risk (02/11/2022)   Humiliation, Afraid, Rape, and Kick questionnaire    Fear of Current or Ex-Partner: No    Emotionally Abused: No    Physically Abused: No    Sexually Abused: No    Family History  Problem Relation Age  of Onset   Alcohol abuse Father    Breast cancer Sister    Cancer Sister        bladder   Glaucoma Sister    Arthritis Daughter    Diabetes Daughter        type 2   Cancer Daughter        brain cancer, diagnosed 29 years.agp   Mental illness Daughter        s/p craniotomies, gamma knife for tumors. numerous shunts.   Heart failure Mother    Cancer Brother        glioblastoma    Glaucoma Brother    Diverticulosis Sister    Cancer Sister        liver   Cancer Brother        melanoma, mets to lung and brain and intestine   Alcohol abuse Brother    Cancer Brother        liver   Glaucoma Maternal Grandfather    Cancer Sister        breast   Colon cancer Neg Hx    Hypertension Neg Hx     ROS: no fevers or chills, productive cough, hemoptysis, dysphasia, odynophagia, melena, hematochezia, dysuria, hematuria, rash, seizure activity, orthopnea, PND, claudication. Remaining systems are negative.  Physical Exam: Well-developed well-nourished in no acute distress.  Skin is warm and dry.  HEENT is normal.  Neck is supple.  Chest is clear to auscultation with normal expansion.  Cardiovascular exam is irregular Abdominal exam nontender or distended. No masses palpated. Extremities show 1+ edema. neuro grossly intact  ECG-atrial fibrillation at a rate of 92, normal axis, right bundle  branch block, nonspecific ST changes.  Personally reviewed  A/P  1 paroxysmal atrial fibrillation-patient has developed recurrent atrial fibrillation.  This certainly could be contributing to her dyspnea/wheezing.  Continue verapamil for rate control.  Continue apixaban.  We discussed options of rate control versus rhythm control today.  She would like to be conservative if possible.  We will treat with Lasix 40 mg daily for her dyspnea/edema.  Check potassium and renal function in 1 week.  If her symptoms improve with this rate control might be best option long-term.  If not we will consider attempt at cardioversion.  Would likely need antiarrhythmic to maintain sinus rhythm.  I will see her back in 6 to 8 weeks to make sure she is stable.  2 hypertension-patient's blood pressure is controlled.  Continue present medical regimen.  3 hyperlipidemia-continue statin.  4 chronic diastolic congestive heart failure-she is mildly volume overloaded on examination today.  Increase Lasix from 20 to 40 mg daily.  Check potassium and renal function in 1 week.  [name], MD

## 2022-07-03 ENCOUNTER — Other Ambulatory Visit: Payer: Self-pay | Admitting: Cardiology

## 2022-07-03 DIAGNOSIS — E7800 Pure hypercholesterolemia, unspecified: Secondary | ICD-10-CM

## 2022-07-03 DIAGNOSIS — I4891 Unspecified atrial fibrillation: Secondary | ICD-10-CM

## 2022-07-05 NOTE — Telephone Encounter (Signed)
Eliquis 5mg  refill request received. Patient is 89 years old, weight-82.6kg, Crea-1.06 on 02/09/22, Diagnosis-Afib, and last seen by Dr. Jens Som on 02/04/21 and has an appt pending on 07/07/22 with Dr. Jens Som. Dose is appropriate based on dosing criteria. Will send in refill to requested pharmacy.

## 2022-07-07 ENCOUNTER — Encounter: Payer: Self-pay | Admitting: Cardiology

## 2022-07-07 ENCOUNTER — Ambulatory Visit: Payer: Medicare Other | Attending: Cardiology | Admitting: Cardiology

## 2022-07-07 VITALS — BP 128/64 | HR 96 | Ht 62.0 in | Wt 186.1 lb

## 2022-07-07 DIAGNOSIS — I509 Heart failure, unspecified: Secondary | ICD-10-CM

## 2022-07-07 DIAGNOSIS — I4819 Other persistent atrial fibrillation: Secondary | ICD-10-CM

## 2022-07-07 DIAGNOSIS — I10 Essential (primary) hypertension: Secondary | ICD-10-CM

## 2022-07-07 DIAGNOSIS — E7800 Pure hypercholesterolemia, unspecified: Secondary | ICD-10-CM | POA: Diagnosis not present

## 2022-07-07 MED ORDER — FUROSEMIDE 20 MG PO TABS
40.0000 mg | ORAL_TABLET | Freq: Every day | ORAL | 3 refills | Status: DC
Start: 2022-07-07 — End: 2023-05-25

## 2022-07-07 NOTE — Patient Instructions (Signed)
Medication Instructions:   INCREASE FUROSEMIDE TO 40 MG ONCE DAILY= 2 OF THE 20 MG TABLETS ONCE DAILY  *If you need a refill on your cardiac medications before your next appointment, please call your pharmacy*   Lab Work:  Your physician recommends that you return for lab work in: ONE WEEK-DO NOT NEED TO FAST  [employer] [facility]  Located on the 3 rd floor in [address] Hours-Monday - Friday 8 am-11:30 AM and 1 pm -4 pm   If you have labs (blood work) drawn today and your tests are completely normal, you will receive your results only by: MyChart Message (if you have MyChart) OR A paper copy in the mail If you have any lab test that is abnormal or we need to change your treatment, we will call you to review the results.   Follow-Up: At [facility], you and your health needs are our priority.  As part of our continuing mission to provide you with exceptional heart care, we have created designated Provider Care Teams.  These Care Teams include your primary Cardiologist (physician) and Advanced Practice Providers (APPs -  Physician Assistants and Nurse Practitioners) who all work together to provide you with the care you need, when you need it.  We recommend signing up for the patient portal called "MyChart".  Sign up information is provided on this After Visit Summary.  MyChart is used to connect with patients for Virtual Visits (Telemedicine).  Patients are able to view lab/test results, encounter notes, upcoming appointments, etc.  Non-urgent messages can be sent to your provider as well.   To learn more about what you can do with MyChart, go to [email or web address].    Your next appointment:   8 week(s)  Provider:   [name], MD

## 2022-07-16 ENCOUNTER — Encounter: Payer: Self-pay | Admitting: *Deleted

## 2022-07-16 LAB — BASIC METABOLIC PANEL
BUN/Creatinine Ratio: 15 (ref 12–28)
BUN: 18 mg/dL (ref 8–27)
CO2: 23 mmol/L (ref 20–29)
Calcium: 8.6 mg/dL — ABNORMAL LOW (ref 8.7–10.3)
Chloride: 99 mmol/L (ref 96–106)
Creatinine, Ser: 1.23 mg/dL — ABNORMAL HIGH (ref 0.57–1.00)
Glucose: 126 mg/dL — ABNORMAL HIGH (ref 70–99)
Potassium: 4.7 mmol/L (ref 3.5–5.2)
Sodium: 140 mmol/L (ref 134–144)
eGFR: 42 mL/min/{1.73_m2} — ABNORMAL LOW (ref >59)

## 2022-07-20 ENCOUNTER — Other Ambulatory Visit: Payer: Self-pay | Admitting: Family Medicine

## 2022-07-20 ENCOUNTER — Telehealth: Payer: Self-pay | Admitting: Family Medicine

## 2022-07-20 NOTE — Telephone Encounter (Signed)
Prescription Request  07/20/2022  Is this a "Controlled Substance" medicine? Yes  LOV: 02/09/2022  What is the name of the medication or equipment?  citalopram (CELEXA) 20 MG tablet   Have you contacted your pharmacy to request a refill? Yes   Which pharmacy would you like this sent to? [facility] [address] [place], [place] - [address] [place] [address] Phone: [phone] Fax: [phone]    Patient notified that their request is being sent to the clinical staff for review and that they should receive a response within 2 business days.   Please advise at [facility] [phone]

## 2022-07-20 NOTE — Telephone Encounter (Signed)
Refill has been sent.  °

## 2022-07-26 ENCOUNTER — Other Ambulatory Visit: Payer: Self-pay | Admitting: Family Medicine

## 2022-07-30 ENCOUNTER — Other Ambulatory Visit: Payer: Self-pay | Admitting: Family Medicine

## 2022-07-30 ENCOUNTER — Telehealth: Payer: Self-pay | Admitting: Family Medicine

## 2022-07-30 MED ORDER — ALPRAZOLAM 0.25 MG PO TABS: 0.2500 mg | ORAL_TABLET | Freq: Every day | ORAL | 2 refills | Status: DC | PRN

## 2022-07-30 NOTE — Telephone Encounter (Signed)
Pt has appt coming up 08/12/22

## 2022-07-30 NOTE — Telephone Encounter (Signed)
Pt called for a refill request.    ALPRAZolam [name] 0.25 MG tablet   Pharmacy  [facility] [address] [place], [place] - [address]

## 2022-08-02 ENCOUNTER — Other Ambulatory Visit: Payer: Self-pay | Admitting: Cardiology

## 2022-08-02 DIAGNOSIS — E7800 Pure hypercholesterolemia, unspecified: Secondary | ICD-10-CM

## 2022-08-11 NOTE — Assessment & Plan Note (Signed)
hgba1c acceptable, minimize simple carbs. Increase exercise as tolerated.  

## 2022-08-11 NOTE — Assessment & Plan Note (Signed)
Rate controlled tolerating Eliquis.  

## 2022-08-11 NOTE — Assessment & Plan Note (Signed)
Well controlled, no changes to meds. Encouraged heart healthy diet such as the DASH diet and exercise as tolerated.  °

## 2022-08-11 NOTE — Assessment & Plan Note (Signed)
Hydrate and monitor 

## 2022-08-11 NOTE — Assessment & Plan Note (Signed)
Encourage heart healthy diet such as MIND or DASH diet, increase exercise, avoid trans fats, simple carbohydrates and processed foods, consider a krill or fish or flaxseed oil cap daily.  °

## 2022-08-11 NOTE — Assessment & Plan Note (Signed)
On Levothyroxine, continue to monitor 

## 2022-08-12 ENCOUNTER — Ambulatory Visit (INDEPENDENT_AMBULATORY_CARE_PROVIDER_SITE_OTHER): Payer: Medicare Other | Admitting: Family Medicine

## 2022-08-12 ENCOUNTER — Encounter: Payer: Self-pay | Admitting: Family Medicine

## 2022-08-12 VITALS — BP 105/64 | HR 30 | Temp 97.5°F | Resp 16 | Ht 63.0 in | Wt 181.2 lb

## 2022-08-12 DIAGNOSIS — I4891 Unspecified atrial fibrillation: Secondary | ICD-10-CM

## 2022-08-12 DIAGNOSIS — M549 Dorsalgia, unspecified: Secondary | ICD-10-CM

## 2022-08-12 DIAGNOSIS — E7800 Pure hypercholesterolemia, unspecified: Secondary | ICD-10-CM

## 2022-08-12 DIAGNOSIS — Z79899 Other long term (current) drug therapy: Secondary | ICD-10-CM

## 2022-08-12 DIAGNOSIS — R739 Hyperglycemia, unspecified: Secondary | ICD-10-CM | POA: Diagnosis not present

## 2022-08-12 DIAGNOSIS — E039 Hypothyroidism, unspecified: Secondary | ICD-10-CM | POA: Diagnosis not present

## 2022-08-12 DIAGNOSIS — N189 Chronic kidney disease, unspecified: Secondary | ICD-10-CM

## 2022-08-12 DIAGNOSIS — L989 Disorder of the skin and subcutaneous tissue, unspecified: Secondary | ICD-10-CM

## 2022-08-12 DIAGNOSIS — I10 Essential (primary) hypertension: Secondary | ICD-10-CM

## 2022-08-12 LAB — COMPREHENSIVE METABOLIC PANEL
ALT: 25 U/L (ref 0–35)
AST: 23 U/L (ref 0–37)
Albumin: 3.8 g/dL (ref 3.5–5.2)
Alkaline Phosphatase: 88 U/L (ref 39–117)
BUN: 24 mg/dL — ABNORMAL HIGH (ref 6–23)
CO2: 28 mEq/L (ref 19–32)
Calcium: 9.1 mg/dL (ref 8.4–10.5)
Chloride: 101 mEq/L (ref 96–112)
Creatinine, Ser: 1.19 mg/dL (ref 0.40–1.20)
GFR: 40.57 mL/min — ABNORMAL LOW (ref >60.00)
Glucose, Bld: 92 mg/dL (ref 70–99)
Potassium: 4.2 mEq/L (ref 3.5–5.1)
Sodium: 141 mEq/L (ref 135–145)
Total Bilirubin: 1.6 mg/dL — ABNORMAL HIGH (ref 0.2–1.2)
Total Protein: 6.8 g/dL (ref 6.0–8.3)

## 2022-08-12 LAB — LIPID PANEL
Cholesterol: 138 mg/dL (ref 0–200)
HDL: 57.3 mg/dL (ref >39.00)
LDL Cholesterol: 64 mg/dL (ref 0–99)
NonHDL: 80.27
Total CHOL/HDL Ratio: 2
Triglycerides: 81 mg/dL (ref 0.0–149.0)
VLDL: 16.2 mg/dL (ref 0.0–40.0)

## 2022-08-12 LAB — CBC WITH DIFFERENTIAL/PLATELET
Basophils Absolute: 0.1 10*3/uL (ref 0.0–0.1)
Basophils Relative: 0.9 % (ref 0.0–3.0)
Eosinophils Absolute: 0.1 10*3/uL (ref 0.0–0.7)
Eosinophils Relative: 1.6 % (ref 0.0–5.0)
HCT: 42.6 % (ref 36.0–46.0)
Hemoglobin: 14 g/dL (ref 12.0–15.0)
Lymphocytes Relative: 13.5 % (ref 12.0–46.0)
Lymphs Abs: 1.2 10*3/uL (ref 0.7–4.0)
MCHC: 32.8 g/dL (ref 30.0–36.0)
MCV: 98.2 fl (ref 78.0–100.0)
Monocytes Absolute: 0.8 10*3/uL (ref 0.1–1.0)
Monocytes Relative: 9.3 % (ref 3.0–12.0)
Neutro Abs: 6.6 10*3/uL (ref 1.4–7.7)
Neutrophils Relative %: 74.7 % (ref 43.0–77.0)
Platelets: 204 10*3/uL (ref 150.0–400.0)
RBC: 4.33 Mil/uL (ref 3.87–5.11)
RDW: 14.6 % (ref 11.5–15.5)
WBC: 8.8 10*3/uL (ref 4.0–10.5)

## 2022-08-12 LAB — TSH: TSH: 0.87 u[IU]/mL (ref 0.35–5.50)

## 2022-08-12 LAB — HEMOGLOBIN A1C: Hgb A1c MFr Bld: 6.3 % (ref 4.6–6.5)

## 2022-08-12 MED ORDER — BUDESONIDE-FORMOTEROL FUMARATE 160-4.5 MCG/ACT IN AERO: 2.0000 | INHALATION_SPRAY | Freq: Every day | RESPIRATORY_TRACT | 5 refills | Status: DC

## 2022-08-12 MED ORDER — VERAPAMIL HCL ER 240 MG PO TBCR: 240.0000 mg | EXTENDED_RELEASE_TABLET | Freq: Every day | ORAL | 1 refills | Status: DC

## 2022-08-12 MED ORDER — AZELASTINE HCL 137 MCG/SPRAY NA SOLN: 1.0000 | Freq: Every day | NASAL | 5 refills | Status: DC

## 2022-08-12 NOTE — Progress Notes (Signed)
Subjective:    Patient ID: Alyssa Williamson, female    DOB: 24-Jul-1931, 89 y.o.   MRN: [id number]  Chief Complaint  Patient presents with   Follow-up    Follow up    HPI Discussed the use of AI scribe software for clinical note transcription with the patient, who gave verbal consent to proceed.  History of Present Illness   The patient, with a history of respiratory issues, presents with ongoing concerns about their respiratory health. They report an improvement in wheezing after being on two water pills. However, they still experience occasional episodes of mucus production and wheezing, which they describe as being worse with humidity. The patient also mentions a change in urine odor and frequency, which has been present for an unspecified duration. They deny any associated pain, blood in urine, or changes in color. The patient also brings up a skin lesion that has been present for about a week. They express concern about the lesion due to its irregular appearance and request a referral to a dermatologist.     Denies CP/palp/SOB/HA/congestion/fevers/GI or GU c/o. Taking meds as prescribed    Past Medical History:  Diagnosis Date   Allergic rhinitis    Anemia    Anxiety    Arthritis    bilateral knee   Atrial fibrillation (HCC) 12/21/2016   Cervical cancer screening 10/30/2013   Menarche at 15 Regular and moderate flow No history of abnormal pap in past G4P3, s/p 3 svd and 1 MC No history of abnormal MGM Noconcerns today No gyn surgeries   Constipation 11/18/2015   Depression with anxiety    Diverticulitis    Heart murmur    Hyperlipidemia    Hypertension    Hypothyroid    Left knee DJD    Medicare annual wellness visit, subsequent 10/30/2013   PONV (postoperative nausea and vomiting)    Preventative health care 05/20/2016    Past Surgical History:  Procedure Laterality Date   ABDOMINAL HYSTERECTOMY     bladder tack  04/2010   Dr Marciano Sequin   ERCP N/A 07/16/2020    Procedure: ENDOSCOPIC RETROGRADE CHOLANGIOPANCREATOGRAPHY (ERCP);  Surgeon: Kerin Salen, MD;  Location: [facility];  Service: Gastroenterology;  Laterality: N/A;   GANGLION CYST EXCISION     JOINT REPLACEMENT Right    REMOVAL OF STONES  07/16/2020   Procedure: REMOVAL OF STONES;  Surgeon: Kerin Salen, MD;  Location: [facility];  Service: Gastroenterology;;   REPLACEMENT TOTAL KNEE  10/2010   right knee-Murphy    SPHINCTEROTOMY  07/16/2020   Procedure: SPHINCTEROTOMY;  Surgeon: Kerin Salen, MD;  Location: [facility];  Service: Gastroenterology;;   TONSILLECTOMY     TOTAL KNEE ARTHROPLASTY Left 07/26/2012   Procedure: TOTAL KNEE ARTHROPLASTY;  Surgeon: Loreta Ave, MD;  Location: [facility];  Service: Orthopedics;  Laterality: Left;    Family History  Problem Relation Age of Onset   Alcohol abuse Father    Breast cancer Sister    Cancer Sister        bladder   Glaucoma Sister    Arthritis Daughter    Diabetes Daughter        type 2   Cancer Daughter        brain cancer, diagnosed 29 years.agp   Mental illness Daughter        s/p craniotomies, gamma knife for tumors. numerous shunts.   Heart failure Mother    Cancer Brother        glioblastoma  Glaucoma Brother    Diverticulosis Sister    Cancer Sister        liver   Cancer Brother        melanoma, mets to lung and brain and intestine   Alcohol abuse Brother    Cancer Brother        liver   Glaucoma Maternal Grandfather    Cancer Sister        breast   Colon cancer Neg Hx    Hypertension Neg Hx     Social History   Socioeconomic History   Marital status: Widowed    Spouse name: Not on file   Number of children: 3   Years of education: Not on file   Highest education level: Not on file  Occupational History   Not on file  Tobacco Use   Smoking status: Never   Smokeless tobacco: Never  Vaping Use   Vaping Use: Never used  Substance and Sexual Activity   Alcohol use: No   Drug use: No   Sexual activity:  Not on file  Other Topics Concern   Not on file  Social History Narrative   Not on file   Social Determinants of Health   Financial Resource Strain: Low Risk  (02/11/2022)   Overall Financial Resource Strain (CARDIA)    Difficulty of Paying Living Expenses: Not hard at all  Food Insecurity: No Food Insecurity (02/11/2022)   Hunger Vital Sign    Worried About Running Out of Food in the Last Year: Never true    Ran Out of Food in the Last Year: Never true  Transportation Needs: No Transportation Needs (02/11/2022)   PRAPARE - Administrator, Civil Service (Medical): No    Lack of Transportation (Non-Medical): No  Physical Activity: Inactive (02/11/2022)   Exercise Vital Sign    Days of Exercise per Week: 0 days    Minutes of Exercise per Session: 0 min  Stress: No Stress Concern Present (02/11/2022)   [employer] of Occupational Health - Occupational Stress Questionnaire    Feeling of Stress : Not at all  Social Connections: Moderately Integrated (02/11/2022)   Social Connection and Isolation Panel [NHANES]    Frequency of Communication with Friends and Family: More than three times a week    Frequency of Social Gatherings with Friends and Family: Once a week    Attends Religious Services: More than 4 times per year    Active Member of [employer] or Organizations: Yes    Attends Banker Meetings: More than 4 times per year    Marital Status: Widowed  Intimate Partner Violence: Not At Risk (02/11/2022)   Humiliation, Afraid, Rape, and Kick questionnaire    Fear of Current or Ex-Partner: No    Emotionally Abused: No    Physically Abused: No    Sexually Abused: No    Outpatient Medications Prior to Visit  Medication Sig Dispense Refill   acetaminophen (TYLENOL) 325 MG tablet Take 2 tablets (650 mg total) by mouth every 6 (six) hours as needed for mild pain (or Fever >/= 101).     albuterol (VENTOLIN HFA) 108 (90 Base) MCG/ACT inhaler Inhale 1 puff into  the lungs every 6 (six) hours as needed for wheezing or shortness of breath. 30 each 2   ALPRAZolam (XANAX) 0.25 MG tablet Take 1 tablet (0.25 mg total) by mouth daily as needed for anxiety. 30 tablet 2   apixaban (ELIQUIS) 5 MG TABS tablet Take 1  tablet (5 mg total) by mouth 2 (two) times daily. Please keep upcoming appointment. Thanks. 60 tablet 1   atorvastatin (LIPITOR) 40 MG tablet TAKE 1 TABLET BY MOUTH ONCE DAILY APPT  NEEDED  FOR  FUTURE  REFILLS 90 tablet 3   citalopram (CELEXA) 20 MG tablet Take 1 tablet by mouth once daily 90 tablet 0   Cranberry 500 MG CAPS Take 500 mg by mouth daily.     furosemide (LASIX) 20 MG tablet Take 2 tablets (40 mg total) by mouth daily. 180 tablet 3   levothyroxine (SYNTHROID) 75 MCG tablet TAKE 1 TABLET BY MOUTH ONCE DAILY BEFORE BREAKFAST 90 tablet 0   loratadine (CLARITIN) 10 MG tablet Take 10 mg by mouth daily.     Multiple Vitamins-Minerals (CENTRUM SILVER PO) Take 1 tablet by mouth daily.     Azelastine HCl 137 MCG/SPRAY SOLN Place 1 spray into both nostrils daily.     budesonide-formoterol (SYMBICORT) 160-4.5 MCG/ACT inhaler Inhale 2 puffs into the lungs daily.     levofloxacin (LEVAQUIN) 250 MG tablet Take 1 tablet (250 mg total) by mouth every other day. (Patient not taking: Reported on 07/07/2022) 3 tablet 0   predniSONE (DELTASONE) 20 MG tablet Take 20 mg by mouth 2 (two) times daily. (Patient not taking: Reported on 08/12/2022)     promethazine-dextromethorphan (PROMETHAZINE-DM) 6.25-15 MG/5ML syrup Take 5 mLs by mouth 4 (four) times daily as needed. (Patient not taking: Reported on 08/12/2022)     verapamil (CALAN-SR) 240 MG CR tablet Take 1 tablet (240 mg total) by mouth daily. (Patient not taking: Reported on 08/12/2022) 90 tablet 1   No facility-administered medications prior to visit.    Allergies  Allergen Reactions   Cefdinir Dermatitis   Codeine Nausea And Vomiting    Review of Systems  Constitutional:  Negative for fever and  malaise/fatigue.  HENT:  Negative for congestion.   Eyes:  Negative for blurred vision.  Respiratory:  Negative for shortness of breath.   Cardiovascular:  Negative for chest pain, palpitations and leg swelling.  Gastrointestinal:  Negative for abdominal pain, blood in stool and nausea.  Genitourinary:  Negative for dysuria and frequency.  Musculoskeletal:  Negative for falls.  Skin:  Negative for rash.       Raised, light brown lesion inner upper right thigh  Neurological:  Negative for dizziness, loss of consciousness and headaches.  Endo/Heme/Allergies:  Negative for environmental allergies.  Psychiatric/Behavioral:  Negative for depression. The patient is not nervous/anxious.        Objective:    Physical Exam  BP 105/64 (BP Location: Left Arm, Patient Position: Sitting, Cuff Size: Normal)   Pulse (!) 30   Temp (!) 97.5 F (36.4 C) (Oral)   Resp 16   Ht 5\' 3"  (1.6 m)   Wt 181 lb 3.2 oz (82.2 kg)   SpO2 97%   BMI 32.10 kg/m  Wt Readings from Last 3 Encounters:  08/12/22 181 lb 3.2 oz (82.2 kg)  07/07/22 186 lb 1.3 oz (84.4 kg)  03/01/22 182 lb 3.2 oz (82.6 kg)    Diabetic Foot Exam - Simple   No data filed    Lab Results  Component Value Date   WBC 8.8 08/12/2022   HGB 14.0 08/12/2022   HCT 42.6 08/12/2022   PLT 204.0 08/12/2022   GLUCOSE 92 08/12/2022   CHOL 138 08/12/2022   TRIG 81.0 08/12/2022   HDL 57.30 08/12/2022   LDLCALC 64 08/12/2022   ALT 25 08/12/2022  AST 23 08/12/2022   NA 141 08/12/2022   K 4.2 08/12/2022   CL 101 08/12/2022   CREATININE 1.19 08/12/2022   BUN 24 (H) 08/12/2022   CO2 28 08/12/2022   TSH 0.87 08/12/2022   INR 0.99 07/24/2012   HGBA1C 6.3 08/12/2022    Lab Results  Component Value Date   TSH 0.87 08/12/2022   Lab Results  Component Value Date   WBC 8.8 08/12/2022   HGB 14.0 08/12/2022   HCT 42.6 08/12/2022   MCV 98.2 08/12/2022   PLT 204.0 08/12/2022   Lab Results  Component Value Date   NA 141 08/12/2022    K 4.2 08/12/2022   CO2 28 08/12/2022   GLUCOSE 92 08/12/2022   BUN 24 (H) 08/12/2022   CREATININE 1.19 08/12/2022   BILITOT 1.6 (H) 08/12/2022   ALKPHOS 88 08/12/2022   AST 23 08/12/2022   ALT 25 08/12/2022   PROT 6.8 08/12/2022   ALBUMIN 3.8 08/12/2022   CALCIUM 9.1 08/12/2022   ANIONGAP 4 (L) 07/20/2020   EGFR 42 (L) 07/15/2022   GFR 40.57 (L) 08/12/2022   Lab Results  Component Value Date   CHOL 138 08/12/2022   Lab Results  Component Value Date   HDL 57.30 08/12/2022   Lab Results  Component Value Date   LDLCALC 64 08/12/2022   Lab Results  Component Value Date   TRIG 81.0 08/12/2022   Lab Results  Component Value Date   CHOLHDL 2 08/12/2022   Lab Results  Component Value Date   HGBA1C 6.3 08/12/2022       Assessment & Plan:  Hypothyroidism, unspecified type Assessment & Plan: On Levothyroxine, continue to monitor    Primary hypertension Assessment & Plan: Well controlled, no changes to meds. Encouraged heart healthy diet such as the DASH diet and exercise as tolerated.   Orders: -     Comprehensive metabolic panel -     CBC with Differential/Platelet -     TSH  Pure hypercholesterolemia Assessment & Plan: Encourage heart healthy diet such as MIND or DASH diet, increase exercise, avoid trans fats, simple carbohydrates and processed foods, consider a krill or fish or flaxseed oil cap daily.   Orders: -     Lipid panel  Hyperglycemia Assessment & Plan: hgba1c acceptable, minimize simple carbs. Increase exercise as tolerated.   Orders: -     Hemoglobin A1c  Atrial fibrillation, unspecified type [facility]) Assessment & Plan: Rate controlled tolerating Eliquis   Chronic renal impairment, unspecified CKD stage Assessment & Plan: Hydrate and monitor    High risk medication use -     Drug Monitoring Panel [phone] , Urine; Future  Back pain, unspecified back location, unspecified back pain laterality, unspecified chronicity -     Urinalysis,  Routine w reflex microscopic; Future -     Urine Culture; Future  Hypocalcemia -     Vitamin D 1,25 dihydroxy  Skin lesion of right lower extremity -     Ambulatory referral to Dermatology  Other orders -     Verapamil HCl ER; Take 1 tablet (240 mg total) by mouth daily.  Dispense: 90 tablet; Refill: 1 -     Budesonide-Formoterol Fumarate; Inhale 2 puffs into the lungs daily.  Dispense: 1 each; Refill: 5 -     Azelastine HCl; Place 1 spray into both nostrils daily.  Dispense: 30 mL; Refill: 5    Assessment and Plan    Hypertension: Patient is on Verapamil, which was mistakenly reported  as discontinued. -Restart Verapamil and send refill to [facility] on [address].  Asthma: Patient reports improvement with Symbicort, but still has some wheezing and increased mucus production. -Refill Symbicort. -Continue daily use of Claritin and Astelin.  Urinary Changes: Patient reports increased frequency and odor in urine. Mild back pain reported. -Order urinalysis to evaluate for possible infection or other abnormalities.  Vitamin D Deficiency: Previous blood work showed slightly low calcium, which may be due to low Vitamin D. -Order Vitamin D level.  Skin Lesion: Patient reports a new skin lesion present for about a week. -Refer to dermatologist for evaluation and possible biopsy.  Immunizations: Patient is due for tetanus booster in July. -Recommend RSV vaccination in the next month. -Recommend Shingles vaccination, especially given history of mild shingles outbreak. -Check status of pneumonia vaccination.  Follow-up: Patient to return in 4 months for routine follow-up.         Danise Edge, MD

## 2022-08-12 NOTE — Patient Instructions (Addendum)
RSV, Respiratory Syncitial Virus Vaccine, Arexvy at pharmacy  Shingrix is the new shingles shot, 2 shots over 2-6 months, confirm coverage with insurance and document, then can return here for shots with nurse appt or at pharmacy  Tetanus booster is due after 09/05/22  Hypothyroidism  Hypothyroidism is when the thyroid gland does not make enough of certain hormones. This is called an underactive thyroid. The thyroid gland is a small gland located in the lower front part of the neck, just in front of the windpipe (trachea). This gland makes hormones that help control how the body uses food for energy (metabolism) as well as how the heart and brain function. These hormones also play a role in keeping your bones strong. When the thyroid is underactive, it produces too little of the hormones thyroxine (T4) and triiodothyronine (T3). What are the causes? This condition may be caused by: Hashimoto's disease. This is a disease in which the body's disease-fighting system (immune system) attacks the thyroid gland. This is the most common cause. Viral infections. Pregnancy. Certain medicines. Birth defects. Problems with a gland in the center of the brain (pituitary gland). Lack of enough iodine in the diet. Other causes may include: Past radiation treatments to the head or neck for cancer. Past treatment with radioactive iodine. Past exposure to radiation in the environment. Past surgical removal of part or all of the thyroid. What increases the risk? You are more likely to develop this condition if: You are female. You have a family history of thyroid conditions. You use a medicine called lithium. You take medicines that affect the immune system (immunosuppressants). What are the signs or symptoms? Common symptoms of this condition include: Not being able to tolerate cold. Feeling as though you have no energy (lethargy). Lack of appetite. Constipation. Sadness or depression. Weight gain  that is not explained by a change in diet or exercise habits. Menstrual irregularity. Dry skin, coarse hair, or brittle nails. Other symptoms may include: Muscle pain. Slowing of thought processes. Poor memory. How is this diagnosed? This condition may be diagnosed based on: Your symptoms, your medical history, and a physical exam. Blood tests. You may also have imaging tests, such as an ultrasound or MRI. How is this treated? This condition is treated with medicine that replaces the thyroid hormones that your body does not make. After you begin treatment, it may take several weeks for symptoms to go away. Follow these instructions at home: Take over-the-counter and prescription medicines only as told by your health care provider. If you start taking any new medicines, tell your health care provider. Keep all follow-up visits as told by your health care provider. This is important. As your condition improves, your dosage of thyroid hormone medicine may change. You will need to have blood tests regularly so that your health care provider can monitor your condition. Contact a health care provider if: Your symptoms do not get better with treatment. You are taking thyroid hormone replacement medicine and you: Sweat a lot. Have tremors. Feel anxious. Lose weight rapidly. Cannot tolerate heat. Have emotional swings. Have diarrhea. Feel weak. Get help right away if: You have chest pain. You have an irregular heartbeat. You have a rapid heartbeat. You have difficulty breathing. These symptoms may be an emergency. Get help right away. Call 911. Do not wait to see if the symptoms will go away. Do not drive yourself to the hospital. Summary Hypothyroidism is when the thyroid gland does not make enough of certain hormones (it  is underactive). When the thyroid is underactive, it produces too little of the hormones thyroxine (T4) and triiodothyronine (T3). The most common cause is  Hashimoto's disease, a disease in which the body's disease-fighting system (immune system) attacks the thyroid gland. The condition can also be caused by viral infections, medicine, pregnancy, or past radiation treatment to the head or neck. Symptoms may include weight gain, dry skin, constipation, feeling as though you do not have energy, and not being able to tolerate cold. This condition is treated with medicine to replace the thyroid hormones that your body does not make. This information is not intended to replace advice given to you by your health care provider. Make sure you discuss any questions you have with your health care provider. Document Revised: 02/10/2021 Document Reviewed: 02/10/2021 Elsevier Patient Education  2024 [employer].

## 2022-08-13 ENCOUNTER — Other Ambulatory Visit (INDEPENDENT_AMBULATORY_CARE_PROVIDER_SITE_OTHER): Payer: Medicare Other

## 2022-08-13 DIAGNOSIS — M549 Dorsalgia, unspecified: Secondary | ICD-10-CM

## 2022-08-13 DIAGNOSIS — Z79899 Other long term (current) drug therapy: Secondary | ICD-10-CM

## 2022-08-13 LAB — URINALYSIS, ROUTINE W REFLEX MICROSCOPIC
Bilirubin Urine: NEGATIVE
Hgb urine dipstick: NEGATIVE
Ketones, ur: NEGATIVE
Nitrite: NEGATIVE
Specific Gravity, Urine: <=1.005 — AB (ref 1.000–1.030)
Total Protein, Urine: NEGATIVE
Urine Glucose: NEGATIVE
Urobilinogen, UA: 0.2 (ref 0.0–1.0)
pH: 5.5 (ref 5.0–8.0)

## 2022-08-16 LAB — VITAMIN D 1,25 DIHYDROXY
Vitamin D 1, 25 (OH)2 Total: 30 pg/mL (ref 18–72)
Vitamin D2 1, 25 (OH)2: <8 pg/mL
Vitamin D3 1, 25 (OH)2: 30 pg/mL

## 2022-08-17 ENCOUNTER — Other Ambulatory Visit: Payer: Self-pay

## 2022-08-17 DIAGNOSIS — M549 Dorsalgia, unspecified: Secondary | ICD-10-CM

## 2022-08-17 LAB — URINE CULTURE
MICRO NUMBER:: 15112365
SPECIMEN QUALITY:: ADEQUATE

## 2022-08-17 LAB — DRUG MONITORING PANEL 376104, URINE
Amphetamines: NEGATIVE ng/mL (ref <500)
Barbiturates: NEGATIVE ng/mL (ref <300)
Benzodiazepines: NEGATIVE ng/mL (ref <100)
Cocaine Metabolite: NEGATIVE ng/mL (ref <150)
Desmethyltramadol: NEGATIVE ng/mL (ref <100)
Opiates: NEGATIVE ng/mL (ref <100)
Oxycodone: NEGATIVE ng/mL (ref <100)
Tramadol: NEGATIVE ng/mL (ref <100)

## 2022-08-17 LAB — DM TEMPLATE

## 2022-08-17 MED ORDER — NITROFURANTOIN MONOHYD MACRO 100 MG PO CAPS: 100.0000 mg | ORAL_CAPSULE | Freq: Two times a day (BID) | ORAL | 0 refills | Status: DC

## 2022-08-23 NOTE — Progress Notes (Deleted)
HPI: FU atrial fibrillation. Nuclear study May 2014 showed breast attenuation but no ischemia. Ejection fraction 79%. Noted to be in new onset atrial 9/18. Seen in atrial fibrillation clinic; treated with rate control and anticoagulation. TSH normal. Echocardiogram May 2022 showed ejection fraction 55 to 60%, grade 2 diastolic dysfunction, mild right atrial enlargement, mild right ventricular enlargement, mild mitral regurgitation. Patient seen by pulmonary in January and Lasix increased for acute on chronic diastolic congestive heart failure.  Since last seen,   Current Outpatient Medications  Medication Sig Dispense Refill   acetaminophen (TYLENOL) 325 MG tablet Take 2 tablets (650 mg total) by mouth every 6 (six) hours as needed for mild pain (or Fever >/= 101).     albuterol (VENTOLIN HFA) 108 (90 Base) MCG/ACT inhaler Inhale 1 puff into the lungs every 6 (six) hours as needed for wheezing or shortness of breath. 30 each 2   ALPRAZolam (XANAX) 0.25 MG tablet Take 1 tablet (0.25 mg total) by mouth daily as needed for anxiety. 30 tablet 2   apixaban (ELIQUIS) 5 MG TABS tablet Take 1 tablet (5 mg total) by mouth 2 (two) times daily. Please keep upcoming appointment. Thanks. 60 tablet 1   atorvastatin (LIPITOR) 40 MG tablet TAKE 1 TABLET BY MOUTH ONCE DAILY APPT  NEEDED  FOR  FUTURE  REFILLS 90 tablet 3   Azelastine HCl 137 MCG/SPRAY SOLN Place 1 spray into both nostrils daily. 30 mL 5   budesonide-formoterol (SYMBICORT) 160-4.5 MCG/ACT inhaler Inhale 2 puffs into the lungs daily. 1 each 5   citalopram (CELEXA) 20 MG tablet Take 1 tablet by mouth once daily 90 tablet 0   Cranberry 500 MG CAPS Take 500 mg by mouth daily.     furosemide (LASIX) 20 MG tablet Take 2 tablets (40 mg total) by mouth daily. 180 tablet 3   levothyroxine (SYNTHROID) 75 MCG tablet TAKE 1 TABLET BY MOUTH ONCE DAILY BEFORE BREAKFAST 90 tablet 0   loratadine (CLARITIN) 10 MG tablet Take 10 mg by mouth daily.     Multiple  Vitamins-Minerals (CENTRUM SILVER PO) Take 1 tablet by mouth daily.     nitrofurantoin, macrocrystal-monohydrate, (MACROBID) 100 MG capsule Take 1 capsule (100 mg total) by mouth 2 (two) times daily. 14 capsule 0   verapamil (CALAN-SR) 240 MG CR tablet Take 1 tablet (240 mg total) by mouth daily. 90 tablet 1   No current facility-administered medications for this visit.     Past Medical History:  Diagnosis Date   Allergic rhinitis    Anemia    Anxiety    Arthritis    bilateral knee   Atrial fibrillation (HCC) 12/21/2016   Cervical cancer screening 10/30/2013   Menarche at 15 Regular and moderate flow No history of abnormal pap in past G4P3, s/p 3 svd and 1 MC No history of abnormal MGM Noconcerns today No gyn surgeries   Constipation 11/18/2015   Depression with anxiety    Diverticulitis    Heart murmur    Hyperlipidemia    Hypertension    Hypothyroid    Left knee DJD    Medicare annual wellness visit, subsequent 10/30/2013   PONV (postoperative nausea and vomiting)    Preventative health care 05/20/2016    Past Surgical History:  Procedure Laterality Date   ABDOMINAL HYSTERECTOMY     bladder tack  04/2010   Dr Marciano Sequin   ERCP N/A 07/16/2020   Procedure: ENDOSCOPIC RETROGRADE CHOLANGIOPANCREATOGRAPHY (ERCP);  Surgeon: Kerin Salen, MD;  Location: [facility];  Service: Gastroenterology;  Laterality: N/A;   GANGLION CYST EXCISION     JOINT REPLACEMENT Right    REMOVAL OF STONES  07/16/2020   Procedure: REMOVAL OF STONES;  Surgeon: Kerin Salen, MD;  Location: [facility];  Service: Gastroenterology;;   REPLACEMENT TOTAL KNEE  10/2010   right knee-Murphy    SPHINCTEROTOMY  07/16/2020   Procedure: SPHINCTEROTOMY;  Surgeon: Kerin Salen, MD;  Location: [facility];  Service: Gastroenterology;;   TONSILLECTOMY     TOTAL KNEE ARTHROPLASTY Left 07/26/2012   Procedure: TOTAL KNEE ARTHROPLASTY;  Surgeon: Loreta Ave, MD;  Location: [facility] OR;  Service: Orthopedics;  Laterality: Left;     Social History   Socioeconomic History   Marital status: Widowed    Spouse name: Not on file   Number of children: 3   Years of education: Not on file   Highest education level: Not on file  Occupational History   Not on file  Tobacco Use   Smoking status: Never   Smokeless tobacco: Never  Vaping Use   Vaping Use: Never used  Substance and Sexual Activity   Alcohol use: No   Drug use: No   Sexual activity: Not on file  Other Topics Concern   Not on file  Social History Narrative   Not on file   Social Determinants of Health   Financial Resource Strain: Low Risk  (02/11/2022)   Overall Financial Resource Strain (CARDIA)    Difficulty of Paying Living Expenses: Not hard at all  Food Insecurity: No Food Insecurity (02/11/2022)   Hunger Vital Sign    Worried About Running Out of Food in the Last Year: Never true    Ran Out of Food in the Last Year: Never true  Transportation Needs: No Transportation Needs (02/11/2022)   PRAPARE - Administrator, Civil Service (Medical): No    Lack of Transportation (Non-Medical): No  Physical Activity: Inactive (02/11/2022)   Exercise Vital Sign    Days of Exercise per Week: 0 days    Minutes of Exercise per Session: 0 min  Stress: No Stress Concern Present (02/11/2022)   [employer] of Occupational Health - Occupational Stress Questionnaire    Feeling of Stress : Not at all  Social Connections: Moderately Integrated (02/11/2022)   Social Connection and Isolation Panel [NHANES]    Frequency of Communication with Friends and Family: More than three times a week    Frequency of Social Gatherings with Friends and Family: Once a week    Attends Religious Services: More than 4 times per year    Active Member of [employer] or Organizations: Yes    Attends Banker Meetings: More than 4 times per year    Marital Status: Widowed  Intimate Partner Violence: Not At Risk (02/11/2022)   Humiliation, Afraid, Rape, and  Kick questionnaire    Fear of Current or Ex-Partner: No    Emotionally Abused: No    Physically Abused: No    Sexually Abused: No    Family History  Problem Relation Age of Onset   Alcohol abuse Father    Breast cancer Sister    Cancer Sister        bladder   Glaucoma Sister    Arthritis Daughter    Diabetes Daughter        type 2   Cancer Daughter        brain cancer, diagnosed 29 years.agp   Mental illness Daughter  s/p craniotomies, gamma knife for tumors. numerous shunts.   Heart failure Mother    Cancer Brother        glioblastoma    Glaucoma Brother    Diverticulosis Sister    Cancer Sister        liver   Cancer Brother        melanoma, mets to lung and brain and intestine   Alcohol abuse Brother    Cancer Brother        liver   Glaucoma Maternal Grandfather    Cancer Sister        breast   Colon cancer Neg Hx    Hypertension Neg Hx     ROS: no fevers or chills, productive cough, hemoptysis, dysphasia, odynophagia, melena, hematochezia, dysuria, hematuria, rash, seizure activity, orthopnea, PND, pedal edema, claudication. Remaining systems are negative.  Physical Exam: Well-developed well-nourished in no acute distress.  Skin is warm and dry.  HEENT is normal.  Neck is supple.  Chest is clear to auscultation with normal expansion.  Cardiovascular exam is regular rate and rhythm.  Abdominal exam nontender or distended. No masses palpated. Extremities show no edema. neuro grossly intact  ECG- personally reviewed  A/P  1 paroxysmal atrial fibrillation-patient was noted to be in atrial fibrillation at last office visit.  We elected conservative measures at her request.  Will continue verapamil for rate control as well as apixaban.  2 hypertension-patient's blood pressure is controlled.  Continue present medications and follow-up.  3 chronic diastolic congestive heart failure-continue Lasix at present dose.  She appears to be reasonably  well-controlled.  4 hyperlipidemia-continue statin.  [name], MD

## 2022-08-25 ENCOUNTER — Other Ambulatory Visit (INDEPENDENT_AMBULATORY_CARE_PROVIDER_SITE_OTHER): Payer: Medicare Other

## 2022-08-25 DIAGNOSIS — M549 Dorsalgia, unspecified: Secondary | ICD-10-CM

## 2022-08-25 LAB — URINALYSIS, ROUTINE W REFLEX MICROSCOPIC
Bilirubin Urine: NEGATIVE
Hgb urine dipstick: NEGATIVE
Ketones, ur: NEGATIVE
Nitrite: NEGATIVE
Specific Gravity, Urine: <=1.005 — AB (ref 1.000–1.030)
Total Protein, Urine: NEGATIVE
Urine Glucose: NEGATIVE
Urobilinogen, UA: 0.2 (ref 0.0–1.0)
pH: 7 (ref 5.0–8.0)

## 2022-08-26 LAB — URINE CULTURE
MICRO NUMBER:: 15158704
Result:: NO GROWTH
SPECIMEN QUALITY:: ADEQUATE

## 2022-09-01 ENCOUNTER — Ambulatory Visit: Payer: Medicare Other | Attending: Cardiology | Admitting: Cardiology

## 2022-09-03 ENCOUNTER — Telehealth: Payer: Self-pay | Admitting: Cardiology

## 2022-09-03 ENCOUNTER — Other Ambulatory Visit: Payer: Self-pay

## 2022-09-03 DIAGNOSIS — I4891 Unspecified atrial fibrillation: Secondary | ICD-10-CM

## 2022-09-03 MED ORDER — APIXABAN 5 MG PO TABS
5.0000 mg | ORAL_TABLET | Freq: Two times a day (BID) | ORAL | 5 refills | Status: DC
Start: 2022-09-03 — End: 2023-03-14

## 2022-09-03 NOTE — Telephone Encounter (Signed)
Prescription refill request for Eliquis received. Indication:afib Last office visit:5/24 Scr:1.19   6/24 Age: 89 Weight:82.2  kg  Prescription refilled

## 2022-09-03 NOTE — Telephone Encounter (Signed)
*  STAT* If patient is at the pharmacy, call can be transferred to refill team.   1. Which medications need to be refilled? (please list name of each medication and dose if known)   apixaban (ELIQUIS) 5 MG TABS tablet   2. Which pharmacy/location (including street and city if local pharmacy) is medication to be sent to?  [facility] - [place], [place] - [address]   3. Do they need a 30 day or 90 day supply?   60 day  Patient stated she only has 3 days left of this medication.

## 2022-10-13 ENCOUNTER — Other Ambulatory Visit: Payer: Self-pay | Admitting: Family Medicine

## 2022-10-20 ENCOUNTER — Other Ambulatory Visit: Payer: Self-pay | Admitting: Family Medicine

## 2022-10-23 ENCOUNTER — Emergency Department (HOSPITAL_BASED_OUTPATIENT_CLINIC_OR_DEPARTMENT_OTHER)
Admission: EM | Admit: 2022-10-23 | Discharge: 2022-10-23 | Disposition: A | Discharge status: Home / Self Care | Payer: Medicare Other | Attending: Emergency Medicine | Admitting: Emergency Medicine

## 2022-10-23 ENCOUNTER — Other Ambulatory Visit: Payer: Self-pay

## 2022-10-23 ENCOUNTER — Emergency Department (HOSPITAL_BASED_OUTPATIENT_CLINIC_OR_DEPARTMENT_OTHER): Payer: Medicare Other

## 2022-10-23 ENCOUNTER — Encounter (HOSPITAL_BASED_OUTPATIENT_CLINIC_OR_DEPARTMENT_OTHER): Payer: Self-pay

## 2022-10-23 DIAGNOSIS — M436 Torticollis: Secondary | ICD-10-CM | POA: Diagnosis not present

## 2022-10-23 DIAGNOSIS — M542 Cervicalgia: Secondary | ICD-10-CM | POA: Diagnosis present

## 2022-10-23 DIAGNOSIS — Z7901 Long term (current) use of anticoagulants: Secondary | ICD-10-CM | POA: Diagnosis not present

## 2022-10-23 MED ORDER — ACETAMINOPHEN 325 MG PO TABS
650.0000 mg | ORAL_TABLET | Freq: Once | ORAL | Status: AC
  Administered 2022-10-23: 650 mg via ORAL
  Filled 2022-10-23: qty 2

## 2022-10-23 MED ORDER — TRAMADOL HCL 50 MG PO TABS
25.0000 mg | ORAL_TABLET | Freq: Four times a day (QID) | ORAL | 0 refills | Status: DC | PRN
Start: 2022-10-23 — End: 2022-12-23

## 2022-10-23 NOTE — ED Provider Notes (Signed)
[place] [facility] Provider Note   CSN: [id number] Arrival date & time: 10/23/22  1046     History  Chief Complaint  Patient presents with   Neck Pain   Shoulder Pain    Alyssa Williamson is a 89 y.o. female.  Who presents emergency department chief complaint of neck pain.  Patient reports that she thinks she might of "slept funny" on her neck when she woke up with pain on the left side of her neck yesterday morning.  Patient states it has progressively worsened she states it hurts to laterally rotate her neck in either direction.  She is having to turn her entire torso to turn her head because it hurts so much and she is having to use her right arm more but denies any numbness weakness or tingling.  She took a Xanax earlier today which she states was to help her muscles relax.  She did not take pain medication.  She denies any injuries she denies any changes in vision vertigo or severe headache.   Neck Pain Shoulder Pain Associated symptoms: neck pain        Home Medications Prior to Admission medications   Medication Sig Start Date End Date Taking? Authorizing Provider  acetaminophen (TYLENOL) 325 MG tablet Take 2 tablets (650 mg total) by mouth every 6 (six) hours as needed for mild pain (or Fever >/= 101). 07/20/20   Rodolph Bong, MD  albuterol (VENTOLIN HFA) 108 (90 Base) MCG/ACT inhaler Inhale 1 puff into the lungs every 6 (six) hours as needed for wheezing or shortness of breath. 02/10/22   Bradd Canary, MD  ALPRAZolam [name] 0.25 MG tablet Take 1 tablet (0.25 mg total) by mouth daily as needed for anxiety. 07/30/22   Bradd Canary, MD  apixaban (ELIQUIS) 5 MG TABS tablet Take 1 tablet (5 mg total) by mouth 2 (two) times daily. Please keep upcoming appointment. Thanks. 09/03/22   Lewayne Bunting, MD  atorvastatin (LIPITOR) 40 MG tablet TAKE 1 TABLET BY MOUTH ONCE DAILY APPT  NEEDED  FOR  FUTURE  REFILLS 08/02/22   Lewayne Bunting,  MD  Azelastine HCl 137 MCG/SPRAY SOLN Place 1 spray into both nostrils daily. 08/12/22   Bradd Canary, MD  budesonide-formoterol [facility]) 160-4.5 MCG/ACT inhaler Inhale 2 puffs into the lungs daily. 08/12/22   Bradd Canary, MD  citalopram (CELEXA) 20 MG tablet Take 1 tablet (20 mg total) by mouth daily. 10/14/22   Bradd Canary, MD  Cranberry 500 MG CAPS Take 500 mg by mouth daily.    [provider]  furosemide (LASIX) 20 MG tablet Take 2 tablets (40 mg total) by mouth daily. 07/07/22   Lewayne Bunting, MD  levothyroxine (SYNTHROID) 75 MCG tablet TAKE 1 TABLET BY MOUTH ONCE DAILY BEFORE BREAKFAST 10/20/22   Bradd Canary, MD  loratadine (CLARITIN) 10 MG tablet Take 10 mg by mouth daily.    [provider]  Multiple Vitamins-Minerals (CENTRUM SILVER PO) Take 1 tablet by mouth daily.    [provider]  nitrofurantoin, macrocrystal-monohydrate, (MACROBID) 100 MG capsule Take 1 capsule (100 mg total) by mouth 2 (two) times daily. 08/17/22   Bradd Canary, MD  verapamil (CALAN-SR) 240 MG CR tablet Take 1 tablet (240 mg total) by mouth daily. 08/12/22   Bradd Canary, MD      Allergies    Cefdinir and Codeine    Review of Systems  Review of Systems  Musculoskeletal:  Positive for neck pain.    Physical Exam Updated Vital Signs BP (!) 145/113   Pulse (!) 104   Temp (!) 97.4 F (36.3 C) (Oral)   Resp 18   Ht 5\' 2"  (1.575 m)   Wt 82.1 kg   SpO2 97%   BMI 33.11 kg/m  Physical Exam Vitals and nursing note reviewed.  Constitutional:      General: She is not in acute distress.    Appearance: She is well-developed. She is not diaphoretic.  HENT:     Head: Normocephalic and atraumatic.     Right Ear: External ear normal.     Left Ear: External ear normal.     Nose: Nose normal.     Mouth/Throat:     Mouth: Mucous membranes are moist.  Eyes:     General: No scleral icterus.    Conjunctiva/sclera: Conjunctivae normal.  Cardiovascular:     Rate  and Rhythm: Normal rate and regular rhythm.     Heart sounds: Normal heart sounds. No murmur heard.    No friction rub. No gallop.  Pulmonary:     Effort: Pulmonary effort is normal. No respiratory distress.     Breath sounds: Normal breath sounds.  Abdominal:     General: Bowel sounds are normal. There is no distension.     Palpations: Abdomen is soft. There is no mass.     Tenderness: There is no abdominal tenderness. There is no guarding.  Musculoskeletal:     Cervical back: Normal range of motion.     Comments: No midline spinal tenderness.  Tender to palpation along the left paraspinal muscles.  Pain with left lateral rotation bilaterally rotation flexion and extension  Skin:    General: Skin is warm and dry.  Neurological:     Mental Status: She is alert and oriented to person, place, and time.  Psychiatric:        Behavior: Behavior normal.     ED Results / Procedures / Treatments   Labs (all labs ordered are listed, but only abnormal results are displayed) Labs Reviewed - No data to display  EKG None  Radiology No results found.  Procedures Procedures    Medications Ordered in ED Medications - No data to display  ED Course/ Medical Decision Making/ A&P Clinical Course as of 10/23/22 1630  Sat Oct 23, 2022  1506 CT Cervical Spine Wo Contrast [AH]    Clinical Course User Index [AH] Arthor Captain, PA-C                                 Medical Decision Making 89 year old female who presents emergency department with acute onset neck pain starting yesterday morning.  Differential diagnosis includes pathologic or compression fracture, no evidence of deep space infection, no recent strep infection suggestive of Lemierre's disease,  I ordered and interpreted CT cervical spine shows advanced degenerative changes without pathologic fracture or other abnormality.  Patient does not have any neurologic deficits including weakness or paresthesia.  No neurologic  deficits suggestive of vertebral artery dissection.  Will treat with tramadol, have suggested lidocaine patch.  She has a neck brace at home.  PDMP reviewed during this visit.  Patient appears otherwise appropriate for discharge with outpatient follow-up and return precautions.  Amount and/or Complexity of Data Reviewed Radiology: ordered. Decision-making details documented in ED Course.  Risk OTC drugs. Prescription drug management.  Final Clinical Impression(s) / ED Diagnoses Final diagnoses:  Acute torticollis    Rx / DC Orders ED Discharge Orders     None         Arthor Captain, PA-C 10/23/22 1636    Tanda Rockers A, DO 10/25/22 1617

## 2022-10-23 NOTE — ED Notes (Signed)
Discharge instructions reviewed with patient. Patient questions answered and opportunity for education reviewed. Patient voices understanding of discharge instructions with no further questions. Patient ambulatory with steady gait to lobby.  

## 2022-10-23 NOTE — Discharge Instructions (Signed)
Contact a health care provider if: You have a fever. Your symptoms do not improve or they get worse. Get help right away if: You have trouble breathing. You make loud, high-pitched sounds when you breathe, most often when you breathe in (stridor). You start to drool. You have trouble swallowing or pain when swallowing. You develop numbness or weakness in your hands or feet. You have changes in your speech, understanding, or vision. You are in severe pain. You cannot move your head or neck. These symptoms may represent a serious problem that is an emergency. Do not wait to see if the symptoms will go away. Get medical help right away. Call your local emergency services (911 in the [place] Do not drive yourself to the hospital.

## 2022-10-23 NOTE — ED Triage Notes (Signed)
The patient stated that yesterday when she woke up she was having left neck and shoulder pain that's an ache. It does increase with movement. No chest pain or shortness of breath.  No injury.

## 2022-11-10 ENCOUNTER — Other Ambulatory Visit: Payer: Self-pay | Admitting: Family Medicine

## 2022-11-10 NOTE — Telephone Encounter (Signed)
Prescription Request  11/10/2022  Is this a "Controlled Substance" medicine? Yes  LOV: 08/12/2022  What is the name of the medication or equipment?   ALPRAZolam (XANAX) 0.25 MG tablet [[id number]]  Have you contacted your pharmacy to request a refill? No   Which pharmacy would you like this sent to?   [facility] [address] [place], [place] - [address] [place] [address] Phone: [phone] Fax: [phone]    Patient notified that their request is being sent to the clinical staff for review and that they should receive a response within 2 business days.   Please advise at Mobile [phone] (mobile)

## 2022-11-11 ENCOUNTER — Other Ambulatory Visit: Payer: Self-pay | Admitting: Family Medicine

## 2022-11-11 NOTE — Addendum Note (Signed)
Addended by: Ashok Norris R on: 11/11/2022 10:20 AM   Modules accepted: Orders

## 2022-11-11 NOTE — Telephone Encounter (Signed)
Requesting: Alprazolam 0.25mg  Contract: 08/12/2022 UDS: 08/13/2022 Last Visit: 08/12/2022 Next Visit: 12/13/2022 Last Refill: 07/30/2022 #30 x 2 RF  Please Advise

## 2022-11-11 NOTE — Telephone Encounter (Signed)
Requesting: alprazolam 0.25mg   Contract: 08/13/22 UDS: 08/13/22 Last Visit: 08/12/22 Next Visit: 12/13/22 Last Refill: 07/30/22 #30 and 2RF   Please Advise

## 2022-11-12 MED ORDER — ALPRAZOLAM 0.25 MG PO TABS: 0.2500 mg | ORAL_TABLET | Freq: Every day | ORAL | 2 refills | Status: DC | PRN

## 2022-12-09 ENCOUNTER — Telehealth: Payer: Self-pay | Admitting: Family Medicine

## 2022-12-09 ENCOUNTER — Other Ambulatory Visit: Payer: Self-pay | Admitting: Family Medicine

## 2022-12-09 MED ORDER — ALPRAZOLAM 0.25 MG PO TABS: 0.2500 mg | ORAL_TABLET | Freq: Every day | ORAL | 2 refills | Status: DC | PRN

## 2022-12-09 NOTE — Telephone Encounter (Signed)
Medication:  ALPRAZolam (XANAX) 0.25 MG tablet  Has the patient contacted their pharmacy? Yes.     Preferred Pharmacy:    [facility] [address] [place], [place] - [address] [place] [address] Phone: [phone]  Fax: [phone]

## 2022-12-13 ENCOUNTER — Ambulatory Visit: Payer: Medicare Other | Admitting: Family Medicine

## 2022-12-22 NOTE — Assessment & Plan Note (Signed)
Well controlled, no changes to meds. Encouraged heart healthy diet such as the DASH diet and exercise as tolerated.  °

## 2022-12-22 NOTE — Assessment & Plan Note (Signed)
Rate controlled tolerating meds 

## 2022-12-22 NOTE — Assessment & Plan Note (Signed)
Encourage heart healthy diet such as MIND or DASH diet, increase exercise, avoid trans fats, simple carbohydrates and processed foods, consider a krill or fish or flaxseed oil cap daily.  °

## 2022-12-22 NOTE — Assessment & Plan Note (Signed)
On Levothyroxine, continue to monitor 

## 2022-12-22 NOTE — Assessment & Plan Note (Signed)
Hydrate and monitor 

## 2022-12-22 NOTE — Assessment & Plan Note (Signed)
hgba1c acceptable, minimize simple carbs. Increase exercise as tolerated.  

## 2022-12-23 ENCOUNTER — Ambulatory Visit (INDEPENDENT_AMBULATORY_CARE_PROVIDER_SITE_OTHER): Payer: Medicare Other | Admitting: Family Medicine

## 2022-12-23 ENCOUNTER — Encounter: Payer: Self-pay | Admitting: Family Medicine

## 2022-12-23 VITALS — BP 134/86 | HR 84 | Temp 98.1°F | Resp 18 | Ht 62.0 in | Wt 179.8 lb

## 2022-12-23 DIAGNOSIS — N189 Chronic kidney disease, unspecified: Secondary | ICD-10-CM | POA: Diagnosis not present

## 2022-12-23 DIAGNOSIS — I4891 Unspecified atrial fibrillation: Secondary | ICD-10-CM

## 2022-12-23 DIAGNOSIS — F419 Anxiety disorder, unspecified: Secondary | ICD-10-CM

## 2022-12-23 DIAGNOSIS — R739 Hyperglycemia, unspecified: Secondary | ICD-10-CM | POA: Diagnosis not present

## 2022-12-23 DIAGNOSIS — Z79899 Other long term (current) drug therapy: Secondary | ICD-10-CM

## 2022-12-23 DIAGNOSIS — E7800 Pure hypercholesterolemia, unspecified: Secondary | ICD-10-CM

## 2022-12-23 DIAGNOSIS — I10 Essential (primary) hypertension: Secondary | ICD-10-CM | POA: Diagnosis not present

## 2022-12-23 DIAGNOSIS — E039 Hypothyroidism, unspecified: Secondary | ICD-10-CM

## 2022-12-23 LAB — LIPID PANEL
Cholesterol: 160 mg/dL (ref 0–200)
HDL: 68.5 mg/dL (ref >39.00)
LDL Cholesterol: 76 mg/dL (ref 0–99)
NonHDL: 91.31
Total CHOL/HDL Ratio: 2
Triglycerides: 78 mg/dL (ref 0.0–149.0)
VLDL: 15.6 mg/dL (ref 0.0–40.0)

## 2022-12-23 LAB — COMPREHENSIVE METABOLIC PANEL
ALT: 31 U/L (ref 0–35)
AST: 23 U/L (ref 0–37)
Albumin: 3.8 g/dL (ref 3.5–5.2)
Alkaline Phosphatase: 88 U/L (ref 39–117)
BUN: 18 mg/dL (ref 6–23)
CO2: 30 meq/L (ref 19–32)
Calcium: 8.9 mg/dL (ref 8.4–10.5)
Chloride: 100 meq/L (ref 96–112)
Creatinine, Ser: 1.08 mg/dL (ref 0.40–1.20)
GFR: 45.46 mL/min — ABNORMAL LOW (ref >60.00)
Glucose, Bld: 72 mg/dL (ref 70–99)
Potassium: 4.3 meq/L (ref 3.5–5.1)
Sodium: 140 meq/L (ref 135–145)
Total Bilirubin: 1.2 mg/dL (ref 0.2–1.2)
Total Protein: 6.6 g/dL (ref 6.0–8.3)

## 2022-12-23 LAB — CBC WITH DIFFERENTIAL/PLATELET
Basophils Absolute: 0.1 10*3/uL (ref 0.0–0.1)
Basophils Relative: 1 % (ref 0.0–3.0)
Eosinophils Absolute: 0.2 10*3/uL (ref 0.0–0.7)
Eosinophils Relative: 2.5 % (ref 0.0–5.0)
HCT: 44.7 % (ref 36.0–46.0)
Hemoglobin: 14.5 g/dL (ref 12.0–15.0)
Lymphocytes Relative: 16.4 % (ref 12.0–46.0)
Lymphs Abs: 1.3 10*3/uL (ref 0.7–4.0)
MCHC: 32.4 g/dL (ref 30.0–36.0)
MCV: 100.1 fL — ABNORMAL HIGH (ref 78.0–100.0)
Monocytes Absolute: 0.8 10*3/uL (ref 0.1–1.0)
Monocytes Relative: 10.2 % (ref 3.0–12.0)
Neutro Abs: 5.5 10*3/uL (ref 1.4–7.7)
Neutrophils Relative %: 69.9 % (ref 43.0–77.0)
Platelets: 174 10*3/uL (ref 150.0–400.0)
RBC: 4.47 Mil/uL (ref 3.87–5.11)
RDW: 14.8 % (ref 11.5–15.5)
WBC: 7.8 10*3/uL (ref 4.0–10.5)

## 2022-12-23 LAB — TSH: TSH: 2.04 u[IU]/mL (ref 0.35–5.50)

## 2022-12-23 LAB — HEMOGLOBIN A1C: Hgb A1c MFr Bld: 6.3 % (ref 4.6–6.5)

## 2022-12-23 NOTE — Progress Notes (Signed)
Subjective:    Patient ID: Alyssa Williamson, female    DOB: January 15, 1932, 89 y.o.   MRN: [id number]  Chief Complaint  Patient presents with  . Follow-up    HPI Discussed the use of AI scribe software for clinical note transcription with the patient, who gave verbal consent to proceed.  History of Present Illness   The patient, a 89 year old with a history of bronchitis, sinus infection, and shingles, presents with a recent flare-up of symptoms following an RSV shot. The patient reports that she was treated for bronchitis and sinus infection at an urgent care clinic, where she was prescribed an antibiotic, a nasal spray, and ear drops. The patient also mentions a history of a virus that lasted six weeks, which she believes may have become dormant in her body and occasionally flares up. The patient believes that the RSV shot may have triggered a flare-up of this dormant virus. The patient also reports a history of shingles and is currently taking alprazolam.        Past Medical History:  Diagnosis Date  . Allergic rhinitis   . Anemia   . Anxiety   . Arthritis    bilateral knee  . Atrial fibrillation (HCC) 12/21/2016  . Cervical cancer screening 10/30/2013   Menarche at 15 Regular and moderate flow No history of abnormal pap in past G4P3, s/p 3 svd and 1 MC No history of abnormal MGM Noconcerns today No gyn surgeries  . Constipation 11/18/2015  . Depression with anxiety   . Diverticulitis   . Heart murmur   . Hyperlipidemia   . Hypertension   . Hypothyroid   . Left knee DJD   . Medicare annual wellness visit, subsequent 10/30/2013  . PONV (postoperative nausea and vomiting)   . Preventative health care 05/20/2016    Past Surgical History:  Procedure Laterality Date  . ABDOMINAL HYSTERECTOMY    . bladder tack  04/2010   Dr Marciano Sequin  . ERCP N/A 07/16/2020   Procedure: ENDOSCOPIC RETROGRADE CHOLANGIOPANCREATOGRAPHY (ERCP);  Surgeon: Kerin Salen, MD;  Location: [facility];   Service: Gastroenterology;  Laterality: N/A;  . GANGLION CYST EXCISION    . JOINT REPLACEMENT Right   . REMOVAL OF STONES  07/16/2020   Procedure: REMOVAL OF STONES;  Surgeon: Kerin Salen, MD;  Location: [facility];  Service: Gastroenterology;;  . REPLACEMENT TOTAL KNEE  10/2010   right knee-Murphy   . SPHINCTEROTOMY  07/16/2020   Procedure: SPHINCTEROTOMY;  Surgeon: Kerin Salen, MD;  Location: [facility];  Service: Gastroenterology;;  . TONSILLECTOMY    . TOTAL KNEE ARTHROPLASTY Left 07/26/2012   Procedure: TOTAL KNEE ARTHROPLASTY;  Surgeon: Loreta Ave, MD;  Location: [facility] OR;  Service: Orthopedics;  Laterality: Left;    Family History  Problem Relation Age of Onset  . Alcohol abuse Father   . Breast cancer Sister   . Cancer Sister        bladder  . Glaucoma Sister   . Arthritis Daughter   . Diabetes Daughter        type 2  . Cancer Daughter        brain cancer, diagnosed 29 years.agp  . Mental illness Daughter        s/p craniotomies, gamma knife for tumors. numerous shunts.  . Heart failure Mother   . Cancer Brother        glioblastoma   . Glaucoma Brother   . Diverticulosis Sister   . Cancer Sister  liver  . Cancer Brother        melanoma, mets to lung and brain and intestine  . Alcohol abuse Brother   . Cancer Brother        liver  . Glaucoma Maternal Grandfather   . Cancer Sister        breast  . Colon cancer Neg Hx   . Hypertension Neg Hx     Social History   Socioeconomic History  . Marital status: Widowed    Spouse name: Not on file  . Number of children: 3  . Years of education: Not on file  . Highest education level: Not on file  Occupational History  . Not on file  Tobacco Use  . Smoking status: Never  . Smokeless tobacco: Never  Vaping Use  . Vaping status: Never Used  Substance and Sexual Activity  . Alcohol use: No  . Drug use: No  . Sexual activity: Not on file  Other Topics Concern  . Not on file  Social History Narrative   . Not on file   Social Determinants of Health   Financial Resource Strain: Low Risk  (02/11/2022)   Overall Financial Resource Strain (CARDIA)   . Difficulty of Paying Living Expenses: Not hard at all  Food Insecurity: No Food Insecurity (02/11/2022)   Hunger Vital Sign   . Worried About Programme researcher, broadcasting/film/video in the Last Year: Never true   . Ran Out of Food in the Last Year: Never true  Transportation Needs: No Transportation Needs (02/11/2022)   PRAPARE - Transportation   . Lack of Transportation (Medical): No   . Lack of Transportation (Non-Medical): No  Physical Activity: Inactive (02/11/2022)   Exercise Vital Sign   . Days of Exercise per Week: 0 days   . Minutes of Exercise per Session: 0 min  Stress: No Stress Concern Present (02/11/2022)   [employer] of Occupational Health - Occupational Stress Questionnaire   . Feeling of Stress : Not at all  Social Connections: Moderately Integrated (02/11/2022)   Social Connection and Isolation Panel [NHANES]   . Frequency of Communication with Friends and Family: More than three times a week   . Frequency of Social Gatherings with Friends and Family: Once a week   . Attends Religious Services: More than 4 times per year   . Active Member of Clubs or Organizations: Yes   . Attends Banker Meetings: More than 4 times per year   . Marital Status: Widowed  Intimate Partner Violence: Not At Risk (02/11/2022)   Humiliation, Afraid, Rape, and Kick questionnaire   . Fear of Current or Ex-Partner: No   . Emotionally Abused: No   . Physically Abused: No   . Sexually Abused: No    Outpatient Medications Prior to Visit  Medication Sig Dispense Refill  . acetaminophen (TYLENOL) 325 MG tablet Take 2 tablets (650 mg total) by mouth every 6 (six) hours as needed for mild pain (or Fever >/= 101).    Marland Kitchen albuterol (VENTOLIN HFA) 108 (90 Base) MCG/ACT inhaler Inhale 1 puff into the lungs every 6 (six) hours as needed for wheezing or  shortness of breath. 30 each 2  . ALPRAZolam (XANAX) 0.25 MG tablet Take 1 tablet (0.25 mg total) by mouth daily as needed for anxiety. 30 tablet 2  . apixaban (ELIQUIS) 5 MG TABS tablet Take 1 tablet (5 mg total) by mouth 2 (two) times daily. Please keep upcoming appointment. Thanks. 60 tablet 5  .  atorvastatin (LIPITOR) 40 MG tablet TAKE 1 TABLET BY MOUTH ONCE DAILY APPT  NEEDED  FOR  FUTURE  REFILLS 90 tablet 3  . Azelastine HCl 137 MCG/SPRAY SOLN Place 1 spray into both nostrils daily. 30 mL 5  . cefdinir (OMNICEF) 300 MG capsule Take 1 capsule (300 mg total) by mouth 2 (two) times daily.    . citalopram (CELEXA) 20 MG tablet Take 1 tablet (20 mg total) by mouth daily. 90 tablet 1  . Cranberry 500 MG CAPS Take 500 mg by mouth daily.    . fluticasone (FLONASE) 50 MCG/ACT nasal spray Place 2 sprays into both nostrils daily.    . furosemide (LASIX) 20 MG tablet Take 2 tablets (40 mg total) by mouth daily. 180 tablet 3  . levothyroxine (SYNTHROID) 75 MCG tablet TAKE 1 TABLET BY MOUTH ONCE DAILY BEFORE BREAKFAST 90 tablet 0  . loratadine (CLARITIN) 10 MG tablet Take 10 mg by mouth daily.    . Multiple Vitamins-Minerals (CENTRUM SILVER PO) Take 1 tablet by mouth daily.    . verapamil (CALAN-SR) 240 MG CR tablet Take 1 tablet (240 mg total) by mouth daily. 90 tablet 1  . nitrofurantoin, macrocrystal-monohydrate, (MACROBID) 100 MG capsule Take 1 capsule (100 mg total) by mouth 2 (two) times daily. 14 capsule 0  . budesonide-formoterol (SYMBICORT) 160-4.5 MCG/ACT inhaler Inhale 2 puffs into the lungs daily. (Patient not taking: Reported on 12/23/2022) 1 each 5  . traMADol (ULTRAM) 50 MG tablet Take 0.5-1 tablets (25-50 mg total) by mouth every 6 (six) hours as needed. (Patient not taking: Reported on 12/23/2022) 15 tablet 0   No facility-administered medications prior to visit.    Allergies  Allergen Reactions  . Cefdinir Dermatitis  . Codeine Nausea And Vomiting    Review of Systems   Constitutional:  Negative for fever and malaise/fatigue.  HENT:  Negative for congestion.   Eyes:  Negative for blurred vision.  Respiratory:  Negative for shortness of breath.   Cardiovascular:  Negative for chest pain, palpitations and leg swelling.  Gastrointestinal:  Negative for abdominal pain, blood in stool and nausea.  Genitourinary:  Negative for dysuria and frequency.  Musculoskeletal:  Negative for falls.  Skin:  Negative for rash.  Neurological:  Negative for dizziness, loss of consciousness and headaches.  Endo/Heme/Allergies:  Negative for environmental allergies.  Psychiatric/Behavioral:  Negative for depression. The patient is not nervous/anxious.       Objective:    Physical Exam Constitutional:      General: She is not in acute distress.    Appearance: Normal appearance. She is well-developed. She is not toxic-appearing.  HENT:     Head: Normocephalic and atraumatic.     Right Ear: External ear normal.     Left Ear: External ear normal.     Nose: Nose normal.  Eyes:     General:        Right eye: No discharge.        Left eye: No discharge.     Conjunctiva/sclera: Conjunctivae normal.  Neck:     Thyroid: No thyromegaly.  Cardiovascular:     Rate and Rhythm: Normal rate and regular rhythm.     Heart sounds: Normal heart sounds. No murmur heard. Pulmonary:     Effort: Pulmonary effort is normal. No respiratory distress.     Breath sounds: Normal breath sounds.  Abdominal:     General: Bowel sounds are normal.     Palpations: Abdomen is soft.  Tenderness: There is no abdominal tenderness. There is no guarding.  Musculoskeletal:        General: Normal range of motion.     Cervical back: Neck supple.  Lymphadenopathy:     Cervical: No cervical adenopathy.  Skin:    General: Skin is warm and dry.  Neurological:     Mental Status: She is alert and oriented to person, place, and time.  Psychiatric:        Mood and Affect: Mood normal.         Behavior: Behavior normal.        Thought Content: Thought content normal.        Judgment: Judgment normal.   BP 134/86 (BP Location: Left Arm, Patient Position: Sitting, Cuff Size: Normal)   Pulse 84   Temp 98.1 F (36.7 C) (Oral)   Resp 18   Ht 5\' 2"  (1.575 m)   Wt 179 lb 12.8 oz (81.6 kg)   SpO2 95%   BMI 32.89 kg/m  Wt Readings from Last 3 Encounters:  12/23/22 179 lb 12.8 oz (81.6 kg)  10/23/22 181 lb (82.1 kg)  08/12/22 181 lb 3.2 oz (82.2 kg)    Diabetic Foot Exam - Simple   No data filed    Lab Results  Component Value Date   WBC 8.8 08/12/2022   HGB 14.0 08/12/2022   HCT 42.6 08/12/2022   PLT 204.0 08/12/2022   GLUCOSE 92 08/12/2022   CHOL 138 08/12/2022   TRIG 81.0 08/12/2022   HDL 57.30 08/12/2022   LDLCALC 64 08/12/2022   ALT 25 08/12/2022   AST 23 08/12/2022   NA 141 08/12/2022   K 4.2 08/12/2022   CL 101 08/12/2022   CREATININE 1.19 08/12/2022   BUN 24 (H) 08/12/2022   CO2 28 08/12/2022   TSH 0.87 08/12/2022   INR 0.99 07/24/2012   HGBA1C 6.3 08/12/2022    Lab Results  Component Value Date   TSH 0.87 08/12/2022   Lab Results  Component Value Date   WBC 8.8 08/12/2022   HGB 14.0 08/12/2022   HCT 42.6 08/12/2022   MCV 98.2 08/12/2022   PLT 204.0 08/12/2022   Lab Results  Component Value Date   NA 141 08/12/2022   K 4.2 08/12/2022   CO2 28 08/12/2022   GLUCOSE 92 08/12/2022   BUN 24 (H) 08/12/2022   CREATININE 1.19 08/12/2022   BILITOT 1.6 (H) 08/12/2022   ALKPHOS 88 08/12/2022   AST 23 08/12/2022   ALT 25 08/12/2022   PROT 6.8 08/12/2022   ALBUMIN 3.8 08/12/2022   CALCIUM 9.1 08/12/2022   ANIONGAP 4 (L) 07/20/2020   EGFR 42 (L) 07/15/2022   GFR 40.57 (L) 08/12/2022   Lab Results  Component Value Date   CHOL 138 08/12/2022   Lab Results  Component Value Date   HDL 57.30 08/12/2022   Lab Results  Component Value Date   LDLCALC 64 08/12/2022   Lab Results  Component Value Date   TRIG 81.0 08/12/2022   Lab Results   Component Value Date   CHOLHDL 2 08/12/2022   Lab Results  Component Value Date   HGBA1C 6.3 08/12/2022       Assessment & Plan:  Atrial fibrillation, unspecified type (HCC) Assessment & Plan: Rate controlled tolerating meds   Chronic renal impairment, unspecified CKD stage Assessment & Plan: Hydrate and monitor    Primary hypertension Assessment & Plan: Well controlled, no changes to meds. Encouraged heart healthy diet such as  the DASH diet and exercise as tolerated.   Orders: -     Comprehensive metabolic panel -     CBC with Differential/Platelet -     TSH  Hyperglycemia Assessment & Plan: hgba1c acceptable, minimize simple carbs. Increase exercise as tolerated.   Orders: -     Hemoglobin A1c  Pure hypercholesterolemia Assessment & Plan: Encourage heart healthy diet such as MIND or DASH diet, increase exercise, avoid trans fats, simple carbohydrates and processed foods, consider a krill or fish or flaxseed oil cap daily.   Orders: -     Lipid panel  Hypothyroidism, unspecified type Assessment & Plan: On Levothyroxine, continue to monitor    Anxiety -     Drug Monitoring Panel [phone] , Urine  High risk medication use -     Drug Monitoring Panel [phone] , Urine    Assessment and Plan    Post-viral syndrome Patient reports a history of prolonged viral illness with residual symptoms of nervousness and discomfort. Recent RSV vaccination may have triggered a similar response. -Continue monitoring symptoms.  Sinusitis Presented with symptoms of sinusitis, including hearing loss and mucus production. Treated with cefdinir 300mg  twice daily, Flonase nasal spray once daily, and antibiotic ear drops. Symptoms improving. -Continue current treatment regimen for 10 days. -Obtain records from [facility] urgent care to confirm treatment and possible cefdinir allergy.  Weight loss Patient reports decreased appetite and weight loss. -Encourage continued healthy eating  habits and hydration.  General Health Maintenance -Annual COVID booster recommended in 2024. -Consider shingles vaccination due to new studies showing decreased risk of dementia. -Order routine blood work to check sugar, cholesterol, and thyroid levels. -Perform urine drug screen today to avoid winter visit. -Schedule follow-up visit in May 2024.         Danise Edge, MD

## 2022-12-23 NOTE — Patient Instructions (Signed)
Shingrix is the new shingles shot, 2 shots over 2-6 months  Covid booster annually  Protein every 4 hours or so  60-80 ounces of fluids daily 10 ounces every 1-2 hours

## 2022-12-24 ENCOUNTER — Encounter: Payer: Self-pay | Admitting: *Deleted

## 2022-12-25 LAB — DRUG MONITORING PANEL 376104, URINE
Amphetamines: NEGATIVE ng/mL (ref <500)
Barbiturates: NEGATIVE ng/mL (ref <300)
Benzodiazepines: NEGATIVE ng/mL (ref <100)
Cocaine Metabolite: NEGATIVE ng/mL (ref <150)
Desmethyltramadol: NEGATIVE ng/mL (ref <100)
Opiates: NEGATIVE ng/mL (ref <100)
Oxycodone: NEGATIVE ng/mL (ref <100)
Tramadol: NEGATIVE ng/mL (ref <100)

## 2022-12-25 LAB — DM TEMPLATE

## 2023-01-24 ENCOUNTER — Other Ambulatory Visit: Payer: Self-pay | Admitting: Family Medicine

## 2023-01-28 ENCOUNTER — Telehealth: Payer: Self-pay | Admitting: Family Medicine

## 2023-01-28 NOTE — Telephone Encounter (Signed)
Joselyn Glassman, pharmacist with [facility] called to seek approval for manufacturer change of levothyroxine . All [facility] have changed to Allergen. Please call him back at [phone].

## 2023-01-28 NOTE — Telephone Encounter (Signed)
Called patient and she gave verbal consent of the manufacturer change of  Levothyroxine. Called pharmacy and spoke with Dondra Prader and confirmed change

## 2023-02-10 ENCOUNTER — Telehealth: Payer: Self-pay | Admitting: Cardiology

## 2023-02-10 NOTE — Telephone Encounter (Signed)
Patient identification verified by 2 forms. Shade Flood, RN     Called and spoke to patient   Patient has bilateral leg edema with warmth and pink tint to legs. Denies pain in legs. Audible wheezing when speaking to patient on the phone. Patient informed me she is not taking LASIX as prescribed. Sometimes takes two pills and sometimes takes one pill depending on schedule for the day. Patient not weighing herself daily. Denies recent change in diet or travel.             Interventions/Plan: Per DOD consult, patient should take medications as prescribed (40mg  total daily) for next three days, weigh herself daily and then call us with an update on Monday 12/23. Patient also scheduled for f/u appt with APP.    Reviewed ED warning signs/precautions  Patient agrees with plan, no questions at this time

## 2023-02-10 NOTE — Telephone Encounter (Signed)
Patient states her leg has been retaining fluid and has a pinkish color to it.   Pt c/o swelling: STAT is pt has developed SOB within 24 hours  How much weight have you gained and in what time span? Feel like she gain weight but unsure of how much  If swelling, where is the swelling located? Legs (have a pinkish color to it)  Are you currently taking a fluid pill? yes  Are you currently SOB? yes  Do you have a log of your daily weights (if so, list)?    Have you gained 3 pounds in a day or 5 pounds in a week? Unsure   Have you traveled recently? no

## 2023-02-10 NOTE — Telephone Encounter (Signed)
Patient returned RN's call and apologized for her dropped call.

## 2023-02-20 NOTE — Progress Notes (Deleted)
Cardiology Office Note:    Date:  02/20/2023   ID:  Alyssa Williamson, DOB Oct 17, 1931, MRN [id number]  PCP:  Bradd Canary, MD   [facility] Providers Cardiologist:  [name], MD { Click to update primary MD,subspecialty MD or APP then REFRESH:1}    Referring MD: Bradd Canary, MD   No chief complaint on file. ***  History of Present Illness:    Alyssa Williamson is a 89 y.o. female with a hx of grade 2 DD, paroxysmal atrial fibrillation with recurrent A-fib on chronic anticoagulation, hypertension, and hyperlipidemia. She is on verapamil for rate control.  She was seen by Dr. Jens Som 07/07/2022 and was back in A-fib.  Rate control was pursued at that time.  Lasix was adjusted by pulmonology for wheezing.  Plan was to rate control unless she was symptomatic and then could trial cardioversion.  He suspected she would need an antiarrhythmic to maintain sinus rhythm.  She returns today for evaluation of shortness of breath and lower extremity edema.   Needs EKG    Paroxysmal atrial fibrillation -?  Persistent - Rate controlled with verapamil   Chronic anticoagulation - Appropriately dosed on 5 mg Eliquis twice daily   Hypertension - Controlled on current 40 mg verapamil   Hyperlipidemia - 40 mg Lipitor   Chronic diastolic heart failure - 40 mg Lasix    Past Medical History:  Diagnosis Date   Allergic rhinitis    Anemia    Anxiety    Arthritis    bilateral knee   Atrial fibrillation (HCC) 12/21/2016   Cervical cancer screening 10/30/2013   Menarche at 15 Regular and moderate flow No history of abnormal pap in past G4P3, s/p 3 svd and 1 MC No history of abnormal MGM Noconcerns today No gyn surgeries   Constipation 11/18/2015   Depression with anxiety    Diverticulitis    Heart murmur    Hyperlipidemia    Hypertension    Hypothyroid    Left knee DJD    Medicare annual wellness visit, subsequent 10/30/2013   PONV (postoperative nausea and vomiting)     Preventative health care 05/20/2016    Past Surgical History:  Procedure Laterality Date   ABDOMINAL HYSTERECTOMY     bladder tack  04/2010   Dr Marciano Sequin   ERCP N/A 07/16/2020   Procedure: ENDOSCOPIC RETROGRADE CHOLANGIOPANCREATOGRAPHY (ERCP);  Surgeon: Kerin Salen, MD;  Location: [facility];  Service: Gastroenterology;  Laterality: N/A;   GANGLION CYST EXCISION     JOINT REPLACEMENT Right    REMOVAL OF STONES  07/16/2020   Procedure: REMOVAL OF STONES;  Surgeon: Kerin Salen, MD;  Location: [facility];  Service: Gastroenterology;;   REPLACEMENT TOTAL KNEE  10/2010   right knee-Murphy    SPHINCTEROTOMY  07/16/2020   Procedure: SPHINCTEROTOMY;  Surgeon: Kerin Salen, MD;  Location: [facility];  Service: Gastroenterology;;   TONSILLECTOMY     TOTAL KNEE ARTHROPLASTY Left 07/26/2012   Procedure: TOTAL KNEE ARTHROPLASTY;  Surgeon: Loreta Ave, MD;  Location: [facility] OR;  Service: Orthopedics;  Laterality: Left;    Current Medications: No outpatient medications have been marked as taking for the 02/22/23 encounter (Appointment) with Marcelino Duster, PA.     Allergies:   Cefdinir and Codeine   Social History   Socioeconomic History   Marital status: Widowed    Spouse name: Not on file   Number of children: 3   Years of education: Not on file  Highest education level: Not on file  Occupational History   Not on file  Tobacco Use   Smoking status: Never   Smokeless tobacco: Never  Vaping Use   Vaping status: Never Used  Substance and Sexual Activity   Alcohol use: No   Drug use: No   Sexual activity: Not on file  Other Topics Concern   Not on file  Social History Narrative   Not on file   Social Drivers of Health   Financial Resource Strain: Low Risk  (02/11/2022)   Overall Financial Resource Strain (CARDIA)    Difficulty of Paying Living Expenses: Not hard at all  Food Insecurity: No Food Insecurity (02/11/2022)   Hunger Vital Sign    Worried About  Running Out of Food in the Last Year: Never true    Ran Out of Food in the Last Year: Never true  Transportation Needs: No Transportation Needs (02/11/2022)   PRAPARE - Administrator, Civil Service (Medical): No    Lack of Transportation (Non-Medical): No  Physical Activity: Inactive (02/11/2022)   Exercise Vital Sign    Days of Exercise per Week: 0 days    Minutes of Exercise per Session: 0 min  Stress: No Stress Concern Present (02/11/2022)   [employer] of Occupational Health - Occupational Stress Questionnaire    Feeling of Stress : Not at all  Social Connections: Moderately Integrated (02/11/2022)   Social Connection and Isolation Panel [NHANES]    Frequency of Communication with Friends and Family: More than three times a week    Frequency of Social Gatherings with Friends and Family: Once a week    Attends Religious Services: More than 4 times per year    Active Member of [employer] or Organizations: Yes    Attends Banker Meetings: More than 4 times per year    Marital Status: Widowed     Family History: The patient's ***family history includes Alcohol abuse in her brother and father; Arthritis in her daughter; Breast cancer in her sister; Cancer in her brother, brother, brother, daughter, sister, sister, and sister; Diabetes in her daughter; Diverticulosis in her sister; Glaucoma in her brother, maternal grandfather, and sister; Heart failure in her mother; Mental illness in her daughter. There is no history of Colon cancer or Hypertension.  ROS:   Please see the history of present illness.    *** All other systems reviewed and are negative.  EKGs/Labs/Other Studies Reviewed:    The following studies were reviewed today: ***      Recent Labs: 12/23/2022: ALT 31; BUN 18; Creatinine, Ser 1.08; Hemoglobin 14.5; Platelets 174.0; Potassium 4.3; Sodium 140; TSH 2.04  Recent Lipid Panel    Component Value Date/Time   CHOL 160 12/23/2022 1231    CHOL 146 12/01/2020 0832   TRIG 78.0 12/23/2022 1231   HDL 68.50 12/23/2022 1231   HDL 65 12/01/2020 0832   CHOLHDL 2 12/23/2022 1231   VLDL 15.6 12/23/2022 1231   LDLCALC 76 12/23/2022 1231   LDLCALC 70 12/01/2020 0832   LDLCALC 75 12/25/2019 1208     Risk Assessment/Calculations:   {Does this patient have ATRIAL FIBRILLATION?:[phone]}  No BP recorded.  {Refresh Note OR Click here to enter BP  :1}***         Physical Exam:    VS:  There were no vitals taken for this visit.    Wt Readings from Last 3 Encounters:  12/23/22 179 lb 12.8 oz (81.6 kg)  10/23/22 181  lb (82.1 kg)  08/12/22 181 lb 3.2 oz (82.2 kg)     GEN: *** Well nourished, well developed in no acute distress HEENT: Normal NECK: No JVD; No carotid bruits LYMPHATICS: No lymphadenopathy CARDIAC: ***RRR, no murmurs, rubs, gallops RESPIRATORY:  Clear to auscultation without rales, wheezing or rhonchi  ABDOMEN: Soft, non-tender, non-distended MUSCULOSKELETAL:  No edema; No deformity  SKIN: Warm and dry NEUROLOGIC:  Alert and oriented x 3 PSYCHIATRIC:  Normal affect   ASSESSMENT:    No diagnosis found. PLAN:    In order of problems listed above:  ***      {Are you ordering a CV Procedure (e.g. stress test, cath, DCCV, TEE, etc)?   Press F2        :[id number]}    Medication Adjustments/Labs and Tests Ordered: Current medicines are reviewed at length with the patient today.  Concerns regarding medicines are outlined above.  No orders of the defined types were placed in this encounter.  No orders of the defined types were placed in this encounter.   There are no Patient Instructions on file for this visit.   Signed, Marcelino Duster, [place]  02/20/2023 12:46 PM    [facility]

## 2023-02-21 ENCOUNTER — Encounter: Payer: Self-pay | Admitting: Nurse Practitioner

## 2023-02-21 ENCOUNTER — Ambulatory Visit: Payer: Medicare Other | Attending: Nurse Practitioner | Admitting: Nurse Practitioner

## 2023-02-21 VITALS — BP 124/62 | HR 88 | Ht 62.0 in | Wt 192.0 lb

## 2023-02-21 DIAGNOSIS — I509 Heart failure, unspecified: Secondary | ICD-10-CM | POA: Diagnosis not present

## 2023-02-21 DIAGNOSIS — E7800 Pure hypercholesterolemia, unspecified: Secondary | ICD-10-CM

## 2023-02-21 DIAGNOSIS — I4819 Other persistent atrial fibrillation: Secondary | ICD-10-CM

## 2023-02-21 DIAGNOSIS — I10 Essential (primary) hypertension: Secondary | ICD-10-CM

## 2023-02-21 MED ORDER — POTASSIUM CHLORIDE CRYS ER 20 MEQ PO TBCR: 20.0000 meq | EXTENDED_RELEASE_TABLET | Freq: Every day | ORAL | 6 refills | Status: AC

## 2023-02-21 NOTE — Progress Notes (Signed)
Cardiology Office Note:    Date:  02/21/2023   ID:  Alyssa Williamson, DOB August 02, 1931, MRN [id number]  PCP:  Bradd Canary, MD   [facility] Providers Cardiologist:  [name], MD     Referring MD: Bradd Canary, MD   No chief complaint on file. Shortness of breath and lower extremity edema  History of Present Illness:    Alyssa Williamson is a 89 y.o. female with a hx of grade 2 DD, paroxysmal atrial fibrillation with recurrent A-fib on chronic anticoagulation, hypertension, and hyperlipidemia. She is on verapamil for rate control.  She was seen by Dr. Jens Som 07/07/2022 and was back in A-fib.  Rate control was pursued at that time.  Lasix was adjusted by pulmonology for wheezing.    She returns today for evaluation of shortness of breath and lower extremity edema.  He contacted our office on 02/10/2023 with complaint of shortness of breath. Per DOD patient was advised to 40 mg total of Lasix x 3 days advisement to contact our office on 12/23 for update.    The patient, with a history of heart failure, presents with increased shortness of breath and swelling. She first noticed these symptoms on December 19th and has been taking Lasix 40mg  daily since then. Despite this, she has gained approximately 13 pounds in the past six weeks. She reports no changes in her diet and has been mindful of her salt intake since noticing the swelling. She also reports a decrease in food intake.In addition to the swelling and shortness of breath, the patient also reports a feeling of stuffiness in her head, which she believes may be contributing to her symptoms. She has not taken any cough or cold medications recently.  She is euvolemic on examination today no acute cardiac complaints noted.  Past Medical History:  Diagnosis Date   Allergic rhinitis    Anemia    Anxiety    Arthritis    bilateral knee   Atrial fibrillation (HCC) 12/21/2016   Cervical cancer screening 10/30/2013   Menarche at 15  Regular and moderate flow No history of abnormal pap in past G4P3, s/p 3 svd and 1 MC No history of abnormal MGM Noconcerns today No gyn surgeries   Constipation 11/18/2015   Depression with anxiety    Diverticulitis    Heart murmur    Hyperlipidemia    Hypertension    Hypothyroid    Left knee DJD    Medicare annual wellness visit, subsequent 10/30/2013   PONV (postoperative nausea and vomiting)    Preventative health care 05/20/2016    Past Surgical History:  Procedure Laterality Date   ABDOMINAL HYSTERECTOMY     bladder tack  04/2010   Dr Marciano Sequin   ERCP N/A 07/16/2020   Procedure: ENDOSCOPIC RETROGRADE CHOLANGIOPANCREATOGRAPHY (ERCP);  Surgeon: Kerin Salen, MD;  Location: [facility];  Service: Gastroenterology;  Laterality: N/A;   GANGLION CYST EXCISION     JOINT REPLACEMENT Right    REMOVAL OF STONES  07/16/2020   Procedure: REMOVAL OF STONES;  Surgeon: Kerin Salen, MD;  Location: [facility];  Service: Gastroenterology;;   REPLACEMENT TOTAL KNEE  10/2010   right knee-Murphy    SPHINCTEROTOMY  07/16/2020   Procedure: SPHINCTEROTOMY;  Surgeon: Kerin Salen, MD;  Location: [facility];  Service: Gastroenterology;;   TONSILLECTOMY     TOTAL KNEE ARTHROPLASTY Left 07/26/2012   Procedure: TOTAL KNEE ARTHROPLASTY;  Surgeon: Loreta Ave, MD;  Location: [facility] OR;  Service:  Orthopedics;  Laterality: Left;    Current Medications: Current Meds  Medication Sig   acetaminophen (TYLENOL) 325 MG tablet Take 2 tablets (650 mg total) by mouth every 6 (six) hours as needed for mild pain (or Fever >/= 101).   albuterol (VENTOLIN HFA) 108 (90 Base) MCG/ACT inhaler Inhale 1 puff into the lungs every 6 (six) hours as needed for wheezing or shortness of breath.   ALPRAZolam (XANAX) 0.25 MG tablet Take 1 tablet (0.25 mg total) by mouth daily as needed for anxiety.   apixaban (ELIQUIS) 5 MG TABS tablet Take 1 tablet (5 mg total) by mouth 2 (two) times daily. Please keep upcoming appointment.  Thanks.   atorvastatin (LIPITOR) 40 MG tablet TAKE 1 TABLET BY MOUTH ONCE DAILY APPT  NEEDED  FOR  FUTURE  REFILLS   Azelastine HCl 137 MCG/SPRAY SOLN Place 1 spray into both nostrils daily.   cefdinir (OMNICEF) 300 MG capsule Take 1 capsule (300 mg total) by mouth 2 (two) times daily.   citalopram (CELEXA) 20 MG tablet Take 1 tablet (20 mg total) by mouth daily.   Cranberry 500 MG CAPS Take 500 mg by mouth daily.   fluticasone (FLONASE) 50 MCG/ACT nasal spray Place 2 sprays into both nostrils daily.   furosemide (LASIX) 20 MG tablet Take 2 tablets (40 mg total) by mouth daily.   levothyroxine (SYNTHROID) 75 MCG tablet TAKE 1 TABLET BY MOUTH ONCE DAILY BEFORE BREAKFAST   loratadine (CLARITIN) 10 MG tablet Take 10 mg by mouth daily.   Multiple Vitamins-Minerals (CENTRUM SILVER PO) Take 1 tablet by mouth daily.   potassium chloride SA (KLOR-CON M) 20 MEQ tablet Take 1 tablet (20 mEq total) by mouth daily.   verapamil (CALAN-SR) 240 MG CR tablet Take 1 tablet (240 mg total) by mouth daily.     Allergies:   Cefdinir and Codeine   Social History   Socioeconomic History   Marital status: Widowed    Spouse name: Not on file   Number of children: 3   Years of education: Not on file   Highest education level: Not on file  Occupational History   Not on file  Tobacco Use   Smoking status: Never   Smokeless tobacco: Never  Vaping Use   Vaping status: Never Used  Substance and Sexual Activity   Alcohol use: No   Drug use: No   Sexual activity: Not on file  Other Topics Concern   Not on file  Social History Narrative   Not on file   Social Drivers of Health   Financial Resource Strain: Low Risk  (02/11/2022)   Overall Financial Resource Strain (CARDIA)    Difficulty of Paying Living Expenses: Not hard at all  Food Insecurity: No Food Insecurity (02/11/2022)   Hunger Vital Sign    Worried About Running Out of Food in the Last Year: Never true    Ran Out of Food in the Last Year:  Never true  Transportation Needs: No Transportation Needs (02/11/2022)   PRAPARE - Administrator, Civil Service (Medical): No    Lack of Transportation (Non-Medical): No  Physical Activity: Inactive (02/11/2022)   Exercise Vital Sign    Days of Exercise per Week: 0 days    Minutes of Exercise per Session: 0 min  Stress: No Stress Concern Present (02/11/2022)   [employer] of Occupational Health - Occupational Stress Questionnaire    Feeling of Stress : Not at all  Social Connections: Moderately Integrated (02/11/2022)  Social Advertising account executive [NHANES]    Frequency of Communication with Friends and Family: More than three times a week    Frequency of Social Gatherings with Friends and Family: Once a week    Attends Religious Services: More than 4 times per year    Active Member of [employer] or Organizations: Yes    Attends Banker Meetings: More than 4 times per year    Marital Status: Widowed     Family History: The patient's family history includes Alcohol abuse in her brother and father; Arthritis in her daughter; Breast cancer in her sister; Cancer in her brother, brother, brother, daughter, sister, sister, and sister; Diabetes in her daughter; Diverticulosis in her sister; Glaucoma in her brother, maternal grandfather, and sister; Heart failure in her mother; Mental illness in her daughter. There is no history of Colon cancer or Hypertension.  ROS:   Please see the history of present illness.     All other systems reviewed and are negative.  EKGs/Labs/Other Studies Reviewed:    The following studies were reviewed today: EKG results showing atrial fibrillation and controlled rate of 70 bpm with no acute changes EKG Interpretation Date/Time:  Monday February 21 2023 12:01:39 EST Ventricular Rate:  70 PR Interval:    QRS Duration:  114 QT Interval:  420 QTC Calculation: 453 R Axis:   21  Text Interpretation: Atrial fibrillation Low  voltage QRS Right bundle branch block When compared with ECG of 14-Jul-2020 21:00, Atrial fibrillation has replaced Junctional rhythm Right bundle branch block is now Present Confirmed by Robin Searing [phone]) on 02/21/2023 12:33:43 PM Recent Labs: 12/23/2022: ALT 31; BUN 18; Creatinine, Ser 1.08; Hemoglobin 14.5; Platelets 174.0; Potassium 4.3; Sodium 140; TSH 2.04  Recent Lipid Panel    Component Value Date/Time   CHOL 160 12/23/2022 1231   CHOL 146 12/01/2020 0832   TRIG 78.0 12/23/2022 1231   HDL 68.50 12/23/2022 1231   HDL 65 12/01/2020 0832   CHOLHDL 2 12/23/2022 1231   VLDL 15.6 12/23/2022 1231   LDLCALC 76 12/23/2022 1231   LDLCALC 70 12/01/2020 0832   LDLCALC 75 12/25/2019 1208     Risk Assessment/Calculations:    CHA2DS2-VASc Score =     This indicates a  % annual risk of stroke. The patient's score is based upon:                Physical Exam:    VS:  BP 124/62   Pulse 88   Ht 5\' 2"  (1.575 m)   Wt 192 lb (87.1 kg)   SpO2 92%   BMI 35.12 kg/m     Wt Readings from Last 3 Encounters:  02/21/23 192 lb (87.1 kg)  12/23/22 179 lb 12.8 oz (81.6 kg)  10/23/22 181 lb (82.1 kg)     GEN:  Well nourished, well developed in no acute distress HEENT: Normal NECK: No JVD; No carotid bruits LYMPHATICS: No lymphadenopathy with +2 bilateral lower extremity edema present CARDIAC: RRR, no murmurs, rubs, gallops euvolemic on exam RESPIRATORY:  Clear to auscultation without rales, wheezing or rhonchi  ABDOMEN: Soft, non-tender, non-distended MUSCULOSKELETAL:  No edema; No deformity  SKIN: Warm and dry NEUROLOGIC:  Alert and oriented x 3 PSYCHIATRIC:  Normal affect   ASSESSMENT:    1. Persistent atrial fibrillation (HCC)   2. Essential hypertension   3. Congestive heart failure, unspecified HF chronicity, unspecified heart failure type (HCC)   4. Pure hypercholesterolemia    PLAN:  1.  Persistent atrial fibrillation - Rate controlled with verapamil -Continue  Eliquis 5 mg twice daily  2. Chronic anticoagulation - Appropriately dosed on 5 mg Eliquis twice daily   3. Hypertension - Controlled on current 40 mg verapamil with BP stable at 124/62   4. Hyperlipidemia - 40 mg Lipitor   5. Chronic diastolic heart failure Increased shortness of breath and weight gain of 13 pounds over the past 6 weeks. Patient has been taking Lasix 40mg  inconsistently due to inconvenience. Patient has been more compliant with Lasix since December 19th. -Check renal function and potassium levels today due to increased Lasix use. -If labs are normal, increase Lasix to 40mg  twice daily for 3 days, then return to regular dose of 40mg  daily. -Prescribe potassium supplement due to increased Lasix use. -Advise patient to monitor weight and blood pressure daily. -Provide prescription for a scale for patient to monitor weight at home. -Encourage patient to increase fluid intake to at least 64 ounces daily, with at least half being water. -Follow up with Dr. Jens Som as scheduled.   Medication Adjustments/Labs and Tests Ordered: Current medicines are reviewed at length with the patient today.  Concerns regarding medicines are outlined above.  Orders Placed This Encounter  Procedures   Basic Metabolic Panel (BMET)   EKG 12-Lead   Meds ordered this encounter  Medications   potassium chloride SA (KLOR-CON M) 20 MEQ tablet    Sig: Take 1 tablet (20 mEq total) by mouth daily.    Dispense:  30 tablet    Refill:  6    Patient Instructions  Medication Instructions:   INCREASE Lasix two (2) tablets by mouth ( 40 mg)  twice daily X 3 days than go back to two ( 2) tablets (40 mg ) daily.   START K-dur (potassium) one (1) tablet by mouth ( 20 mEq) daily.  Will let you know when to start.   *If you need a refill on your cardiac medications before your next appointment, please call your pharmacy*   Lab Work:   TODAY!!!! BMET  If you have labs (blood work) drawn  today and your tests are completely normal, you will receive your results only by: MyChart Message (if you have MyChart) OR A paper copy in the mail If you have any lab test that is abnormal or we need to change your treatment, we will call you to review the results.   Testing/Procedures:  None ordered.   Follow-Up: At [facility], you and your health needs are our priority.  As part of our continuing mission to provide you with exceptional heart care, we have created designated Provider Care Teams.  These Care Teams include your primary Cardiologist (physician) and Advanced Practice Providers (APPs -  Physician Assistants and Nurse Practitioners) who all work together to provide you with the care you need, when you need it.  We recommend signing up for the patient portal called "MyChart".  Sign up information is provided on this After Visit Summary.  MyChart is used to connect with patients for Virtual Visits (Telemedicine).  Patients are able to view lab/test results, encounter notes, upcoming appointments, etc.  Non-urgent messages can be sent to your provider as well.   To learn more about what you can do with MyChart, go to [email or web address].    Your next appointment:   6 month(s)  Provider:   [name], MD     Other Instructions  HOW TO TAKE YOUR BLOOD PRESSURE  Rest 5  minutes before taking your blood pressure. Don't  smoke or drink caffeinated beverages for at least 30 minutes before. Take your blood pressure before (not after) you eat. Sit comfortably with your back supported and both feet on the floor ( don't cross your legs). Elevate your arm to heart level on a table or a desk. Use the proper sized cuff.  It should fit smoothly and snugly around your bare upper arm.  There should be  Enough room to slip a fingertip under the cuff.  The bottom edge of the cuff should be 1 inch above the crease Of the elbow. Please monitor your blood pressure once  daily 2 hours after your am medication. If you blood pressure Consistently remains above 130 (systolic) top number or over 80 ( diastolic) bottom number X 3 days  Consecutively.  Please call our office at [phone] or send Mychart message.     ----Avoid cold medicines with D or DM at the end of them----  DASH Eating Plan DASH stands for Dietary Approaches to Stop Hypertension. The DASH eating plan is a healthy eating plan that has been shown to: Lower high blood pressure (hypertension). Reduce your risk for type 2 diabetes, heart disease, and stroke. Help with weight loss. What are tips for following this plan? Reading food labels Check food labels for the amount of salt (sodium) per serving. Choose foods with less than 5 percent of the Daily Value (DV) of sodium. In general, foods with less than 300 milligrams (mg) of sodium per serving fit into this eating plan. To find whole grains, look for the word "whole" as the first word in the ingredient list. Shopping Buy products labeled as "low-sodium" or "no salt added." Buy fresh foods. Avoid canned foods and pre-made or frozen meals. Cooking Try not to add salt when you cook. Use salt-free seasonings or herbs instead of table salt or sea salt. Check with your health care provider or pharmacist before using salt substitutes. Do not fry foods. Cook foods in healthy ways, such as baking, boiling, grilling, roasting, or broiling. Cook using oils that are good for your heart. These include olive, canola, avocado, soybean, and sunflower oil. Meal planning  Eat a balanced diet. This should include: 4 or more servings of fruits and 4 or more servings of vegetables each day. Try to fill half of your plate with fruits and vegetables. 6-8 servings of whole grains each day. 6 or less servings of lean meat, poultry, or fish each day. 1 oz is 1 serving. A 3 oz (85 g) serving of meat is about the same size as the palm of your hand. One egg is 1 oz (28  g). 2-3 servings of low-fat dairy each day. One serving is 1 cup (237 mL). 1 serving of nuts, seeds, or beans 5 times each week. 2-3 servings of heart-healthy fats. Healthy fats called omega-3 fatty acids are found in foods such as walnuts, flaxseeds, fortified milks, and eggs. These fats are also found in cold-water fish, such as sardines, salmon, and mackerel. Limit how much you eat of: Canned or prepackaged foods. Food that is high in trans fat, such as fried foods. Food that is high in saturated fat, such as fatty meat. Desserts and other sweets, sugary drinks, and other foods with added sugar. Full-fat dairy products. Do not salt foods before eating. Do not eat more than 4 egg yolks a week. Try to eat at least 2 vegetarian meals a week. Eat more home-cooked food  and less restaurant, buffet, and fast food. Lifestyle When eating at a restaurant, ask if your food can be made with less salt or no salt. If you drink alcohol: Limit how much you have to: 0-1 drink a day if you are female. 0-2 drinks a day if you are female. Know how much alcohol is in your drink. In the [place] one drink is one 12 oz bottle of beer (355 mL), one 5 oz glass of wine (148 mL), or one 1 oz glass of hard liquor (44 mL). General information Avoid eating more than 2,300 mg of salt a day. If you have hypertension, you may need to reduce your sodium intake to 1,500 mg a day. Work with your provider to stay at a healthy body weight or lose weight. Ask what the best weight range is for you. On most days of the week, get at least 30 minutes of exercise that causes your heart to beat faster. This may include walking, swimming, or biking. Work with your provider or dietitian to adjust your eating plan to meet your specific calorie needs. What foods should I eat? Fruits All fresh, dried, or frozen fruit. Canned fruits that are in their natural juice and do not have sugar added to them. Vegetables Fresh or frozen vegetables  that are raw, steamed, roasted, or grilled. Low-sodium or reduced-sodium tomato and vegetable juice. Low-sodium or reduced-sodium tomato sauce and tomato paste. Low-sodium or reduced-sodium canned vegetables. Grains Whole-grain or whole-wheat bread. Whole-grain or whole-wheat pasta. Brown rice. Orpah Cobb. Bulgur. Whole-grain and low-sodium cereals. Pita bread. Low-fat, low-sodium crackers. Whole-wheat flour tortillas. Meats and other proteins Skinless chicken or [place]. Ground chicken or [place]. Pork with fat trimmed off. Fish and seafood. Egg whites. Dried beans, peas, or lentils. Unsalted nuts, nut butters, and seeds. Unsalted canned beans. Lean cuts of beef with fat trimmed off. Low-sodium, lean precooked or cured meat, such as sausages or meat loaves. Dairy Low-fat (1%) or fat-free (skim) milk. Reduced-fat, low-fat, or fat-free cheeses. Nonfat, low-sodium ricotta or cottage cheese. Low-fat or nonfat yogurt. Low-fat, low-sodium cheese. Fats and oils Soft margarine without trans fats. Vegetable oil. Reduced-fat, low-fat, or light mayonnaise and salad dressings (reduced-sodium). Canola, safflower, olive, avocado, soybean, and sunflower oils. Avocado. Seasonings and condiments Herbs. Spices. Seasoning mixes without salt. Other foods Unsalted popcorn and pretzels. Fat-free sweets. The items listed above may not be all the foods and drinks you can have. Talk to a dietitian to learn more. What foods should I avoid? Fruits Canned fruit in a light or heavy syrup. Fried fruit. Fruit in cream or butter sauce. Vegetables Creamed or fried vegetables. Vegetables in a cheese sauce. Regular canned vegetables that are not marked as low-sodium or reduced-sodium. Regular canned tomato sauce and paste that are not marked as low-sodium or reduced-sodium. Regular tomato and vegetable juices that are not marked as low-sodium or reduced-sodium. Rosita Fire. Olives. Grains Baked goods made with fat, such as  croissants, muffins, or some breads. Dry pasta or rice meal packs. Meats and other proteins Fatty cuts of meat. Ribs. Fried meat. Tomasa Blase. Bologna, salami, and other precooked or cured meats, such as sausages or meat loaves, that are not lean and low in sodium. Fat from the back of a pig (fatback). Bratwurst. Salted nuts and seeds. Canned beans with added salt. Canned or smoked fish. Whole eggs or egg yolks. Chicken or [place] with skin. Dairy Whole or 2% milk, cream, and half-and-half. Whole or full-fat cream cheese. Whole-fat or sweetened yogurt. Full-fat cheese.  Nondairy creamers. Whipped toppings. Processed cheese and cheese spreads. Fats and oils Butter. Stick margarine. Lard. Shortening. Ghee. Bacon fat. Tropical oils, such as coconut, palm kernel, or palm oil. Seasonings and condiments Onion salt, garlic salt, seasoned salt, table salt, and sea salt. Worcestershire sauce. Tartar sauce. Barbecue sauce. Teriyaki sauce. Soy sauce, including reduced-sodium soy sauce. Steak sauce. Canned and packaged gravies. Fish sauce. Oyster sauce. Cocktail sauce. Store-bought horseradish. Ketchup. Mustard. Meat flavorings and tenderizers. Bouillon cubes. Hot sauces. Pre-made or packaged marinades. Pre-made or packaged taco seasonings. Relishes. Regular salad dressings. Other foods Salted popcorn and pretzels. The items listed above may not be all the foods and drinks you should avoid. Talk to a dietitian to learn more. Where to find more information National Heart, Lung, and Blood Institute (NHLBI): BuffaloDryCleaner.gl American Heart Association (AHA): heart.org Academy of Nutrition and Dietetics: eatright.org National Kidney Foundation (NKF): kidney.org This information is not intended to replace advice given to you by your health care provider. Make sure you discuss any questions you have with your health care provider. Document Revised: 02/25/2022 Document Reviewed: 02/25/2022 Elsevier Patient Education  2024  Elsevier Inc.  Recommend weighing daily and keeping a log.   Date  Time Weight                                                         Signed, Napoleon Form, Leodis Rains, NP  02/21/2023 12:39 PM    [facility]

## 2023-02-21 NOTE — Patient Instructions (Addendum)
Medication Instructions:   INCREASE Lasix two (2) tablets by mouth ( 40 mg)  twice daily X 3 days than go back to two ( 2) tablets (40 mg ) daily.   START K-dur (potassium) one (1) tablet by mouth ( 20 mEq) daily.  Will let you know when to start.   *If you need a refill on your cardiac medications before your next appointment, please call your pharmacy*   Lab Work:   TODAY!!!! BMET  If you have labs (blood work) drawn today and your tests are completely normal, you will receive your results only by: MyChart Message (if you have MyChart) OR A paper copy in the mail If you have any lab test that is abnormal or we need to change your treatment, we will call you to review the results.   Testing/Procedures:  None ordered.   Follow-Up: At [facility], you and your health needs are our priority.  As part of our continuing mission to provide you with exceptional heart care, we have created designated Provider Care Teams.  These Care Teams include your primary Cardiologist (physician) and Advanced Practice Providers (APPs -  Physician Assistants and Nurse Practitioners) who all work together to provide you with the care you need, when you need it.  We recommend signing up for the patient portal called "MyChart".  Sign up information is provided on this After Visit Summary.  MyChart is used to connect with patients for Virtual Visits (Telemedicine).  Patients are able to view lab/test results, encounter notes, upcoming appointments, etc.  Non-urgent messages can be sent to your provider as well.   To learn more about what you can do with MyChart, go to [email or web address].    Your next appointment:   6 month(s)  Provider:   [name], MD     Other Instructions  HOW TO TAKE YOUR BLOOD PRESSURE  Rest 5 minutes before taking your blood pressure. Don't  smoke or drink caffeinated beverages for at least 30 minutes before. Take your blood pressure before (not after) you  eat. Sit comfortably with your back supported and both feet on the floor ( don't cross your legs). Elevate your arm to heart level on a table or a desk. Use the proper sized cuff.  It should fit smoothly and snugly around your bare upper arm.  There should be  Enough room to slip a fingertip under the cuff.  The bottom edge of the cuff should be 1 inch above the crease Of the elbow. Please monitor your blood pressure once daily 2 hours after your am medication. If you blood pressure Consistently remains above 130 (systolic) top number or over 80 ( diastolic) bottom number X 3 days  Consecutively.  Please call our office at [phone] or send Mychart message.     ----Avoid cold medicines with D or DM at the end of them----  DASH Eating Plan DASH stands for Dietary Approaches to Stop Hypertension. The DASH eating plan is a healthy eating plan that has been shown to: Lower high blood pressure (hypertension). Reduce your risk for type 2 diabetes, heart disease, and stroke. Help with weight loss. What are tips for following this plan? Reading food labels Check food labels for the amount of salt (sodium) per serving. Choose foods with less than 5 percent of the Daily Value (DV) of sodium. In general, foods with less than 300 milligrams (mg) of sodium per serving fit into this eating plan. To find whole grains, look for the  word "whole" as the first word in the ingredient list. Shopping Buy products labeled as "low-sodium" or "no salt added." Buy fresh foods. Avoid canned foods and pre-made or frozen meals. Cooking Try not to add salt when you cook. Use salt-free seasonings or herbs instead of table salt or sea salt. Check with your health care provider or pharmacist before using salt substitutes. Do not fry foods. Cook foods in healthy ways, such as baking, boiling, grilling, roasting, or broiling. Cook using oils that are good for your heart. These include olive, canola, avocado, soybean,  and sunflower oil. Meal planning  Eat a balanced diet. This should include: 4 or more servings of fruits and 4 or more servings of vegetables each day. Try to fill half of your plate with fruits and vegetables. 6-8 servings of whole grains each day. 6 or less servings of lean meat, poultry, or fish each day. 1 oz is 1 serving. A 3 oz (85 g) serving of meat is about the same size as the palm of your hand. One egg is 1 oz (28 g). 2-3 servings of low-fat dairy each day. One serving is 1 cup (237 mL). 1 serving of nuts, seeds, or beans 5 times each week. 2-3 servings of heart-healthy fats. Healthy fats called omega-3 fatty acids are found in foods such as walnuts, flaxseeds, fortified milks, and eggs. These fats are also found in cold-water fish, such as sardines, salmon, and mackerel. Limit how much you eat of: Canned or prepackaged foods. Food that is high in trans fat, such as fried foods. Food that is high in saturated fat, such as fatty meat. Desserts and other sweets, sugary drinks, and other foods with added sugar. Full-fat dairy products. Do not salt foods before eating. Do not eat more than 4 egg yolks a week. Try to eat at least 2 vegetarian meals a week. Eat more home-cooked food and less restaurant, buffet, and fast food. Lifestyle When eating at a restaurant, ask if your food can be made with less salt or no salt. If you drink alcohol: Limit how much you have to: 0-1 drink a day if you are female. 0-2 drinks a day if you are female. Know how much alcohol is in your drink. In the [place] one drink is one 12 oz bottle of beer (355 mL), one 5 oz glass of wine (148 mL), or one 1 oz glass of hard liquor (44 mL). General information Avoid eating more than 2,300 mg of salt a day. If you have hypertension, you may need to reduce your sodium intake to 1,500 mg a day. Work with your provider to stay at a healthy body weight or lose weight. Ask what the best weight range is for you. On most  days of the week, get at least 30 minutes of exercise that causes your heart to beat faster. This may include walking, swimming, or biking. Work with your provider or dietitian to adjust your eating plan to meet your specific calorie needs. What foods should I eat? Fruits All fresh, dried, or frozen fruit. Canned fruits that are in their natural juice and do not have sugar added to them. Vegetables Fresh or frozen vegetables that are raw, steamed, roasted, or grilled. Low-sodium or reduced-sodium tomato and vegetable juice. Low-sodium or reduced-sodium tomato sauce and tomato paste. Low-sodium or reduced-sodium canned vegetables. Grains Whole-grain or whole-wheat bread. Whole-grain or whole-wheat pasta. Brown rice. Orpah Cobb. Bulgur. Whole-grain and low-sodium cereals. Pita bread. Low-fat, low-sodium crackers. Whole-wheat flour tortillas.  Meats and other proteins Skinless chicken or [place]. Ground chicken or [place]. Pork with fat trimmed off. Fish and seafood. Egg whites. Dried beans, peas, or lentils. Unsalted nuts, nut butters, and seeds. Unsalted canned beans. Lean cuts of beef with fat trimmed off. Low-sodium, lean precooked or cured meat, such as sausages or meat loaves. Dairy Low-fat (1%) or fat-free (skim) milk. Reduced-fat, low-fat, or fat-free cheeses. Nonfat, low-sodium ricotta or cottage cheese. Low-fat or nonfat yogurt. Low-fat, low-sodium cheese. Fats and oils Soft margarine without trans fats. Vegetable oil. Reduced-fat, low-fat, or light mayonnaise and salad dressings (reduced-sodium). Canola, safflower, olive, avocado, soybean, and sunflower oils. Avocado. Seasonings and condiments Herbs. Spices. Seasoning mixes without salt. Other foods Unsalted popcorn and pretzels. Fat-free sweets. The items listed above may not be all the foods and drinks you can have. Talk to a dietitian to learn more. What foods should I avoid? Fruits Canned fruit in a light or heavy syrup. Fried  fruit. Fruit in cream or butter sauce. Vegetables Creamed or fried vegetables. Vegetables in a cheese sauce. Regular canned vegetables that are not marked as low-sodium or reduced-sodium. Regular canned tomato sauce and paste that are not marked as low-sodium or reduced-sodium. Regular tomato and vegetable juices that are not marked as low-sodium or reduced-sodium. Rosita Fire. Olives. Grains Baked goods made with fat, such as croissants, muffins, or some breads. Dry pasta or rice meal packs. Meats and other proteins Fatty cuts of meat. Ribs. Fried meat. Tomasa Blase. Bologna, salami, and other precooked or cured meats, such as sausages or meat loaves, that are not lean and low in sodium. Fat from the back of a pig (fatback). Bratwurst. Salted nuts and seeds. Canned beans with added salt. Canned or smoked fish. Whole eggs or egg yolks. Chicken or [place] with skin. Dairy Whole or 2% milk, cream, and half-and-half. Whole or full-fat cream cheese. Whole-fat or sweetened yogurt. Full-fat cheese. Nondairy creamers. Whipped toppings. Processed cheese and cheese spreads. Fats and oils Butter. Stick margarine. Lard. Shortening. Ghee. Bacon fat. Tropical oils, such as coconut, palm kernel, or palm oil. Seasonings and condiments Onion salt, garlic salt, seasoned salt, table salt, and sea salt. Worcestershire sauce. Tartar sauce. Barbecue sauce. Teriyaki sauce. Soy sauce, including reduced-sodium soy sauce. Steak sauce. Canned and packaged gravies. Fish sauce. Oyster sauce. Cocktail sauce. Store-bought horseradish. Ketchup. Mustard. Meat flavorings and tenderizers. Bouillon cubes. Hot sauces. Pre-made or packaged marinades. Pre-made or packaged taco seasonings. Relishes. Regular salad dressings. Other foods Salted popcorn and pretzels. The items listed above may not be all the foods and drinks you should avoid. Talk to a dietitian to learn more. Where to find more information National Heart, Lung, and Blood Institute  (NHLBI): BuffaloDryCleaner.gl American Heart Association (AHA): heart.org Academy of Nutrition and Dietetics: eatright.org National Kidney Foundation (NKF): kidney.org This information is not intended to replace advice given to you by your health care provider. Make sure you discuss any questions you have with your health care provider. Document Revised: 02/25/2022 Document Reviewed: 02/25/2022 Elsevier Patient Education  2024 Elsevier Inc.  Recommend weighing daily and keeping a log.   Date  Time Weight

## 2023-02-22 ENCOUNTER — Telehealth: Payer: Self-pay | Admitting: Cardiology

## 2023-02-22 ENCOUNTER — Ambulatory Visit: Payer: Medicare Other | Admitting: Physician Assistant

## 2023-02-22 LAB — BASIC METABOLIC PANEL
BUN/Creatinine Ratio: 15 (ref 12–28)
BUN: 15 mg/dL (ref 8–27)
CO2: 26 mmol/L (ref 20–29)
Calcium: 9.1 mg/dL (ref 8.7–10.3)
Chloride: 101 mmol/L (ref 96–106)
Creatinine, Ser: 1 mg/dL (ref 0.57–1.00)
Glucose: 90 mg/dL (ref 70–99)
Potassium: 4.1 mmol/L (ref 3.5–5.2)
Sodium: 140 mmol/L (ref 134–144)
eGFR: 54 mL/min/{1.73_m2} — ABNORMAL LOW (ref >59)

## 2023-02-22 NOTE — Telephone Encounter (Signed)
Pt returning nurses phone call. Please advise ?

## 2023-02-22 NOTE — Telephone Encounter (Signed)
 Called and spoke to patient. Verified name and DOB. Below message relayed per Jackee Alberts, NP. No questions at this time. Patient verbalized understanding and agree.  Please let patient know that her renal function and potassium are both normal.  She can increase Lasix  to 40 mg twice daily x 3 days then return to Lasix  20 mg twice daily as discussed at her follow-up visit yesterday.  Please also advise patient to weigh herself daily and to contact us  if she notices a weight gain of 5 pounds in 1 week or 2 pounds and 24 hours.   Jackee Alberts, NP

## 2023-02-23 DIAGNOSIS — H269 Unspecified cataract: Secondary | ICD-10-CM

## 2023-02-23 HISTORY — PX: CATARACT EXTRACTION: SUR2

## 2023-02-23 HISTORY — DX: Unspecified cataract: H26.9

## 2023-03-14 ENCOUNTER — Other Ambulatory Visit: Payer: Self-pay | Admitting: Cardiology

## 2023-03-14 DIAGNOSIS — I4891 Unspecified atrial fibrillation: Secondary | ICD-10-CM

## 2023-03-14 NOTE — Telephone Encounter (Signed)
Eliquis 5mg  refill request received. Patient is 89 years old, weight-87.1kg, Crea-1.00 on 02/21/23, Diagnosis-Afib, and last seen by Robin Searing on 02/21/23. Dose is appropriate based on dosing criteria. Will send in refill to requested pharmacy.

## 2023-03-29 ENCOUNTER — Other Ambulatory Visit: Payer: Self-pay | Admitting: Family Medicine

## 2023-03-29 MED ORDER — ALPRAZOLAM 0.25 MG PO TABS: 0.2500 mg | ORAL_TABLET | Freq: Every day | ORAL | 2 refills | Status: DC | PRN

## 2023-03-29 NOTE — Telephone Encounter (Signed)
Requesting: alprazolam 0.25mg  Contract: 08/25/22 UDS: 12/23/22 Last Visit: 12/23/22 Next Visit: 07/07/23 Last Refill: 12/09/22 #30 and 2RF  Please Advise

## 2023-03-29 NOTE — Telephone Encounter (Signed)
 Copied from CRM [id number]. Topic: Clinical - Medication Refill >> Mar 29, 2023 10:43 AM Kara C wrote: Most Recent Primary Care Visit:  Provider: DOMENICA BLACKBIRD A  Department: [facility]  Visit Type: OFFICE VISIT  Date: 12/23/2022  Medication: ALPRAZolam  (XANAX ) 0.25 MG tablet   Has the patient contacted their pharmacy? No, patient contacted providers office to initiate  RX refill. (Agent: If no, request that the patient contact the pharmacy for the refill. If patient does not wish to contact the pharmacy document the reason why and proceed with request.) (Agent: If yes, when and what did the pharmacy advise?)  Is this the correct pharmacy for this prescription? Yes If no, delete pharmacy and type the correct one.  This is the patient's preferred pharmacy:  [facility] [address] [place], [place] - [address] [place] [address] Phone: [phone] Fax: [phone]   Has the prescription been filled recently? No  Is the patient out of the medication? Yes  Has the patient been seen for an appointment in the last year OR does the patient have an upcoming appointment? Yes  Can we respond through MyChart? No  Agent: Please be advised that Rx refills may take up to 3 business days. We ask that you follow-up with your pharmacy.

## 2023-03-29 NOTE — Telephone Encounter (Signed)
Last Fill: 12/09/22  Last OV: 12/23/22 Next OV: 07/07/23  Routing to provider for review/authorization.

## 2023-04-22 ENCOUNTER — Other Ambulatory Visit: Payer: Self-pay | Admitting: Family Medicine

## 2023-04-22 MED ORDER — CITALOPRAM HYDROBROMIDE 20 MG PO TABS: 20.0000 mg | ORAL_TABLET | Freq: Every day | ORAL | 1 refills | Status: DC

## 2023-04-22 NOTE — Telephone Encounter (Signed)
 Last Fill: 10/14/22  Last OV: 12/23/22 Next OV: 07/07/23  Routing to provider for review/authorization.

## 2023-04-22 NOTE — Telephone Encounter (Signed)
 Copied from CRM [id number]. Topic: Clinical - Medication Refill >> Apr 22, 2023 10:50 AM Thomes Dinning wrote: Most Recent Primary Care Visit:  Provider: Danise Edge A  Department: [facility]  Visit Type: OFFICE VISIT  Date: 12/23/2022  Medication: citalopram (CELEXA) 20 MG tablet  Has the patient contacted their pharmacy? No (Agent: If no, request that the patient contact the pharmacy for the refill. If patient does not wish to contact the pharmacy document the reason why and proceed with request.) (Agent: If yes, when and what did the pharmacy advise?)  Is this the correct pharmacy for this prescription? Yes If no, delete pharmacy and type the correct one.  This is the patient's preferred pharmacy:  [facility] [address] [place], [place] - [address] [place] [address] Phone: [phone] Fax: [phone]   Has the prescription been filled recently? No  Is the patient out of the medication? Yes  Has the patient been seen for an appointment in the last year OR does the patient have an upcoming appointment? Yes  Can we respond through MyChart? No  Agent: Please be advised that Rx refills may take up to 3 business days. We ask that you follow-up with your pharmacy.

## 2023-04-27 ENCOUNTER — Other Ambulatory Visit: Payer: Self-pay | Admitting: Family Medicine

## 2023-05-16 ENCOUNTER — Telehealth: Payer: Self-pay | Admitting: Cardiology

## 2023-05-16 DIAGNOSIS — Z79899 Other long term (current) drug therapy: Secondary | ICD-10-CM

## 2023-05-16 NOTE — Progress Notes (Unsigned)
 Cardiology Office Note    Date:  05/18/2023  ID:  Alyssa Williamson, DOB 1931/10/08, MRN [id number] PCP:  Bradd Canary, MD  Cardiologist:  [name], MD  Electrophysiologist:  None   Chief Complaint: Lower extremity edema   History of Present Illness: Marland Kitchen    Alyssa Williamson is a 89 y.o. female with visit-pertinent history of atrial fibrillation on chronic anticoagulation, chronic diastolic CHF, hypertension and hyperlipidemia.  Echocardiogram on 07/11/2020 indicated LVEF of 55 to 60%, no RWMA, grade 2 diastolic dysfunction, RV function was normal, size was mildly enlarged.  There was mildly elevated pulmonary artery systolic pressures.  Right atria was mildly dilated.  The aortic valve was normal in structure, there is evidence of aortic valve sclerosis with no evidence of stenosis.  Patient was seen by Dr. Jens Som on 07/07/2022 by Dr. Jens Som and was found to be in atrial fibrillation, at that time rate control was pursued.  Patient was seen by Robin Searing, NP on 02/21/2023 for evaluation of shortness of breath and lower extremity edema.  Patient's Lasix was increased to 40 mg twice daily for 3 days.  On 05/16/2022 patient notified the office that she had weight gain of 8 pounds in the prior 2 weeks and was having problems with her lower extremity swelling.  She reported wheezing and shortness of breath with walking too far.  Dr. Jens Som increased her Lasix to 40 mg twice daily for 3 days with recommendation for be met in 1 week.  Today patient presents regarding increased lower extremity edema and shortness of breath.  Patient reports that in recent weeks she has slowly noticed a gradual increase in swelling in her lower extremities and progressive shortness of breath.  She denies any chest pain, orthopnea or PND.  She notes that she has had weight gain in recent weeks.  She started Lasix 40 mg twice daily yesterday, notes that she did have significant improvement in urine output yesterday,  has not yet noticed an improvement in lower extremity edema or breathing.  She denies any change in her dietary habits.  She reports that she lost 2 pounds in the last 24 hours.  ROS: .   Today she denies chest pain, fatigue, palpitations, melena, hematuria, hemoptysis, diaphoresis, weakness, presyncope, syncope, orthopnea, and PND.  All other systems are reviewed and otherwise negative. Studies Reviewed: Marland Kitchen   EKG:  EKG is ordered today, personally reviewed, demonstrating  EKG Interpretation Date/Time:  Wednesday May 18 2023 14:56:22 EDT Ventricular Rate:  77 PR Interval:    QRS Duration:  114 QT Interval:  406 QTC Calculation: 459 R Axis:   35  Text Interpretation: Atrial fibrillation Low voltage QRS Right bundle branch block T wave abnormality, consider inferolateral ischemia When compared with ECG of 21-Feb-2023 12:01, No significant change was found Confirmed by Reather Littler [phone]) on 05/18/2023 4:40:08 PM   CV Studies: Cardiac studies reviewed are outlined and summarized above. Otherwise please see EMR for full report. Cardiac Studies & Procedures   ______________________________________________________________________________________________     ECHOCARDIOGRAM  ECHOCARDIOGRAM COMPLETE 07/15/2020  Narrative ECHOCARDIOGRAM REPORT    Patient Name:   Alyssa Williamson Date of Exam: 07/15/2020 Medical Rec #:  [id number]     Height:       62.0 in Accession #:    [id number]    Weight:       193.6 lb Date of Birth:  [birth year]     BSA:  1.885 m Patient Age:    73 years      BP:           128/66 mmHg Patient Gender: F             HR:           69 bpm. Exam Location:  Inpatient  Procedure: 2D Echo, Cardiac Doppler and Color Doppler  Indications:    CHF  History:        Patient has prior history of Echocardiogram examinations, most recent 06/26/2019. Arrythmias:Atrial Fibrillation, Signs/Symptoms:Murmur; Risk Factors:Hypertension and Dyslipidemia.  Sonographer:    Neomia Dear RDCS Referring Phys: [id number] Jae Dire   Sonographer Comments: Patient is morbidly obese. Image acquisition challenging due to patient body habitus. IMPRESSIONS   1. Left ventricular ejection fraction, by estimation, is 55 to 60%. The left ventricle has normal function. The left ventricle has no regional wall motion abnormalities. Left ventricular diastolic parameters are consistent with Grade II diastolic dysfunction (pseudonormalization). 2. Right ventricular systolic function is normal. The right ventricular size is mildly enlarged. There is mildly elevated pulmonary artery systolic pressure. The estimated right ventricular systolic pressure is 37.1 mmHg. 3. Right atrial size was mildly dilated. 4. The mitral valve is normal in structure. Mild mitral valve regurgitation. No evidence of mitral stenosis. 5. The aortic valve is normal in structure. There is mild calcification of the aortic valve. There is mild thickening of the aortic valve. Aortic valve regurgitation is not visualized. Mild aortic valve sclerosis is present, with no evidence of aortic valve stenosis. 6. The inferior vena cava is normal in size with greater than 50% respiratory variability, suggesting right atrial pressure of 3 mmHg.  FINDINGS Left Ventricle: Left ventricular ejection fraction, by estimation, is 55 to 60%. The left ventricle has normal function. The left ventricle has no regional wall motion abnormalities. The left ventricular internal cavity size was normal in size. There is no left ventricular hypertrophy. Left ventricular diastolic parameters are consistent with Grade II diastolic dysfunction (pseudonormalization).  Right Ventricle: The right ventricular size is mildly enlarged. No increase in right ventricular wall thickness. Right ventricular systolic function is normal. There is mildly elevated pulmonary artery systolic pressure. The tricuspid regurgitant velocity is 2.92 m/s, and with an  assumed right atrial pressure of 3 mmHg, the estimated right ventricular systolic pressure is 37.1 mmHg.  Left Atrium: Left atrial size was normal in size.  Right Atrium: Right atrial size was mildly dilated.  Pericardium: There is no evidence of pericardial effusion.  Mitral Valve: The mitral valve is normal in structure. Mild mitral annular calcification. Mild mitral valve regurgitation. No evidence of mitral valve stenosis. MV peak gradient, 8.0 mmHg. The mean mitral valve gradient is 3.0 mmHg.  Tricuspid Valve: The tricuspid valve is normal in structure. Tricuspid valve regurgitation is trivial. No evidence of tricuspid stenosis.  Aortic Valve: The aortic valve is normal in structure. There is mild calcification of the aortic valve. There is mild thickening of the aortic valve. Aortic valve regurgitation is not visualized. Mild aortic valve sclerosis is present, with no evidence of aortic valve stenosis. Aortic valve mean gradient measures 6.0 mmHg. Aortic valve peak gradient measures 10.2 mmHg. Aortic valve area, by VTI measures 1.93 cm.  Pulmonic Valve: The pulmonic valve was normal in structure. Pulmonic valve regurgitation is not visualized. No evidence of pulmonic stenosis.  Aorta: The aortic root is normal in size and structure.  Venous: The inferior vena cava is normal in  size with greater than 50% respiratory variability, suggesting right atrial pressure of 3 mmHg.  IAS/Shunts: No atrial level shunt detected by color flow Doppler.   LEFT VENTRICLE PLAX 2D LVIDd:         5.20 cm     Diastology LVIDs:         2.60 cm     LV e' medial:    5.18 cm/s LV PW:         1.00 cm     LV E/e' medial:  22.4 LV IVS:        1.00 cm     LV e' lateral:   5.70 cm/s LVOT diam:     1.80 cm     LV E/e' lateral: 20.4 LV SV:         67 LV SV Index:   36 LVOT Area:     2.54 cm  LV Volumes (MOD) LV vol d, MOD A2C: 51.1 ml LV vol d, MOD A4C: 41.6 ml LV vol s, MOD A2C: 23.2 ml LV vol s, MOD  A4C: 24.3 ml LV SV MOD A2C:     27.9 ml LV SV MOD A4C:     41.6 ml LV SV MOD BP:      21.0 ml  RIGHT VENTRICLE RV Basal diam:  4.20 cm RV Mid diam:    2.50 cm RV S prime:     13.10 cm/s TAPSE (M-mode): 2.6 cm  LEFT ATRIUM             Index       RIGHT ATRIUM           Index LA diam:        4.10 cm 2.17 cm/m  RA Area:     15.10 cm LA Vol (A2C):   54.3 ml 28.80 ml/m RA Volume:   37.40 ml  19.84 ml/m LA Vol (A4C):   61.4 ml 32.57 ml/m LA Biplane Vol: 61.9 ml 32.83 ml/m AORTIC VALVE                    PULMONIC VALVE AV Area (Vmax):    1.65 cm     PV Vmax:       0.81 m/s AV Area (Vmean):   1.80 cm     PV Vmean:      65.200 cm/s AV Area (VTI):     1.93 cm     PV VTI:        0.234 m AV Vmax:           160.00 cm/s  PV Peak grad:  2.6 mmHg AV Vmean:          109.000 cm/s PV Mean grad:  2.0 mmHg AV VTI:            0.350 m AV Peak Grad:      10.2 mmHg AV Mean Grad:      6.0 mmHg LVOT Vmax:         104.00 cm/s LVOT Vmean:        77.100 cm/s LVOT VTI:          0.265 m LVOT/AV VTI ratio: 0.76  AORTA Ao Root diam: 2.70 cm Ao Asc diam:  2.60 cm  MITRAL VALVE                TRICUSPID VALVE MV Area (PHT): 5.62 cm     TR Peak grad:   34.1 mmHg MV Area VTI:   1.80 cm  TR Vmax:        292.00 cm/s MV Peak grad:  8.0 mmHg MV Mean grad:  3.0 mmHg     SHUNTS MV Vmax:       1.41 m/s     Systemic VTI:  0.26 m MV Vmean:      70.1 cm/s    Systemic Diam: 1.80 cm MV Decel Time: 135 msec MR Peak grad: 72.9 mmHg MR Mean grad: 53.0 mmHg MR Vmax:      427.00 cm/s MR Vmean:     352.0 cm/s MV E velocity: 116.00 cm/s MV A velocity: 45.90 cm/s MV E/A ratio:  2.53  Donato Schultz MD Electronically signed by Donato Schultz MD Signature Date/Time: 07/15/2020/2:21:55 PM    Final          ______________________________________________________________________________________________       Current Reported Medications:.    Current Meds  Medication Sig   acetaminophen (TYLENOL) 325 MG  tablet Take 2 tablets (650 mg total) by mouth every 6 (six) hours as needed for mild pain (or Fever >/= 101).   albuterol (VENTOLIN HFA) 108 (90 Base) MCG/ACT inhaler Inhale 1 puff into the lungs every 6 (six) hours as needed for wheezing or shortness of breath.   ALPRAZolam (XANAX) 0.25 MG tablet Take 1 tablet (0.25 mg total) by mouth daily as needed for anxiety.   apixaban (ELIQUIS) 5 MG TABS tablet Take 1 tablet (5 mg total) by mouth 2 (two) times daily.   atorvastatin (LIPITOR) 40 MG tablet TAKE 1 TABLET BY MOUTH ONCE DAILY APPT  NEEDED  FOR  FUTURE  REFILLS   citalopram (CELEXA) 20 MG tablet Take 1 tablet (20 mg total) by mouth daily.   furosemide (LASIX) 20 MG tablet Take 2 tablets (40 mg total) by mouth daily.   levothyroxine (SYNTHROID) 75 MCG tablet Take 1 tablet (75 mcg total) by mouth daily before breakfast.   loratadine (CLARITIN) 10 MG tablet Take 10 mg by mouth daily.   Multiple Vitamins-Minerals (CENTRUM SILVER PO) Take 1 tablet by mouth daily.   potassium chloride SA (KLOR-CON M) 20 MEQ tablet Take 1 tablet (20 mEq total) by mouth daily.   verapamil (CALAN-SR) 240 MG CR tablet Take 1 tablet (240 mg total) by mouth daily.   Physical Exam:    VS:  BP 118/62   Pulse 77   Ht 5\' 2"  (1.575 m)   Wt 187 lb 6.4 oz (85 kg)   SpO2 95%   BMI 34.28 kg/m    Wt Readings from Last 3 Encounters:  05/18/23 187 lb 6.4 oz (85 kg)  02/21/23 192 lb (87.1 kg)  12/23/22 179 lb 12.8 oz (81.6 kg)    GEN: Well nourished, well developed in no acute distress NECK: No JVD; No carotid bruits CARDIAC: IRRR, no murmurs, rubs, gallops RESPIRATORY:  Clear to auscultation without rales, wheezing or rhonchi  ABDOMEN: Soft, non-tender, non-distended EXTREMITIES:  No edema; No acute deformity     Asessement and Plan:.    Chronic diastolic HF: Echocardiogram on 07/11/2020 indicated LVEF of 55 to 60%, no RWMA, grade 2 diastolic dysfunction, RV function was normal, size was mildly enlarged.  There was  mildly elevated pulmonary artery systolic pressures.  Right atrium was mildly dilated.  Patient presents today with increased lower extremity edema and shortness of breath, reports this has been progressive over recent weeks.  She denies any orthopnea or PND.  She does endorse a slow weight gain in recent weeks.  She started on Lasix  40 mg twice daily yesterday, reports she has lost 2 pounds in the last 24 hours.  Patient's lower extremity edema with +2 pitting edema to the knees.  Encourage patient to monitor fluid and sodium intake.  Continue to monitor daily weights and notify the office of weight gain of 2 to 3 pounds overnight or 5 pounds in a week.  Patient will continue Lasix 40 mg twice daily until Saturday, will check be met on Tuesday with close follow-up on Wednesday.  Check echocardiogram.  Reviewed ED precautions.  Persistent atrial fibrillation: EKG today indicates atrial fibrillation at 77 bpm. Continue verapamil and Eliquis.  Patient denies any bleeding problems on Eliquis.  Hypertension: Blood pressure today 118/62. Continue current antihypertensive regimen.  Hyperlipidemia: On Lipitor 40 mg daily.   Disposition: F/u with Reather Littler, NP in one week.   Signed, Rip Harbour, NP

## 2023-05-16 NOTE — Telephone Encounter (Signed)
 Patient identification verified by 2 forms. Marilynn Rail, RN   Called and spoke to patient  Patient states:   -she has gained 8lbs in the past 2 weeks   -she is having issues with leg swelling   -has wheezing and SOB if she walks too far   -Takes lasix 40mg  daily  Patient scheduled for OV 3/26 with NP Chad  Informed patient RN will speak to Dr. Jens Som and follow up with recommendations  Patient agrees, no questions at this time

## 2023-05-16 NOTE — Telephone Encounter (Signed)
 Pt c/o swelling: STAT is pt has developed SOB within 24 hours  How much weight have you gained and in what time span? Yes, 8lbs in about 2 weeks   If swelling, where is the swelling located? Swelling   Are you currently taking a fluid pill? yes  Are you currently SOB? yes  Do you have a log of your daily weights (if so, list)?    Have you gained 3 pounds in a day or 5 pounds in a week?    Have you traveled recently? yes

## 2023-05-16 NOTE — Telephone Encounter (Signed)
 Per Dr. Jens Som:   -increase lasix to 40mg  BID x3 days   -Follow up at 3/26 OV   -BMET in 1 week   Patient identification verified by 2 forms. Marilynn Rail, RN    Called and spoke to patient  Relayed provider message  Advised patient to keep 3/26 OV for follow up  Patient verbalized understanding, no questions at this time

## 2023-05-18 ENCOUNTER — Encounter: Payer: Self-pay | Admitting: Cardiology

## 2023-05-18 ENCOUNTER — Ambulatory Visit: Attending: Cardiology | Admitting: Cardiology

## 2023-05-18 VITALS — BP 118/62 | HR 77 | Ht 62.0 in | Wt 187.4 lb

## 2023-05-18 DIAGNOSIS — R600 Localized edema: Secondary | ICD-10-CM | POA: Diagnosis not present

## 2023-05-18 DIAGNOSIS — R0602 Shortness of breath: Secondary | ICD-10-CM

## 2023-05-18 DIAGNOSIS — I5032 Chronic diastolic (congestive) heart failure: Secondary | ICD-10-CM | POA: Diagnosis not present

## 2023-05-18 DIAGNOSIS — I4819 Other persistent atrial fibrillation: Secondary | ICD-10-CM

## 2023-05-18 DIAGNOSIS — I10 Essential (primary) hypertension: Secondary | ICD-10-CM | POA: Diagnosis not present

## 2023-05-18 DIAGNOSIS — E7800 Pure hypercholesterolemia, unspecified: Secondary | ICD-10-CM

## 2023-05-18 NOTE — Patient Instructions (Signed)
 Medication Instructions:  No changes *If you need a refill on your cardiac medications before your next appointment, please call your pharmacy*   Lab Work: Bmet on Tuesday @ [place] If you have labs (blood work) drawn today and your tests are completely normal, you will receive your results only by: MyChart Message (if you have MyChart) OR A paper copy in the mail If you have any lab test that is abnormal or we need to change your treatment, we will call you to review the results.  Testing/Procedures: Schedule for [facility] Your physician has requested that you have an echocardiogram. Echocardiography is a painless test that uses sound waves to create images of your heart. It provides your doctor with information about the size and shape of your heart and how well your heart's chambers and valves are working. This procedure takes approximately one hour. There are no restrictions for this procedure. Please do NOT wear cologne, perfume, aftershave, or lotions (deodorant is allowed). Please arrive 15 minutes prior to your appointment time.  Please note: We ask at that you not bring children with you during ultrasound (echo/ vascular) testing. Due to room size and safety concerns, children are not allowed in the ultrasound rooms during exams. Our front office staff cannot provide observation of children in our lobby area while testing is being conducted. An adult accompanying a patient to their appointment will only be allowed in the ultrasound room at the discretion of the ultrasound technician under special circumstances. We apologize for any inconvenience.   Follow-Up: At [facility], you and your health needs are our priority.  As part of our continuing mission to provide you with exceptional heart care, we have created designated Provider Care Teams.  These Care Teams include your primary Cardiologist (physician) and Advanced Practice Providers (APPs -  Physician Assistants and  Nurse Practitioners) who all work together to provide you with the care you need, when you need it.  We recommend signing up for the patient portal called "MyChart".  Sign up information is provided on this After Visit Summary.  MyChart is used to connect with patients for Virtual Visits (Telemedicine).  Patients are able to view lab/test results, encounter notes, upcoming appointments, etc.  Non-urgent messages can be sent to your provider as well.   To learn more about what you can do with MyChart, go to [email or web address].    Your next appointment:   Check next page

## 2023-05-23 NOTE — Progress Notes (Unsigned)
 Cardiology Office Note    Date:  05/25/2023  ID:  Alyssa Williamson, DOB 09/03/1931, MRN [id number] PCP:  Alyssa Canary, MD  Cardiologist:  Alyssa Millers, MD  Electrophysiologist:  None   Chief Complaint: Lower extremity edema   History of Present Illness: Alyssa Williamson Kitchen    Alyssa Williamson is a 89 y.o. female with visit-pertinent history of atrial fibrillation on chronic anticoagulation, chronic diastolic CHF, hypertension and hyperlipidemia.  Echocardiogram on 07/11/2020 indicated LVEF of 55 to 60%, no RWMA, grade 2 diastolic dysfunction, RV function was normal, size was mildly enlarged.  There was mildly elevated pulmonary artery systolic pressures.  Right atria was mildly dilated.  The aortic valve was normal in structure, there is evidence of aortic valve sclerosis with no evidence of stenosis.  Patient was seen by Dr. Jens Williamson on 07/07/2022 by Dr. Jens Williamson and was found to be in atrial fibrillation, at that time rate control was pursued.  Patient was seen by Alyssa Searing, NP on 02/21/2023 for evaluation of shortness of breath and lower extremity edema.  Patient's Lasix was increased to 40 mg twice daily for 3 days.  On 05/16/2022 patient notified the office that she had weight gain of 8 pounds in the prior 2 weeks and was having problems with her lower extremity swelling.  She reported wheezing and shortness of breath with walking too far.  Dr. Jens Williamson increased her Lasix to 40 mg twice daily for 3 days with recommendation for BMET in 1 week.  Today she presents for follow-up.  She reports that she is feeling significantly better.  She reports that her lower extremity edema is back to her baseline and her breathing has significantly improved.  She is regularly wearing compression stockings with improvement in her lower extremity edema.  She denies any chest pain, orthopnea, PND, presyncope, syncope or palpitations.  She notes that on occasion she has taken an extra dose of Lasix in the afternoon which she  feels has also improved her lower extremity edema.  Labwork independently reviewed: 05/24/23: Sodium 141, potassium 4.1, creatinine 1.21 ROS: .   Today she denies chest pain, shortness of breath, lower extremity edema, fatigue, palpitations, melena, hematuria, hemoptysis, diaphoresis, weakness, presyncope, syncope, orthopnea, and PND.  All other systems are reviewed and otherwise negative. Studies Reviewed: Alyssa Williamson Kitchen   EKG:  EKG is not ordered today.  CV Studies: Cardiac studies reviewed are outlined and summarized above. Otherwise please see EMR for full report. Cardiac Studies & Procedures   ______________________________________________________________________________________________     ECHOCARDIOGRAM  ECHOCARDIOGRAM COMPLETE 07/15/2020  Narrative ECHOCARDIOGRAM REPORT    Patient Name:   Alyssa Williamson Date of Exam: 07/15/2020 Medical Rec #:  [id number]     Height:       62.0 in Accession #:    [id number]    Weight:       193.6 lb Date of Birth:  Jun 22, 1931     BSA:          1.885 m Patient Age:    89 years      BP:           128/66 mmHg Patient Gender: F             HR:           69 bpm. Exam Location:  Inpatient  Procedure: 2D Echo, Cardiac Doppler and Color Doppler  Indications:    CHF  History:        Patient has prior history of Echocardiogram  examinations, most recent 06/26/2019. Arrythmias:Atrial Fibrillation, Signs/Symptoms:Murmur; Risk Factors:Hypertension and Dyslipidemia.  Sonographer:    Neomia Dear RDCS Referring Phys: [id number] Jae Dire   Sonographer Comments: Patient is morbidly obese. Image acquisition challenging due to patient body habitus. IMPRESSIONS   1. Left ventricular ejection fraction, by estimation, is 55 to 60%. The left ventricle has normal function. The left ventricle has no regional wall motion abnormalities. Left ventricular diastolic parameters are consistent with Grade II diastolic dysfunction (pseudonormalization). 2. Right ventricular  systolic function is normal. The right ventricular size is mildly enlarged. There is mildly elevated pulmonary artery systolic pressure. The estimated right ventricular systolic pressure is 37.1 mmHg. 3. Right atrial size was mildly dilated. 4. The mitral valve is normal in structure. Mild mitral valve regurgitation. No evidence of mitral stenosis. 5. The aortic valve is normal in structure. There is mild calcification of the aortic valve. There is mild thickening of the aortic valve. Aortic valve regurgitation is not visualized. Mild aortic valve sclerosis is present, with no evidence of aortic valve stenosis. 6. The inferior vena cava is normal in size with greater than 50% respiratory variability, suggesting right atrial pressure of 3 mmHg.  FINDINGS Left Ventricle: Left ventricular ejection fraction, by estimation, is 55 to 60%. The left ventricle has normal function. The left ventricle has no regional wall motion abnormalities. The left ventricular internal cavity size was normal in size. There is no left ventricular hypertrophy. Left ventricular diastolic parameters are consistent with Grade II diastolic dysfunction (pseudonormalization).  Right Ventricle: The right ventricular size is mildly enlarged. No increase in right ventricular wall thickness. Right ventricular systolic function is normal. There is mildly elevated pulmonary artery systolic pressure. The tricuspid regurgitant velocity is 2.92 m/s, and with an assumed right atrial pressure of 3 mmHg, the estimated right ventricular systolic pressure is 37.1 mmHg.  Left Atrium: Left atrial size was normal in size.  Right Atrium: Right atrial size was mildly dilated.  Pericardium: There is no evidence of pericardial effusion.  Mitral Valve: The mitral valve is normal in structure. Mild mitral annular calcification. Mild mitral valve regurgitation. No evidence of mitral valve stenosis. MV peak gradient, 8.0 mmHg. The mean mitral valve  gradient is 3.0 mmHg.  Tricuspid Valve: The tricuspid valve is normal in structure. Tricuspid valve regurgitation is trivial. No evidence of tricuspid stenosis.  Aortic Valve: The aortic valve is normal in structure. There is mild calcification of the aortic valve. There is mild thickening of the aortic valve. Aortic valve regurgitation is not visualized. Mild aortic valve sclerosis is present, with no evidence of aortic valve stenosis. Aortic valve mean gradient measures 6.0 mmHg. Aortic valve peak gradient measures 10.2 mmHg. Aortic valve area, by VTI measures 1.93 cm.  Pulmonic Valve: The pulmonic valve was normal in structure. Pulmonic valve regurgitation is not visualized. No evidence of pulmonic stenosis.  Aorta: The aortic root is normal in size and structure.  Venous: The inferior vena cava is normal in size with greater than 50% respiratory variability, suggesting right atrial pressure of 3 mmHg.  IAS/Shunts: No atrial level shunt detected by color flow Doppler.   LEFT VENTRICLE PLAX 2D LVIDd:         5.20 cm     Diastology LVIDs:         2.60 cm     LV e' medial:    5.18 cm/s LV PW:         1.00 cm     LV E/e' medial:  22.4 LV IVS:        1.00 cm     LV e' lateral:   5.70 cm/s LVOT diam:     1.80 cm     LV E/e' lateral: 20.4 LV SV:         67 LV SV Index:   36 LVOT Area:     2.54 cm  LV Volumes (MOD) LV vol d, MOD A2C: 51.1 ml LV vol d, MOD A4C: 41.6 ml LV vol s, MOD A2C: 23.2 ml LV vol s, MOD A4C: 24.3 ml LV SV MOD A2C:     27.9 ml LV SV MOD A4C:     41.6 ml LV SV MOD BP:      21.0 ml  RIGHT VENTRICLE RV Basal diam:  4.20 cm RV Mid diam:    2.50 cm RV S prime:     13.10 cm/s TAPSE (M-mode): 2.6 cm  LEFT ATRIUM             Index       RIGHT ATRIUM           Index LA diam:        4.10 cm 2.17 cm/m  RA Area:     15.10 cm LA Vol (A2C):   54.3 ml 28.80 ml/m RA Volume:   37.40 ml  19.84 ml/m LA Vol (A4C):   61.4 ml 32.57 ml/m LA Biplane Vol: 61.9 ml 32.83  ml/m AORTIC VALVE                    PULMONIC VALVE AV Area (Vmax):    1.65 cm     PV Vmax:       0.81 m/s AV Area (Vmean):   1.80 cm     PV Vmean:      65.200 cm/s AV Area (VTI):     1.93 cm     PV VTI:        0.234 m AV Vmax:           160.00 cm/s  PV Peak grad:  2.6 mmHg AV Vmean:          109.000 cm/s PV Mean grad:  2.0 mmHg AV VTI:            0.350 m AV Peak Grad:      10.2 mmHg AV Mean Grad:      6.0 mmHg LVOT Vmax:         104.00 cm/s LVOT Vmean:        77.100 cm/s LVOT VTI:          0.265 m LVOT/AV VTI ratio: 0.76  AORTA Ao Root diam: 2.70 cm Ao Asc diam:  2.60 cm  MITRAL VALVE                TRICUSPID VALVE MV Area (PHT): 5.62 cm     TR Peak grad:   34.1 mmHg MV Area VTI:   1.80 cm     TR Vmax:        292.00 cm/s MV Peak grad:  8.0 mmHg MV Mean grad:  3.0 mmHg     SHUNTS MV Vmax:       1.41 m/s     Systemic VTI:  0.26 m MV Vmean:      70.1 cm/s    Systemic Diam: 1.80 cm MV Decel Time: 135 msec MR Peak grad: 72.9 mmHg MR Mean grad: 53.0 mmHg MR Vmax:      427.00 cm/s  MR Vmean:     352.0 cm/s MV E velocity: 116.00 cm/s MV A velocity: 45.90 cm/s MV E/A ratio:  2.53  Donato Schultz MD Electronically signed by Donato Schultz MD Signature Date/Time: 07/15/2020/2:21:55 PM    Final          ______________________________________________________________________________________________      Current Reported Medications:.    Current Meds  Medication Sig   acetaminophen (TYLENOL) 325 MG tablet Take 2 tablets (650 mg total) by mouth every 6 (six) hours as needed for mild pain (or Fever >/= 101).   albuterol (VENTOLIN HFA) 108 (90 Base) MCG/ACT inhaler Inhale 1 puff into the lungs every 6 (six) hours as needed for wheezing or shortness of breath.   ALPRAZolam (XANAX) 0.25 MG tablet Take 1 tablet (0.25 mg total) by mouth daily as needed for anxiety.   apixaban (ELIQUIS) 5 MG TABS tablet Take 1 tablet (5 mg total) by mouth 2 (two) times daily.   atorvastatin  (LIPITOR) 40 MG tablet TAKE 1 TABLET BY MOUTH ONCE DAILY APPT  NEEDED  FOR  FUTURE  REFILLS   citalopram (CELEXA) 20 MG tablet Take 1 tablet (20 mg total) by mouth daily.   furosemide (LASIX) 20 MG tablet Take 2 tablets (40 mg total) by mouth daily.   levothyroxine (SYNTHROID) 75 MCG tablet Take 1 tablet (75 mcg total) by mouth daily before breakfast.   loratadine (CLARITIN) 10 MG tablet Take 10 mg by mouth daily.   Multiple Vitamins-Minerals (CENTRUM SILVER PO) Take 1 tablet by mouth daily.   potassium chloride SA (KLOR-CON M) 20 MEQ tablet Take 1 tablet (20 mEq total) by mouth daily.   verapamil (CALAN-SR) 240 MG CR tablet Take 1 tablet (240 mg total) by mouth daily.   Physical Exam:    VS:  BP (!) 120/58 (BP Location: Left Arm)   Pulse 79   Ht 5\' 2"  (1.575 m)   Wt 183 lb (83 kg)   SpO2 94%   BMI 33.47 kg/m    Wt Readings from Last 3 Encounters:  05/25/23 183 lb (83 kg)  05/18/23 187 lb 6.4 oz (85 kg)  02/21/23 192 lb (87.1 kg)    GEN: Well nourished, well developed in no acute distress NECK: No JVD; No carotid bruits CARDIAC: IRR, no murmurs, rubs, gallops RESPIRATORY:  Clear to auscultation without rales, wheezing or rhonchi  ABDOMEN: Soft, non-tender, non-distended EXTREMITIES:  Mild bilateral pedal edema; No acute deformity     Asessement and Plan:.    Chronic diastolic HF: Echocardiogram on 07/11/2020 indicated LVEF of 55 to 60%, no RWMA, grade 2 diastolic dysfunction, RV function was normal, size was mildly enlarged.  There was mildly elevated pulmonary artery systolic pressures.  Right atrium was mildly dilated.  Patient presented last week with increased lower extremity edema and shortness of breath, reports this has been progressive over weeks.  Patient was started on Lasix 40 mg twice daily.  Today she reports that she is feeling significantly better, she notes that her breathing has significantly improved and her lower extremity edema is back to her baseline.  Patient  notes since returning back to 40 mg once daily she has on occasion taken 20 mg in the afternoon with improvement as well.  Patient has mild bilateral lower extremity edema, lung sounds are clear. Encouraged patient to monitor fluid and sodium intake.  Continue to monitor daily weights and notify the office of weight gain of 2 to 3 pounds overnight or 5 pounds in a week.  Echocardiogram is currently pending.  Will have patient continue Lasix 40 mg in the morning and 20 mg in the afternoon.  Will have patient follow-up after echocardiogram, plan to recheck BMET at that time.  Reviewed ED precautions.  Persistent atrial fibrillation: Heart rate well-controlled at 79 bpm.  Continue verapamil and Eliquis.  Patient denies any bleeding problems on Eliquis.  Hypertension: Blood pressure today 120/58.  Continue current antihypertensive regimen.  Hyperlipidemia: On lipitor 40 mg daily.   Disposition: F/u with Reather Littler, NP in three weeks.   Signed, Rip Harbour, NP

## 2023-05-25 ENCOUNTER — Ambulatory Visit: Attending: Cardiology | Admitting: Cardiology

## 2023-05-25 VITALS — BP 120/58 | HR 79 | Ht 62.0 in | Wt 183.0 lb

## 2023-05-25 DIAGNOSIS — R600 Localized edema: Secondary | ICD-10-CM | POA: Diagnosis not present

## 2023-05-25 DIAGNOSIS — I5032 Chronic diastolic (congestive) heart failure: Secondary | ICD-10-CM | POA: Diagnosis not present

## 2023-05-25 DIAGNOSIS — E782 Mixed hyperlipidemia: Secondary | ICD-10-CM

## 2023-05-25 DIAGNOSIS — I4819 Other persistent atrial fibrillation: Secondary | ICD-10-CM

## 2023-05-25 DIAGNOSIS — I10 Essential (primary) hypertension: Secondary | ICD-10-CM

## 2023-05-25 LAB — BASIC METABOLIC PANEL WITH GFR
BUN/Creatinine Ratio: 16 (ref 12–28)
BUN: 19 mg/dL (ref 10–36)
CO2: 24 mmol/L (ref 20–29)
Calcium: 9.1 mg/dL (ref 8.7–10.3)
Chloride: 100 mmol/L (ref 96–106)
Creatinine, Ser: 1.21 mg/dL — ABNORMAL HIGH (ref 0.57–1.00)
Glucose: 96 mg/dL (ref 70–99)
Potassium: 4.1 mmol/L (ref 3.5–5.2)
Sodium: 141 mmol/L (ref 134–144)
eGFR: 43 mL/min/{1.73_m2} — ABNORMAL LOW (ref >59)

## 2023-05-25 MED ORDER — FUROSEMIDE 20 MG PO TABS: 20.0000 mg | ORAL_TABLET | Freq: Two times a day (BID) | ORAL | 3 refills | Status: DC

## 2023-05-25 MED ORDER — FUROSEMIDE 20 MG PO TABS: ORAL_TABLET | ORAL | 3 refills | Status: AC

## 2023-05-25 NOTE — Patient Instructions (Addendum)
 Medication Instructions:  Your physician has recommended you make the following change in your medication:  CHANGE: Lasix 40 mg in the morning and 20 mg in the afternoon *If you need a refill on your cardiac medications before your next appointment, please call your pharmacy*  Follow-Up: At [facility], you and your health needs are our priority.  As part of our continuing mission to provide you with exceptional heart care, our providers are all part of one team.  This team includes your primary Cardiologist (physician) and Advanced Practice Providers or APPs (Physician Assistants and Nurse Practitioners) who all work together to provide you with the care you need, when you need it.  Your next appointment:   April 28th afternoon  Provider:   Reather Littler, NP   Other Instructions:      1st Floor: - Lobby - Registration  - Pharmacy  - Lab - Cafe  2nd Floor: - PV Lab - Diagnostic Testing (echo, CT, nuclear med)  3rd Floor: - Vacant  4th Floor: - TCTS (cardiothoracic surgery) - AFib Clinic - Structural Heart Clinic - Vascular Surgery  - Vascular Ultrasound  5th Floor: - [facility] (general and EP) - Clinical Pharmacy for coumadin, hypertension, lipid, weight-loss medications, and med management appointments    Valet parking services will be available as well.

## 2023-05-26 ENCOUNTER — Encounter: Payer: Self-pay | Admitting: Cardiology

## 2023-06-09 ENCOUNTER — Ambulatory Visit (HOSPITAL_BASED_OUTPATIENT_CLINIC_OR_DEPARTMENT_OTHER)
Admission: RE | Admit: 2023-06-09 | Discharge: 2023-06-09 | Disposition: A | Discharge status: Home / Self Care | Source: Ambulatory Visit | Attending: Cardiology | Admitting: Cardiology

## 2023-06-09 DIAGNOSIS — R0602 Shortness of breath: Secondary | ICD-10-CM | POA: Diagnosis present

## 2023-06-09 DIAGNOSIS — I4819 Other persistent atrial fibrillation: Secondary | ICD-10-CM | POA: Diagnosis present

## 2023-06-09 DIAGNOSIS — R600 Localized edema: Secondary | ICD-10-CM | POA: Diagnosis present

## 2023-06-10 LAB — ECHOCARDIOGRAM COMPLETE
AR max vel: 1.73 cm2
AV Area VTI: 1.61 cm2
AV Area mean vel: 1.62 cm2
AV Mean grad: 3.4 mmHg
AV Peak grad: 5.2 mmHg
Ao pk vel: 1.14 m/s
Area-P 1/2: 4.81 cm2
Calc EF: 54 %
MV M vel: 1.98 m/s
MV Peak grad: 15.7 mmHg
S' Lateral: 2.6 cm
Single Plane A2C EF: 53.4 %
Single Plane A4C EF: 54.9 %

## 2023-06-18 NOTE — Progress Notes (Deleted)
 Cardiology Office Note    Date:  06/18/2023  ID:  Alyssa Williamson, DOB Jun 07, 1931, MRN [id number] PCP:  Neda Balk, MD  Cardiologist:  Alexandria Angel, MD  Electrophysiologist:  None   Chief Complaint: ***  History of Present Illness: Alyssa Williamson    Alyssa Williamson is a 89 y.o. female with visit-pertinent history of atrial fibrillation on chronic anticoagulation, chronic diastolic CHF, hypertension and hyperlipidemia.  Echocardiogram on 07/11/2020 indicated LVEF of 55 to 60%, no RWMA, grade 2 diastolic dysfunction, RV function was normal, size was mildly enlarged.  There was mildly elevated pulmonary artery systolic pressures.  Right atria was mildly dilated.  The aortic valve was normal in structure, there is evidence of aortic valve sclerosis with no evidence of stenosis.  Patient was seen by Dr. Audery Blazing on 07/07/2022 by Dr. Audery Blazing and was found to be in atrial fibrillation, at that time rate control was pursued.  Patient was seen by Charles Connor, NP on 02/21/2023 for evaluation of shortness of breath and lower extremity edema.  Patient's Lasix  was increased to 40 mg twice daily for 3 days.  On 05/16/2022 patient notified the office that she had weight gain of 8 pounds in the prior 2 weeks and was having problems with her lower extremity swelling.  She reported wheezing and shortness of breath with walking too far.  Dr. Audery Blazing increased her Lasix  to 40 mg twice daily for 3 days with recommendation for BMET in 1 week.  Patient was last in clinic on 05/25/2023, she reported that she is feeling significantly better.  She reported lower extremity edema was back to her baseline and her breathing had significantly improved.  She did note that on occasion she was taking extra doses of Lasix  in the afternoon which she felt had improved her lower extremity edema.  Echocardiogram 03/11/2023 indicated LVEF of 50 to 55%, no RWMA, diastolic parameters were indeterminate, RV function and size was normal, LA was mildly  dilated, there was trivial mitral regurgitation, no evidence of stenosis, tricuspid valve regurgitation was moderate, aortic valve was normal in structure with no evidence of stenosis.  Today she presents for follow-up.  She reports that she  Chronic diastolic HF: chocardiogram on 05/31/8117 indicated LVEF of 55 to 60%, no RWMA, grade 2 diastolic dysfunction, RV function was normal, size was mildly enlarged.  There was mildly elevated pulmonary artery systolic pressures.  Right atrium was mildly dilated.  Patient presented last month with increased lower extremity edema and shortness of breath, reports this has been progressive over weeks.  Patient was started on Lasix  40 mg twice daily, on follow up she reported feeling significantly better. Echocardiogram 03/11/2023 indicated LVEF of 50 to 55%, no RWMA, diastolic parameters were indeterminate, RV function and size was normal, LA was mildly dilated, there was trivial mitral regurgitation, no evidence of stenosis, tricuspid valve regurgitation was moderate, aortic valve was normal in structure with no evidence of stenosis. Today she reports that she  Persistent atrial fibrillation: Heart rate well-controlled at Continue verapamil  and Eliquis .  Patient denies any bleeding problems on Eliquis . Hypertension: Blood pressure today Continue current antihypertensive regimen. Hyperlipidemia: On Lipitor 40 mg daily.  Labwork independently reviewed:   ROS: .   *** denies chest pain, shortness of breath, lower extremity edema, fatigue, palpitations, melena, hematuria, hemoptysis, diaphoresis, weakness, presyncope, syncope, orthopnea, and PND.  All other systems are reviewed and otherwise negative.  Studies Reviewed: Alyssa Williamson    EKG:  EKG is ordered today, personally reviewed,  demonstrating ***     CV Studies: Cardiac studies reviewed are outlined and summarized above. Otherwise please see EMR for full report. Cardiac Studies & Procedures    ______________________________________________________________________________________________     ECHOCARDIOGRAM  ECHOCARDIOGRAM COMPLETE 06/09/2023  Narrative ECHOCARDIOGRAM REPORT    Patient Name:   Alyssa Williamson Date of Exam: 06/09/2023 Medical Rec #:  [id number]     Height:       62.0 in Accession #:    [id number]    Weight:       183.0 lb Date of Birth:  1931/11/10     BSA:          1.841 m Patient Age:    89 years      BP:           120/58 mmHg Patient Gender: F             HR:           77 bpm. Exam Location:  [facility]  Procedure: 2D Echo, Cardiac Doppler and Color Doppler (Both Spectral and Color Flow Doppler were utilized during procedure).  Indications:    Bilateral lower extremity edema [R60.0 (ICD-10-CM)]; Persistent atrial fibrillation (HCC) [I48.19 (ICD-10-CM)]; SOB (shortness of breath) [R06.02 (ICD-10  History:        Patient has prior history of Echocardiogram examinations, most recent 07/15/2020. CHF, Arrythmias:Atrial Fibrillation, Signs/Symptoms:Murmur and Dyspnea; Risk Factors:Hypertension, Dyslipidemia and Non-Smoker.  Sonographer:    Lyndal Sandy RDMS, RVT, RDCS Referring Phys: [id number] Sire Poet D Avin Gibbons  IMPRESSIONS   1. Left ventricular ejection fraction, by estimation, is 50 to 55%. The left ventricle has low normal function. The left ventricle has no regional wall motion abnormalities. Left ventricular diastolic parameters are indeterminate. 2. Right ventricular systolic function is normal. The right ventricular size is normal. 3. Left atrial size was mildly dilated. 4. The mitral valve is normal in structure. Trivial mitral valve regurgitation. No evidence of mitral stenosis. 5. Tricuspid valve regurgitation is moderate. 6. The aortic valve is normal in structure. Aortic valve regurgitation is not visualized. No aortic stenosis is present. 7. The inferior vena cava is normal in size with greater than 50% respiratory variability, suggesting right  atrial pressure of 3 mmHg.  Comparison(s): Echocardiogram done 07/16/20 showed an EF of 55-60%.  FINDINGS Left Ventricle: Left ventricular ejection fraction, by estimation, is 50 to 55%. The left ventricle has low normal function. The left ventricle has no regional wall motion abnormalities. The left ventricular internal cavity size was normal in size. There is no left ventricular hypertrophy. Left ventricular diastolic parameters are indeterminate.  Right Ventricle: The right ventricular size is normal. No increase in right ventricular wall thickness. Right ventricular systolic function is normal.  Left Atrium: Left atrial size was mildly dilated.  Right Atrium: Right atrial size was normal in size.  Pericardium: There is no evidence of pericardial effusion.  Mitral Valve: The mitral valve is normal in structure. Trivial mitral valve regurgitation. No evidence of mitral valve stenosis.  Tricuspid Valve: The tricuspid valve is normal in structure. Tricuspid valve regurgitation is moderate . No evidence of tricuspid stenosis.  Aortic Valve: The aortic valve is normal in structure. Aortic valve regurgitation is not visualized. No aortic stenosis is present. Aortic valve mean gradient measures 3.4 mmHg. Aortic valve peak gradient measures 5.2 mmHg. Aortic valve area, by VTI measures 1.61 cm.  Pulmonic Valve: The pulmonic valve was normal in structure. Pulmonic valve regurgitation is not visualized. No evidence of pulmonic stenosis.  Aorta: The aortic root is normal in size and structure.  Venous: The inferior vena cava is normal in size with greater than 50% respiratory variability, suggesting right atrial pressure of 3 mmHg.  IAS/Shunts: No atrial level shunt detected by color flow Doppler.   LEFT VENTRICLE PLAX 2D LVIDd:         3.90 cm     Diastology LVIDs:         2.60 cm     LV e' medial:    6.75 cm/s LV PW:         1.20 cm     LV E/e' medial:  15.9 LV IVS:        0.90 cm      LV e' lateral:   9.70 cm/s LVOT diam:     1.70 cm     LV E/e' lateral: 11.1 LV SV:         42 LV SV Index:   23 LVOT Area:     2.27 cm  LV Volumes (MOD) LV vol d, MOD A2C: 36.3 ml LV vol d, MOD A4C: 41.2 ml LV vol s, MOD A2C: 16.9 ml LV vol s, MOD A4C: 18.6 ml LV SV MOD A2C:     19.4 ml LV SV MOD A4C:     41.2 ml LV SV MOD BP:      20.8 ml  RIGHT VENTRICLE RV S prime:     8.99 cm/s TAPSE (M-mode): 1.6 cm  LEFT ATRIUM             Index        RIGHT ATRIUM           Index LA diam:        3.80 cm 2.06 cm/m   RA Area:     18.00 cm LA Vol (A2C):   64.8 ml 35.20 ml/m  RA Volume:   49.20 ml  26.73 ml/m LA Vol (A4C):   64.6 ml 35.09 ml/m LA Biplane Vol: 68.8 ml 37.37 ml/m AORTIC VALVE AV Area (Vmax):    1.73 cm AV Area (Vmean):   1.62 cm AV Area (VTI):     1.61 cm AV Vmax:           113.60 cm/s AV Vmean:          87.260 cm/s AV VTI:            0.260 m AV Peak Grad:      5.2 mmHg AV Mean Grad:      3.4 mmHg LVOT Vmax:         86.40 cm/s LVOT Vmean:        62.100 cm/s LVOT VTI:          0.185 m LVOT/AV VTI ratio: 0.71  AORTA Ao Root diam: 2.80 cm Ao Asc diam:  2.50 cm  MITRAL VALVE                TRICUSPID VALVE MV Area (PHT): 4.81 cm     TR Peak grad:   31.6 mmHg MV Decel Time: 158 msec     TR Vmax:        281.00 cm/s MR Peak grad: 15.7 mmHg MR Vmax:      198.00 cm/s   SHUNTS MV E velocity: 107.22 cm/s  Systemic VTI:  0.18 m MV A velocity: 35.60 cm/s   Systemic Diam: 1.70 cm MV E/A ratio:  3.01  Ralene Burger MD Electronically signed by Ralene Burger MD Signature Date/Time:  06/10/2023/12:00:35 PM    Final          ______________________________________________________________________________________________       Current Reported Medications:.    No outpatient medications have been marked as taking for the 06/20/23 encounter (Appointment) with Fleetwood Pierron D, NP.    Physical Exam:    VS:  There were no vitals taken for this visit.   Wt  Readings from Last 3 Encounters:  05/25/23 183 lb (83 kg)  05/18/23 187 lb 6.4 oz (85 kg)  02/21/23 192 lb (87.1 kg)    GEN: Well nourished, well developed in no acute distress NECK: No JVD; No carotid bruits CARDIAC: ***RRR, no murmurs, rubs, gallops RESPIRATORY:  Clear to auscultation without rales, wheezing or rhonchi  ABDOMEN: Soft, non-tender, non-distended EXTREMITIES:  No edema; No acute deformity     Asessement and Plan:.     ***     Disposition: F/u with ***  Signed, Eitan Doubleday D Infinity Jeffords, NP

## 2023-06-20 ENCOUNTER — Ambulatory Visit: Attending: Cardiology | Admitting: Cardiology

## 2023-06-20 DIAGNOSIS — R0602 Shortness of breath: Secondary | ICD-10-CM

## 2023-06-20 DIAGNOSIS — I5032 Chronic diastolic (congestive) heart failure: Secondary | ICD-10-CM

## 2023-06-20 DIAGNOSIS — E782 Mixed hyperlipidemia: Secondary | ICD-10-CM

## 2023-06-20 DIAGNOSIS — R600 Localized edema: Secondary | ICD-10-CM

## 2023-06-20 DIAGNOSIS — I4819 Other persistent atrial fibrillation: Secondary | ICD-10-CM

## 2023-06-20 DIAGNOSIS — I10 Essential (primary) hypertension: Secondary | ICD-10-CM

## 2023-06-21 ENCOUNTER — Encounter: Payer: Self-pay | Admitting: Cardiology

## 2023-06-24 ENCOUNTER — Telehealth: Payer: Self-pay

## 2023-06-24 NOTE — Telephone Encounter (Signed)
 Left message to call back

## 2023-06-24 NOTE — Telephone Encounter (Signed)
-----   Message from Lorrin Rotter sent at 06/22/2023  5:37 PM EDT ----- Please let Alyssa Williamson know that her echocardiogram showed low normal heart squeeze and function. There was moderate leaking of the tricuspid valve, there was no evidence of narrowing of the valve. The aortic valve showed no evidence of leaking or narrowing. Overall good results, continue current medications and follow up as planned.

## 2023-06-27 ENCOUNTER — Other Ambulatory Visit: Payer: Self-pay | Admitting: Family Medicine

## 2023-06-27 NOTE — Telephone Encounter (Signed)
 Copied from CRM [id number]. Topic: Clinical - Medication Refill >> Jun 27, 2023  8:10 AM Baldomero Bone wrote: Most Recent Primary Care Visit:  Provider: Randie Bustle A  Department: [facility]  Visit Type: OFFICE VISIT  Date: 12/23/2022  Medication: verapamil  (CALAN -SR) 240 MG CR tablet ALPRAZolam  (XANAX ) 0.25 MG tablet  Has the patient contacted their pharmacy? No (Agent: If no, request that the patient contact the pharmacy for the refill. If patient does not wish to contact the pharmacy document the reason why and proceed with request.) (Agent: If yes, when and what did the pharmacy advise?)  Is this the correct pharmacy for this prescription? Yes If no, delete pharmacy and type the correct one.  This is the patient's preferred pharmacy:  [facility] [address] [place], [place] - [address] [place] [address] Phone: [phone] Fax: [phone]   Has the prescription been filled recently? No  Is the patient out of the medication? Yes  Has the patient been seen for an appointment in the last year OR does the patient have an upcoming appointment? Yes  Can we respond through MyChart? No  Agent: Please be advised that Rx refills may take up to 3 business days. We ask that you follow-up with your pharmacy.

## 2023-06-28 MED ORDER — ALPRAZOLAM 0.25 MG PO TABS: 0.2500 mg | ORAL_TABLET | Freq: Every day | ORAL | 2 refills | Status: DC | PRN

## 2023-06-28 MED ORDER — VERAPAMIL HCL ER 240 MG PO TBCR: 240.0000 mg | EXTENDED_RELEASE_TABLET | Freq: Every day | ORAL | 1 refills | Status: DC

## 2023-06-28 NOTE — Telephone Encounter (Signed)
 Left message to call back

## 2023-06-28 NOTE — Telephone Encounter (Signed)
 Requesting: alprazolam  0.25mg  Contract: Yes UDS: 12/23/22 Last Visit: 12/23/2022 Next Visit: 07/07/2023 Last Refill: 03/29/23  Please Advise

## 2023-07-05 ENCOUNTER — Ambulatory Visit: Payer: Self-pay

## 2023-07-05 NOTE — Telephone Encounter (Signed)
-----   Message from Lorrin Rotter sent at 06/22/2023  5:37 PM EDT ----- Please let Alyssa Williamson know that her echocardiogram showed low normal heart squeeze and function. There was moderate leaking of the tricuspid valve, there was no evidence of narrowing of the valve. The aortic valve showed no evidence of leaking or narrowing. Overall good results, continue current medications and follow up as planned.

## 2023-07-05 NOTE — Telephone Encounter (Signed)
 Left message to call back

## 2023-07-07 ENCOUNTER — Encounter: Payer: Self-pay | Admitting: Family Medicine

## 2023-07-07 ENCOUNTER — Ambulatory Visit (INDEPENDENT_AMBULATORY_CARE_PROVIDER_SITE_OTHER): Payer: Medicare Other | Admitting: Family Medicine

## 2023-07-07 VITALS — BP 124/76 | HR 70 | Ht 62.0 in | Wt 181.0 lb

## 2023-07-07 DIAGNOSIS — E7800 Pure hypercholesterolemia, unspecified: Secondary | ICD-10-CM | POA: Diagnosis not present

## 2023-07-07 DIAGNOSIS — R739 Hyperglycemia, unspecified: Secondary | ICD-10-CM | POA: Diagnosis not present

## 2023-07-07 DIAGNOSIS — I10 Essential (primary) hypertension: Secondary | ICD-10-CM

## 2023-07-07 DIAGNOSIS — N189 Chronic kidney disease, unspecified: Secondary | ICD-10-CM

## 2023-07-07 DIAGNOSIS — F430 Acute stress reaction: Secondary | ICD-10-CM

## 2023-07-07 DIAGNOSIS — E039 Hypothyroidism, unspecified: Secondary | ICD-10-CM

## 2023-07-07 MED ORDER — BUSPIRONE HCL 5 MG PO TABS: 5.0000 mg | ORAL_TABLET | Freq: Three times a day (TID) | ORAL | 2 refills | Status: AC

## 2023-07-07 NOTE — Assessment & Plan Note (Signed)
 [id number] acceptable, minimize simple carbs. Increase exercise as tolerated.

## 2023-07-07 NOTE — Patient Instructions (Addendum)
 Vitamin 1000 international units daily   Buspar 5 mg twice daily for a couple days then increase to 3 x a day. After a few weeks if no concerning side effects but not working adequately let us  know and we can consider increasing dose.   Generalized Anxiety Disorder, Adult Generalized anxiety disorder (GAD) is a mental health condition. Unlike normal worries, anxiety related to GAD is not triggered by a specific event. These worries do not fade or get better with time. GAD interferes with relationships, work, and school. GAD symptoms can vary from mild to severe. People with severe GAD can have intense waves of anxiety with physical symptoms that are similar to panic attacks. What are the causes? The exact cause of GAD is not known, but the following are believed to have an impact: Differences in natural brain chemicals. Genes passed down from parents to children. Differences in the way threats are perceived. Development and stress during childhood. Personality. What increases the risk? The following factors may make you more likely to develop this condition: Being female. Having a family history of anxiety disorders. Being very shy. Experiencing very stressful life events, such as the death of a loved one. Having a very stressful family environment. What are the signs or symptoms? People with GAD often worry excessively about many things in their lives, such as their health and family. Symptoms may also include: Mental and emotional symptoms: Worrying excessively about natural disasters. Fear of being late. Difficulty concentrating. Fears that others are judging your performance. Physical symptoms: Fatigue. Headaches, muscle tension, muscle twitches, trembling, or feeling shaky. Feeling like your heart is pounding or beating very fast. Feeling out of breath or like you cannot take a deep breath. Having trouble falling asleep or staying asleep, or experiencing  restlessness. Sweating. Nausea, diarrhea, or irritable bowel syndrome (IBS). Behavioral symptoms: Experiencing erratic moods or irritability. Avoidance of new situations. Avoidance of people. Extreme difficulty making decisions. How is this diagnosed? This condition is diagnosed based on your symptoms and medical history. You will also have a physical exam. Your health care provider may perform tests to rule out other possible causes of your symptoms. To be diagnosed with GAD, a person must have anxiety that: Is out of his or her control. Affects several different aspects of his or her life, such as work and relationships. Causes distress that makes him or her unable to take part in normal activities. Includes at least three symptoms of GAD, such as restlessness, fatigue, trouble concentrating, irritability, muscle tension, or sleep problems. Before your health care provider can confirm a diagnosis of GAD, these symptoms must be present more days than they are not, and they must last for 6 months or longer. How is this treated? This condition may be treated with: Medicine. Antidepressant medicine is usually prescribed for long-term daily control. Anti-anxiety medicines may be added in severe cases, especially when panic attacks occur. Talk therapy (psychotherapy). Certain types of talk therapy can be helpful in treating GAD by providing support, education, and guidance. Options include: Cognitive behavioral therapy (CBT). People learn coping skills and self-calming techniques to ease their physical symptoms. They learn to identify unrealistic thoughts and behaviors and to replace them with more appropriate thoughts and behaviors. Acceptance and commitment therapy (ACT). This treatment teaches people how to be mindful as a way to cope with unwanted thoughts and feelings. Biofeedback. This process trains you to manage your body's response (physiological response) through breathing techniques and  relaxation methods. You will work  with a therapist while machines are used to monitor your physical symptoms. Stress management techniques. These include yoga, meditation, and exercise. A mental health specialist can help determine which treatment is best for you. Some people see improvement with one type of therapy. However, other people require a combination of therapies. Follow these instructions at home: Lifestyle Maintain a consistent routine and schedule. Anticipate stressful situations. Create a plan and allow extra time to work with your plan. Practice stress management or self-calming techniques that you have learned from your therapist or your health care provider. Exercise regularly and spend time outdoors. Eat a healthy diet that includes plenty of vegetables, fruits, whole grains, low-fat dairy products, and lean protein. Do not eat a lot of foods that are high in fat, added sugar, or salt (sodium). Drink plenty of water. Avoid alcohol. Alcohol can increase anxiety. Avoid caffeine and certain over-the-counter cold medicines. These may make you feel worse. Ask your pharmacist which medicines to avoid. General instructions Take over-the-counter and prescription medicines only as told by your health care provider. Understand that you are likely to have setbacks. Accept this and be kind to yourself as you persist to take better care of yourself. Anticipate stressful situations. Create a plan and allow extra time to work with your plan. Recognize and accept your accomplishments, even if you judge them as small. Spend time with people who care about you. Keep all follow-up visits. This is important. Where to find more information [employer] of Mental Health: http://www.maynard.net/ Substance Abuse and Mental Health Services: [employer].pt Contact a health care provider if: Your symptoms do not get better. Your symptoms get worse. You have signs of depression, such as: A  persistently sad or irritable mood. Loss of enjoyment in activities that used to bring you joy. Change in weight or eating. Changes in sleeping habits. Get help right away if: You have thoughts about hurting yourself or others. If you ever feel like you may hurt yourself or others, or have thoughts about taking your own life, get help right away. Go to your nearest emergency department or: Call your local emergency services (911 in the [place] Call a suicide crisis helpline, such as the National Suicide Prevention Lifeline at 415-841-1533 or 988 in the [place] This is open 24 hours a day in the [place] If you're a Veteran: Call 988 and press 1. This is open 24 hours a day. Text the [employer] at (416)813-3663. Summary Generalized anxiety disorder (GAD) is a mental health condition that involves worry that is not triggered by a specific event. People with GAD often worry excessively about many things in their lives, such as their health and family. GAD may cause symptoms such as restlessness, trouble concentrating, sleep problems, frequent sweating, nausea, diarrhea, headaches, and trembling or muscle twitching. A mental health specialist can help determine which treatment is best for you. Some people see improvement with one type of therapy. However, other people require a combination of therapies. This information is not intended to replace advice given to you by your health care provider. Make sure you discuss any questions you have with your health care provider. Document Revised: 09/23/2022 Document Reviewed: 06/01/2020 Elsevier Patient Education  2024 [employer].

## 2023-07-07 NOTE — Assessment & Plan Note (Signed)
 She has had a very rough month when her daguther who is unable to care for herself had a fall and had an L2 fracture and has needed special care and her daughter is now at [facility] but has really increased her agitation and anxiety. Start Buspar bid and increase to tid

## 2023-07-07 NOTE — Assessment & Plan Note (Signed)
 On Levothyroxine, continue to monitor

## 2023-07-07 NOTE — Assessment & Plan Note (Signed)
 Encourage heart healthy diet such as MIND or DASH diet, increase exercise, avoid trans fats, simple carbohydrates and processed foods, consider a krill or fish or flaxseed oil cap daily.

## 2023-07-07 NOTE — Assessment & Plan Note (Signed)
 Well controlled, no changes to meds. Encouraged heart healthy diet such as the DASH diet and exercise as tolerated.

## 2023-07-07 NOTE — Assessment & Plan Note (Signed)
 Hydrate and monitor

## 2023-07-08 LAB — CBC WITH DIFFERENTIAL/PLATELET
Basophils Absolute: 0.1 10*3/uL (ref 0.0–0.1)
Basophils Relative: 0.8 % (ref 0.0–3.0)
Eosinophils Absolute: 0.1 10*3/uL (ref 0.0–0.7)
Eosinophils Relative: 1.7 % (ref 0.0–5.0)
HCT: 37.9 % (ref 36.0–46.0)
Hemoglobin: 12.7 g/dL (ref 12.0–15.0)
Lymphocytes Relative: 14.2 % (ref 12.0–46.0)
Lymphs Abs: 0.9 10*3/uL (ref 0.7–4.0)
MCHC: 33.4 g/dL (ref 30.0–36.0)
MCV: 98.4 fl (ref 78.0–100.0)
Monocytes Absolute: 0.6 10*3/uL (ref 0.1–1.0)
Monocytes Relative: 9.9 % (ref 3.0–12.0)
Neutro Abs: 4.8 10*3/uL (ref 1.4–7.7)
Neutrophils Relative %: 73.4 % (ref 43.0–77.0)
Platelets: 192 10*3/uL (ref 150.0–400.0)
RBC: 3.86 Mil/uL — ABNORMAL LOW (ref 3.87–5.11)
RDW: 15.3 % (ref 11.5–15.5)
WBC: 6.5 10*3/uL (ref 4.0–10.5)

## 2023-07-08 LAB — COMPREHENSIVE METABOLIC PANEL WITH GFR
ALT: 20 U/L (ref 0–35)
AST: 24 U/L (ref 0–37)
Albumin: 3.9 g/dL (ref 3.5–5.2)
Alkaline Phosphatase: 100 U/L (ref 39–117)
BUN: 19 mg/dL (ref 6–23)
CO2: 29 meq/L (ref 19–32)
Calcium: 9.1 mg/dL (ref 8.4–10.5)
Chloride: 97 meq/L (ref 96–112)
Creatinine, Ser: 1.3 mg/dL — ABNORMAL HIGH (ref 0.40–1.20)
GFR: 36.26 mL/min — ABNORMAL LOW (ref >60.00)
Glucose, Bld: 164 mg/dL — ABNORMAL HIGH (ref 70–99)
Potassium: 4.5 meq/L (ref 3.5–5.1)
Sodium: 136 meq/L (ref 135–145)
Total Bilirubin: 1.3 mg/dL — ABNORMAL HIGH (ref 0.2–1.2)
Total Protein: 6.8 g/dL (ref 6.0–8.3)

## 2023-07-08 LAB — LIPID PANEL
Cholesterol: 122 mg/dL (ref 0–200)
HDL: 58.4 mg/dL (ref >39.00)
LDL Cholesterol: 55 mg/dL (ref 0–99)
NonHDL: 63.63
Total CHOL/HDL Ratio: 2
Triglycerides: 43 mg/dL (ref 0.0–149.0)
VLDL: 8.6 mg/dL (ref 0.0–40.0)

## 2023-07-08 LAB — HEMOGLOBIN A1C: Hgb A1c MFr Bld: 5.8 % (ref 4.6–6.5)

## 2023-07-10 NOTE — Progress Notes (Signed)
 Subjective:    Patient ID: Alyssa Williamson, female    DOB: 09-08-1931, 89 y.o.   MRN: [id number]  Chief Complaint  Patient presents with  . Medical Management of Chronic Issues    Patient presents today for a follow-up appointment.  . Quality Metric Gaps    AWV,zoster    HPI Discussed the use of AI scribe software for clinical note transcription with the patient, who gave verbal consent to proceed.  History of Present Illness Alyssa Williamson is a 89 year old female who presents with anxiety and stress related to caregiving responsibilities.  She experiences significant anxiety and stress due to her caregiving responsibilities for her daughter, who sustained a fracture in her L2 vertebra following a fall. Her daughter required multiple hospitalizations due to pain and is currently at [facility] awaiting a hospital bed. This situation has led to 'constant adrenaline overload' and difficulty managing her own well-being.  Her daughter was initially treated with fentanyl  for five days during her hospitalizations and is currently on Tylenol  for pain management. She has been actively involved in her daughter's care, including managing her belongings and ensuring a clean environment free from pests.  She feels exhausted but is able to sleep due to fatigue from her daily activities. No recent major illnesses, emergency room visits, chest pain, constipation, or new abdominal issues. She has been taking potassium supplements. Her family history includes supportive sisters who reside in [place] , providing emotional support through phone calls. Her role as a primary caregiver for her daughter has been a significant source of stress and anxiety.    Past Medical History:  Diagnosis Date  . Allergic rhinitis   . Anemia   . Anxiety   . Arthritis    bilateral knee  . Atrial fibrillation (HCC) 12/21/2016  . Cervical cancer screening 10/30/2013   Menarche at 15 Regular and moderate flow No history of  abnormal pap in past G4P3, s/p 3 svd and 1 MC No history of abnormal MGM Noconcerns today No gyn surgeries  . Constipation 11/18/2015  . Depression with anxiety   . Diverticulitis   . Heart murmur   . Hyperlipidemia   . Hypertension   . Hypothyroid   . Left knee DJD   . Medicare annual wellness visit, subsequent 10/30/2013  . PONV (postoperative nausea and vomiting)   . Preventative health care 05/20/2016    Past Surgical History:  Procedure Laterality Date  . ABDOMINAL HYSTERECTOMY    . bladder tack  04/2010   Dr Margaretta Shaw  . ERCP N/A 07/16/2020   Procedure: ENDOSCOPIC RETROGRADE CHOLANGIOPANCREATOGRAPHY (ERCP);  Surgeon: Genell Ken, MD;  Location: [facility];  Service: Gastroenterology;  Laterality: N/A;  . GANGLION CYST EXCISION    . JOINT REPLACEMENT Right   . REMOVAL OF STONES  07/16/2020   Procedure: REMOVAL OF STONES;  Surgeon: Genell Ken, MD;  Location: [facility];  Service: Gastroenterology;;  . REPLACEMENT TOTAL KNEE  10/2010   right knee-[facility]   . SPHINCTEROTOMY  07/16/2020   Procedure: SPHINCTEROTOMY;  Surgeon: Genell Ken, MD;  Location: [facility];  Service: Gastroenterology;;  . TONSILLECTOMY    . TOTAL KNEE ARTHROPLASTY Left 07/26/2012   Procedure: TOTAL KNEE ARTHROPLASTY;  Surgeon: Ferd Householder, MD;  Location: [facility] OR;  Service: Orthopedics;  Laterality: Left;    Family History  Problem Relation Age of Onset  . Alcohol abuse Father   . Breast cancer Sister   . Cancer Sister  bladder  . Glaucoma Sister   . Arthritis Daughter   . Diabetes Daughter        type 2  . Cancer Daughter        brain cancer, diagnosed 29 years.agp  . Mental illness Daughter        s/p craniotomies, gamma knife for tumors. numerous shunts.  . Heart failure Mother   . Cancer Brother        glioblastoma   . Glaucoma Brother   . Diverticulosis Sister   . Cancer Sister        liver  . Cancer Brother        melanoma, mets to lung and brain and intestine  . Alcohol  abuse Brother   . Cancer Brother        liver  . Glaucoma Maternal Grandfather   . Cancer Sister        breast  . Colon cancer Neg Hx   . Hypertension Neg Hx     Social History   Socioeconomic History  . Marital status: Widowed    Spouse name: Not on file  . Number of children: 3  . Years of education: Not on file  . Highest education level: Not on file  Occupational History  . Not on file  Tobacco Use  . Smoking status: Never  . Smokeless tobacco: Never  Vaping Use  . Vaping status: Never Used  Substance and Sexual Activity  . Alcohol use: No  . Drug use: No  . Sexual activity: Not on file  Other Topics Concern  . Not on file  Social History Narrative  . Not on file   Social Drivers of Health   Financial Resource Strain: Low Risk  (02/11/2022)   Overall Financial Resource Strain (CARDIA)   . Difficulty of Paying Living Expenses: Not hard at all  Food Insecurity: No Food Insecurity (02/11/2022)   Hunger Vital Sign   . Worried About Programme researcher, broadcasting/film/video in the Last Year: Never true   . Ran Out of Food in the Last Year: Never true  Transportation Needs: No Transportation Needs (02/11/2022)   PRAPARE - Transportation   . Lack of Transportation (Medical): No   . Lack of Transportation (Non-Medical): No  Physical Activity: Inactive (02/11/2022)   Exercise Vital Sign   . Days of Exercise per Week: 0 days   . Minutes of Exercise per Session: 0 min  Stress: No Stress Concern Present (02/11/2022)   [employer] of Occupational Health - Occupational Stress Questionnaire   . Feeling of Stress : Not at all  Social Connections: Moderately Integrated (02/11/2022)   Social Connection and Isolation Panel [NHANES]   . Frequency of Communication with Friends and Family: More than three times a week   . Frequency of Social Gatherings with Friends and Family: Once a week   . Attends Religious Services: More than 4 times per year   . Active Member of Clubs or Organizations:  Yes   . Attends Banker Meetings: More than 4 times per year   . Marital Status: Widowed  Intimate Partner Violence: Not At Risk (02/11/2022)   Humiliation, Afraid, Rape, and Kick questionnaire   . Fear of Current or Ex-Partner: No   . Emotionally Abused: No   . Physically Abused: No   . Sexually Abused: No    Outpatient Medications Prior to Visit  Medication Sig Dispense Refill  . acetaminophen  (TYLENOL ) 325 MG tablet Take 2 tablets (650 mg total)  by mouth every 6 (six) hours as needed for mild pain (or Fever >/= 101).    . albuterol  (VENTOLIN  HFA) 108 (90 Base) MCG/ACT inhaler Inhale 1 puff into the lungs every 6 (six) hours as needed for wheezing or shortness of breath. 30 each 2  . ALPRAZolam  (XANAX ) 0.25 MG tablet Take 1 tablet (0.25 mg total) by mouth daily as needed for anxiety. 30 tablet 2  . apixaban  (ELIQUIS ) 5 MG TABS tablet Take 1 tablet (5 mg total) by mouth 2 (two) times daily. 60 tablet 5  . atorvastatin  (LIPITOR) 40 MG tablet TAKE 1 TABLET BY MOUTH ONCE DAILY APPT  NEEDED  FOR  FUTURE  REFILLS 90 tablet 3  . citalopram  (CELEXA ) 20 MG tablet Take 1 tablet (20 mg total) by mouth daily. 90 tablet 1  . furosemide  (LASIX ) 20 MG tablet Take 40 mg (two tablets) in the morning, take 20 mg (one tablet) in the afternoon. 270 tablet 3  . levothyroxine  (SYNTHROID ) 75 MCG tablet Take 1 tablet (75 mcg total) by mouth daily before breakfast. 90 tablet 1  . loratadine  (CLARITIN ) 10 MG tablet Take 10 mg by mouth daily.    . Multiple Vitamins-Minerals (CENTRUM SILVER PO) Take 1 tablet by mouth daily.    . potassium chloride  SA (KLOR-CON  M) 20 MEQ tablet Take 1 tablet (20 mEq total) by mouth daily. 30 tablet 6  . verapamil  (CALAN -SR) 240 MG CR tablet Take 1 tablet (240 mg total) by mouth daily. 90 tablet 1   No facility-administered medications prior to visit.    Allergies  Allergen Reactions  . Cefdinir  Dermatitis  . Codeine Nausea And Vomiting    Review of Systems   Constitutional:  Positive for malaise/fatigue. Negative for fever.  HENT:  Negative for congestion.   Eyes:  Negative for blurred vision.  Respiratory:  Negative for shortness of breath.   Cardiovascular:  Negative for chest pain, palpitations and leg swelling.  Gastrointestinal:  Negative for abdominal pain, blood in stool and nausea.  Genitourinary:  Negative for dysuria and frequency.  Musculoskeletal:  Negative for falls.  Skin:  Negative for rash.  Neurological:  Negative for dizziness, loss of consciousness and headaches.  Endo/Heme/Allergies:  Negative for environmental allergies.  Psychiatric/Behavioral:  Negative for depression. The patient is nervous/anxious.        Objective:     Physical Exam Constitutional:      General: She is in acute distress.     Appearance: Normal appearance. She is well-developed. She is not toxic-appearing.  HENT:     Head: Normocephalic and atraumatic.     Right Ear: External ear normal.     Left Ear: External ear normal.     Nose: Nose normal.  Eyes:     General:        Right eye: No discharge.        Left eye: No discharge.     Conjunctiva/sclera: Conjunctivae normal.  Neck:     Thyroid : No thyromegaly.  Cardiovascular:     Rate and Rhythm: Normal rate and regular rhythm.     Heart sounds: Normal heart sounds. No murmur heard. Pulmonary:     Effort: Pulmonary effort is normal. No respiratory distress.     Breath sounds: Normal breath sounds.  Abdominal:     General: Bowel sounds are normal.     Palpations: Abdomen is soft.     Tenderness: There is no abdominal tenderness. There is no guarding.  Musculoskeletal:  General: Normal range of motion.     Cervical back: Neck supple.  Lymphadenopathy:     Cervical: No cervical adenopathy.  Skin:    General: Skin is warm and dry.  Neurological:     Mental Status: She is alert and oriented to person, place, and time.  Psychiatric:        Mood and Affect: Mood normal.         Behavior: Behavior normal.        Thought Content: Thought content normal.        Judgment: Judgment normal.    BP 124/76   Pulse 70   Ht 5\' 2"  (1.575 m)   Wt 181 lb (82.1 kg)   SpO2 96%   BMI 33.11 kg/m  Wt Readings from Last 3 Encounters:  07/07/23 181 lb (82.1 kg)  05/25/23 183 lb (83 kg)  05/18/23 187 lb 6.4 oz (85 kg)    Diabetic Foot Exam - Simple   No data filed    Lab Results  Component Value Date   WBC 7.8 12/23/2022   HGB 14.5 12/23/2022   HCT 44.7 12/23/2022   PLT 174.0 12/23/2022   GLUCOSE 96 05/24/2023   CHOL 160 12/23/2022   TRIG 78.0 12/23/2022   HDL 68.50 12/23/2022   LDLCALC 76 12/23/2022   ALT 31 12/23/2022   AST 23 12/23/2022   NA 141 05/24/2023   K 4.1 05/24/2023   CL 100 05/24/2023   CREATININE 1.21 (H) 05/24/2023   BUN 19 05/24/2023   CO2 24 05/24/2023   TSH 2.04 12/23/2022   INR 0.99 07/24/2012   HGBA1C 6.3 12/23/2022    Lab Results  Component Value Date   TSH 2.04 12/23/2022   Lab Results  Component Value Date   WBC 7.8 12/23/2022   HGB 14.5 12/23/2022   HCT 44.7 12/23/2022   MCV 100.1 (H) 12/23/2022   PLT 174.0 12/23/2022   Lab Results  Component Value Date   NA 141 05/24/2023   K 4.1 05/24/2023   CO2 24 05/24/2023   GLUCOSE 96 05/24/2023   BUN 19 05/24/2023   CREATININE 1.21 (H) 05/24/2023   BILITOT 1.2 12/23/2022   ALKPHOS 88 12/23/2022   AST 23 12/23/2022   ALT 31 12/23/2022   PROT 6.6 12/23/2022   ALBUMIN  3.8 12/23/2022   CALCIUM  9.1 05/24/2023   ANIONGAP 4 (L) 07/20/2020   EGFR 43 (L) 05/24/2023   GFR 45.46 (L) 12/23/2022   Lab Results  Component Value Date   CHOL 160 12/23/2022   Lab Results  Component Value Date   HDL 68.50 12/23/2022   Lab Results  Component Value Date   LDLCALC 76 12/23/2022   Lab Results  Component Value Date   TRIG 78.0 12/23/2022   Lab Results  Component Value Date   CHOLHDL 2 12/23/2022   Lab Results  Component Value Date   HGBA1C 6.3 12/23/2022        Assessment & Plan:  Primary hypertension Assessment & Plan: Well controlled, no changes to meds. Encouraged heart healthy diet such as the DASH diet and exercise as tolerated.   Orders: -     CBC with Differential/Platelet -     TSH  Chronic renal impairment, unspecified CKD stage Assessment & Plan: Hydrate and monitor   Orders: -     Comprehensive metabolic panel with GFR  Hyperglycemia Assessment & Plan: hgba1c acceptable, minimize simple carbs. Increase exercise as tolerated.   Orders: -     Hemoglobin  A1c  Pure hypercholesterolemia Assessment & Plan: Encourage heart healthy diet such as MIND or DASH diet, increase exercise, avoid trans fats, simple carbohydrates and processed foods, consider a krill or fish or flaxseed oil cap daily.   Orders: -     Lipid panel  Hypothyroidism, unspecified type Assessment & Plan: On Levothyroxine , continue to monitor    Stress reaction Assessment & Plan: She has had a very rough month when her daguther who is unable to care for herself had a fall and had an L2 fracture and has needed special care and her daughter is now at [facility] but has really increased her agitation and anxiety. Start Buspar  bid and increase to tid   Other orders -     busPIRone  HCl; Take 1 tablet (5 mg total) by mouth 3 (three) times daily.  Dispense: 90 tablet; Refill: 2    Assessment and Plan Assessment & Plan Anxiety Anxiety exacerbated by stressors. Buspar  preferred over Xanax  due to lower risk of confusion and falls. - Initiate Buspar  5 mg orally twice daily, increase to three times daily if tolerated. - Continue Alprazolam  0.25 mg orally as needed, minimize use. - Follow-up in 10-12 weeks to assess Buspar  efficacy, option for virtual or phone consultation. - Report side effects or inadequate relief for dose adjustment.  Vitamin D  deficiency Vitamin D  levels low normal. Supplementation beneficial for bone health and fatigue. - Recommend Vitamin  D 1000 IU daily.      Randie Bustle, MD

## 2023-07-12 ENCOUNTER — Ambulatory Visit: Payer: Self-pay | Admitting: Family Medicine

## 2023-07-12 DIAGNOSIS — N289 Disorder of kidney and ureter, unspecified: Secondary | ICD-10-CM

## 2023-07-12 LAB — TSH: TSH: 2.48 u[IU]/mL (ref 0.35–5.50)

## 2023-07-16 NOTE — Progress Notes (Signed)
 HPI: FU atrial fibrillation. Nuclear study May 2014 showed breast attenuation but no ischemia. Ejection fraction 79%. Noted to be in new onset atrial 9/18. Seen in atrial fibrillation clinic; treated with rate control and anticoagulation. TSH normal. Echocardiogram April 2025 showed ejection fraction 50 to 55% mild left atrial enlargement, moderate tricuspid regurgitation.  Since last seen, she denies increased dyspnea, chest pain, palpitations, syncope or bleeding.  She has chronic pedal edema.  Current Outpatient Medications  Medication Sig Dispense Refill   acetaminophen  (TYLENOL ) 325 MG tablet Take 2 tablets (650 mg total) by mouth every 6 (six) hours as needed for mild pain (or Fever >/= 101).     albuterol  (VENTOLIN  HFA) 108 (90 Base) MCG/ACT inhaler Inhale 1 puff into the lungs every 6 (six) hours as needed for wheezing or shortness of breath. 30 each 2   ALPRAZolam  (XANAX ) 0.25 MG tablet Take 1 tablet (0.25 mg total) by mouth daily as needed for anxiety. 30 tablet 2   apixaban  (ELIQUIS ) 5 MG TABS tablet Take 1 tablet (5 mg total) by mouth 2 (two) times daily. 60 tablet 5   atorvastatin  (LIPITOR) 40 MG tablet TAKE 1 TABLET BY MOUTH ONCE DAILY . APPOINTMENT REQUIRED FOR FUTURE REFILLS 90 tablet 3   busPIRone  (BUSPAR ) 5 MG tablet Take 1 tablet (5 mg total) by mouth 3 (three) times daily. 90 tablet 2   citalopram  (CELEXA ) 20 MG tablet Take 1 tablet (20 mg total) by mouth daily. 90 tablet 1   furosemide  (LASIX ) 20 MG tablet Take 40 mg (two tablets) in the morning, take 20 mg (one tablet) in the afternoon. 270 tablet 3   levothyroxine  (SYNTHROID ) 75 MCG tablet Take 1 tablet (75 mcg total) by mouth daily before breakfast. 90 tablet 1   loratadine  (CLARITIN ) 10 MG tablet Take 10 mg by mouth daily.     Multiple Vitamins-Minerals (CENTRUM SILVER PO) Take 1 tablet by mouth daily.     potassium chloride  SA (KLOR-CON  M) 20 MEQ tablet Take 1 tablet (20 mEq total) by mouth daily. 30 tablet 6    verapamil  (CALAN -SR) 240 MG CR tablet Take 1 tablet (240 mg total) by mouth daily. 90 tablet 1   No current facility-administered medications for this visit.     Past Medical History:  Diagnosis Date   Allergic rhinitis    Anemia    Anxiety    Arthritis    bilateral knee   Atrial fibrillation (HCC) 12/21/2016   Cervical cancer screening 10/30/2013   Menarche at 15 Regular and moderate flow No history of abnormal pap in past G4P3, s/p 3 svd and 1 MC No history of abnormal MGM Noconcerns today No gyn surgeries   Constipation 11/18/2015   Depression with anxiety    Diverticulitis    Heart murmur    Hyperlipidemia    Hypertension    Hypothyroid    Left knee DJD    Medicare annual wellness visit, subsequent 10/30/2013   PONV (postoperative nausea and vomiting)    Preventative health care 05/20/2016    Past Surgical History:  Procedure Laterality Date   ABDOMINAL HYSTERECTOMY     bladder tack  04/2010   Dr Margaretta Shaw   ERCP N/A 07/16/2020   Procedure: ENDOSCOPIC RETROGRADE CHOLANGIOPANCREATOGRAPHY (ERCP);  Surgeon: Genell Ken, MD;  Location: [facility];  Service: Gastroenterology;  Laterality: N/A;   GANGLION CYST EXCISION     JOINT REPLACEMENT Right    REMOVAL OF STONES  07/16/2020   Procedure: REMOVAL  OF STONES;  Surgeon: Genell Ken, MD;  Location: [facility];  Service: Gastroenterology;;   REPLACEMENT TOTAL KNEE  10/2010   right knee-Murphy    SPHINCTEROTOMY  07/16/2020   Procedure: SPHINCTEROTOMY;  Surgeon: Genell Ken, MD;  Location: [facility];  Service: Gastroenterology;;   TONSILLECTOMY     TOTAL KNEE ARTHROPLASTY Left 07/26/2012   Procedure: TOTAL KNEE ARTHROPLASTY;  Surgeon: Ferd Householder, MD;  Location: [facility];  Service: Orthopedics;  Laterality: Left;    Social History   Socioeconomic History   Marital status: Widowed    Spouse name: Not on file   Number of children: 3   Years of education: Not on file   Highest education level: Not on file   Occupational History   Not on file  Tobacco Use   Smoking status: Never   Smokeless tobacco: Never  Vaping Use   Vaping status: Never Used  Substance and Sexual Activity   Alcohol use: No   Drug use: No   Sexual activity: Not on file  Other Topics Concern   Not on file  Social History Narrative   Not on file   Social Drivers of Health   Financial Resource Strain: Low Risk  (02/11/2022)   Overall Financial Resource Strain (CARDIA)    Difficulty of Paying Living Expenses: Not hard at all  Food Insecurity: No Food Insecurity (02/11/2022)   Hunger Vital Sign    Worried About Running Out of Food in the Last Year: Never true    Ran Out of Food in the Last Year: Never true  Transportation Needs: No Transportation Needs (02/11/2022)   PRAPARE - Administrator, Civil Service (Medical): No    Lack of Transportation (Non-Medical): No  Physical Activity: Inactive (02/11/2022)   Exercise Vital Sign    Days of Exercise per Week: 0 days    Minutes of Exercise per Session: 0 min  Stress: No Stress Concern Present (02/11/2022)   [employer] of Occupational Health - Occupational Stress Questionnaire    Feeling of Stress : Not at all  Social Connections: Moderately Integrated (02/11/2022)   Social Connection and Isolation Panel [NHANES]    Frequency of Communication with Friends and Family: More than three times a week    Frequency of Social Gatherings with Friends and Family: Once a week    Attends Religious Services: More than 4 times per year    Active Member of [employer] or Organizations: Yes    Attends Banker Meetings: More than 4 times per year    Marital Status: Widowed  Intimate Partner Violence: Not At Risk (02/11/2022)   Humiliation, Afraid, Rape, and Kick questionnaire    Fear of Current or Ex-Partner: No    Emotionally Abused: No    Physically Abused: No    Sexually Abused: No    Family History  Problem Relation Age of Onset   Alcohol abuse  Father    Breast cancer Sister    Cancer Sister        bladder   Glaucoma Sister    Arthritis Daughter    Diabetes Daughter        type 2   Cancer Daughter        brain cancer, diagnosed 29 years.agp   Mental illness Daughter        s/p craniotomies, gamma knife for tumors. numerous shunts.   Heart failure Mother    Cancer Brother        glioblastoma  Glaucoma Brother    Diverticulosis Sister    Cancer Sister        liver   Cancer Brother        melanoma, mets to lung and brain and intestine   Alcohol abuse Brother    Cancer Brother        liver   Glaucoma Maternal Grandfather    Cancer Sister        breast   Colon cancer Neg Hx    Hypertension Neg Hx     ROS: no fevers or chills, productive cough, hemoptysis, dysphasia, odynophagia, melena, hematochezia, dysuria, hematuria, rash, seizure activity, orthopnea, PND, claudication. Remaining systems are negative.  Physical Exam: Well-developed well-nourished in no acute distress.  Skin is warm and dry.  HEENT is normal.  Neck is supple.  Chest is clear to auscultation with normal expansion.  Cardiovascular exam is irregular Abdominal exam nontender or distended. No masses palpated. Extremities show 1+ edema. neuro grossly intact  A/P  1 persistent atrial fibrillation-continue verapamil  and apixaban .  We previously discussed attempt at cardioversion but she declined preferring only conservative measures.  2 hypertension-blood pressure controlled.  Continue present medications.  3 chronic diastolic congestive heart failure-continue Lasix  at present dose.  She can take an additional 20 mg daily as needed for worsening pedal edema.  4 hyperlipidemia-continue statin.  Alexandria Angel, MD

## 2023-07-20 NOTE — Telephone Encounter (Signed)
 Called patient advised of below they verbalized understanding.

## 2023-07-20 NOTE — Telephone Encounter (Signed)
-----   Message from Lorrin Rotter sent at 06/22/2023  5:37 PM EDT ----- Please let Alyssa Williamson know that her echocardiogram showed low normal heart squeeze and function. There was moderate leaking of the tricuspid valve, there was no evidence of narrowing of the valve. The aortic valve showed no evidence of leaking or narrowing. Overall good results, continue current medications and follow up as planned.

## 2023-07-21 ENCOUNTER — Ambulatory Visit: Payer: Self-pay

## 2023-07-21 NOTE — Telephone Encounter (Signed)
 Received a CRM from specialist and returned call to patient to assess. Patient states Dr. Rodrick Clapper knows her situation with her daughter, and previously placed her on Buspar . Patient states Buspar  did nothing and did not help her at all so she stopped taking it. Therefore; patient states she has had to increase her xanax  to two times a day and has run out early. Patient asking if prescription can be increased to two times daily and can be called into the [facility] on [address]. Please advise.  Copied from CRM [id number]. Topic: Clinical - Medication Question >> Jul 21, 2023  4:59 PM Hamp Levine R wrote: Reason for CRM: Patient states she is out of ALPRAZolam  (XANAX ) 0.25 MG tablet. Has had a difficult month with her daughter and most days has taken 2 tablets a day. Pharmacy will not refill the prescription unless it is updated to 2 a day.   States prescription busPIRone  (BUSPAR ) 5 MG tablet did not help and she has not take them.  Patient can be reached at [phone]

## 2023-07-22 ENCOUNTER — Other Ambulatory Visit: Payer: Self-pay | Admitting: Cardiology

## 2023-07-22 DIAGNOSIS — E7800 Pure hypercholesterolemia, unspecified: Secondary | ICD-10-CM

## 2023-07-29 ENCOUNTER — Ambulatory Visit: Payer: Medicare Other | Attending: Cardiology | Admitting: Cardiology

## 2023-07-29 ENCOUNTER — Encounter: Payer: Self-pay | Admitting: Cardiology

## 2023-07-29 VITALS — BP 110/60 | HR 89 | Ht 62.0 in | Wt 179.0 lb

## 2023-07-29 DIAGNOSIS — R600 Localized edema: Secondary | ICD-10-CM

## 2023-07-29 DIAGNOSIS — I10 Essential (primary) hypertension: Secondary | ICD-10-CM

## 2023-07-29 DIAGNOSIS — I5032 Chronic diastolic (congestive) heart failure: Secondary | ICD-10-CM | POA: Diagnosis not present

## 2023-07-29 DIAGNOSIS — I4819 Other persistent atrial fibrillation: Secondary | ICD-10-CM

## 2023-07-29 NOTE — Patient Instructions (Signed)

## 2023-09-19 ENCOUNTER — Other Ambulatory Visit: Payer: Self-pay | Admitting: Cardiology

## 2023-09-19 DIAGNOSIS — I4891 Unspecified atrial fibrillation: Secondary | ICD-10-CM

## 2023-09-19 NOTE — Telephone Encounter (Signed)
 Prescription refill request for Eliquis  received. Indication:Afib  Last office visit: 07/29/23 [place])  Scr: 1.30 (07/07/23)  Age: 89 Weight: 81.2kg  Appropriate dose. Refill sent.

## 2023-09-22 ENCOUNTER — Other Ambulatory Visit: Payer: Self-pay | Admitting: Family Medicine

## 2023-09-23 NOTE — Telephone Encounter (Signed)
 Requesting: Xanax  0.25 MG Contract: 08/12/22 UDS: 12/23/22 Last Visit: 07/07/2023 Next Visit:10/07/23 Last Refill: 06/28/23  Please Advise

## 2023-10-02 NOTE — Assessment & Plan Note (Addendum)
 Follows with cardiology. No recent exacerbations. Echocardiogram April 2025 showed ejection fraction 50 to 55% mild left atrial enlargement, moderate tricuspid regurgitation.

## 2023-10-02 NOTE — Progress Notes (Signed)
 Subjective:     Patient ID: Alyssa Williamson, female    DOB: [birth year], 89 y.o.   MRN: [id number]  Chief Complaint  Patient presents with   Follow-up    HPI  Alyssa Williamson is a 89 year old female presents for follow-up chronic conditions. Patient reports that she lost her daughter to ovarian cancer in July 2025 and has experienced significant grief since her passing. She is not currently engaged in counseling or grief therapy but states she has strong support from family, friends, and her church. She feels she is coping as well as possible, finds comfort in knowing her daughter is no longer in pain, and maintains a positive outlook. Patient lives alone, continues to drive, and reports having appropriate support in place.    Patient Care Team: Domenica Harlene LABOR, MD as PCP - General (Family Medicine) Pietro, Redell RAMAN, MD as PCP - Cardiology (Cardiology) Aleene Kitchens, MD (Inactive) as Consulting Physician (Urology) Reinaldo Pleasant Franchot DEVONNA (Dermatology) Pietro Redell RAMAN, MD as Consulting Physician (Cardiology)    Asthma-albuterol  as needed  Anxiety/ Depression  Citalopram  (Celexa ) 20 mg daily alprazolam  0.25 mg once daily as needed BuSpar  5 mg p.o. 3 times daily  Last OV Pt was placed on Buspar  TID, she reports this has helped  HTN BP Readings from Last 1 Encounters:  10/07/23 122/78     A-fib- follows with Cardiology, Dr. Pietro verapamil  240 mg daily Eliquis  5 mg twice daily Per cardiology-discussed cardioversion last OV with cardiology.  Patient declined cardioversion and desires conservative treatment.   CHF-Lasix  40 mg a.m., 20 mg afternoon daily. Per cardiology- patient can take additional 20 mg as needed for worsening pedal edema.  HLD-atorvastatin  40 mg daily  Hypothyroidism-levothyroxine  75mcg daily  Allergies-Claritin  10 mg daily  Patient denies fever, chills, SOB, CP, palpitations, dyspnea, edema, HA, vision changes, N/V/D, abdominal pain, urinary  symptoms, rash, weight changes, and recent illness or hospitalizations.      History of Present Illness              Health Maintenance Due  Topic Date Due   Medicare Annual Wellness (AWV)  02/12/2023    Past Medical History:  Diagnosis Date   Allergic rhinitis    Anemia    Anxiety    Arthritis    bilateral knee   Atrial fibrillation (HCC) 12/21/2016   Cervical cancer screening 10/30/2013   Menarche at 15 Regular and moderate flow No history of abnormal pap in past G4P3, s/p 3 svd and 1 MC No history of abnormal MGM Noconcerns today No gyn surgeries   Constipation 11/18/2015   Depression with anxiety    Diverticulitis    Heart murmur    Hyperlipidemia    Hypertension    Hypothyroid    Left knee DJD    Medicare annual wellness visit, subsequent 10/30/2013   PONV (postoperative nausea and vomiting)    Preventative health care 05/20/2016    Past Surgical History:  Procedure Laterality Date   ABDOMINAL HYSTERECTOMY     bladder tack  04/2010   Dr Evalene Nigh   ERCP N/A 07/16/2020   Procedure: ENDOSCOPIC RETROGRADE CHOLANGIOPANCREATOGRAPHY (ERCP);  Surgeon: Saintclair Jasper, MD;  Location: [facility];  Service: Gastroenterology;  Laterality: N/A;   GANGLION CYST EXCISION     JOINT REPLACEMENT Right    REMOVAL OF STONES  07/16/2020   Procedure: REMOVAL OF STONES;  Surgeon: Saintclair Jasper, MD;  Location: [facility];  Service: Gastroenterology;;   REPLACEMENT TOTAL KNEE  10/2010   right knee-Murphy    SPHINCTEROTOMY  07/16/2020   Procedure: SPHINCTEROTOMY;  Surgeon: Saintclair Jasper, MD;  Location: [facility];  Service: Gastroenterology;;   TONSILLECTOMY     TOTAL KNEE ARTHROPLASTY Left 07/26/2012   Procedure: TOTAL KNEE ARTHROPLASTY;  Surgeon: Toribio JULIANNA Chancy, MD;  Location: [facility];  Service: Orthopedics;  Laterality: Left;    Family History  Problem Relation Age of Onset   Alcohol abuse Father    Breast cancer Sister    Cancer Sister        bladder   Glaucoma Sister     Arthritis Daughter    Diabetes Daughter        type 2   Cancer Daughter        brain cancer, diagnosed 29 years.agp   Mental illness Daughter        s/p craniotomies, gamma knife for tumors. numerous shunts.   Heart failure Mother    Cancer Brother        glioblastoma    Glaucoma Brother    Diverticulosis Sister    Cancer Sister        liver   Cancer Brother        melanoma, mets to lung and brain and intestine   Alcohol abuse Brother    Cancer Brother        liver   Glaucoma Maternal Grandfather    Cancer Sister        breast   Colon cancer Neg Hx    Hypertension Neg Hx     Social History   Socioeconomic History   Marital status: Widowed    Spouse name: Not on file   Number of children: 3   Years of education: Not on file   Highest education level: Not on file  Occupational History   Not on file  Tobacco Use   Smoking status: Never   Smokeless tobacco: Never  Vaping Use   Vaping status: Never Used  Substance and Sexual Activity   Alcohol use: No   Drug use: No   Sexual activity: Not on file  Other Topics Concern   Not on file  Social History Narrative   Not on file   Social Drivers of Health   Financial Resource Strain: Low Risk  (02/11/2022)   Overall Financial Resource Strain (CARDIA)    Difficulty of Paying Living Expenses: Not hard at all  Food Insecurity: No Food Insecurity (02/11/2022)   Hunger Vital Sign    Worried About Running Out of Food in the Last Year: Never true    Ran Out of Food in the Last Year: Never true  Transportation Needs: No Transportation Needs (02/11/2022)   PRAPARE - Administrator, Civil Service (Medical): No    Lack of Transportation (Non-Medical): No  Physical Activity: Inactive (02/11/2022)   Exercise Vital Sign    Days of Exercise per Week: 0 days    Minutes of Exercise per Session: 0 min  Stress: No Stress Concern Present (02/11/2022)   [employer] of Occupational Health - Occupational Stress  Questionnaire    Feeling of Stress : Not at all  Social Connections: Moderately Integrated (02/11/2022)   Social Connection and Isolation Panel    Frequency of Communication with Friends and Family: More than three times a week    Frequency of Social Gatherings with Friends and Family: Once a week    Attends Religious Services: More than 4 times per year    Active Member  of Clubs or Organizations: Yes    Attends Banker Meetings: More than 4 times per year    Marital Status: Widowed  Intimate Partner Violence: Not At Risk (02/11/2022)   Humiliation, Afraid, Rape, and Kick questionnaire    Fear of Current or Ex-Partner: No    Emotionally Abused: No    Physically Abused: No    Sexually Abused: No    Outpatient Medications Prior to Visit  Medication Sig Dispense Refill   acetaminophen  (TYLENOL ) 325 MG tablet Take 2 tablets (650 mg total) by mouth every 6 (six) hours as needed for mild pain (or Fever >/= 101).     albuterol  (VENTOLIN  HFA) 108 (90 Base) MCG/ACT inhaler Inhale 1 puff into the lungs every 6 (six) hours as needed for wheezing or shortness of breath. 30 each 2   ALPRAZolam  (XANAX ) 0.25 MG tablet TAKE 1 TABLET BY MOUTH ONCE DAILY AS NEEDED FOR ANXIETY 30 tablet 0   apixaban  (ELIQUIS ) 5 MG TABS tablet Take 1 tablet by mouth twice daily 60 tablet 5   atorvastatin  (LIPITOR) 40 MG tablet TAKE 1 TABLET BY MOUTH ONCE DAILY . APPOINTMENT REQUIRED FOR FUTURE REFILLS 90 tablet 3   busPIRone  (BUSPAR ) 5 MG tablet Take 1 tablet (5 mg total) by mouth 3 (three) times daily. 90 tablet 2   citalopram  (CELEXA ) 20 MG tablet Take 1 tablet (20 mg total) by mouth daily. 90 tablet 1   furosemide  (LASIX ) 20 MG tablet Take 40 mg (two tablets) in the morning, take 20 mg (one tablet) in the afternoon. 270 tablet 3   levothyroxine  (SYNTHROID ) 75 MCG tablet Take 1 tablet (75 mcg total) by mouth daily before breakfast. 90 tablet 1   loratadine  (CLARITIN ) 10 MG tablet Take 10 mg by mouth daily.      Multiple Vitamins-Minerals (CENTRUM SILVER PO) Take 1 tablet by mouth daily.     potassium chloride  SA (KLOR-CON  M) 20 MEQ tablet Take 1 tablet (20 mEq total) by mouth daily. 30 tablet 6   verapamil  (CALAN -SR) 240 MG CR tablet Take 1 tablet (240 mg total) by mouth daily. 90 tablet 1   No facility-administered medications prior to visit.    Allergies  Allergen Reactions   Cefdinir  Dermatitis   Codeine Nausea And Vomiting    ROS See HPI    Objective:    Physical Exam  General: No acute distress. Awake and conversant.  Eyes: Normal conjunctiva, anicteric. Round symmetric pupils.  ENT: Hearing grossly intact. No nasal discharge.  Neck: Neck is supple. No masses or thyromegaly.  Respiratory: CTAB. Respirations are non-labored. No wheezing.  Skin: Warm. No rashes or ulcers.  Psych: Alert and oriented. Cooperative. CV: RRR. No murmur. +1 b/l LE edema MSK:  No clubbing or cyanosis. Normal ambulation. Neuro:  CN II-XII grossly normal.     BP 122/78 (BP Location: Left Arm, Patient Position: Sitting, Cuff Size: Large)   Pulse (!) 116   Temp 97.7 F (36.5 C) (Oral)   Resp 12   Ht 5' 2 (1.575 m)   Wt 169 lb 9.6 oz (76.9 kg)   SpO2 94%   BMI 31.02 kg/m  Wt Readings from Last 3 Encounters:  10/07/23 169 lb 9.6 oz (76.9 kg)  07/29/23 179 lb (81.2 kg)  07/07/23 181 lb (82.1 kg)       Assessment & Plan:   Problem List Items Addressed This Visit     Acute on chronic diastolic CHF (congestive heart failure) (HCC)   Follows with  cardiology. No recent exacerbations. Echocardiogram April 2025 showed ejection fraction 50 to 55% mild left atrial enlargement, moderate tricuspid regurgitation.       Advanced care planning/counseling discussion   Pt states that she does have ACP documents in place.  Encouraged patient to bring copies of ACP documents to next appointment to incorporate in her medical chart.      Anxiety   Stable on current medications.      Atrial fibrillation  (HCC) - Primary   Rate controlled.  Tolerating meds. Follows with Cardiology, Dr. Pietro.      Chronic renal insufficiency   Hydrate and monitor.      Relevant Orders   Comp Met (CMET)   HTN (hypertension)   Well controlled, no changes to meds. Encouraged heart healthy diet such as the DASH diet and exercise as tolerated.       Hyperglycemia   hgba1c acceptable, minimize simple carbs. Increase exercise as tolerated.       Hypothyroidism   On Levothyroxine , continue to monitor         FU 4 months  Portions of this note were dictated using DRAGON voice recognition software. Please disregard any errors in transcription.      I am having Alyssa Williamson maintain her Multiple Vitamins-Minerals (CENTRUM SILVER PO), loratadine , acetaminophen , albuterol , potassium chloride  SA, citalopram , levothyroxine , furosemide , verapamil , busPIRone , atorvastatin , Eliquis , and ALPRAZolam .  No orders of the defined types were placed in this encounter.

## 2023-10-02 NOTE — Assessment & Plan Note (Signed)
 Well controlled, no changes to meds. Encouraged heart healthy diet such as the DASH diet and exercise as tolerated.

## 2023-10-02 NOTE — Assessment & Plan Note (Addendum)
 Rate controlled.  Tolerating meds. Follows with Cardiology, Dr. Pietro.

## 2023-10-02 NOTE — Assessment & Plan Note (Signed)
 On Levothyroxine, continue to monitor

## 2023-10-02 NOTE — Assessment & Plan Note (Signed)
 Hydrate and monitor

## 2023-10-02 NOTE — Assessment & Plan Note (Signed)
 Stable on current medications

## 2023-10-02 NOTE — Assessment & Plan Note (Signed)
 [id number] acceptable, minimize simple carbs. Increase exercise as tolerated.

## 2023-10-07 ENCOUNTER — Encounter: Payer: Self-pay | Admitting: Student

## 2023-10-07 ENCOUNTER — Ambulatory Visit (INDEPENDENT_AMBULATORY_CARE_PROVIDER_SITE_OTHER): Admitting: Student

## 2023-10-07 VITALS — BP 122/78 | HR 116 | Temp 97.7°F | Resp 12 | Ht 62.0 in | Wt 169.6 lb

## 2023-10-07 DIAGNOSIS — E039 Hypothyroidism, unspecified: Secondary | ICD-10-CM

## 2023-10-07 DIAGNOSIS — Z7189 Other specified counseling: Secondary | ICD-10-CM | POA: Insufficient documentation

## 2023-10-07 DIAGNOSIS — I5033 Acute on chronic diastolic (congestive) heart failure: Secondary | ICD-10-CM

## 2023-10-07 DIAGNOSIS — R739 Hyperglycemia, unspecified: Secondary | ICD-10-CM

## 2023-10-07 DIAGNOSIS — I10 Essential (primary) hypertension: Secondary | ICD-10-CM

## 2023-10-07 DIAGNOSIS — N189 Chronic kidney disease, unspecified: Secondary | ICD-10-CM | POA: Diagnosis not present

## 2023-10-07 DIAGNOSIS — F419 Anxiety disorder, unspecified: Secondary | ICD-10-CM

## 2023-10-07 DIAGNOSIS — I4891 Unspecified atrial fibrillation: Secondary | ICD-10-CM | POA: Diagnosis not present

## 2023-10-07 LAB — COMPREHENSIVE METABOLIC PANEL WITH GFR
ALT: 24 U/L (ref 0–35)
AST: 30 U/L (ref 0–37)
Albumin: 3.7 g/dL (ref 3.5–5.2)
Alkaline Phosphatase: 97 U/L (ref 39–117)
BUN: 22 mg/dL (ref 6–23)
CO2: 32 meq/L (ref 19–32)
Calcium: 9.3 mg/dL (ref 8.4–10.5)
Chloride: 99 meq/L (ref 96–112)
Creatinine, Ser: 1.05 mg/dL (ref 0.40–1.20)
GFR: 46.77 mL/min — ABNORMAL LOW (ref >60.00)
Glucose, Bld: 84 mg/dL (ref 70–99)
Potassium: 4.1 meq/L (ref 3.5–5.1)
Sodium: 139 meq/L (ref 135–145)
Total Bilirubin: 1.3 mg/dL — ABNORMAL HIGH (ref 0.2–1.2)
Total Protein: 6.9 g/dL (ref 6.0–8.3)

## 2023-10-07 NOTE — Assessment & Plan Note (Signed)
 Pt states that she does have ACP documents in place.  Encouraged patient to bring copies of ACP documents to next appointment to incorporate in her medical chart.

## 2023-10-07 NOTE — Patient Instructions (Signed)
 Schedule annual wellness visit  at checkout

## 2023-10-09 ENCOUNTER — Ambulatory Visit: Payer: Self-pay | Admitting: Student

## 2023-10-31 ENCOUNTER — Other Ambulatory Visit: Payer: Self-pay | Admitting: Family Medicine

## 2023-10-31 MED ORDER — ALPRAZOLAM 0.25 MG PO TABS: 0.2500 mg | ORAL_TABLET | Freq: Every day | ORAL | 0 refills | Status: DC | PRN

## 2023-10-31 MED ORDER — CITALOPRAM HYDROBROMIDE 20 MG PO TABS: 20.0000 mg | ORAL_TABLET | Freq: Every day | ORAL | 1 refills | Status: AC

## 2023-10-31 NOTE — Telephone Encounter (Unsigned)
 Copied from CRM [id number]. Topic: Clinical - Medication Refill >> Oct 31, 2023 12:16 PM Dedra B wrote: Medication: citalopram  (CELEXA ) 20 MG tablet ALPRAZolam  (XANAX ) 0.25 MG tablet  Has the patient contacted their pharmacy? Yes, out of refills   This is the patient's preferred pharmacy:  [facility] [address] [place], [place] - [address] [place] [address] Phone: [phone] Fax: [phone]  Is this the correct pharmacy for this prescription? Yes  Has the prescription been filled recently? No  Is the patient out of the medication? Yes  Has the patient been seen for an appointment in the last year OR does the patient have an upcoming appointment? Yes  Can we respond through MyChart? No  Agent: Please be advised that Rx refills may take up to 3 business days. We ask that you follow-up with your pharmacy.

## 2023-10-31 NOTE — Telephone Encounter (Signed)
 Requesting: alprazolam  0.25mg   Contract: 08/25/22 UDS: 12/23/22 Last Visit: 10/07/23 w/ Noemi Y Next Visit: None Last Refill: 09/23/23 #30 and 0RF   Please Advise

## 2023-11-02 ENCOUNTER — Other Ambulatory Visit: Payer: Self-pay | Admitting: Family Medicine

## 2023-11-04 ENCOUNTER — Ambulatory Visit

## 2023-11-14 ENCOUNTER — Other Ambulatory Visit: Payer: Self-pay | Admitting: Family Medicine

## 2023-11-14 NOTE — Telephone Encounter (Signed)
 Copied from CRM #[id number]. Topic: Clinical - Medication Refill >> Nov 14, 2023  1:36 PM Ivette P wrote: Medication: albuterol  (VENTOLIN  HFA) 108 (90 Base) MCG/ACT inhaler  Has the patient contacted their pharmacy? Yes (Agent: If no, request that the patient contact the pharmacy for the refill. If patient does not wish to contact the pharmacy document the reason why and proceed with request.) (Agent: If yes, when and what did the pharmacy advise?)  This is the patient's preferred pharmacy:  [facility] [address] [place], [place] - [address] [place] [address] Phone: [phone] Fax: [phone]  Is this the correct pharmacy for this prescription? Yes If no, delete pharmacy and type the correct one.   Has the prescription been filled recently? No  Is the patient out of the medication? Yes  Has the patient been seen for an appointment in the last year OR does the patient have an upcoming appointment? Yes  Can we respond through MyChart? Yes  Agent: Please be advised that Rx refills may take up to 3 business days. We ask that you follow-up with your pharmacy.

## 2023-11-15 MED ORDER — ALBUTEROL SULFATE HFA 108 (90 BASE) MCG/ACT IN AERS: 1.0000 | INHALATION_SPRAY | Freq: Four times a day (QID) | RESPIRATORY_TRACT | 5 refills | Status: AC | PRN

## 2023-11-17 ENCOUNTER — Ambulatory Visit (INDEPENDENT_AMBULATORY_CARE_PROVIDER_SITE_OTHER): Admitting: *Deleted

## 2023-11-17 VITALS — Ht 62.0 in | Wt 171.0 lb

## 2023-11-17 DIAGNOSIS — Z0000 Encounter for general adult medical examination without abnormal findings: Secondary | ICD-10-CM | POA: Diagnosis not present

## 2023-11-17 DIAGNOSIS — M8580 Other specified disorders of bone density and structure, unspecified site: Secondary | ICD-10-CM

## 2023-11-17 DIAGNOSIS — Z780 Asymptomatic menopausal state: Secondary | ICD-10-CM | POA: Diagnosis not present

## 2023-11-17 NOTE — Patient Instructions (Addendum)
 [name] , Thank you for taking time out of your busy schedule to complete your Annual Wellness Visit with me. I enjoyed our conversation and look forward to speaking with you again next year. I, as well as your care team,  appreciate your ongoing commitment to your health goals. Please review the following plan we discussed and let me know if I can assist you in the future. Your Game plan/ To Do List   Referrals: If you haven't heard from the office you've been referred to, please reach out to them at the phone provided.   [facility] ([facility]):  [phone]  [facility] (Please call and schedule you next exam): [phone]  Follow up Visits: Next Medicare AWV with our clinical staff: 11/22/24 3pm, telephone    Next Office Visit with your provider: 03/09/24 11am, [name]  Clinician Recommendations:  Aim for 30 minutes of exercise or brisk walking, 6-8 glasses of water, and 5 servings of fruits and vegetables each day.   You will need to get the following vaccines at your local pharmacy:  Flu and pneumonia       This is a list of the screening recommended for you and due dates:  Health Maintenance  Topic Date Due   Medicare Annual Wellness Visit  02/12/2023   Flu Shot  09/23/2023   COVID-19 Vaccine (8 - 2025-26 season) 11/16/2024*   Zoster (Shingles) Vaccine (1 of 2) 11/16/2024*   DTaP/Tdap/Td vaccine (3 - Td or Tdap) 11/30/2032   Pneumococcal Vaccine for age over 48  Completed   DEXA scan (bone density measurement)  Completed   HPV Vaccine  Aged Out   Meningitis B Vaccine  Aged Out  *Topic was postponed. The date shown is not the original due date.    Advanced directives: (Copy Requested) Please bring a copy of your health care power of attorney and living will to the office to be added to your chart at your convenience. You can mail to [facility] [address] [place], [place] [address] or email to  [email or web address] Advance Care Planning is important because it:  [x]  Makes sure you receive the medical care that is consistent with your values, goals, and preferences  [x]  It provides guidance to your family and loved ones and reduces their decisional burden about whether or not they are making the right decisions based on your wishes.  Follow the link provided in your after visit summary or read over the paperwork we have mailed to you to help you started getting your Advance Directives in place. If you need assistance in completing these, please reach out to us  so that we can help you!  See attachments for Preventive Care and Fall Prevention Tips.

## 2023-11-17 NOTE — Progress Notes (Signed)
 Subjective:   Alyssa Williamson is a 89 y.o. who presents for a Medicare Wellness preventive visit.  As a reminder, Annual Wellness Visits don't include a physical exam, and some assessments may be limited, especially if this visit is performed virtually. We may recommend an in-person follow-up visit with your provider if needed.  Visit Complete: Virtual I connected with  Alyssa Williamson on 11/17/23 by a audio enabled telemedicine application and verified that I am speaking with the correct person using two identifiers.  Patient Location: Home  Provider Location: Office/Clinic  I discussed the limitations of evaluation and management by telemedicine. The patient expressed understanding and agreed to proceed.  Vital Signs: Because this visit was a virtual/telehealth visit, some criteria may be missing or patient reported. Any vitals not documented were not able to be obtained and vitals that have been documented are patient reported.  VideoDeclined- This patient declined Librarian, academic. Therefore the visit was completed with audio only.  Persons Participating in Visit: Patient.  AWV Questionnaire: No: Patient Medicare AWV questionnaire was not completed prior to this visit.  Cardiac Risk Factors include: advanced age (>57men, >71 women)     Objective:    Today's Vitals   11/17/23 1506  Weight: 171 lb (77.6 kg)  Height: 5' 2 (1.575 m)   Body mass index is 31.28 kg/m.     11/17/2023    3:17 PM 10/23/2022   10:55 AM 02/11/2022    3:03 PM 07/19/2020    9:00 PM 07/16/2020    9:18 AM 07/14/2020    3:19 PM 07/14/2020    8:10 AM  Advanced Directives  Does Patient Have a Medical Advance Directive? Yes No Yes Yes Yes No No  Type of Advance Directive Living will  Healthcare Power of [place];Living will Healthcare Power of [place];Living will Healthcare Power of [place];Living will    Does patient want to make changes to medical advance directive? No -  Patient declined  No - Patient declined No - Patient declined     Copy of Healthcare Power of Attorney in Chart?   No - copy requested  No - copy requested    Would patient like information on creating a medical advance directive?  No - Patient declined   No - Patient declined No - Patient declined     Current Medications (verified) Outpatient Encounter Medications as of 11/17/2023  Medication Sig   acetaminophen  (TYLENOL ) 325 MG tablet Take 2 tablets (650 mg total) by mouth every 6 (six) hours as needed for mild pain (or Fever >/= 101).   albuterol  (VENTOLIN  HFA) 108 (90 Base) MCG/ACT inhaler Inhale 1 puff into the lungs every 6 (six) hours as needed for wheezing or shortness of breath.   ALPRAZolam  (XANAX ) 0.25 MG tablet Take 1 tablet (0.25 mg total) by mouth daily as needed for anxiety.   apixaban  (ELIQUIS ) 5 MG TABS tablet Take 1 tablet by mouth twice daily   atorvastatin  (LIPITOR) 40 MG tablet TAKE 1 TABLET BY MOUTH ONCE DAILY . APPOINTMENT REQUIRED FOR FUTURE REFILLS   busPIRone  (BUSPAR ) 5 MG tablet Take 1 tablet (5 mg total) by mouth 3 (three) times daily.   citalopram  (CELEXA ) 20 MG tablet Take 1 tablet (20 mg total) by mouth daily.   furosemide  (LASIX ) 20 MG tablet Take 40 mg (two tablets) in the morning, take 20 mg (one tablet) in the afternoon.   levothyroxine  (SYNTHROID ) 75 MCG tablet Take 1 tablet (75 mcg total) by mouth daily  before breakfast.   loratadine  (CLARITIN ) 10 MG tablet Take 10 mg by mouth daily.   Multiple Vitamins-Minerals (CENTRUM SILVER PO) Take 1 tablet by mouth daily.   potassium chloride  SA (KLOR-CON  M) 20 MEQ tablet Take 1 tablet (20 mEq total) by mouth daily.   verapamil  (CALAN -SR) 240 MG CR tablet Take 1 tablet (240 mg total) by mouth daily.   No facility-administered encounter medications on file as of 11/17/2023.    Allergies (verified) Cefdinir  and Codeine   History: Past Medical History:  Diagnosis Date   Allergic rhinitis    Anemia    Anxiety     Arthritis    bilateral knee   Atrial fibrillation (HCC) 12/21/2016   Cataract 2025   left eye, being monitored   Cervical cancer screening 10/30/2013   Menarche at 15 Regular and moderate flow No history of abnormal pap in past G4P3, s/p 3 svd and 1 MC No history of abnormal MGM Noconcerns today No gyn surgeries   Constipation 11/18/2015   Depression with anxiety    Diverticulitis    Heart murmur    Hyperlipidemia    Hypertension    Hypothyroid    Left knee DJD    Medicare annual wellness visit, subsequent 10/30/2013   PONV (postoperative nausea and vomiting)    Preventative health care 05/20/2016   Past Surgical History:  Procedure Laterality Date   ABDOMINAL HYSTERECTOMY     bladder tack  04/23/2010   Dr Evalene Nigh   CATARACT EXTRACTION Right 2025   ERCP N/A 07/16/2020   Procedure: ENDOSCOPIC RETROGRADE CHOLANGIOPANCREATOGRAPHY (ERCP);  Surgeon: Saintclair Jasper, MD;  Location: [facility];  Service: Gastroenterology;  Laterality: N/A;   GANGLION CYST EXCISION     JOINT REPLACEMENT Right    REMOVAL OF STONES  07/16/2020   Procedure: REMOVAL OF STONES;  Surgeon: Saintclair Jasper, MD;  Location: [facility];  Service: Gastroenterology;;   REPLACEMENT TOTAL KNEE  10/24/2010   right knee-Murphy    SPHINCTEROTOMY  07/16/2020   Procedure: SPHINCTEROTOMY;  Surgeon: Saintclair Jasper, MD;  Location: [facility];  Service: Gastroenterology;;   TONSILLECTOMY     TOTAL KNEE ARTHROPLASTY Left 07/26/2012   Procedure: TOTAL KNEE ARTHROPLASTY;  Surgeon: Toribio JULIANNA Chancy, MD;  Location: [facility];  Service: Orthopedics;  Laterality: Left;   Family History  Problem Relation Age of Onset   Alcohol abuse Father    Breast cancer Sister    Cancer Sister        bladder   Glaucoma Sister    Arthritis Daughter    Diabetes Daughter        type 2   Cancer Daughter        brain cancer, diagnosed 29 years.agp   Mental illness Daughter        s/p craniotomies, gamma knife for tumors. numerous shunts.    Heart failure Mother    Cancer Brother        glioblastoma    Glaucoma Brother    Diverticulosis Sister    Cancer Sister        liver   Cancer Brother        melanoma, mets to lung and brain and intestine   Alcohol abuse Brother    Cancer Brother        liver   Glaucoma Maternal Grandfather    Cancer Sister        breast   Colon cancer Neg Hx    Hypertension Neg Hx    Social History  Socioeconomic History   Marital status: Widowed    Spouse name: Not on file   Number of children: 3   Years of education: Not on file   Highest education level: Not on file  Occupational History   Not on file  Tobacco Use   Smoking status: Never   Smokeless tobacco: Never  Vaping Use   Vaping status: Never Used  Substance and Sexual Activity   Alcohol use: Yes    Comment: 1 glass of wine weekly   Drug use: No   Sexual activity: Not on file  Other Topics Concern   Not on file  Social History Narrative   Not on file   Social Drivers of Health   Financial Resource Strain: Low Risk  (11/17/2023)   Overall Financial Resource Strain (CARDIA)    Difficulty of Paying Living Expenses: Not very hard  Food Insecurity: No Food Insecurity (11/17/2023)   Hunger Vital Sign    Worried About Running Out of Food in the Last Year: Never true    Ran Out of Food in the Last Year: Never true  Transportation Needs: No Transportation Needs (11/17/2023)   PRAPARE - Administrator, Civil Service (Medical): No    Lack of Transportation (Non-Medical): No  Physical Activity: Insufficiently Active (11/17/2023)   Exercise Vital Sign    Days of Exercise per Week: 6 days    Minutes of Exercise per Session: 20 min  Stress: No Stress Concern Present (11/17/2023)   [employer] of Occupational Health - Occupational Stress Questionnaire    Feeling of Stress: Not at all  Social Connections: Moderately Integrated (11/17/2023)   Social Connection and Isolation Panel    Frequency of Communication with  Friends and Family: More than three times a week    Frequency of Social Gatherings with Friends and Family: Once a week    Attends Religious Services: More than 4 times per year    Active Member of [employer] or Organizations: Yes    Attends Banker Meetings: More than 4 times per year    Marital Status: Widowed    Tobacco Counseling Counseling given: Not Answered    Clinical Intake:  Pre-visit preparation completed: Yes  Pain : No/denies pain     BMI - recorded: 31.28 Nutritional Status: BMI > 30  Obese Nutritional Risks: None Diabetes: No  Lab Results  Component Value Date   HGBA1C 5.8 07/07/2023   HGBA1C 6.3 12/23/2022   HGBA1C 6.3 08/12/2022     How often do you need to have someone help you when you read instructions, pamphlets, or other written materials from your doctor or pharmacy?: 1 - Never  Interpreter Needed?: No  Information entered by :: Adreyan Carbajal, CMA(AAMA)   Activities of Daily Living     11/17/2023    3:11 PM  In your present state of health, do you have any difficulty performing the following activities:  Hearing? 0  Vision? 0  Difficulty concentrating or making decisions? 0  Walking or climbing stairs? 0  Dressing or bathing? 0  Doing errands, shopping? 0  Preparing Food and eating ? N  Using the Toilet? N  In the past six months, have you accidently leaked urine? Y  Comment only at night and wears a pad  Do you have problems with loss of bowel control? N  Managing your Medications? N  Managing your Finances? N  Housekeeping or managing your Housekeeping? N    Patient Care Team: Domenica Blackbird  A, MD as PCP - General (Family Medicine) Pietro, Redell RAMAN, MD as PCP - Cardiology (Cardiology) Aleene Kitchens, MD (Inactive) as Consulting Physician (Urology) Reinaldo Pleasant Franchot DEVONNA (Dermatology) Pietro Redell RAMAN, MD as Consulting Physician (Cardiology) [facility]  I have updated your Care Teams any  recent Medical Services you may have received from other providers in the past year.     Assessment:   This is a routine wellness examination for Alyssa Williamson.  Hearing/Vision screen Vision Screening - Comments:: Will call to schedule eye exam. [facility]   Goals Addressed   None    Depression Screen     11/17/2023    3:14 PM 10/07/2023   10:49 AM 08/12/2022   11:13 AM 02/11/2022    3:02 PM 02/09/2022   10:06 AM 08/04/2021   10:23 AM 07/08/2020    9:01 AM  PHQ 2/9 Scores  PHQ - 2 Score 1 1 0 0 0 0 1  PHQ- 9 Score 1 1   0 0 1    Fall Risk     11/17/2023    3:09 PM 10/07/2023   10:49 AM 08/12/2022   11:13 AM 02/11/2022    3:02 PM 02/09/2022   10:06 AM  Fall Risk   Falls in the past year? 0 0 0 1 1  Number falls in past yr: 0 0 0 0 1  Injury with Fall? 0  0 1 1  Risk for fall due to : Impaired balance/gait   History of fall(s)   Follow up Education provided Falls evaluation completed Falls evaluation completed Falls evaluation completed  Falls evaluation completed      Data saved with a previous flowsheet row definition    MEDICARE RISK AT HOME:  Medicare Risk at Home Any stairs in or around the home?: No If so, are there any without handrails?: No Home free of loose throw rugs in walkways, pet beds, electrical cords, etc?: Yes Adequate lighting in your home to reduce risk of falls?: No Life alert?: Yes Use of a cane, walker or w/c?: No Grab bars in the bathroom?: Yes Shower chair or bench in shower?: Yes (doesn't need it) Elevated toilet seat or a handicapped toilet?: Yes  TIMED UP AND GO:  Was the test performed?  No,audio  Cognitive Function: 6CIT completed        11/17/2023    3:18 PM 02/11/2022    3:13 PM  6CIT Screen  What Year? 0 points 0 points  What month? 0 points 0 points  What time? 0 points 0 points  Count back from 20 0 points 2 points  Months in reverse 0 points 0 points  Repeat phrase 2 points 0 points  Total Score 2 points 2 points     Immunizations Immunization History  Administered Date(s) Administered   Fluad Quad(high Dose 65+) 11/13/2018, 12/25/2019, 01/29/2021, 12/22/2021   INFLUENZA, HIGH DOSE SEASONAL PF 11/18/2015, 12/27/2016, 01/05/2018, 11/13/2018, 12/25/2019, 01/29/2021, 12/01/2022, 03/31/2023   Influenza Split 11/12/2011, 11/17/2012   Influenza Whole 11/17/2012   Influenza,inj,Quad PF,6+ Mos 10/30/2013   Influenza-Unspecified 11/12/2011, 12/24/2014   PFIZER Comirnaty(Gray Top)Covid-19 Tri-Sucrose Vaccine 03/15/2019, 05/07/2019, 11/26/2019, 06/26/2020   PFIZER(Purple Top)SARS-COV-2 Vaccination 03/15/2019, 05/07/2019, 11/26/2019   Pneumococcal Conjugate-13 10/30/2013   Pneumococcal Polysaccharide-23 02/10/2015   Respiratory Syncytial Virus Vaccine,Recomb Aduvanted(Arexvy) 01/25/2022, 10/04/2022   Tdap 09/04/2012, 12/01/2022    Screening Tests Health Maintenance  Topic Date Due   Influenza Vaccine  09/23/2023   COVID-19 Vaccine (8 - 2025-26 season) 11/16/2024 (  Originally 10/24/2023)   Zoster Vaccines- Shingrix (1 of 2) 11/16/2024 (Originally 03/24/1950)   Medicare Annual Wellness (AWV)  11/16/2024   DTaP/Tdap/Td (3 - Td or Tdap) 11/30/2032   Pneumococcal Vaccine: 50+ Years  Completed   DEXA SCAN  Completed   HPV VACCINES  Aged Out   Meningococcal B Vaccine  Aged Out    Health Maintenance Items Addressed: Pt will get flu and pneumonia vaccines at pharmacy.  DEXA ordered today.  Additional Screening:  Vision Screening: Recommended annual ophthalmology exams for early detection of glaucoma and other disorders of the eye. Is the patient up to date with their annual eye exam?  No  Who is the provider or what is the name of the office in which the patient attends annual eye exams? [facility] and will call to schedule.  Dental Screening: Recommended annual dental exams for proper oral hygiene  Community Resource Referral / Chronic Care Management: CRR required this visit?  No   CCM required  this visit?  No   Plan:    I have personally reviewed and noted the following in the patient's chart:   Medical and social history Use of alcohol, tobacco or illicit drugs  Current medications and supplements including opioid prescriptions. Patient is not currently taking opioid prescriptions. Functional ability and status Nutritional status Physical activity Advanced directives List of other physicians Hospitalizations, surgeries, and ER visits in previous 12 months Vitals Screenings to include cognitive, depression, and falls Referrals and appointments  In addition, I have reviewed and discussed with patient certain preventive protocols, quality metrics, and best practice recommendations. A written personalized care plan for preventive services as well as general preventive health recommendations were provided to patient.   Lolita Libra, CMA   11/17/2023   After Visit Summary: (MyChart) Due to this being a telephonic visit, the after visit summary with patients personalized plan was offered to patient via MyChart   Notes: Nothing significant to report at this time.

## 2023-11-18 ENCOUNTER — Telehealth: Payer: Self-pay | Admitting: Cardiology

## 2023-11-18 NOTE — Telephone Encounter (Signed)
 Spoke with pt, aware of the recommendations and she reports she is feeling better today. Aware script for paxlovid  will be cancelled at the pharmacy. Spoke with tyler, aware to cancel script.

## 2023-11-18 NOTE — Telephone Encounter (Signed)
 Pt c/o medication issue:  1. Name of Medication: apixaban  (ELIQUIS ) 5 MG TABS tablet   2. How are you currently taking this medication (dosage and times per day)? As written   3. Are you having a reaction (difficulty breathing--STAT)? No   4. What is your medication issue? Tyler with [facility] pharmacy called in on behalf of pt. Pt has covid and went to an [facility]. They prescribed her Paxlovid  however pharmacy received a flag with Eliquis . He states he reached back out to them but they told him to reach out to prescriber for eliquis .   He states pt has had covid since Monday and has almost reached the point in which the Paxlovid  would would not be effective anymore. He asked for advice on if he should fill for her or not. Please advise.

## 2023-12-12 ENCOUNTER — Other Ambulatory Visit: Payer: Self-pay | Admitting: Family Medicine

## 2023-12-12 MED ORDER — ALPRAZOLAM 0.25 MG PO TABS: 0.2500 mg | ORAL_TABLET | Freq: Every day | ORAL | 0 refills | Status: DC | PRN

## 2023-12-12 NOTE — Telephone Encounter (Signed)
 Requesting: alprazolam  0.25mg   Contract:  08/25/22 UDS: 12/23/22 Last Visit: 10/07/23 w/ Wheeler  Next Visit: 03/09/24 w/ Wheeler Last Refill: 10/31/23 #30 and 0RF   Please Advise

## 2023-12-12 NOTE — Telephone Encounter (Unsigned)
 Copied from CRM #[id number]. Topic: Clinical - Medication Refill >> Dec 12, 2023 10:19 AM Drema MATSU wrote: Medication: ALPRAZolam  (XANAX ) 0.25 MG tablet  Has the patient contacted their pharmacy? Yes (Agent: If no, request that the patient contact the pharmacy for the refill. If patient does not wish to contact the pharmacy document the reason why and proceed with request.) no more refills (Agent: If yes, when and what did the pharmacy advise?)  This is the patient's preferred pharmacy:  [facility] [address] [place], [place] - [address] [place] [address] Phone: [phone] Fax: [phone]  Is this the correct pharmacy for this prescription? Yes If no, delete pharmacy and type the correct one.   Has the prescription been filled recently? Yes  Is the patient out of the medication? Yes  Has the patient been seen for an appointment in the last year OR does the patient have an upcoming appointment? Yes  Can we respond through MyChart? Yes  Agent: Please be advised that Rx refills may take up to 3 business days. We ask that you follow-up with your pharmacy.

## 2023-12-28 ENCOUNTER — Other Ambulatory Visit: Payer: Self-pay | Admitting: Family Medicine

## 2024-01-23 ENCOUNTER — Other Ambulatory Visit: Payer: Self-pay | Admitting: Family

## 2024-01-25 ENCOUNTER — Telehealth: Payer: Self-pay | Admitting: Family Medicine

## 2024-01-25 NOTE — Telephone Encounter (Unsigned)
 Copied from CRM #[id number]. Topic: Clinical - Medication Refill >> Jan 25, 2024 10:38 AM [name] wrote: Medication:  ALPRAZolam  (XANAX ) 0.25 MG tablet    Has the patient contacted their pharmacy? Yes, requested 12/1 and 12/3  This is the patient's preferred pharmacy:  [facility] [address] [place], [place] - [address] [place] [address] Phone: [phone] Fax: [phone]  Is this the correct pharmacy for this prescription? Yes If no, delete pharmacy and type the correct one.   Has the prescription been filled recently? Yes  Is the patient out of the medication? Yes  Has the patient been seen for an appointment in the last year OR does the patient have an upcoming appointment? Yes  Can we respond through MyChart? No  Agent: Please be advised that Rx refills may take up to 3 business days. We ask that you follow-up with your pharmacy.

## 2024-01-26 NOTE — Telephone Encounter (Signed)
 Requesting: alprazolam  0.25mg   Contract: 08/25/22 UDS: 12/23/22 Last Visit: 10/07/23 w/ Harlene GRADE Next Visit: 03/09/24 Last Refill: 12/12/23 #30 and 0RF   Please Advise

## 2024-01-27 MED ORDER — ALPRAZOLAM 0.25 MG PO TABS: 0.2500 mg | ORAL_TABLET | Freq: Every day | ORAL | 0 refills | Status: DC | PRN

## 2024-01-27 NOTE — Telephone Encounter (Unsigned)
 Copied from CRM #[id number]. Topic: Clinical - Prescription Issue >> Jan 27, 2024 11:37 AM Alyssa Williamson wrote: Reason for CRM: Pt requested a call back at [phone] to discuss the status of her prescription refill for ALPRAZolam  (XANAX ) 0.25 MG tablet.

## 2024-03-07 ENCOUNTER — Other Ambulatory Visit: Payer: Self-pay | Admitting: Family

## 2024-03-07 NOTE — Telephone Encounter (Signed)
 Requesting: alprazolam  0.25mg  Contract: 08/25/22 UDS: 12/23/22 Last Visit: 10/07/23 w/ Noemi Y Next Visit: 03/09/24 Last Refill: 01/27/24 #30 and 0RF   Please Advise

## 2024-03-09 ENCOUNTER — Ambulatory Visit: Admitting: Student

## 2024-11-22 ENCOUNTER — Ambulatory Visit
